# Patient Record
Sex: Female | Born: 1950 | Race: White | Hispanic: No | Marital: Married | Smoking: Never smoker
Health system: Southern US, Community
[De-identification: ages and names within clinical notes are randomized; demographics above are authoritative.]

## PROBLEM LIST (undated history)

## (undated) DIAGNOSIS — G8929 Other chronic pain: Secondary | ICD-10-CM

## (undated) DIAGNOSIS — J45909 Unspecified asthma, uncomplicated: Secondary | ICD-10-CM

## (undated) DIAGNOSIS — N3281 Overactive bladder: Secondary | ICD-10-CM

## (undated) DIAGNOSIS — J302 Other seasonal allergic rhinitis: Secondary | ICD-10-CM

## (undated) DIAGNOSIS — G4700 Insomnia, unspecified: Secondary | ICD-10-CM

## (undated) DIAGNOSIS — E119 Type 2 diabetes mellitus without complications: Secondary | ICD-10-CM

## (undated) DIAGNOSIS — M549 Dorsalgia, unspecified: Secondary | ICD-10-CM

## (undated) DIAGNOSIS — I10 Essential (primary) hypertension: Secondary | ICD-10-CM

## (undated) DIAGNOSIS — E785 Hyperlipidemia, unspecified: Secondary | ICD-10-CM

## (undated) DIAGNOSIS — K219 Gastro-esophageal reflux disease without esophagitis: Secondary | ICD-10-CM

## (undated) DIAGNOSIS — G4730 Sleep apnea, unspecified: Secondary | ICD-10-CM

## (undated) DIAGNOSIS — E039 Hypothyroidism, unspecified: Secondary | ICD-10-CM

## (undated) HISTORY — PX: COLONOSCOPY: SHX174

## (undated) HISTORY — PX: DILATION AND CURETTAGE OF UTERUS: SHX78

## (undated) HISTORY — PX: CARPAL TUNNEL RELEASE: SHX101

## (undated) HISTORY — PX: SINOSCOPY: SHX187

## (undated) HISTORY — PX: TUBAL LIGATION: SHX77

## (undated) HISTORY — PX: APPENDECTOMY: SHX54

## (undated) HISTORY — PX: TONSILLECTOMY: SUR1361

---

## 1998-05-27 ENCOUNTER — Other Ambulatory Visit: Admission: RE | Admit: 1998-05-27 | Discharge: 1998-05-27 | Payer: Self-pay | Admitting: Obstetrics and Gynecology

## 2001-07-06 HISTORY — PX: ANKLE ARTHROTOMY: SHX879

## 2002-02-25 ENCOUNTER — Encounter: Payer: Self-pay | Admitting: Orthopedic Surgery

## 2002-02-25 ENCOUNTER — Inpatient Hospital Stay (HOSPITAL_COMMUNITY): Admission: EM | Admit: 2002-02-25 | Discharge: 2002-02-27 | Payer: Self-pay | Admitting: Emergency Medicine

## 2002-02-25 ENCOUNTER — Encounter: Payer: Self-pay | Admitting: Emergency Medicine

## 2003-01-11 ENCOUNTER — Ambulatory Visit (HOSPITAL_BASED_OUTPATIENT_CLINIC_OR_DEPARTMENT_OTHER): Admission: RE | Admit: 2003-01-11 | Discharge: 2003-01-11 | Payer: Self-pay | Admitting: Orthopedic Surgery

## 2003-03-05 ENCOUNTER — Ambulatory Visit (HOSPITAL_COMMUNITY): Admission: RE | Admit: 2003-03-05 | Discharge: 2003-03-05 | Payer: Self-pay | Admitting: Gastroenterology

## 2003-10-31 ENCOUNTER — Other Ambulatory Visit: Admission: RE | Admit: 2003-10-31 | Discharge: 2003-10-31 | Payer: Self-pay | Admitting: Family Medicine

## 2004-04-16 ENCOUNTER — Encounter (INDEPENDENT_AMBULATORY_CARE_PROVIDER_SITE_OTHER): Payer: Self-pay | Admitting: *Deleted

## 2004-04-16 ENCOUNTER — Ambulatory Visit (HOSPITAL_COMMUNITY): Admission: RE | Admit: 2004-04-16 | Discharge: 2004-04-16 | Payer: Self-pay | Admitting: Obstetrics and Gynecology

## 2005-02-10 ENCOUNTER — Other Ambulatory Visit: Admission: RE | Admit: 2005-02-10 | Discharge: 2005-02-10 | Payer: Self-pay | Admitting: Family Medicine

## 2007-06-27 ENCOUNTER — Encounter: Payer: Self-pay | Admitting: Pulmonary Disease

## 2007-06-27 ENCOUNTER — Encounter: Admission: RE | Admit: 2007-06-27 | Discharge: 2007-06-27 | Payer: Self-pay | Admitting: Family Medicine

## 2007-11-18 ENCOUNTER — Ambulatory Visit: Payer: Self-pay | Admitting: Pulmonary Disease

## 2007-11-18 DIAGNOSIS — J309 Allergic rhinitis, unspecified: Secondary | ICD-10-CM | POA: Insufficient documentation

## 2007-11-18 DIAGNOSIS — I10 Essential (primary) hypertension: Secondary | ICD-10-CM | POA: Insufficient documentation

## 2007-11-18 DIAGNOSIS — R05 Cough: Secondary | ICD-10-CM | POA: Insufficient documentation

## 2007-11-18 DIAGNOSIS — R059 Cough, unspecified: Secondary | ICD-10-CM | POA: Insufficient documentation

## 2007-11-18 DIAGNOSIS — E785 Hyperlipidemia, unspecified: Secondary | ICD-10-CM | POA: Insufficient documentation

## 2007-12-06 ENCOUNTER — Telehealth: Payer: Self-pay | Admitting: Pulmonary Disease

## 2007-12-23 ENCOUNTER — Ambulatory Visit: Payer: Self-pay | Admitting: Pulmonary Disease

## 2008-07-01 ENCOUNTER — Encounter: Admission: RE | Admit: 2008-07-01 | Discharge: 2008-07-01 | Payer: Self-pay | Admitting: Family Medicine

## 2008-07-02 ENCOUNTER — Encounter: Admission: RE | Admit: 2008-07-02 | Discharge: 2008-07-04 | Payer: Self-pay | Admitting: Family Medicine

## 2008-07-06 HISTORY — PX: BACK SURGERY: SHX140

## 2008-07-09 ENCOUNTER — Encounter: Admission: RE | Admit: 2008-07-09 | Discharge: 2008-10-07 | Payer: Self-pay | Admitting: Family Medicine

## 2008-08-17 ENCOUNTER — Encounter: Admission: RE | Admit: 2008-08-17 | Discharge: 2008-08-17 | Payer: Self-pay | Admitting: Neurosurgery

## 2008-09-07 ENCOUNTER — Encounter: Admission: RE | Admit: 2008-09-07 | Discharge: 2008-09-07 | Payer: Self-pay | Admitting: Neurosurgery

## 2008-10-12 ENCOUNTER — Other Ambulatory Visit: Admission: RE | Admit: 2008-10-12 | Discharge: 2008-10-12 | Payer: Self-pay | Admitting: Obstetrics and Gynecology

## 2008-10-26 ENCOUNTER — Encounter: Admission: RE | Admit: 2008-10-26 | Discharge: 2008-10-26 | Payer: Self-pay | Admitting: Neurosurgery

## 2008-12-17 ENCOUNTER — Encounter: Payer: Self-pay | Admitting: Obstetrics and Gynecology

## 2008-12-17 ENCOUNTER — Ambulatory Visit (HOSPITAL_COMMUNITY): Admission: RE | Admit: 2008-12-17 | Discharge: 2008-12-17 | Payer: Self-pay | Admitting: Obstetrics and Gynecology

## 2009-03-01 ENCOUNTER — Ambulatory Visit (HOSPITAL_COMMUNITY): Admission: RE | Admit: 2009-03-01 | Discharge: 2009-03-02 | Payer: Self-pay | Admitting: Neurosurgery

## 2009-05-23 ENCOUNTER — Encounter: Admission: RE | Admit: 2009-05-23 | Discharge: 2009-05-23 | Payer: Self-pay | Admitting: Family Medicine

## 2010-07-27 ENCOUNTER — Encounter: Payer: Self-pay | Admitting: Neurosurgery

## 2010-10-11 LAB — PROTIME-INR: INR: 0.9 (ref 0.00–1.49)

## 2010-10-11 LAB — CBC
HCT: 42.7 % (ref 36.0–46.0)
MCHC: 34.2 g/dL (ref 30.0–36.0)
MCV: 88.7 fL (ref 78.0–100.0)
Platelets: 295 10*3/uL (ref 150–400)
RDW: 13 % (ref 11.5–15.5)

## 2010-10-11 LAB — URINALYSIS, ROUTINE W REFLEX MICROSCOPIC
Ketones, ur: NEGATIVE mg/dL
Nitrite: NEGATIVE
Specific Gravity, Urine: 1.01 (ref 1.005–1.030)
Urobilinogen, UA: 0.2 mg/dL (ref 0.0–1.0)
pH: 7 (ref 5.0–8.0)

## 2010-10-11 LAB — COMPREHENSIVE METABOLIC PANEL
Albumin: 4.1 g/dL (ref 3.5–5.2)
BUN: 8 mg/dL (ref 6–23)
Calcium: 10 mg/dL (ref 8.4–10.5)
Creatinine, Ser: 0.66 mg/dL (ref 0.4–1.2)
Total Protein: 7 g/dL (ref 6.0–8.3)

## 2010-10-11 LAB — DIFFERENTIAL
Lymphocytes Relative: 37 % (ref 12–46)
Monocytes Absolute: 0.4 10*3/uL (ref 0.1–1.0)
Monocytes Relative: 5 % (ref 3–12)
Neutro Abs: 4.3 10*3/uL (ref 1.7–7.7)

## 2010-10-11 LAB — APTT: aPTT: 25 seconds (ref 24–37)

## 2010-10-13 LAB — CBC
Hemoglobin: 14.1 g/dL (ref 12.0–15.0)
RBC: 4.55 MIL/uL (ref 3.87–5.11)
RDW: 14.6 % (ref 11.5–15.5)

## 2010-11-18 NOTE — H&P (Signed)
[name], [name]                ACCOUNT NO.:  [id number]   MEDICAL RECORD NO.:  [id number]          PATIENT TYPE:  OIB   LOCATION:  3002                         FACILITY:  [facility]   PHYSICIAN:  Payton Doughty, M.D.      DATE OF BIRTH:  01/16/1951   DATE OF ADMISSION:  03/01/2009  DATE OF DISCHARGE:                              HISTORY & PHYSICAL   ADMITTING DIAGNOSIS:  Foraminal disk on the left side at L5-S1.   BODY OF TEXT:  A very nice 60 year old right-handed white lady who has  had pain in her back down the left foot for about a year, it has been  getting worse.  MR showed a foraminal disk at 5-1.  She underwent an  epidural steroid injection, that did not help her very much.  She is  tired of fooling with it and wants her disk out.   Medical history is remarkable for hypertension and allergies.   She takes Norvasc, hydrochlorothiazide, Singulair, simvastatin, Detrol,  potassium, Lovaza, Ambien, Robaxin, Vicodin, etodolac, and a  multivitamin and has no allergies.   SURGICAL HISTORY:  1. Tonsils.  2. Appendectomy.  3. C-section.  4. Right carpal tunnel release.  5. ORIF of the right ankle.   FAMILY HISTORY:  Mom at 37 in good health.  Dad is deceased.  There is a  history of hypertension, stroke, diabetes, degenerative disk disease,  and heart disease in the family.   SOCIAL HISTORY:  She does not smoke, is a social drinker, and works as a  Engineer, site for [employer].   REVIEW OF SYSTEMS:  Remarkable for hypertension, nasal allergies,  hypercholesterolemia, leg pain, left ankle fracture, and leg weakness on  the left.   PHYSICAL EXAMINATION:  HEENT:  Within normal limits.  NECK:  She has reasonable range of motion in her neck.  CHEST:  Clear.  CARDIAC:  Regular rate and rhythm.  ABDOMEN:  Nontender with no hepatosplenomegaly.  EXTREMITIES:  Without clubbing or cyanosis.  Peripheral pulses are good.  GU:  Deferred.  NEUROLOGIC:  She is awake, alert and  oriented.  Cranial nerves are  intact.  Motor exam shows 5/5 strength throughout the upper and lower  extremities.  Sensory dysesthesia described in her right L5  distribution.  Reflexes are 2 at the knees, 2 at the left ankle, 1 at  the right.  Straight leg raise is negative.   MR shows disk at 3-4 centrally with some biforaminal narrowing and  foraminal disk at 5-1 with effacement of left L5 root.   CLINICAL IMPRESSION:  Left L5 radiculopathy related to foraminal disk at  5-1.   PLAN:  Foraminal diskectomy.  The risks and benefits have been discussed  with her, and she wished to proceed.           ______________________________  Payton Doughty, M.D.     MWR/MEDQ  D:  03/01/2009  T:  03/02/2009  Job:  [id number]

## 2010-11-18 NOTE — Op Note (Signed)
NAMEVICCI, REDER                ACCOUNT NO.:  [id number]   MEDICAL RECORD NO.:  [id number]          PATIENT TYPE:  OIB   LOCATION:  3002                         FACILITY:  [facility]   PHYSICIAN:  Payton Doughty, M.D.      DATE OF BIRTH:  08-Jan-1951   DATE OF PROCEDURE:  03/01/2009  DATE OF DISCHARGE:                               OPERATIVE REPORT   PREOPERATIVE DIAGNOSIS:  Foraminal disk on the left side at L5-S1.   POSTOPERATIVE DIAGNOSIS:  Foraminal disk on the left side at L5-S1.   OPERATIVE PROCEDURE:  Exploration of the lateral foramen at L5-S1 on the  left side.   DICTATING DOCTOR:  Payton Doughty, MD, Neurosurgery.   ANESTHESIA:  General endotracheal.   PREPARATION:  Prepped and draped with alcohol wipe.   COMPLICATIONS:  None.   NURSE ASSISTANCE:  Kathleene Hazel, RN   DOCTOR ASSISTANCE:  Hewitt Shorts, MD   BODY OF TEXT:  A 60 year old lady with left L5 radiculopathy related to  a foraminal disk at L5-S1, taken to the operative room, smoothly  anesthetized and intubated, placed prone on the operating table.  Following shave, prep, and drape in the usual sterile fashion, skin was  incised from the mid L4 to the top of S1.  The lamina of L5 was exposed  on the left side in the subperiosteal plane.  Intraoperative x-ray  confirmed correctness of level.  Having confirmed correctness of level,  the McCulloch retractor was placed and the superior portion of the L5-S1  facet joint and the lateral portion of the pars reticularis were drilled  out to the ligamentum flavum that was removed, uncovering the lateral  foramen.  The L5 root was identified with its dorsal root ganglion.  There was significant fibrous tissue associated with the joint over top  of the ganglion.  The ganglion was generously decompressed.  It could  not be mobilized superiorly and inferiorly to allow access to the disk  space.  It was therefore felt that a prudent to try and then force it,  so we were  content with the decompression.  Depo-Medrol soaked fat was  placed in the bony defect.  Successive layers of 0 Vicryl, 2-0 Vicryl,  and 3-0 nylon were used to close.  Betadine and Telfa dressing was  applied and made occlusive with OpSite and the patient returned to the  recovery room in good condition.           ______________________________  Payton Doughty, M.D.     MWR/MEDQ  D:  03/01/2009  T:  03/02/2009  Job:  [id number]

## 2010-11-18 NOTE — Op Note (Signed)
NAMEFAYETTE, [name]                ACCOUNT NO.:  [id number]   MEDICAL RECORD NO.:  [id number]          PATIENT TYPE:  AMB   LOCATION:  SDC                           [facility]AC[facility]   PHYSICIAN:  Cynthia P. Romine, M.D.DATE OF BIRTH:  [birth year]   DATE OF PROCEDURE:  12/17/2008  DATE OF DISCHARGE:  12/17/2008                               OPERATIVE REPORT   PREOPERATIVE DIAGNOSES:  Postmenopausal bleeding, uterine fibroids.   POSTOPERATIVE DIAGNOSIS:  Postmenopausal bleeding, uterine fibroids,  cath pending.   PROCEDURE:  Hysteroscopy with resection of fibroids and dilation and  curettage.   SURGEON:  Cynthia P. Romine, MD   ANESTHESIA:  General by LMA.   ESTIMATED BLOOD LOSS:  Minimal.   SORBITOL DEFICIT:  208 mL.   COMPLICATIONS:  None.   PROCEDURE:  The patient was taken to the operating room and after  induction of adequate general anesthesia was placed in dorsal lithotomy  position and prepped and draped in usual fashion.  The bladder was  drained with a red rubber catheter.  A speculum was inserted.  The  cervix was visualized and grasped with a single-tooth tenaculum both at  the anterior and posterior lips.  Paracervical block was instituted by  injecting 10 mL of 1% Xylocaine at each of 3 and 9 o'clock.  The os was  slightly stenotic and the smallest Pratt dilator was used and with  gentle pressure the external os did open.  The internal os did not seem  very stenotic.  The cervix was easily dilated to a #27 Shawnie Pons.  The 2.9-  mm diagnostic scope was introduced.  Sorbitol was used as a distention  medium.  The pressure on the pump was set at 80 mmHg.  Hysteroscopy  revealed there to be several submucous fibroids projecting into the  cavity.  No other lesions were noted.  The small scope was withdrawn.  The cervix was dilated to #31 Shawnie Pons.  The operative hysteroscope was  introduced and resection of the fibroid was carried out using a single  loop with multiple  passes.  The more pieces of the fibroid that were  removed, the more pressed into the endometrial cavity and they do  involve quite an extensive resection of fibroid.  When the deficit got  to near 300 mL, it was felt that the procedure should be terminated.  The endometrial cavity was reasonably clean.  The scope was withdrawn.  Gentle sharp curettage was done.  The specimen was sent to Pathology.  The instruments removed from the vagina and the procedure was  terminated.  The patient tolerated it well and went in satisfactory  condition to post anesthesia recovery.      Cynthia P. Romine, M.D.  Electronically Signed    CPR/MEDQ  D:  12/17/2008  T:  12/18/2008  Job:  [id number]

## 2010-11-21 NOTE — Op Note (Signed)
NAME:  [name], [name]                          ACCOUNT NO.:  [id number]   MEDICAL RECORD NO.:  [id number]                   PATIENT TYPE:  INP   LOCATION:  1824                                 FACILITY:  [facility]   PHYSICIAN:  Nadara Mustard, M.D.                DATE OF BIRTH:  1951-04-05   DATE OF PROCEDURE:  02/25/2002  DATE OF DISCHARGE:                                 OPERATIVE REPORT   PREOPERATIVE DIAGNOSIS:  Dislocated left trimalleolar level B left ankle  fracture.   POSTOPERATIVE DIAGNOSIS:  Dislocated left trimalleolar level B left ankle  fracture.   PROCEDURE:  Open reduction and internal fixation of the left ankle with a  locking Synthes one third tubular plate.   SURGEON:  Nadara Mustard, M.D.   ANESTHESIA:  General.   ESTIMATED BLOOD LOSS:  Minimal.   ANTIBIOTICS:  One gram of Kefzol.   TOURNIQUET TIME:  Esmarch to the ankle for approximately 31 minutes.   DRESSING:  Mild compressive dressing.   DISPOSITION:  To PACU in stable condition.   INDICATIONS FOR PROCEDURE:  The patient is a 60 year-old woman with an  isolated dislocated trimalleolar left ankle fracture who presents at this  time for surgical intervention. The risks and benefits were discussed. The  patient states that she understands and wishes to proceed at this time.   DESCRIPTION OF PROCEDURE:  The patient was brought to the OR room 15 and  underwent a general anesthetic.  After an adequate level of anesthesia was  obtained, the patient's left lower extremity was prepped using Duraprep and  draped into a sterile field with Ioban covering all exposed skin. An Esmarch  was wrapped around the proximal calf for tourniquet control. A lateral  incision was made and this was carried down to bone. The fracture edges were  freshened.  The wound was irrigated with normal saline. The fracture was  reduced and a lag screw was placed perpendicular to the fracture.  A one  third tubular locking plate was  then placed posterolateral as an anti-glide  plate with two locking screws distally and three locking screws proximally.  C-arm fluoroscopy verified reduction in the AP and lateral planes.  The  medial malleolar fracture was non-displaced and no hardware was placed.  The  posterior malleolar fragment was less than 25% and this reduced essentially  anatomically and there was no hardware placed in the posterior malleolar  fragment.  The wound was again irrigated.  The subcutaneous was closed using  2-0  Vicryl.  The skin was closed using approximated staples.  The wound was  covered with Adaptic orthopedic sponges and a compressive dressing was  applied. The patient was extubated and taken to the PACU in stable  condition.  Plans are for physical therapy and 23 hour observation.  Nadara Mustard, M.D.    MVD/MEDQ  D:  02/25/2002  T:  02/28/2002  Job:  [id number]

## 2010-11-21 NOTE — Op Note (Signed)
[name], [name]                ACCOUNT NO.:  [id number]   MEDICAL RECORD NO.:  [id number]          PATIENT TYPE:  AMB   LOCATION:  SDC                           [facility]   PHYSICIAN:  Cynthia P. Romine, M.D.DATE OF BIRTH:  1950-12-16   DATE OF PROCEDURE:  04/16/2004  DATE OF DISCHARGE:                                 OPERATIVE REPORT   PREOPERATIVE DIAGNOSES:  1.  Postmenopausal bleeding.  2.  Known multiple uterine fibroids.  3.  One large submucous myoma.   POSTOPERATIVE DIAGNOSES:  1.  Postmenopausal bleeding.  2.  Known multiple uterine fibroids.  3.  One large submucous myoma.  4.  Pathology pending.   PROCEDURES:  Hysteroscopy with resection of submucous myoma.   SURGEON:  Dr. Arline Asp Romine   ANESTHESIA:  General by LMA.   ESTIMATED BLOOD LOSS:  Minimal.   COMPLICATIONS:  None.   SORBITOL DEFICIT:  50 mL.   DESCRIPTION OF PROCEDURE:  The patient was taken to the operating room and  after the induction of adequate general anesthesia by LMA, was placed in the  dorsal lithotomy position and prepped and draped in the usual fashion.  A  posterior weighted and anterior Simms retractor were placed.  The cervix was  grasped on its anterior and posterior lips with a single-tooth tenaculum.  The cervix was slightly stenotic.  Lacrimal duct probes were used to locate  the endocervical canal, and then the cervix was dilated with Shawnie Pons dilators.  The uterus sounded to 10 cm.  The cervix was dilated to a #31 Pratt.  The  operative hysteroscope was introduced.  The endometrial cavity was examined.  There noted to be several polyps and a large posterior submucous myoma in  the lower uterine segment.  It has been previously seen on sonohysterogram.  A single loop was carried out with cautery to resect the myomas.  Photographic documentation was taken of the cavity before and after the  procedure.  Upon completion of the  resection, coagulation was achieved with the loop  at any bleeding site, and  the instrument was withdrawn from the uterus.  The instruments were removed  from the vagina.  The cervix was inspected and was hemostatic, and the  procedure was terminated.  The patient tolerated it well, went in  satisfactory condition to postanesthesia recovery.      CPR/MEDQ  D:  04/16/2004  T:  04/16/2004  Job:  [id number]

## 2010-11-21 NOTE — Op Note (Signed)
   NAME:  [name], [name]                          ACCOUNT NO.:  [id number]   MEDICAL RECORD NO.:  [id number]                   PATIENT TYPE:  AMB   LOCATION:  ENDO                                 FACILITY:  [facility]   PHYSICIAN:  James L. Malon Kindle., M.D.          DATE OF BIRTH:  Jul 09, 1950   DATE OF PROCEDURE:  03/05/2003  DATE OF DISCHARGE:                                 OPERATIVE REPORT   PROCEDURE PERFORMED:  Colonoscopy.   ENDOSCOPIST:  Llana Aliment. Edwards, M.D.   MEDICATIONS:  Fentanyl 50 mcg, Versed 5 mg IV.   INDICATIONS FOR PROCEDURE:  Rectal bleeding in a woman with a strong family  history of colon polyps.   DESCRIPTION OF PROCEDURE:  The procedure had been explained to the patient  and consent obtained.  With the patient in the left lateral decubitus  position, the Olympus scope was inserted and advanced under direct  visualization.  The prep was excellent and we were able to advance easily to  the cecum.  The ileocecal valve and appendiceal orifice were seen.  With the  patient in the left lateral decubitus position, the ascending colon,  transverse, descending and sigmoid colon were seen well upon removal.  No  significant diverticulosis, no polyps were seen.  Completely normal.  The  scope was withdrawn to the rectum and no polyps were seen.  In the retroflex  view, the patient was seen to have moderate internal hemorrhoids.  The  patient tolerated the procedure well, maintained on low-flow oxygen and  pulse oximeter.   ASSESSMENT:  1. Rectal bleeding, probably due to internal hemorrhoids.  2. Strong family history of colon polyps.   PLAN:  Will give a hemorrhoid instruction sheet, check yearly Hemoccults and  will recommend repeating the procedure in five years.                                               James L. Malon Kindle., M.D.    Waldron Session  D:  03/05/2003  T:  03/05/2003  Job:  [id number]   cc:   Duncan Dull, M.D.  [address]   [place]  [place] [address]  Fax: [phone]

## 2010-11-21 NOTE — Op Note (Signed)
   NAME:  [name], [name]                          ACCOUNT NO.:  [id number]   MEDICAL RECORD NO.:  [id number]                   PATIENT TYPE:  AMB   LOCATION:  DSC                                  FACILITY:  [facility]   PHYSICIAN:  Cindee Salt, M.D.                    DATE OF BIRTH:  10-26-1950   DATE OF PROCEDURE:  01/11/2003  DATE OF DISCHARGE:                                 OPERATIVE REPORT   PREOPERATIVE DIAGNOSIS:  Carpal tunnel syndrome, right hand.   POSTOPERATIVE DIAGNOSE:  Carpal tunnel syndrome, right hand.   OPERATION:  Decompression right median nerve.   SURGEON:  Nicki Reaper, M.D.   ASSISTANT:  Joaquin Courts, R.N.   ANESTHESIA:  Forearm-based IV regional.   HISTORY:  The patient is a 60 year old female with a history of carpal  tunnel syndrome. EMG nerve conduction is positive which has not responded to  conservative treatment.   DESCRIPTION OF PROCEDURE:  The patient was brought to the operating room  where a forearm-based IV regional anesthetic was carried out without  difficulty.  She was prepped using Betadine scrub and solution with the  right arm free.  A longitudinal incision was made in the palm, carried down  through subcutaneous tissue.  Bleeders were electrocauterized.  The palmar  fascia was split, the superficial palmar arch identified.  The flexor tendon  to the ring and little finger identified to the ulnar side of the median  nerve.  The carpal retinaculum was incised with sharp dissection.  A right-  angle and Sewall retractor were placed between skin and forearm fascia.  The  fascia was released for approximately 3 cm proximal to the wrist crease  under direct vision.  Canal was explored.  Tenosynovial tissue was  thickened.  No further lesions were identified.  The wound was irrigated.  Skin was closed with interrupted 5-0 nylon sutures.  A sterile compressive  dressing and splint was applied.  The patient tolerated the procedure well  and was taken  to the recovery room for observation in satisfactory  condition.  She is discharged home to return to the hand center in  [place] in one week on Vicodin and Keflex.                                                   Cindee Salt, M.D.    Angelique Blonder  D:  01/11/2003  T:  01/11/2003  Job:  [id number]

## 2010-11-21 NOTE — H&P (Signed)
   NAME:  [name], [name]                          ACCOUNT NO.:  [id number]   MEDICAL RECORD NO.:  [id number]                   PATIENT TYPE:  INP   LOCATION:  5024                                 FACILITY:  [facility]   PHYSICIAN:  Nadara Mustard, M.D.                DATE OF BIRTH:  04/16/1951   DATE OF ADMISSION:  02/25/2002  DATE OF DISCHARGE:  02/27/2002                                HISTORY & PHYSICAL   HISTORY OF PRESENT ILLNESS:  The patient is a 60 year old woman who was  descending stairs at church at a wedding, and had an inversion injury to her  left ankle.  The patient denies any other injuries.  Hence, she is brought  to the emergency room for evaluation.   PAST MEDICAL HISTORY:  1. Cesarean section x2.  2. Hypertension.   MEDICATIONS:  1. Prinivil 20 mg p.o. q.d.  2. Zoloft 50 mg p.o. q.d.   SOCIAL HISTORY:  Does not smoke.  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   REVIEW OF SYMPTOMS:  Negative.   PHYSICAL EXAMINATION:  VITAL SIGNS:  Temperature 99.3, blood pressure  140/76, pulse 86, respiratory rate 20.  GENERAL:  She is in no acute distress.  LUNGS:  Clear to auscultation.  CARDIOVASCULAR:  Regular rate and rhythm.  EXTREMITIES:  Examination of the left lower extremity:  She has a weak  diminished dorsalis pedis  pulse on the left, has a deformity to the ankle.  Does have good capillary refill.  Has diminished sensation.   LABORATORY DATA:  White blood cell count 10.6, hemoglobin 12.4.  Sodium and  electrolytes are essentially within normal limits.  Examination of the  radiographs shows a dislocated trimalleolar Weber-B left ankle fracture.   ASSESSMENT:  Dislocated trimalleolar left ankle fracture, Weber-B.   PLAN:  The patient will be scheduled for open reduction internal fixation.  The risks and benefits were discussed, including infection, neurovascular  injury, arthritis, persistent pain, need for removal of hardware.  The  patient states she understands and wishes to proceed at this time.                                                Nadara Mustard, M.D.    MVD/MEDQ  D:  02/25/2002  T:  02/28/2002  Job:  [id number]

## 2011-04-13 ENCOUNTER — Ambulatory Visit: Payer: 59 | Attending: Neurosurgery

## 2011-04-13 DIAGNOSIS — M25519 Pain in unspecified shoulder: Secondary | ICD-10-CM | POA: Insufficient documentation

## 2011-04-13 DIAGNOSIS — IMO0001 Reserved for inherently not codable concepts without codable children: Secondary | ICD-10-CM | POA: Insufficient documentation

## 2011-04-13 DIAGNOSIS — M542 Cervicalgia: Secondary | ICD-10-CM | POA: Insufficient documentation

## 2011-05-08 ENCOUNTER — Ambulatory Visit: Payer: 59 | Attending: Neurosurgery | Admitting: Physical Therapy

## 2011-05-08 DIAGNOSIS — IMO0001 Reserved for inherently not codable concepts without codable children: Secondary | ICD-10-CM | POA: Insufficient documentation

## 2011-05-08 DIAGNOSIS — M542 Cervicalgia: Secondary | ICD-10-CM | POA: Insufficient documentation

## 2011-05-08 DIAGNOSIS — M25519 Pain in unspecified shoulder: Secondary | ICD-10-CM | POA: Insufficient documentation

## 2011-05-11 ENCOUNTER — Ambulatory Visit: Payer: 59 | Admitting: Physical Therapy

## 2011-05-13 ENCOUNTER — Ambulatory Visit: Payer: 59 | Admitting: Physical Therapy

## 2011-05-18 ENCOUNTER — Ambulatory Visit: Payer: 59 | Admitting: Physical Therapy

## 2011-05-26 ENCOUNTER — Ambulatory Visit: Payer: 59 | Admitting: Physical Therapy

## 2011-06-12 ENCOUNTER — Ambulatory Visit: Payer: 59 | Attending: Neurosurgery | Admitting: Physical Therapy

## 2011-06-12 DIAGNOSIS — M25519 Pain in unspecified shoulder: Secondary | ICD-10-CM | POA: Insufficient documentation

## 2011-06-12 DIAGNOSIS — M542 Cervicalgia: Secondary | ICD-10-CM | POA: Insufficient documentation

## 2011-06-12 DIAGNOSIS — IMO0001 Reserved for inherently not codable concepts without codable children: Secondary | ICD-10-CM | POA: Insufficient documentation

## 2011-06-26 ENCOUNTER — Ambulatory Visit: Payer: 59

## 2012-07-25 ENCOUNTER — Other Ambulatory Visit: Payer: Self-pay | Admitting: Orthopedic Surgery

## 2012-07-25 DIAGNOSIS — M545 Low back pain, unspecified: Secondary | ICD-10-CM

## 2012-07-25 DIAGNOSIS — R531 Weakness: Secondary | ICD-10-CM

## 2012-07-30 ENCOUNTER — Ambulatory Visit
Admission: RE | Admit: 2012-07-30 | Discharge: 2012-07-30 | Disposition: A | Discharge status: Home / Self Care | Payer: 59 | Source: Ambulatory Visit | Attending: Orthopedic Surgery | Admitting: Orthopedic Surgery

## 2012-07-30 DIAGNOSIS — M545 Low back pain, unspecified: Secondary | ICD-10-CM

## 2012-07-30 DIAGNOSIS — R531 Weakness: Secondary | ICD-10-CM

## 2013-01-26 ENCOUNTER — Ambulatory Visit (HOSPITAL_BASED_OUTPATIENT_CLINIC_OR_DEPARTMENT_OTHER): Payer: 59

## 2013-03-01 ENCOUNTER — Ambulatory Visit: Payer: 59 | Attending: Family Medicine

## 2013-03-01 DIAGNOSIS — IMO0001 Reserved for inherently not codable concepts without codable children: Secondary | ICD-10-CM | POA: Insufficient documentation

## 2013-03-01 DIAGNOSIS — M2560 Stiffness of unspecified joint, not elsewhere classified: Secondary | ICD-10-CM | POA: Insufficient documentation

## 2013-03-01 DIAGNOSIS — M542 Cervicalgia: Secondary | ICD-10-CM | POA: Insufficient documentation

## 2013-03-01 DIAGNOSIS — R5381 Other malaise: Secondary | ICD-10-CM | POA: Insufficient documentation

## 2013-03-01 DIAGNOSIS — M25519 Pain in unspecified shoulder: Secondary | ICD-10-CM | POA: Insufficient documentation

## 2013-03-02 ENCOUNTER — Ambulatory Visit: Payer: 59 | Admitting: Physical Therapy

## 2013-03-03 ENCOUNTER — Ambulatory Visit: Payer: 59 | Admitting: Physical Therapy

## 2013-03-07 ENCOUNTER — Ambulatory Visit: Payer: 59 | Attending: Family Medicine | Admitting: Physical Therapy

## 2013-03-07 DIAGNOSIS — R5381 Other malaise: Secondary | ICD-10-CM | POA: Insufficient documentation

## 2013-03-07 DIAGNOSIS — IMO0001 Reserved for inherently not codable concepts without codable children: Secondary | ICD-10-CM | POA: Insufficient documentation

## 2013-03-07 DIAGNOSIS — M25519 Pain in unspecified shoulder: Secondary | ICD-10-CM | POA: Insufficient documentation

## 2013-03-07 DIAGNOSIS — M2560 Stiffness of unspecified joint, not elsewhere classified: Secondary | ICD-10-CM | POA: Insufficient documentation

## 2013-03-07 DIAGNOSIS — M542 Cervicalgia: Secondary | ICD-10-CM | POA: Insufficient documentation

## 2013-03-08 ENCOUNTER — Ambulatory Visit: Payer: 59

## 2013-03-09 ENCOUNTER — Ambulatory Visit: Payer: 59 | Admitting: Physical Therapy

## 2013-03-24 ENCOUNTER — Ambulatory Visit: Payer: 59 | Admitting: Physical Therapy

## 2013-03-27 ENCOUNTER — Ambulatory Visit: Payer: 59 | Admitting: Physical Therapy

## 2013-03-31 ENCOUNTER — Ambulatory Visit: Payer: 59 | Admitting: Physical Therapy

## 2013-04-07 ENCOUNTER — Ambulatory Visit: Payer: 59 | Attending: Family Medicine | Admitting: Physical Therapy

## 2013-04-07 DIAGNOSIS — M2560 Stiffness of unspecified joint, not elsewhere classified: Secondary | ICD-10-CM | POA: Insufficient documentation

## 2013-04-07 DIAGNOSIS — IMO0001 Reserved for inherently not codable concepts without codable children: Secondary | ICD-10-CM | POA: Insufficient documentation

## 2013-04-07 DIAGNOSIS — M25519 Pain in unspecified shoulder: Secondary | ICD-10-CM | POA: Insufficient documentation

## 2013-04-07 DIAGNOSIS — R5381 Other malaise: Secondary | ICD-10-CM | POA: Insufficient documentation

## 2013-04-07 DIAGNOSIS — M542 Cervicalgia: Secondary | ICD-10-CM | POA: Insufficient documentation

## 2013-04-10 ENCOUNTER — Ambulatory Visit: Payer: 59

## 2013-04-21 ENCOUNTER — Ambulatory Visit: Payer: 59 | Admitting: Physical Therapy

## 2013-04-28 ENCOUNTER — Other Ambulatory Visit (HOSPITAL_COMMUNITY)
Admission: RE | Admit: 2013-04-28 | Discharge: 2013-04-28 | Disposition: A | Discharge status: Home / Self Care | Payer: 59 | Source: Ambulatory Visit | Attending: Family Medicine | Admitting: Family Medicine

## 2013-04-28 ENCOUNTER — Other Ambulatory Visit: Payer: Self-pay | Admitting: Family Medicine

## 2013-04-28 DIAGNOSIS — Z124 Encounter for screening for malignant neoplasm of cervix: Secondary | ICD-10-CM | POA: Insufficient documentation

## 2013-05-05 ENCOUNTER — Ambulatory Visit (HOSPITAL_BASED_OUTPATIENT_CLINIC_OR_DEPARTMENT_OTHER): Payer: 59 | Attending: Otolaryngology

## 2013-05-05 VITALS — Ht 68.0 in | Wt 226.0 lb

## 2013-05-05 DIAGNOSIS — R5381 Other malaise: Secondary | ICD-10-CM | POA: Insufficient documentation

## 2013-05-05 DIAGNOSIS — R0609 Other forms of dyspnea: Secondary | ICD-10-CM | POA: Insufficient documentation

## 2013-05-05 DIAGNOSIS — R5383 Other fatigue: Secondary | ICD-10-CM | POA: Insufficient documentation

## 2013-05-05 DIAGNOSIS — R259 Unspecified abnormal involuntary movements: Secondary | ICD-10-CM | POA: Insufficient documentation

## 2013-05-05 DIAGNOSIS — G4733 Obstructive sleep apnea (adult) (pediatric): Secondary | ICD-10-CM

## 2013-05-05 DIAGNOSIS — R0989 Other specified symptoms and signs involving the circulatory and respiratory systems: Secondary | ICD-10-CM | POA: Insufficient documentation

## 2013-05-05 DIAGNOSIS — R6889 Other general symptoms and signs: Secondary | ICD-10-CM | POA: Insufficient documentation

## 2013-05-06 DIAGNOSIS — G4733 Obstructive sleep apnea (adult) (pediatric): Secondary | ICD-10-CM

## 2013-07-04 ENCOUNTER — Other Ambulatory Visit: Payer: Self-pay | Admitting: Orthopedic Surgery

## 2013-07-28 ENCOUNTER — Encounter (HOSPITAL_BASED_OUTPATIENT_CLINIC_OR_DEPARTMENT_OTHER)
Admission: RE | Admit: 2013-07-28 | Discharge: 2013-07-28 | Disposition: A | Discharge status: Home / Self Care | Payer: 59 | Source: Ambulatory Visit | Attending: Orthopedic Surgery | Admitting: Orthopedic Surgery

## 2013-07-28 ENCOUNTER — Other Ambulatory Visit: Payer: Self-pay

## 2013-07-28 ENCOUNTER — Encounter (HOSPITAL_BASED_OUTPATIENT_CLINIC_OR_DEPARTMENT_OTHER): Payer: Self-pay | Admitting: *Deleted

## 2013-07-28 DIAGNOSIS — Z01812 Encounter for preprocedural laboratory examination: Secondary | ICD-10-CM | POA: Insufficient documentation

## 2013-07-28 DIAGNOSIS — Z01818 Encounter for other preprocedural examination: Secondary | ICD-10-CM | POA: Insufficient documentation

## 2013-07-28 DIAGNOSIS — Z01810 Encounter for preprocedural cardiovascular examination: Secondary | ICD-10-CM | POA: Insufficient documentation

## 2013-07-28 LAB — BASIC METABOLIC PANEL
BUN: 15 mg/dL (ref 6–23)
CO2: 27 meq/L (ref 19–32)
Calcium: 9.5 mg/dL (ref 8.4–10.5)
Chloride: 103 mEq/L (ref 96–112)
Creatinine, Ser: 0.72 mg/dL (ref 0.50–1.10)
GFR calc Af Amer: >90 mL/min (ref >90)
GFR calc non Af Amer: >90 mL/min (ref >90)
Glucose, Bld: 94 mg/dL (ref 70–99)
POTASSIUM: 4.3 meq/L (ref 3.7–5.3)
Sodium: 142 mEq/L (ref 137–147)

## 2013-07-28 NOTE — Progress Notes (Signed)
To come in for ekg-bmet To bring cpap and use post op

## 2013-08-01 ENCOUNTER — Ambulatory Visit (HOSPITAL_BASED_OUTPATIENT_CLINIC_OR_DEPARTMENT_OTHER)
Admission: RE | Admit: 2013-08-01 | Discharge: 2013-08-01 | Disposition: A | Discharge status: Home / Self Care | Payer: 59 | Source: Ambulatory Visit | Attending: Orthopedic Surgery | Admitting: Orthopedic Surgery

## 2013-08-01 ENCOUNTER — Ambulatory Visit (HOSPITAL_BASED_OUTPATIENT_CLINIC_OR_DEPARTMENT_OTHER): Payer: 59 | Admitting: Certified Registered"

## 2013-08-01 ENCOUNTER — Encounter (HOSPITAL_BASED_OUTPATIENT_CLINIC_OR_DEPARTMENT_OTHER): Payer: Self-pay | Admitting: Orthopedic Surgery

## 2013-08-01 ENCOUNTER — Encounter (HOSPITAL_BASED_OUTPATIENT_CLINIC_OR_DEPARTMENT_OTHER)
Admission: RE | Disposition: A | Discharge status: Home / Self Care | Payer: Self-pay | Source: Ambulatory Visit | Attending: Orthopedic Surgery

## 2013-08-01 ENCOUNTER — Encounter (HOSPITAL_BASED_OUTPATIENT_CLINIC_OR_DEPARTMENT_OTHER): Payer: 59 | Admitting: Certified Registered"

## 2013-08-01 DIAGNOSIS — G5600 Carpal tunnel syndrome, unspecified upper limb: Secondary | ICD-10-CM | POA: Insufficient documentation

## 2013-08-01 DIAGNOSIS — G4730 Sleep apnea, unspecified: Secondary | ICD-10-CM | POA: Insufficient documentation

## 2013-08-01 DIAGNOSIS — E785 Hyperlipidemia, unspecified: Secondary | ICD-10-CM | POA: Insufficient documentation

## 2013-08-01 DIAGNOSIS — K219 Gastro-esophageal reflux disease without esophagitis: Secondary | ICD-10-CM | POA: Insufficient documentation

## 2013-08-01 DIAGNOSIS — I10 Essential (primary) hypertension: Secondary | ICD-10-CM | POA: Insufficient documentation

## 2013-08-01 DIAGNOSIS — E119 Type 2 diabetes mellitus without complications: Secondary | ICD-10-CM | POA: Insufficient documentation

## 2013-08-01 DIAGNOSIS — E039 Hypothyroidism, unspecified: Secondary | ICD-10-CM | POA: Insufficient documentation

## 2013-08-01 HISTORY — DX: Overactive bladder: N32.81

## 2013-08-01 HISTORY — DX: Insomnia, unspecified: G47.00

## 2013-08-01 HISTORY — DX: Dorsalgia, unspecified: M54.9

## 2013-08-01 HISTORY — DX: Type 2 diabetes mellitus without complications: E11.9

## 2013-08-01 HISTORY — DX: Other chronic pain: G89.29

## 2013-08-01 HISTORY — DX: Hyperlipidemia, unspecified: E78.5

## 2013-08-01 HISTORY — DX: Sleep apnea, unspecified: G47.30

## 2013-08-01 HISTORY — DX: Gastro-esophageal reflux disease without esophagitis: K21.9

## 2013-08-01 HISTORY — PX: CARPAL TUNNEL RELEASE: SHX101

## 2013-08-01 HISTORY — DX: Other seasonal allergic rhinitis: J30.2

## 2013-08-01 HISTORY — DX: Essential (primary) hypertension: I10

## 2013-08-01 LAB — POCT HEMOGLOBIN-HEMACUE: HEMOGLOBIN: 14.3 g/dL (ref 12.0–15.0)

## 2013-08-01 SURGERY — CARPAL TUNNEL RELEASE
Anesthesia: General | Site: Hand | Laterality: Left

## 2013-08-01 MED ORDER — CHLORHEXIDINE GLUCONATE 4 % EX LIQD: 60.0000 mL | Freq: Once | CUTANEOUS | Status: DC

## 2013-08-01 MED ORDER — MIDAZOLAM HCL 2 MG/2ML IJ SOLN: 1.0000 mg | INTRAMUSCULAR | Status: DC | PRN

## 2013-08-01 MED ORDER — OXYCODONE HCL 5 MG PO TABS
5.0000 mg | ORAL_TABLET | Freq: Once | ORAL | Status: AC | PRN
  Administered 2013-08-01: 5 mg via ORAL
  Filled 2013-08-01: qty 1

## 2013-08-01 MED ORDER — PROMETHAZINE HCL 25 MG/ML IJ SOLN: 6.2500 mg | INTRAMUSCULAR | Status: DC | PRN

## 2013-08-01 MED ORDER — CEFAZOLIN SODIUM-DEXTROSE 2-3 GM-% IV SOLR
2.0000 g | INTRAVENOUS | Status: AC
  Administered 2013-08-01: 2 g via INTRAVENOUS

## 2013-08-01 MED ORDER — DEXAMETHASONE SODIUM PHOSPHATE 10 MG/ML IJ SOLN
INTRAMUSCULAR | Status: DC | PRN
  Administered 2013-08-01: 4 mg via INTRAVENOUS

## 2013-08-01 MED ORDER — MIDAZOLAM HCL 2 MG/2ML IJ SOLN
INTRAMUSCULAR | Status: AC
  Filled 2013-08-01: qty 2

## 2013-08-01 MED ORDER — FENTANYL CITRATE 0.05 MG/ML IJ SOLN: 50.0000 ug | Freq: Once | INTRAMUSCULAR | Status: DC

## 2013-08-01 MED ORDER — LACTATED RINGERS IV SOLN
INTRAVENOUS | Status: DC
  Administered 2013-08-01: 08:00:00 via INTRAVENOUS

## 2013-08-01 MED ORDER — MIDAZOLAM HCL 5 MG/5ML IJ SOLN
INTRAMUSCULAR | Status: DC | PRN
  Administered 2013-08-01: 1 mg via INTRAVENOUS

## 2013-08-01 MED ORDER — HYDROCODONE-ACETAMINOPHEN 5-325 MG PO TABS: 1.0000 | ORAL_TABLET | Freq: Four times a day (QID) | ORAL | Status: DC | PRN

## 2013-08-01 MED ORDER — FENTANYL CITRATE 0.05 MG/ML IJ SOLN
25.0000 ug | INTRAMUSCULAR | Status: DC | PRN
  Administered 2013-08-01 (×3): 25 ug via INTRAVENOUS

## 2013-08-01 MED ORDER — FENTANYL CITRATE 0.05 MG/ML IJ SOLN
INTRAMUSCULAR | Status: AC
  Filled 2013-08-01: qty 4

## 2013-08-01 MED ORDER — FENTANYL CITRATE 0.05 MG/ML IJ SOLN
50.0000 ug | INTRAMUSCULAR | Status: DC | PRN
Start: 2013-08-01 — End: 2013-08-01

## 2013-08-01 MED ORDER — BUPIVACAINE HCL (PF) 0.25 % IJ SOLN
INTRAMUSCULAR | Status: DC | PRN
  Administered 2013-08-01: 6.5 mL

## 2013-08-01 MED ORDER — OXYCODONE HCL 5 MG/5ML PO SOLN: 5.0000 mg | Freq: Once | ORAL | Status: AC | PRN

## 2013-08-01 MED ORDER — FENTANYL CITRATE 0.05 MG/ML IJ SOLN
INTRAMUSCULAR | Status: AC
  Filled 2013-08-01: qty 2

## 2013-08-01 MED ORDER — 0.9 % SODIUM CHLORIDE (POUR BTL) OPTIME
TOPICAL | Status: DC | PRN
  Administered 2013-08-01: 200 mL

## 2013-08-01 MED ORDER — LIDOCAINE HCL (CARDIAC) 20 MG/ML IV SOLN
INTRAVENOUS | Status: DC | PRN
  Administered 2013-08-01: 60 mg via INTRAVENOUS

## 2013-08-01 MED ORDER — BUPIVACAINE HCL (PF) 0.25 % IJ SOLN
INTRAMUSCULAR | Status: AC
  Filled 2013-08-01: qty 30

## 2013-08-01 MED ORDER — ONDANSETRON HCL 4 MG/2ML IJ SOLN
INTRAMUSCULAR | Status: DC | PRN
  Administered 2013-08-01: 4 mg via INTRAVENOUS

## 2013-08-01 MED ORDER — MIDAZOLAM HCL 2 MG/2ML IJ SOLN
1.0000 mg | INTRAMUSCULAR | Status: DC | PRN
Start: 2013-08-01 — End: 2013-08-01

## 2013-08-01 MED ORDER — PROPOFOL 10 MG/ML IV BOLUS
INTRAVENOUS | Status: DC | PRN
  Administered 2013-08-01: 200 mg via INTRAVENOUS

## 2013-08-01 MED ORDER — PROPOFOL 10 MG/ML IV EMUL
INTRAVENOUS | Status: AC
  Filled 2013-08-01: qty 150

## 2013-08-01 MED ORDER — FENTANYL CITRATE 0.05 MG/ML IJ SOLN
INTRAMUSCULAR | Status: DC | PRN
  Administered 2013-08-01: 50 ug via INTRAVENOUS
  Administered 2013-08-01: 25 ug via INTRAVENOUS
  Administered 2013-08-01: 50 ug via INTRAVENOUS

## 2013-08-01 MED ORDER — CEFAZOLIN SODIUM-DEXTROSE 2-3 GM-% IV SOLR
INTRAVENOUS | Status: AC
  Filled 2013-08-01: qty 50

## 2013-08-01 SURGICAL SUPPLY — 41 items
BLADE SURG 15 STRL LF DISP TIS (BLADE) ×1 IMPLANT
BLADE SURG 15 STRL SS (BLADE) ×2
BNDG CMPR 9X4 STRL LF SNTH (GAUZE/BANDAGES/DRESSINGS) ×1
BNDG COHESIVE 3X5 TAN STRL LF (GAUZE/BANDAGES/DRESSINGS) ×2 IMPLANT
BNDG ESMARK 4X9 LF (GAUZE/BANDAGES/DRESSINGS) ×1 IMPLANT
BNDG GAUZE ELAST 4 BULKY (GAUZE/BANDAGES/DRESSINGS) ×2 IMPLANT
CHLORAPREP W/TINT 26ML (MISCELLANEOUS) ×2 IMPLANT
CORDS BIPOLAR (ELECTRODE) ×2 IMPLANT
COVER MAYO STAND STRL (DRAPES) ×2 IMPLANT
COVER TABLE BACK 60X90 (DRAPES) ×2 IMPLANT
CUFF TOURNIQUET SINGLE 18IN (TOURNIQUET CUFF) ×2 IMPLANT
DRAPE EXTREMITY T 121X128X90 (DRAPE) ×2 IMPLANT
DRAPE SURG 17X23 STRL (DRAPES) ×2 IMPLANT
DRSG KUZMA FLUFF (GAUZE/BANDAGES/DRESSINGS) IMPLANT
GAUZE XEROFORM 1X8 LF (GAUZE/BANDAGES/DRESSINGS) ×2 IMPLANT
GLOVE BIO SURGEON STRL SZ 6.5 (GLOVE) ×1 IMPLANT
GLOVE BIOGEL PI IND STRL 7.0 (GLOVE) IMPLANT
GLOVE BIOGEL PI IND STRL 8.5 (GLOVE) ×1 IMPLANT
GLOVE BIOGEL PI INDICATOR 7.0 (GLOVE) ×1
GLOVE BIOGEL PI INDICATOR 8.5 (GLOVE) ×1
GLOVE EXAM NITRILE LRG STRL (GLOVE) ×1 IMPLANT
GLOVE SURG ORTHO 8.0 STRL STRW (GLOVE) ×2 IMPLANT
GOWN STRL REUS W/ TWL LRG LVL3 (GOWN DISPOSABLE) ×1 IMPLANT
GOWN STRL REUS W/TWL LRG LVL3 (GOWN DISPOSABLE) ×2
GOWN STRL REUS W/TWL XL LVL3 (GOWN DISPOSABLE) ×2 IMPLANT
NEEDLE 27GAX1X1/2 (NEEDLE) ×1 IMPLANT
NS IRRIG 1000ML POUR BTL (IV SOLUTION) ×2 IMPLANT
PACK BASIN DAY SURGERY FS (CUSTOM PROCEDURE TRAY) ×2 IMPLANT
PAD ABD 8X10 STRL (GAUZE/BANDAGES/DRESSINGS) IMPLANT
PAD CAST 3X4 CTTN HI CHSV (CAST SUPPLIES) ×1 IMPLANT
PADDING CAST ABS 4INX4YD NS (CAST SUPPLIES) ×1
PADDING CAST ABS COTTON 4X4 ST (CAST SUPPLIES) ×1 IMPLANT
PADDING CAST COTTON 3X4 STRL (CAST SUPPLIES) ×2
SPONGE GAUZE 4X4 12PLY (GAUZE/BANDAGES/DRESSINGS) ×2 IMPLANT
STOCKINETTE 4X48 STRL (DRAPES) ×2 IMPLANT
SUT VICRYL 4-0 PS2 18IN ABS (SUTURE) IMPLANT
SUT VICRYL RAPIDE 4/0 PS 2 (SUTURE) ×2 IMPLANT
SYR BULB 3OZ (MISCELLANEOUS) ×2 IMPLANT
SYR CONTROL 10ML LL (SYRINGE) ×1 IMPLANT
TOWEL OR 17X24 6PK STRL BLUE (TOWEL DISPOSABLE) ×2 IMPLANT
UNDERPAD 30X30 INCONTINENT (UNDERPADS AND DIAPERS) ×2 IMPLANT

## 2013-08-01 NOTE — Discharge Instructions (Addendum)

## 2013-08-01 NOTE — Transfer of Care (Signed)
Immediate Anesthesia Transfer of Care Note  Patient: [name]  Procedure(s) Performed: Procedure(s): LEFT CARPAL TUNNEL RELEASE (Left)  Patient Location: PACU  Anesthesia Type:General  Level of Consciousness: awake, alert , oriented and patient cooperative  Airway & Oxygen Therapy: Patient Spontanous Breathing and Patient connected to face mask oxygen  Post-op Assessment: Report given to PACU RN and Post -op Vital signs reviewed and stable  Post vital signs: Reviewed and stable  Complications: No apparent anesthesia complications

## 2013-08-01 NOTE — Anesthesia Procedure Notes (Signed)
Procedure Name: LMA Insertion Date/Time: 08/01/2013 8:37 AM Performed by: Margalit Leece Pre-anesthesia Checklist: Patient identified, Emergency Drugs available, Suction available and Patient being monitored Patient Re-evaluated:Patient Re-evaluated prior to inductionOxygen Delivery Method: Circle System Utilized Preoxygenation: Pre-oxygenation with 100% oxygen Intubation Type: IV induction Ventilation: Mask ventilation without difficulty LMA: LMA inserted LMA Size: 4.0 Number of attempts: 1 Airway Equipment and Method: bite block Placement Confirmation: positive ETCO2 Tube secured with: Tape Dental Injury: Teeth and Oropharynx as per pre-operative assessment
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
## 2013-08-01 NOTE — Anesthesia Preprocedure Evaluation (Addendum)
Anesthesia Evaluation  Patient identified by MRN, date of birth, ID band Patient awake    Reviewed: Allergy & Precautions, H&P , NPO status , Patient's Chart, lab work & pertinent test results  Airway Mallampati: II TM Distance: >3 FB Neck ROM: Full    Dental   Pulmonary sleep apnea ,  breath sounds clear to auscultation        Cardiovascular hypertension, Pt. on medications Rhythm:Regular Rate:Normal     Neuro/Psych  Neuromuscular disease    GI/Hepatic GERD-  Medicated,  Endo/Other  diabetes, Type 2, Oral Hypoglycemic AgentsHyperthyroidism   Renal/GU      Musculoskeletal   Abdominal (+) + obese,   Peds  Hematology   Anesthesia Other Findings   Reproductive/Obstetrics                          Anesthesia Physical Anesthesia Plan  ASA: III  Anesthesia Plan: General   Post-op Pain Management:    Induction: Intravenous  Airway Management Planned: LMA  Additional Equipment:   Intra-op Plan:   Post-operative Plan: Extubation in OR  Informed Consent: I have reviewed the patients History and Physical, chart, labs and discussed the procedure including the risks, benefits and alternatives for the proposed anesthesia with the patient or authorized representative who has indicated his/her understanding and acceptance.     Plan Discussed with: CRNA and Surgeon  Anesthesia Plan Comments:         Anesthesia Quick Evaluation

## 2013-08-01 NOTE — Op Note (Signed)
Dictation Number [id number]

## 2013-08-01 NOTE — Anesthesia Postprocedure Evaluation (Signed)
  Anesthesia Post-op Note  Patient: [name]  Procedure(s) Performed: Procedure(s): LEFT CARPAL TUNNEL RELEASE (Left)  Patient Location: PACU  Anesthesia Type:General  Level of Consciousness: awake  Airway and Oxygen Therapy: Patient Spontanous Breathing  Post-op Pain: mild  Post-op Assessment: Post-op Vital signs reviewed, Patient's Cardiovascular Status Stable, Respiratory Function Stable, Patent Airway, No signs of Nausea or vomiting and Pain level controlled  Post-op Vital Signs: Reviewed and stable  Complications: No apparent anesthesia complications

## 2013-08-01 NOTE — Brief Op Note (Signed)
08/01/2013  9:10 AM  PATIENT:  [name]  63 y.o. female  PRE-OPERATIVE DIAGNOSIS:  LEFT CARPAL TUNNEL SYNDROME  POST-OPERATIVE DIAGNOSIS:  LEFT CARPAL TUNNEL SYNDROME  PROCEDURE:  Procedure(s): LEFT CARPAL TUNNEL RELEASE (Left)  SURGEON:  Surgeon(s) and Role:    * Wynonia Sours, MD - Primary  PHYSICIAN ASSISTANT:   ASSISTANTS: none   ANESTHESIA:   local and general  EBL:  Total I/O In: 700 [I.V.:700] Out: -   BLOOD ADMINISTERED:none  DRAINS: none   LOCAL MEDICATIONS USED:  BUPIVICAINE   SPECIMEN:  No Specimen  DISPOSITION OF SPECIMEN:  N/A  COUNTS:  YES  TOURNIQUET:   Total Tourniquet Time Documented: Forearm (Left) - 12 minutes Total: Forearm (Left) - 12 minutes   DICTATION: .Other Dictation: Dictation Number [id number]  PLAN OF CARE: Discharge to home after PACU  PATIENT DISPOSITION:  PACU - hemodynamically stable.

## 2013-08-02 NOTE — Op Note (Signed)
[name], [name]                ACCOUNT NO.:  [id number]  MEDICAL RECORD NO.:  [id number]  LOCATION:                               FACILITY:  [facility]  PHYSICIAN:  Daryll Brod, M.D.       DATE OF BIRTH:  07-05-1951  DATE OF PROCEDURE:  08/01/2013 DATE OF DISCHARGE:  08/01/2013                              OPERATIVE REPORT   PREOPERATIVE DIAGNOSIS:  Carpal tunnel syndrome, left hand.  POSTOPERATIVE DIAGNOSIS:  Carpal tunnel syndrome, left hand.  OPERATION:  Decompression of left median nerve.  SURGEON:  Daryll Brod, M.D.  ANESTHESIA:  General with local infiltration.  ANESTHESIOLOGIST:  Leda Quail, M.D.  HISTORY:  The patient is a 63 year old female with history of numbness and tingling, left hand.  This has not responded to conservative treatment.  Nerve conductions are positive for carpal tunnel syndrome, left side with compression of the median nerve.  Pre, peri, and postoperative course have been discussed along with risks and complications.  She is aware that there is no guarantee with the surgery; possibility of infection; recurrence of injury to arteries, nerves, tendons; incomplete relief of symptoms; dystrophy.  In the preoperative area, the patient is seen, the extremity marked by both the patient and surgeon, and antibiotic given.  DESCRIPTION OF PROCEDURE:  The patient was brought to the operating room where a general anesthetic was carried out without difficulty under the direction of Dr. Chriss Driver.  She was prepped using ChloraPrep, supine position, left arm free.  A 3-minute dry time was allowed.  Time-out taken, confirming the patient and procedure.  The limb was exsanguinated with an Esmarch bandage.  Tourniquet placed on the upper arm was inflated to 250 mmHg.  A longitudinal incision was made in the left palm, carried down through the subcutaneous tissue.  Bleeders were electrocauterized with bipolar.  The palmar fascia was split.  The superficial palmar arch  identified.  The flexor tendon to the ring and little finger were identified.  Retractor was placed protecting the median nerve, retracting this radially and the incision was then made on the ulnar aspect of the flexor retinaculum.  A right angle and Sewall retractor were placed between the skin and forearm fascia.  The fascia was released for approximately 1.5 cm proximal to the wrist crease under direct vision.  Canal was explored.  Air compression to the nerve was apparent.  The motor branch entered into muscle distally.  The wound was irrigated.  The skin was then closed with interrupted 4-0 Vicryl Rapide sutures.  A local infiltration with 0.25% Marcaine without epinephrine, between 6 and 7 mL was used.  A sterile compressive dressing with the fingers free was applied. On deflation of the tourniquet, all fingers were immediately pinked. She was taken to the recovery room for observation in satisfactory condition.  She will be discharged to home to return in 1 week on Norco.          ______________________________ Daryll Brod, M.D.     GK/MEDQ  D:  08/01/2013  T:  08/02/2013  Job:  [id number]

## 2013-08-03 ENCOUNTER — Encounter (HOSPITAL_BASED_OUTPATIENT_CLINIC_OR_DEPARTMENT_OTHER): Payer: Self-pay | Admitting: Orthopedic Surgery

## 2013-12-13 ENCOUNTER — Other Ambulatory Visit: Payer: Self-pay | Admitting: Neurosurgery

## 2013-12-13 DIAGNOSIS — M47812 Spondylosis without myelopathy or radiculopathy, cervical region: Secondary | ICD-10-CM

## 2013-12-26 ENCOUNTER — Ambulatory Visit
Admission: RE | Admit: 2013-12-26 | Discharge: 2013-12-26 | Disposition: A | Discharge status: Home / Self Care | Payer: 59 | Source: Ambulatory Visit | Attending: Neurosurgery | Admitting: Neurosurgery

## 2013-12-26 DIAGNOSIS — M47812 Spondylosis without myelopathy or radiculopathy, cervical region: Secondary | ICD-10-CM

## 2014-03-27 ENCOUNTER — Other Ambulatory Visit: Payer: Self-pay | Admitting: Gastroenterology

## 2014-06-08 ENCOUNTER — Ambulatory Visit: Payer: 59 | Attending: Family Medicine

## 2014-06-08 DIAGNOSIS — M5020 Other cervical disc displacement, unspecified cervical region: Secondary | ICD-10-CM | POA: Diagnosis present

## 2014-06-08 NOTE — Patient Instructions (Signed)
[name] was instructed in not doing strenuous activity for the next 1-2 days to minimize after testing soreness.

## 2014-06-08 NOTE — Therapy (Signed)
Outpatient [facility] [address] [place], [place], [address] Phone: [phone]   Fax:  [phone]  Physical Therapy Evaluation  Patient Details  Name: [name] MRN: [id number] Date of Birth: 31-Mar-1951  Encounter Date: 06/08/2014      PT End of Session - 06/08/14 1159    Visit Number 1   Number of Visits 1   PT Start Time 0750   PT Stop Time 1100   PT Time Calculation (min) 190 min   Activity Tolerance Patient tolerated treatment well   Behavior During Therapy [facility] for tasks assessed/performed      Past Medical History  Diagnosis Date  . Hypertension   . GERD (gastroesophageal reflux disease)   . Diabetes mellitus without complication   . Hyperlipemia   . Chronic back pain   . Hyperthyroidism   . Seasonal allergies   . OAB (overactive bladder)   . Insomnia   . Sleep apnea     uses a cpap    Past Surgical History  Procedure Laterality Date  . Cesarean section  83,86  . Tubal ligation    . Tonsillectomy    . Appendectomy    . Carpal tunnel release      rt  . Back surgery  2010    lumb disk  . Ankle arthrotomy  2003    left-fx  . Colonoscopy    . Dilation and curettage of uterus      x2  . Carpal tunnel release Left 08/01/2013    Procedure: LEFT CARPAL TUNNEL RELEASE;  Surgeon: Wynonia Sours, MD;  Location: [facility];  Service: Orthopedics;  Laterality: Left;    There were no vitals taken for this visit.  Visit Diagnosis:  Herniated cervical disc - Plan: PT plan of care cert/re-cert      Subjective Assessment - 06/08/14 1157    Symptoms See FCE report   Currently in Pain? Yes   Pain Score 6    Pain Location Neck   Pain Orientation Right   Pain Descriptors / Indicators Radiating;Aching   Pain Type Chronic pain   Pain Onset More than a month ago   Pain Frequency Constant   Multiple Pain Sites No      The FCE was completed with Ms  Dragoo rated at light level of work.   See report faxed to  your office for specifics.              Plan - 06/08/14 1159    Clinical Impression Statement Completed test without significant increase pain   PT Next Visit Plan Discharge to MD   Recommended Other Services None   Consulted and Agree with Plan of Care Patient                              Problem List Patient Active Problem List   Diagnosis Date Noted  . HYPERLIPIDEMIA 11/18/2007  . HYPERTENSION 11/18/2007  . ALLERGIC RHINITIS 11/18/2007  . COUGH 11/18/2007    Darrel Hoover PT 06/08/2014, 12:05 PM

## 2015-05-03 ENCOUNTER — Other Ambulatory Visit: Payer: Self-pay | Admitting: Otolaryngology

## 2015-06-14 ENCOUNTER — Encounter: Payer: Self-pay | Admitting: Allergy and Immunology

## 2015-06-14 ENCOUNTER — Ambulatory Visit (INDEPENDENT_AMBULATORY_CARE_PROVIDER_SITE_OTHER): Payer: 59 | Admitting: Allergy and Immunology

## 2015-06-14 ENCOUNTER — Encounter (INDEPENDENT_AMBULATORY_CARE_PROVIDER_SITE_OTHER): Payer: Self-pay

## 2015-06-14 ENCOUNTER — Encounter (HOSPITAL_COMMUNITY)
Admission: RE | Admit: 2015-06-14 | Discharge: 2015-06-14 | Disposition: A | Discharge status: Home / Self Care | Payer: 59 | Source: Ambulatory Visit | Attending: Otolaryngology | Admitting: Otolaryngology

## 2015-06-14 ENCOUNTER — Encounter (HOSPITAL_COMMUNITY): Payer: Self-pay

## 2015-06-14 VITALS — BP 130/76 | HR 76 | Resp 16

## 2015-06-14 DIAGNOSIS — G4733 Obstructive sleep apnea (adult) (pediatric): Secondary | ICD-10-CM | POA: Diagnosis not present

## 2015-06-14 DIAGNOSIS — E785 Hyperlipidemia, unspecified: Secondary | ICD-10-CM | POA: Insufficient documentation

## 2015-06-14 DIAGNOSIS — E119 Type 2 diabetes mellitus without complications: Secondary | ICD-10-CM | POA: Diagnosis not present

## 2015-06-14 DIAGNOSIS — J342 Deviated nasal septum: Secondary | ICD-10-CM | POA: Diagnosis not present

## 2015-06-14 DIAGNOSIS — Z01812 Encounter for preprocedural laboratory examination: Secondary | ICD-10-CM | POA: Insufficient documentation

## 2015-06-14 DIAGNOSIS — Z79899 Other long term (current) drug therapy: Secondary | ICD-10-CM | POA: Diagnosis not present

## 2015-06-14 DIAGNOSIS — R059 Cough, unspecified: Secondary | ICD-10-CM

## 2015-06-14 DIAGNOSIS — I10 Essential (primary) hypertension: Secondary | ICD-10-CM | POA: Insufficient documentation

## 2015-06-14 DIAGNOSIS — J310 Chronic rhinitis: Secondary | ICD-10-CM

## 2015-06-14 DIAGNOSIS — E039 Hypothyroidism, unspecified: Secondary | ICD-10-CM | POA: Diagnosis not present

## 2015-06-14 DIAGNOSIS — J45909 Unspecified asthma, uncomplicated: Secondary | ICD-10-CM | POA: Insufficient documentation

## 2015-06-14 DIAGNOSIS — Z01818 Encounter for other preprocedural examination: Secondary | ICD-10-CM | POA: Diagnosis present

## 2015-06-14 DIAGNOSIS — R05 Cough: Secondary | ICD-10-CM

## 2015-06-14 DIAGNOSIS — K219 Gastro-esophageal reflux disease without esophagitis: Secondary | ICD-10-CM | POA: Diagnosis not present

## 2015-06-14 HISTORY — DX: Unspecified asthma, uncomplicated: J45.909

## 2015-06-14 HISTORY — DX: Hypothyroidism, unspecified: E03.9

## 2015-06-14 LAB — BASIC METABOLIC PANEL
Anion gap: 8 (ref 5–15)
BUN: 12 mg/dL (ref 6–20)
CHLORIDE: 106 mmol/L (ref 101–111)
CO2: 27 mmol/L (ref 22–32)
CREATININE: 0.73 mg/dL (ref 0.44–1.00)
Calcium: 9.6 mg/dL (ref 8.9–10.3)
Glucose, Bld: 146 mg/dL — ABNORMAL HIGH (ref 65–99)
POTASSIUM: 3.2 mmol/L — AB (ref 3.5–5.1)
SODIUM: 141 mmol/L (ref 135–145)

## 2015-06-14 LAB — CBC
HCT: 42.4 % (ref 36.0–46.0)
Hemoglobin: 13.7 g/dL (ref 12.0–15.0)
MCH: 28.2 pg (ref 26.0–34.0)
MCHC: 32.3 g/dL (ref 30.0–36.0)
MCV: 87.2 fL (ref 78.0–100.0)
PLATELETS: 238 10*3/uL (ref 150–400)
RBC: 4.86 MIL/uL (ref 3.87–5.11)
RDW: 13.7 % (ref 11.5–15.5)
WBC: 7.5 10*3/uL (ref 4.0–10.5)

## 2015-06-14 LAB — GLUCOSE, CAPILLARY: Glucose-Capillary: 120 mg/dL — ABNORMAL HIGH (ref 65–99)

## 2015-06-14 MED ORDER — BECLOMETHASONE DIPROPIONATE 40 MCG/ACT IN AERS: 2.0000 | INHALATION_SPRAY | Freq: Two times a day (BID) | RESPIRATORY_TRACT | Status: DC

## 2015-06-14 MED ORDER — AZELASTINE HCL 0.1 % NA SOLN: 2.0000 | Freq: Every evening | NASAL | Status: DC

## 2015-06-14 NOTE — Patient Instructions (Signed)
  Continue current medication regime.  Review with Dr. Wilburn Cornelia any changes to meds prior to operation.  Saline nasal wash as needed.   Follow-up in 6 months or sooner if needed.

## 2015-06-14 NOTE — Progress Notes (Signed)
FOLLOW UP NOTE  RE: [name] MRN: [id number] DOB: Jul 26, 1950 [facility] [address] [place] [address] Date of Office Visit: 06/14/2015  Subjective:  [name] is a 64 y.o. female who presents today for Sinus Problem  Assessment:   1. Cough   2. Mixed rhinitis    Plan:   Meds ordered this encounter  Medications  . beclomethasone (QVAR) 40 MCG/ACT inhaler    Sig: Inhale 2 puffs into the lungs 2 (two) times daily.    Dispense:  3 Inhaler    Refill:  1  . azelastine (ASTELIN) 0.1 % nasal spray    Sig: Place 2 sprays into both nostrils every evening. Use in each nostril as directed    Dispense:  90 mL    Refill:  1   Patient Instructions   1.  Continue current medication regime. 2.  Review with Dr. Wilburn Cornelia any changes to meds prior to operation. 3.  Saline nasal wash as needed. 4.  Follow-up in 6 months or sooner if needed.   HPI: [name] returns to the office in follow-up of mixed rhinitis and cough with her last visit in June.  She describes medication regime is beneficial in particular with the significant environmental changes that occur for her with recurring travel for work.  If she misses the meds he may note mild congestion, drainage, but is feeling very good overall.  Denies cough, wheeze, difficulty breathing, shortness of breath, acute care or emergency department visits or prednisone or antbiotics.  She is able to exercise without difficulty and feels sleep is  Good with CPAP use without recent albuterol use.  She did not feel Dymista was superior to Astelin.  She reports upcoming septoplasty with Dr. Wilburn Cornelia next week.  No other new concerns.  Current Medications: 1.  Astelin 2 sprays once daily. 2.  QVAR 2 puffs twice daily. 3.  Singulair 10 mg once daily. 4.  Continues Norvasc, Neurontin, Microzide, Cozaar, Zocor, Ambien, Synthroid Mobic, multivitamin and Prilosec daily.  Drug Allergies: No Known  Allergies  Objective:   Filed Vitals:   06/14/15 1114  BP: 130/76  Pulse: 76  Resp: 16   Physical Exam  Constitutional: She is well-developed, well-nourished, and in no distress.  HENT:  Head: Atraumatic.  Right Ear: Tympanic membrane and ear canal normal.  Left Ear: Tympanic membrane and ear canal normal.  Nose: Mucosal edema present. No rhinorrhea. No epistaxis.  Mouth/Throat: Oropharynx is clear and moist and mucous membranes are normal. No oropharyngeal exudate, posterior oropharyngeal edema or posterior oropharyngeal erythema.  Neck: Neck supple.  Cardiovascular: Normal rate, S1 normal and S2 normal.   No murmur heard. Pulmonary/Chest: Effort normal. She has no wheezes. She has no rhonchi. She has no rales.  Lymphadenopathy:    She has no cervical adenopathy.    Diagnostics: Spirometry:  FVC  2.81--80%, FEV1 2.52--98%.    Eiley Mcginnity M. Ishmael Holter, MD  cc: Marjorie Smolder, MD

## 2015-06-14 NOTE — Pre-Procedure Instructions (Addendum)
[name]  06/14/2015     No Pharmacies Listed   Your procedure is scheduled on 06/21/15.  Report to [facility] cone short stay admitting at 530 A.M.  Call this number if you have problems the morning of surgery:  [phone]   Remember:  Do not eat food or drink liquids after midnight.  Take these medicines the morning of surgery with A SIP OF WATER amlodipine, inhaler if needed,   pain med if needed, levothyroxine, zegerid   STOP all herbel meds, nsaids (aleve,naproxen,advil,ibuprofen) 5 days prior to surgery starting. 06/16/15 including vitamins, aspirin, mobic   Do not wear jewelry, make-up or nail polish.  Do not wear lotions, powders, or perfumes.  You may wear deodorant.  Do not shave 48 hours prior to surgery.  Men may shave face and neck.  Do not bring valuables to the hospital.  [facility] is not responsible for any belongings or valuables.  Contacts, dentures or bridgework may not be worn into surgery.  Leave your suitcase in the car.  After surgery it may be brought to your room.  For patients admitted to the hospital, discharge time will be determined by your treatment team.  Patients discharged the day of surgery will not be allowed to drive home.   Name and phone number of your driver:    Special instructions:   Special Instructions: [facility] - Preparing for Surgery  Before surgery, you can play an important role.  Because skin is not sterile, your skin needs to be as free of germs as possible.  You can reduce the number of germs on you skin by washing with CHG (chlorahexidine gluconate) soap before surgery.  CHG is an antiseptic cleaner which kills germs and bonds with the skin to continue killing germs even after washing.  Please DO NOT use if you have an allergy to CHG or antibacterial soaps.  If your skin becomes reddened/irritated stop using the CHG and inform your nurse when you arrive at Short Stay.  Do not shave (including legs and underarms) for at  least 48 hours prior to the first CHG shower.  You may shave your face.  Please follow these instructions carefully:   1.  Shower with CHG Soap the night before surgery and the morning of Surgery.  2.  If you choose to wash your hair, wash your hair first as usual with your normal shampoo.  3.  After you shampoo, rinse your hair and body thoroughly to remove the Shampoo.  4.  Use CHG as you would any other liquid soap.  You can apply chg directly  to the skin and wash gently with scrungie or a clean washcloth.  5.  Apply the CHG Soap to your body ONLY FROM THE NECK DOWN.  Do not use on open wounds or open sores.  Avoid contact with your eyes ears, mouth and genitals (private parts).  Wash genitals (private parts)       with your normal soap.  6.  Wash thoroughly, paying special attention to the area where your surgery will be performed.  7.  Thoroughly rinse your body with warm water from the neck down.  8.  DO NOT shower/wash with your normal soap after using and rinsing off the CHG Soap.  9.  Pat yourself dry with a clean towel.            10.  Wear clean pajamas.  11.  Place clean sheets on your bed the night of your first shower and do not sleep with pets.  Day of Surgery  Do not apply any lotions/deodorants the morning of surgery.  Please wear clean clothes to the hospital/surgery center.  Please read over the following fact sheets that you were given. Pain Booklet, Coughing and Deep Breathing and Surgical Site Infection Prevention

## 2015-06-15 LAB — HEMOGLOBIN A1C
HEMOGLOBIN A1C: 6.7 % — AB (ref 4.8–5.6)
MEAN PLASMA GLUCOSE: 146 mg/dL

## 2015-06-17 NOTE — Progress Notes (Addendum)
Anesthesia Chart Review:  Pt is 64 year old female scheduled for nasal septoplasty with B turbinate reduction on 06/21/2015 with Dr. Jerrell Belfast.   PCP is Dr. Darcus Austin.   PMH includes:  HTN, hyperlipidemia, DM, asthma, OSA, hypothyroidism, GERD. Never smoker. BMI 36. S/p L carpal tunnel release 08/01/13.   Medications include: amlodipine, qvar, hctz, levothyroxine, losartan, prilosec, simvastatin  Preoperative labs reviewed.  Glucose 146, hgbA1c 6.7.   EKG 06/14/15: NSR.   Pt saw Dr. Candee Furbish (with [facility] at the time) in 2013 for abnormal EKG. By notes, nuclear stress test performed 09/09/11 that showed no ischemia, low risk stress test.   If no changes, I anticipate pt can proceed with surgery as scheduled.   Willeen Cass, FNP-BC [facility]/Anesthesiology Phone: [phone] 06/20/2015 1:09 PM

## 2015-06-21 ENCOUNTER — Ambulatory Visit (HOSPITAL_COMMUNITY): Payer: 59 | Admitting: Emergency Medicine

## 2015-06-21 ENCOUNTER — Ambulatory Visit (HOSPITAL_COMMUNITY): Payer: 59 | Admitting: Anesthesiology

## 2015-06-21 ENCOUNTER — Ambulatory Visit (HOSPITAL_COMMUNITY)
Admission: RE | Admit: 2015-06-21 | Discharge: 2015-06-21 | Disposition: A | Discharge status: Home / Self Care | Payer: 59 | Source: Ambulatory Visit | Attending: Otolaryngology | Admitting: Otolaryngology

## 2015-06-21 ENCOUNTER — Encounter (HOSPITAL_COMMUNITY)
Admission: RE | Disposition: A | Discharge status: Home / Self Care | Payer: Self-pay | Source: Ambulatory Visit | Attending: Otolaryngology

## 2015-06-21 ENCOUNTER — Ambulatory Visit: Payer: Self-pay | Admitting: Allergy and Immunology

## 2015-06-21 ENCOUNTER — Encounter (HOSPITAL_COMMUNITY): Payer: Self-pay | Admitting: Certified Registered Nurse Anesthetist

## 2015-06-21 DIAGNOSIS — G4733 Obstructive sleep apnea (adult) (pediatric): Secondary | ICD-10-CM | POA: Diagnosis not present

## 2015-06-21 DIAGNOSIS — J342 Deviated nasal septum: Secondary | ICD-10-CM

## 2015-06-21 DIAGNOSIS — K219 Gastro-esophageal reflux disease without esophagitis: Secondary | ICD-10-CM | POA: Diagnosis not present

## 2015-06-21 DIAGNOSIS — E119 Type 2 diabetes mellitus without complications: Secondary | ICD-10-CM | POA: Insufficient documentation

## 2015-06-21 DIAGNOSIS — I10 Essential (primary) hypertension: Secondary | ICD-10-CM | POA: Diagnosis not present

## 2015-06-21 DIAGNOSIS — G4730 Sleep apnea, unspecified: Secondary | ICD-10-CM | POA: Diagnosis not present

## 2015-06-21 DIAGNOSIS — N3281 Overactive bladder: Secondary | ICD-10-CM | POA: Diagnosis not present

## 2015-06-21 DIAGNOSIS — J45909 Unspecified asthma, uncomplicated: Secondary | ICD-10-CM | POA: Insufficient documentation

## 2015-06-21 DIAGNOSIS — Z6836 Body mass index (BMI) 36.0-36.9, adult: Secondary | ICD-10-CM | POA: Insufficient documentation

## 2015-06-21 DIAGNOSIS — E039 Hypothyroidism, unspecified: Secondary | ICD-10-CM | POA: Diagnosis not present

## 2015-06-21 DIAGNOSIS — G4700 Insomnia, unspecified: Secondary | ICD-10-CM | POA: Diagnosis not present

## 2015-06-21 DIAGNOSIS — J343 Hypertrophy of nasal turbinates: Secondary | ICD-10-CM | POA: Diagnosis not present

## 2015-06-21 DIAGNOSIS — E785 Hyperlipidemia, unspecified: Secondary | ICD-10-CM | POA: Diagnosis not present

## 2015-06-21 DIAGNOSIS — M549 Dorsalgia, unspecified: Secondary | ICD-10-CM | POA: Diagnosis not present

## 2015-06-21 DIAGNOSIS — J3489 Other specified disorders of nose and nasal sinuses: Secondary | ICD-10-CM | POA: Diagnosis not present

## 2015-06-21 DIAGNOSIS — G8929 Other chronic pain: Secondary | ICD-10-CM | POA: Insufficient documentation

## 2015-06-21 HISTORY — PX: NASAL SEPTOPLASTY W/ TURBINOPLASTY: SHX2070

## 2015-06-21 LAB — GLUCOSE, CAPILLARY
Glucose-Capillary: 123 mg/dL — ABNORMAL HIGH (ref 65–99)
Glucose-Capillary: 128 mg/dL — ABNORMAL HIGH (ref 65–99)

## 2015-06-21 SURGERY — SEPTOPLASTY, NOSE, WITH NASAL TURBINATE REDUCTION
Anesthesia: General | Site: Nose | Laterality: Bilateral

## 2015-06-21 MED ORDER — PHENYLEPHRINE 40 MCG/ML (10ML) SYRINGE FOR IV PUSH (FOR BLOOD PRESSURE SUPPORT)
PREFILLED_SYRINGE | INTRAVENOUS | Status: AC
  Filled 2015-06-21: qty 10

## 2015-06-21 MED ORDER — EPHEDRINE SULFATE 50 MG/ML IJ SOLN
INTRAMUSCULAR | Status: AC
  Filled 2015-06-21: qty 1

## 2015-06-21 MED ORDER — DEXAMETHASONE SODIUM PHOSPHATE 10 MG/ML IJ SOLN
10.0000 mg | Freq: Once | INTRAMUSCULAR | Status: AC
  Administered 2015-06-21: 10 mg via INTRAVENOUS
  Filled 2015-06-21: qty 1

## 2015-06-21 MED ORDER — LACTATED RINGERS IV SOLN
INTRAVENOUS | Status: DC | PRN
  Administered 2015-06-21: 07:00:00 via INTRAVENOUS

## 2015-06-21 MED ORDER — LIDOCAINE HCL (CARDIAC) 20 MG/ML IV SOLN
INTRAVENOUS | Status: DC | PRN
  Administered 2015-06-21: 60 mg via INTRAVENOUS

## 2015-06-21 MED ORDER — FENTANYL CITRATE (PF) 100 MCG/2ML IJ SOLN
INTRAMUSCULAR | Status: DC | PRN
  Administered 2015-06-21: 100 ug via INTRAVENOUS

## 2015-06-21 MED ORDER — SUCCINYLCHOLINE CHLORIDE 20 MG/ML IJ SOLN
INTRAMUSCULAR | Status: AC
  Filled 2015-06-21: qty 1

## 2015-06-21 MED ORDER — MUPIROCIN CALCIUM 2 % EX CREA
TOPICAL_CREAM | CUTANEOUS | Status: DC | PRN
  Administered 2015-06-21: 1 via TOPICAL

## 2015-06-21 MED ORDER — HYDROCODONE-ACETAMINOPHEN 5-325 MG PO TABS: 1.0000 | ORAL_TABLET | Freq: Four times a day (QID) | ORAL | Status: DC | PRN

## 2015-06-21 MED ORDER — SUGAMMADEX SODIUM 200 MG/2ML IV SOLN
INTRAVENOUS | Status: AC
  Filled 2015-06-21: qty 2

## 2015-06-21 MED ORDER — SUGAMMADEX SODIUM 200 MG/2ML IV SOLN
INTRAVENOUS | Status: DC | PRN
  Administered 2015-06-21: 200 mg via INTRAVENOUS

## 2015-06-21 MED ORDER — MIDAZOLAM HCL 5 MG/5ML IJ SOLN
INTRAMUSCULAR | Status: DC | PRN
  Administered 2015-06-21: 2 mg via INTRAVENOUS

## 2015-06-21 MED ORDER — HYDROMORPHONE HCL 1 MG/ML IJ SOLN: 0.2500 mg | INTRAMUSCULAR | Status: DC | PRN

## 2015-06-21 MED ORDER — ROCURONIUM BROMIDE 100 MG/10ML IV SOLN
INTRAVENOUS | Status: DC | PRN
  Administered 2015-06-21: 50 mg via INTRAVENOUS

## 2015-06-21 MED ORDER — LIDOCAINE HCL (CARDIAC) 20 MG/ML IV SOLN
INTRAVENOUS | Status: AC
Start: 2015-06-21 — End: 2015-06-21
  Filled 2015-06-21: qty 5

## 2015-06-21 MED ORDER — ONDANSETRON HCL 4 MG/2ML IJ SOLN
INTRAMUSCULAR | Status: DC | PRN
  Administered 2015-06-21: 4 mg via INTRAVENOUS

## 2015-06-21 MED ORDER — PHENYLEPHRINE HCL 10 MG/ML IJ SOLN
INTRAMUSCULAR | Status: DC | PRN
  Administered 2015-06-21: 40 ug via INTRAVENOUS

## 2015-06-21 MED ORDER — CEFAZOLIN SODIUM-DEXTROSE 2-3 GM-% IV SOLR
2.0000 g | INTRAVENOUS | Status: AC
  Administered 2015-06-21: 2 g via INTRAVENOUS
  Filled 2015-06-21: qty 50

## 2015-06-21 MED ORDER — 0.9 % SODIUM CHLORIDE (POUR BTL) OPTIME
TOPICAL | Status: DC | PRN
  Administered 2015-06-21: 1000 mL

## 2015-06-21 MED ORDER — ROCURONIUM BROMIDE 50 MG/5ML IV SOLN
INTRAVENOUS | Status: AC
  Filled 2015-06-21: qty 1

## 2015-06-21 MED ORDER — STERILE WATER FOR INJECTION IJ SOLN
INTRAMUSCULAR | Status: AC
  Filled 2015-06-21: qty 10

## 2015-06-21 MED ORDER — PROMETHAZINE HCL 25 MG/ML IJ SOLN: 6.2500 mg | INTRAMUSCULAR | Status: DC | PRN

## 2015-06-21 MED ORDER — MUPIROCIN CALCIUM 2 % EX CREA
TOPICAL_CREAM | CUTANEOUS | Status: AC
  Filled 2015-06-21: qty 15

## 2015-06-21 MED ORDER — ONDANSETRON HCL 4 MG/2ML IJ SOLN
INTRAMUSCULAR | Status: AC
  Filled 2015-06-21: qty 2

## 2015-06-21 MED ORDER — LIDOCAINE-EPINEPHRINE 1 %-1:100000 IJ SOLN
INTRAMUSCULAR | Status: DC | PRN
  Administered 2015-06-21: 6 mL

## 2015-06-21 MED ORDER — PROPOFOL 10 MG/ML IV BOLUS
INTRAVENOUS | Status: AC
  Filled 2015-06-21: qty 20

## 2015-06-21 MED ORDER — OXYMETAZOLINE HCL 0.05 % NA SOLN
NASAL | Status: DC | PRN
  Administered 2015-06-21: 1 via TOPICAL

## 2015-06-21 MED ORDER — LIDOCAINE-EPINEPHRINE 1 %-1:100000 IJ SOLN
INTRAMUSCULAR | Status: AC
  Filled 2015-06-21: qty 1

## 2015-06-21 MED ORDER — MIDAZOLAM HCL 2 MG/2ML IJ SOLN
INTRAMUSCULAR | Status: AC
  Filled 2015-06-21: qty 2

## 2015-06-21 MED ORDER — AMOXICILLIN-POT CLAVULANATE 500-125 MG PO TABS: 1.0000 | ORAL_TABLET | Freq: Two times a day (BID) | ORAL | Status: DC

## 2015-06-21 MED ORDER — OXYMETAZOLINE HCL 0.05 % NA SOLN
NASAL | Status: AC
  Filled 2015-06-21: qty 15

## 2015-06-21 MED ORDER — FENTANYL CITRATE (PF) 250 MCG/5ML IJ SOLN
INTRAMUSCULAR | Status: AC
  Filled 2015-06-21: qty 5

## 2015-06-21 MED ORDER — PROPOFOL 10 MG/ML IV BOLUS
INTRAVENOUS | Status: DC | PRN
  Administered 2015-06-21: 150 mg via INTRAVENOUS
  Administered 2015-06-21: 50 mg via INTRAVENOUS

## 2015-06-21 SURGICAL SUPPLY — 26 items
BLADE SURG 15 STRL LF DISP TIS (BLADE) ×1 IMPLANT
BLADE SURG 15 STRL SS (BLADE) ×2
CANISTER SUCTION 2500CC (MISCELLANEOUS) ×2 IMPLANT
COAGULATOR SUCT 8FR VV (MISCELLANEOUS) ×2 IMPLANT
DRAPE PROXIMA HALF (DRAPES) IMPLANT
ELECT REM PT RETURN 9FT ADLT (ELECTROSURGICAL) ×2
ELECTRODE REM PT RTRN 9FT ADLT (ELECTROSURGICAL) IMPLANT
GAUZE SPONGE 2X2 8PLY STRL LF (GAUZE/BANDAGES/DRESSINGS) ×1 IMPLANT
GLOVE BIOGEL M 7.0 STRL (GLOVE) ×4 IMPLANT
GOWN STRL REUS W/ TWL LRG LVL3 (GOWN DISPOSABLE) ×2 IMPLANT
GOWN STRL REUS W/TWL LRG LVL3 (GOWN DISPOSABLE) ×4
KIT BASIN OR (CUSTOM PROCEDURE TRAY) ×2 IMPLANT
KIT ROOM TURNOVER OR (KITS) ×2 IMPLANT
NDL HYPO 25GX1X1/2 BEV (NEEDLE) ×1 IMPLANT
NEEDLE HYPO 25GX1X1/2 BEV (NEEDLE) ×2 IMPLANT
NS IRRIG 1000ML POUR BTL (IV SOLUTION) ×2 IMPLANT
PAD ARMBOARD 7.5X6 YLW CONV (MISCELLANEOUS) ×2 IMPLANT
SPLINT NASAL DOYLE BI-VL (GAUZE/BANDAGES/DRESSINGS) ×2 IMPLANT
SPONGE GAUZE 2X2 STER 10/PKG (GAUZE/BANDAGES/DRESSINGS)
SPONGE NEURO XRAY DETECT 1X3 (DISPOSABLE) ×2 IMPLANT
SUT ETHILON 3 0 PS 1 (SUTURE) ×2 IMPLANT
SUT PLAIN 4 0 ~~LOC~~ 1 (SUTURE) ×2 IMPLANT
TOWEL OR 17X24 6PK STRL BLUE (TOWEL DISPOSABLE) ×3 IMPLANT
TRAY ENT MC OR (CUSTOM PROCEDURE TRAY) ×2 IMPLANT
TUBE SALEM SUMP 16 FR W/ARV (TUBING) ×2 IMPLANT
TUBING EXTENTION W/L.L. (IV SETS) ×2 IMPLANT

## 2015-06-21 NOTE — Op Note (Signed)
[name], [name]                ACCOUNT NO.:  [id number]  MEDICAL RECORD NO.:  [id number]  LOCATION:  [facility]                         FACILITY:  [facility]  PHYSICIAN:  Early Chars. Wilburn Cornelia, M.D.DATE OF BIRTH:  1950/09/02  DATE OF PROCEDURE:  06/21/2015 DATE OF DISCHARGE:                              OPERATIVE REPORT   LOCATION:  [facility].  PREOPERATIVE DIAGNOSES: 1. Nasal septal deviation with airway obstruction. 2. Bilateral inferior turbinate hypertrophy. 3. History of obstructive sleep apnea.  POSTOPERATIVE DIAGNOSES: 1. Nasal septal deviation with airway obstruction. 2. Bilateral inferior turbinate hypertrophy. 3. History of obstructive sleep apnea.  PROCEDURE: 1. Nasal septoplasty. 2. Bilateral inferior turbinate reduction.  ANESTHESIA:  General endotracheal.  COMPLICATIONS:  None.  ESTIMATED BLOOD LOSS:  Loss less than 50 mL.  DISPOSITION:  The patient is transferred from the operating room to the recovery room in stable condition.  BRIEF HISTORY:  The patient is a 64 year old white female, referred to our office for evaluation of progressive symptoms of nasal airway obstruction.  Examination revealed a severely deviated nasal septum and bilateral inferior turbinate hypertrophy.  The patient has a history of chronic allergy and sinusitis and obstructive sleep apnea.  She wears nightly CPAP with increasing difficulty secondary to nasal airway obstruction.  Given her history and findings, I recommended the above surgical procedures.  The risks and benefits were discussed in detail. The patient understood and agreed with our plan for surgery, which is scheduled on elective basis at [place].  DESCRIPTION OF PROCEDURE:  The patient was brought to the operating room, placed in supine position on the operating table.  General endotracheal anesthesia was established without difficulty.  The patient was positioned and prepped and draped.   A surgical time-out was then performed with the correct identification of the patient and the surgical procedure.  Her nose was then injected with a total of 6 mL of 1% lidocaine 1:100,000 solution of epinephrine injected in submucosal fashion along the nasal septum and inferior turbinates bilaterally. Bilateral Afrin-soaked cotton pledgets then placed and left in position for approximately 10 minutes to allow for vasoconstriction and hemostasis.  The patient was prepped, draped, and prepared for surgery.  With the patient position, I prepared a left anterior hemitransfixion incision, which created a mucoperichondrial flap, was elevated from anterior to posterior on the left hand side.  The anterior cartilaginous septum crossed the midline and mucoperichondrial flap was elevated on the right.  The patient had severely deviated nasal bone and cartilage with septal spurring obstructing the left nasal passageway.  The septal bone and cartilage was mobilized and brought to good midline position. Anterior mid septal cartilage was removed.  This was later morselized and returned to the mucoperichondrial pocket.  Dissection was carried from anterior to posterior removing the deviated bone and cartilage through cutting forceps.  With the septum brought to good midline position, the septal cartilage was returned to the mucoperichondrial pocket and the flaps were reapproximated with a 4-0 gut suture on a Keith needle in a horizontal mattressing fashion.  The anterior hemitransfixion incision was closed with the same stitch.  At the conclusion of the surgical  procedure, bilateral Doyle nasal septal splints were placed after the application of Bactroban ointment and sutured in position with a 3-0 Ethilon suture.  Inferior turbinate reduction was then performed.  Cautery set at 12 watts.  Two submucosal passes were made in each inferior turbinate. When the turbinates had been adequately cauterized,  anterior incisions were created, overlying soft tissue was elevated, small amount of turbinate bone was resected.  The turbinates were then outfractured creating more patent nasal cavity.  The patient's nasal cavity was irrigated and suctioned.  An orogastric tube was passed from stomach.  Contents were aspirated.  The patient was awakened from anesthetic, extubated, and transferred from the operating room to the recovery room in stable condition.  There were no complications.  The estimated blood loss was less than 50 mL.          ______________________________ Early Chars. Wilburn Cornelia, M.D.     DLS/MEDQ  D:  [id number]  T:  06/21/2015  Job:  [id number]

## 2015-06-21 NOTE — Brief Op Note (Signed)
06/21/2015  8:52 AM  PATIENT:  [name]  64 y.o. female  PRE-OPERATIVE DIAGNOSIS:  DEVIATED NASAL SEPTUM OBSTRUCTIVE SLEEP APNEA   POST-OPERATIVE DIAGNOSIS:  DEVIATED NASAL SEPTUM OBSTRUCTIVE SLEEP APNEA   PROCEDURE:  Procedure(s): NASAL SEPTOPLASTY WITH BILATERAL TURBINATE REDUCTION (Bilateral)  SURGEON:  Surgeon(s) and Role:    * Jerrell Belfast, MD - Primary  PHYSICIAN ASSISTANT:   ASSISTANTS: none   ANESTHESIA:   general  EBL:  Total I/O In: 700 [I.V.:700] Out: 50 [Blood:50]  BLOOD ADMINISTERED:none  DRAINS: none   LOCAL MEDICATIONS USED:  LIDOCAINE  and Amount: 6 ml  SPECIMEN:  No Specimen  DISPOSITION OF SPECIMEN:  N/A  COUNTS:  YES  TOURNIQUET:  * No tourniquets in log *  DICTATION: .Other Dictation: Dictation Number [id number]  PLAN OF CARE: Discharge to home after PACU  PATIENT DISPOSITION:  PACU - hemodynamically stable.   Delay start of Pharmacological VTE agent (>24hrs) due to surgical blood loss or risk of bleeding: not applicable

## 2015-06-21 NOTE — Anesthesia Preprocedure Evaluation (Addendum)
Anesthesia Evaluation  Patient identified by MRN, date of birth, ID band Patient awake    Reviewed: Allergy & Precautions, NPO status , Patient's Chart, lab work & pertinent test results  Airway Mallampati: II  TM Distance: <3 FB Neck ROM: Full    Dental no notable dental hx. (+) Teeth Intact, Dental Advisory Given   Pulmonary neg pulmonary ROS, asthma , sleep apnea and Continuous Positive Airway Pressure Ventilation ,    Pulmonary exam normal breath sounds clear to auscultation       Cardiovascular hypertension, Pt. on medications Normal cardiovascular exam Rhythm:Regular Rate:Normal     Neuro/Psych negative neurological ROS  negative psych ROS   GI/Hepatic negative GI ROS, Neg liver ROS, GERD  Medicated,  Endo/Other  diabetes, Type 2Hypothyroidism Morbid obesityobesity  Renal/GU negative Renal ROS  negative genitourinary   Musculoskeletal negative musculoskeletal ROS (+)   Abdominal   Peds negative pediatric ROS (+)  Hematology negative hematology ROS (+)   Anesthesia Other Findings   Reproductive/Obstetrics negative OB ROS                            Anesthesia Physical Anesthesia Plan  ASA: III  Anesthesia Plan: General   Post-op Pain Management:    Induction: Intravenous  Airway Management Planned: Oral ETT  Additional Equipment:   Intra-op Plan:   Post-operative Plan: Extubation in OR  Informed Consent: I have reviewed the patients History and Physical, chart, labs and discussed the procedure including the risks, benefits and alternatives for the proposed anesthesia with the patient or authorized representative who has indicated his/her understanding and acceptance.   Dental advisory given  Plan Discussed with: CRNA and Surgeon  Anesthesia Plan Comments:         Anesthesia Quick Evaluation

## 2015-06-21 NOTE — Anesthesia Postprocedure Evaluation (Signed)
Anesthesia Post Note  Patient: [name]  Procedure(s) Performed: Procedure(s) (LRB): NASAL SEPTOPLASTY WITH BILATERAL TURBINATE REDUCTION (Bilateral)  Patient location during evaluation: PACU Anesthesia Type: General Level of consciousness: awake and alert Pain management: pain level controlled Vital Signs Assessment: post-procedure vital signs reviewed and stable Respiratory status: spontaneous breathing and respiratory function stable Cardiovascular status: blood pressure returned to baseline and stable Postop Assessment: no signs of nausea or vomiting Anesthetic complications: no    Last Vitals:  Filed Vitals:   06/21/15 0855 06/21/15 0911  BP: 161/74 144/71  Pulse: 106 92  Temp: 36.4 C   Resp: 16 23    Last Pain: There were no vitals filed for this visit.               Naamah Boggess S

## 2015-06-21 NOTE — Transfer of Care (Signed)
Immediate Anesthesia Transfer of Care Note  Patient: [name]  Procedure(s) Performed: Procedure(s): NASAL SEPTOPLASTY WITH BILATERAL TURBINATE REDUCTION (Bilateral)  Patient Location: PACU  Anesthesia Type:General  Level of Consciousness: awake, alert  and oriented  Airway & Oxygen Therapy: Patient Spontanous Breathing and Patient connected to face mask oxygen  Post-op Assessment: Report given to RN, Post -op Vital signs reviewed and stable and Patient moving all extremities X 4  Post vital signs: Reviewed and stable  Last Vitals:  Filed Vitals:   06/21/15 0551  BP: 179/94  Pulse: 87  Temp: 36.7 C  Resp: 18    Complications: No apparent anesthesia complications

## 2015-06-21 NOTE — H&P (Signed)
[name] is an 64 y.o. female.   Chief Complaint: Deviated Nasal Septum and airway obstruction HPI: Progressive Deviated Nasal Septum with OSA  Past Medical History  Diagnosis Date  . Hypertension   . GERD (gastroesophageal reflux disease)   . Hyperlipemia   . Chronic back pain   . Seasonal allergies   . OAB (overactive bladder)   . Insomnia   . Sleep apnea     uses a cpap  . Asthma   . Hypothyroidism   . Diabetes mellitus without complication ([place])     no med    Past Surgical History  Procedure Laterality Date  . Cesarean section  83,86  . Tubal ligation    . Tonsillectomy    . Appendectomy    . Carpal tunnel release      rt  . Back surgery  2010    lumb disk  . Ankle arthrotomy  2003    left-fx  . Colonoscopy    . Dilation and curettage of uterus      x2  . Carpal tunnel release Left 08/01/2013    Procedure: LEFT CARPAL TUNNEL RELEASE;  Surgeon: Wynonia Sours, MD;  Location: [facility];  Service: Orthopedics;  Laterality: Left;    History reviewed. No pertinent family history. Social History:  reports that she has never smoked. She does not have any smokeless tobacco history on file. She reports that she drinks alcohol. She reports that she does not use illicit drugs.  Allergies: No Known Allergies  Medications Prior to Admission  Medication Sig Dispense Refill  . 5-Hydroxytryptophan (5-HTP MAXIMUM STRENGTH) 200 MG CAPS Take 200 mg by mouth daily.    Marland Kitchen amLODipine (NORVASC) 5 MG tablet Take 5 mg by mouth daily.    Marland Kitchen azelastine (ASTELIN) 0.1 % nasal spray Place 2 sprays into both nostrils every evening. Use in each nostril as directed 90 mL 1  . beclomethasone (QVAR) 40 MCG/ACT inhaler Inhale 2 puffs into the lungs 2 (two) times daily. 3 Inhaler 1  . fesoterodine (TOVIAZ) 8 MG TB24 tablet Take 8 mg by mouth at bedtime.     . gabapentin (NEURONTIN) 300 MG capsule Take 300 mg by mouth 3 (three) times daily.    Marland Kitchen GLUCOSAMINE-CHONDROITIN PO Take 1  tablet by mouth daily. Move Free Ultra Triple Action    . hydrochlorothiazide (MICROZIDE) 12.5 MG capsule Take 12.5 mg by mouth daily.    Marland Kitchen levothyroxine (SYNTHROID, LEVOTHROID) 88 MCG tablet Take 88 mcg by mouth daily before breakfast.    . losartan (COZAAR) 100 MG tablet Take 100 mg by mouth daily.    . montelukast (SINGULAIR) 10 MG tablet Take 10 mg by mouth at bedtime.    . Multiple Vitamin (MULTIVITAMIN WITH MINERALS) TABS tablet Take 1 tablet by mouth at bedtime.    Marland Kitchen omeprazole (PRILOSEC OTC) 20 MG tablet Take 20 mg by mouth daily before supper.    Marland Kitchen OVER THE COUNTER MEDICATION Place 1 drop into both eyes 2 (two) times daily. OTC lubricating eye drop    . simvastatin (ZOCOR) 20 MG tablet Take 20 mg by mouth at bedtime.     Marland Kitchen zolpidem (AMBIEN) 10 MG tablet Take 5 mg by mouth at bedtime as needed for sleep.     Marland Kitchen HYDROcodone-acetaminophen (NORCO) 5-325 MG per tablet Take 1 tablet by mouth every 6 (six) hours as needed for moderate pain. (Patient not taking: Reported on 06/14/2015) 30 tablet 0  . meloxicam (MOBIC) 7.5  MG tablet Take 7.5 mg by mouth See admin instructions. Take 1 tablet (7.5 mg) by mouth every morning, may take an additional tablet at bedtime as needed for pain    . Omega-3 Fatty Acids (FISH OIL) 1200 MG CAPS Take 1,200 mg by mouth daily.      No results found for this or any previous visit (from the past 48 hour(s)). No results found.  Review of Systems  Constitutional: Negative.   HENT: Positive for congestion.   Respiratory: Negative.   Cardiovascular: Negative.   Gastrointestinal: Negative.     Blood pressure 179/94, pulse 87, temperature 98.1 F (36.7 C), temperature source Oral, resp. rate 18, height 5\' 8"  (1.727 m), weight 107.502 kg (237 lb), SpO2 94 %. Physical Exam  Constitutional: She is oriented to person, place, and time. She appears well-developed and well-nourished.  HENT:  Deviated Nasal Septum  Neck: Normal range of motion. Neck supple.   Cardiovascular: Normal rate.   Respiratory: Effort normal.  GI: Soft.  Neurological: She is alert and oriented to person, place, and time.     Assessment/Plan Adm for OP septoplasty and IT reduction.  [place], Yaffa Seckman 06/21/2015, 7:52 AM

## 2015-06-21 NOTE — Anesthesia Procedure Notes (Signed)
Procedure Name: Intubation Date/Time: 06/21/2015 8:06 AM Performed by: Garrison Columbus T Pre-anesthesia Checklist: Patient identified, Emergency Drugs available, Suction available and Patient being monitored Patient Re-evaluated:Patient Re-evaluated prior to inductionOxygen Delivery Method: Circle system utilized Preoxygenation: Pre-oxygenation with 100% oxygen Intubation Type: IV induction Ventilation: Mask ventilation without difficulty and Oral airway inserted - appropriate to patient size Laryngoscope Size: Sabra Heck and 2 Grade View: Grade III Tube type: Oral Tube size: 7.5 mm Number of attempts: 1 Airway Equipment and Method: Stylet,  Oral airway and Bougie stylet Placement Confirmation: ETT inserted through vocal cords under direct vision,  positive ETCO2 and breath sounds checked- equal and bilateral Secured at: 22 cm Tube secured with: Tape Dental Injury: Teeth and Oropharynx as per pre-operative assessment  Difficulty Due To: Difficulty was anticipated, Difficult Airway- due to anterior larynx and Difficult Airway- due to limited oral opening

## 2015-06-24 ENCOUNTER — Encounter (HOSPITAL_COMMUNITY): Payer: Self-pay | Admitting: Otolaryngology

## 2015-09-13 DIAGNOSIS — J343 Hypertrophy of nasal turbinates: Secondary | ICD-10-CM | POA: Insufficient documentation

## 2015-09-13 DIAGNOSIS — J342 Deviated nasal septum: Secondary | ICD-10-CM | POA: Insufficient documentation

## 2015-12-13 ENCOUNTER — Encounter: Payer: Self-pay | Admitting: Allergy and Immunology

## 2015-12-13 ENCOUNTER — Ambulatory Visit (INDEPENDENT_AMBULATORY_CARE_PROVIDER_SITE_OTHER): Payer: 59 | Admitting: Allergy and Immunology

## 2015-12-13 VITALS — BP 128/76 | HR 70 | Temp 97.8°F | Resp 16

## 2015-12-13 DIAGNOSIS — R05 Cough: Secondary | ICD-10-CM | POA: Diagnosis not present

## 2015-12-13 DIAGNOSIS — R059 Cough, unspecified: Secondary | ICD-10-CM

## 2015-12-13 DIAGNOSIS — J310 Chronic rhinitis: Secondary | ICD-10-CM

## 2015-12-13 MED ORDER — ALBUTEROL SULFATE 108 (90 BASE) MCG/ACT IN AEPB: 2.0000 | INHALATION_SPRAY | RESPIRATORY_TRACT | Status: DC | PRN

## 2015-12-13 MED ORDER — BECLOMETHASONE DIPROPIONATE 80 MCG/ACT IN AERS: 2.0000 | INHALATION_SPRAY | Freq: Two times a day (BID) | RESPIRATORY_TRACT | Status: DC

## 2015-12-13 MED ORDER — AZELASTINE HCL 0.1 % NA SOLN: 1.0000 | Freq: Two times a day (BID) | NASAL | Status: DC

## 2015-12-13 NOTE — Patient Instructions (Addendum)
   Continue current medication regime--Singulair, Flonase, Astelin and QVAR.  Refills sent today.  Follow-up in 6-9 months or sooner if needed.  May decrease Astelin to 1 spray daily symptom free.

## 2015-12-13 NOTE — Progress Notes (Signed)
FOLLOW UP NOTE  RE: ERABELLA SELDON MRN: [id number] DOB: 15-Jan-1951 [facility]  [address] [place] [id number] Date of Office Visit: 12/13/2015  Subjective:  [name] is a 65 y.o. female who presents today for Cough and Rhinitis  Assessment:   1. Cough--multifactorial, likely component of bronchospasm improved on low dose inhaled corticosteroid.   2. Mixed rhinitis, well controlled.   3.      History of septoplasty and turbinate reduction. Plan:   Meds ordered this encounter  Medications  . beclomethasone (QVAR) 80 MCG/ACT inhaler    Sig: Inhale 2 puffs into the lungs 2 (two) times daily.    Dispense:  3 Inhaler    Refill:  1  . azelastine (ASTELIN) 0.1 % nasal spray    Sig: Place 1 spray into both nostrils 2 (two) times daily. Use in each nostril as directed    Dispense:  90 mL    Refill:  1  . Albuterol Sulfate (PROAIR RESPICLICK) 123XX123 (90 Base) MCG/ACT AEPB    Sig: Inhale 2 puffs into the lungs every 4 (four) hours as needed.    Dispense:  1 each    Refill:  1   Patient Instructions  1.  Continue current medication regime--Singulair, Flonase, Astelin and QVAR. 2.  Refills sent today, as needed ProAir. 3.  May decrease Astelin to 1 spray daily when symptom free. 4.  Follow-up in 6-9 months or sooner if needed.  HPI: [name] returns to the office in follow-up of cough and mixed rhinitis.  Since her last visit here she had septoplasty and turbinate reduction with Dr. Wilburn Cornelia in December 2016.  She feels she is breathing through her nose better and feels her medication regime is working well for her.  She has added Flonase on occasion for a few months per Dr. Victorio Palm recommendation, which was beneficial.  She notes with decreased post nasal drip her cough and throat clearing decrease, but if she misses her QVAR, she also notes cough.  There has been no shortness of breath, chest congestion, difficulty in breathing or wheeze.   She has not needed her albuterol and when able to fit exercise into her schedule has no chest symptoms.  Denies ED or urgent care visits, prednisone or recurring antibiotic courses. Reports sleep and activity are normal.  She is requesting continuing on her current regime.  She continues to use her CPAP without issue.  Sade has a current medication list which includes the following prescription(s): 5-hydroxytryptophan, amlodipine, fesoterodine, hydrochlorothiazide, levothyroxine, losartan, melatonin, montelukast, omeprazole, simvastatin, zolpidem, azelastine, beclomethasone and fluticasone.  Drug Allergies: No Known Allergies  Objective:   Filed Vitals:   12/13/15 1138  BP: 128/76  Pulse: 70  Temp: 97.8 F (36.6 C)  Resp: 16   SpO2 Readings from Last 1 Encounters:  12/13/15 98%   Physical Exam  Constitutional: She is well-developed, well-nourished, and in no distress.  HENT:  Head: Atraumatic.  Right Ear: Tympanic membrane and ear canal normal.  Left Ear: Tympanic membrane and ear canal normal.  Nose: Mucosal edema present. No rhinorrhea. No epistaxis.  Mouth/Throat: Oropharynx is clear and moist and mucous membranes are normal. No oropharyngeal exudate, posterior oropharyngeal edema or posterior oropharyngeal erythema.  Neck: Neck supple.  Cardiovascular: Normal rate, S1 normal and S2 normal.   No murmur heard. Pulmonary/Chest: Effort normal. She has no wheezes. She has no rhonchi. She has no rales.  Lymphadenopathy:    She has no  cervical adenopathy.   Diagnostics: Spirometry:  FVC 2.84--84%, FEV1 2.48--93%.    Roselyn M. Ishmael Holter, MD  cc: Marjorie Smolder, MD

## 2016-01-30 ENCOUNTER — Emergency Department (HOSPITAL_BASED_OUTPATIENT_CLINIC_OR_DEPARTMENT_OTHER)
Admission: EM | Admit: 2016-01-30 | Discharge: 2016-01-30 | Disposition: A | Discharge status: Home / Self Care | Payer: 59 | Attending: Emergency Medicine | Admitting: Emergency Medicine

## 2016-01-30 ENCOUNTER — Emergency Department (HOSPITAL_BASED_OUTPATIENT_CLINIC_OR_DEPARTMENT_OTHER): Payer: 59

## 2016-01-30 ENCOUNTER — Encounter (HOSPITAL_BASED_OUTPATIENT_CLINIC_OR_DEPARTMENT_OTHER): Payer: Self-pay | Admitting: *Deleted

## 2016-01-30 DIAGNOSIS — Z79899 Other long term (current) drug therapy: Secondary | ICD-10-CM | POA: Diagnosis not present

## 2016-01-30 DIAGNOSIS — E119 Type 2 diabetes mellitus without complications: Secondary | ICD-10-CM | POA: Diagnosis not present

## 2016-01-30 DIAGNOSIS — Z7984 Long term (current) use of oral hypoglycemic drugs: Secondary | ICD-10-CM | POA: Diagnosis not present

## 2016-01-30 DIAGNOSIS — J45909 Unspecified asthma, uncomplicated: Secondary | ICD-10-CM | POA: Diagnosis not present

## 2016-01-30 DIAGNOSIS — R002 Palpitations: Secondary | ICD-10-CM | POA: Diagnosis not present

## 2016-01-30 DIAGNOSIS — E039 Hypothyroidism, unspecified: Secondary | ICD-10-CM | POA: Insufficient documentation

## 2016-01-30 DIAGNOSIS — I499 Cardiac arrhythmia, unspecified: Secondary | ICD-10-CM | POA: Diagnosis present

## 2016-01-30 DIAGNOSIS — E785 Hyperlipidemia, unspecified: Secondary | ICD-10-CM | POA: Insufficient documentation

## 2016-01-30 DIAGNOSIS — I10 Essential (primary) hypertension: Secondary | ICD-10-CM | POA: Diagnosis not present

## 2016-01-30 LAB — CBC WITH DIFFERENTIAL/PLATELET
BASOS PCT: 0 %
Basophils Absolute: 0 10*3/uL (ref 0.0–0.1)
EOS PCT: 2 %
Eosinophils Absolute: 0.1 10*3/uL (ref 0.0–0.7)
HCT: 40.6 % (ref 36.0–46.0)
HEMOGLOBIN: 13.8 g/dL (ref 12.0–15.0)
LYMPHS ABS: 2.3 10*3/uL (ref 0.7–4.0)
Lymphocytes Relative: 29 %
MCH: 28.8 pg (ref 26.0–34.0)
MCHC: 34 g/dL (ref 30.0–36.0)
MCV: 84.6 fL (ref 78.0–100.0)
MONOS PCT: 5 %
Monocytes Absolute: 0.4 10*3/uL (ref 0.1–1.0)
NEUTROS ABS: 4.9 10*3/uL (ref 1.7–7.7)
Neutrophils Relative %: 64 %
PLATELETS: 272 10*3/uL (ref 150–400)
RBC: 4.8 MIL/uL (ref 3.87–5.11)
RDW: 13.8 % (ref 11.5–15.5)
WBC: 7.7 10*3/uL (ref 4.0–10.5)

## 2016-01-30 LAB — COMPREHENSIVE METABOLIC PANEL
ALBUMIN: 4.2 g/dL (ref 3.5–5.0)
ALK PHOS: 77 U/L (ref 38–126)
ALT: 54 U/L (ref 14–54)
ANION GAP: 9 (ref 5–15)
AST: 39 U/L (ref 15–41)
BUN: 21 mg/dL — ABNORMAL HIGH (ref 6–20)
CALCIUM: 9.5 mg/dL (ref 8.9–10.3)
CHLORIDE: 107 mmol/L (ref 101–111)
CO2: 24 mmol/L (ref 22–32)
Creatinine, Ser: 0.67 mg/dL (ref 0.44–1.00)
GFR calc non Af Amer: >60 mL/min (ref >60)
GLUCOSE: 111 mg/dL — AB (ref 65–99)
POTASSIUM: 3.4 mmol/L — AB (ref 3.5–5.1)
SODIUM: 140 mmol/L (ref 135–145)
Total Bilirubin: 0.6 mg/dL (ref 0.3–1.2)
Total Protein: 7.3 g/dL (ref 6.5–8.1)

## 2016-01-30 LAB — MAGNESIUM: MAGNESIUM: 2.2 mg/dL (ref 1.7–2.4)

## 2016-01-30 LAB — TROPONIN I: Troponin I: <0.03 ng/mL (ref <0.03)

## 2016-01-30 MED ORDER — ASPIRIN 81 MG PO CHEW
324.0000 mg | CHEWABLE_TABLET | Freq: Once | ORAL | Status: AC
  Administered 2016-01-30: 324 mg via ORAL
  Filled 2016-01-30: qty 4

## 2016-01-30 MED ORDER — POTASSIUM CHLORIDE CRYS ER 20 MEQ PO TBCR
40.0000 meq | EXTENDED_RELEASE_TABLET | Freq: Once | ORAL | Status: AC
  Administered 2016-01-30: 40 meq via ORAL
  Filled 2016-01-30: qty 2

## 2016-01-30 NOTE — ED Triage Notes (Signed)
Fluttering in her left chest on and off x 3 weeks. Last night it was more constant. Today it started an hour ago and she has a full feeling in her chest.

## 2016-01-30 NOTE — ED Notes (Signed)
Patient given discharge instructions and verbalized understanding. Patient ambulatory to discharge. NAD noted.

## 2016-01-30 NOTE — ED Provider Notes (Signed)
[facility] DEPT [facility] Provider Note   CSN: [id number] Arrival date & time: 01/30/16  1329  First Provider Contact:  First MD Initiated Contact with Patient 01/30/16 1435        History   Chief Complaint Chief Complaint  Patient presents with  . Irregular Heart Beat    HPI CALLYN KRICK is a 65 y.o. female.  Patient is a 65 year old female with a history of asthma, diet-controlled diabetes, hypertension, hyperlipidemia and significant family history of both parents with MIs presenting today with palpitations and chest pain started at 7 PM last night. Patient states around 7 PM last night at rest she started to develop palpitations where it felt like she had extra beats and her heart was skipping beats. This lasted from 7 PM to 11 PM. She started to have some dull chest discomfort in the left side of her chest with some pressure prior to going to bed without radiation. She denies SOB, n/v, diaphoresis or cough.  She was able to sleep all night and when she woke up in the morning she is alert.  She denies any OTC stimulants or change/discontinuation in meds   The history is provided by the patient.    Past Medical History:  Diagnosis Date  . Asthma   . Chronic back pain   . Diabetes mellitus without complication ([place])    no med  . GERD (gastroesophageal reflux disease)   . Hyperlipemia   . Hypertension   . Hypothyroidism   . Insomnia   . OAB (overactive bladder)   . Seasonal allergies   . Sleep apnea    uses a cpap    Patient Active Problem List   Diagnosis Date Noted  . Deflected nasal septum 09/13/2015  . Hypertrophy of nasal turbinates 09/13/2015  . Deviated nasal septum 06/21/2015    Class: Chronic  . HYPERLIPIDEMIA 11/18/2007  . HYPERTENSION 11/18/2007  . ALLERGIC RHINITIS 11/18/2007  . COUGH 11/18/2007    Past Surgical History:  Procedure Laterality Date  . ANKLE ARTHROTOMY  2003   left-fx  . APPENDECTOMY    . BACK SURGERY  2010   lumb disk  .  CARPAL TUNNEL RELEASE     rt  . CARPAL TUNNEL RELEASE Left 08/01/2013   Procedure: LEFT CARPAL TUNNEL RELEASE;  Surgeon: Wynonia Sours, MD;  Location: [facility];  Service: Orthopedics;  Laterality: Left;  . CESAREAN SECTION  83,86  . COLONOSCOPY    . DILATION AND CURETTAGE OF UTERUS     x2  . NASAL SEPTOPLASTY W/ TURBINOPLASTY Bilateral 06/21/2015   Procedure: NASAL SEPTOPLASTY WITH BILATERAL TURBINATE REDUCTION;  Surgeon: Jerrell Belfast, MD;  Location: [facility];  Service: ENT;  Laterality: Bilateral;  . SINOSCOPY    . TONSILLECTOMY    . TUBAL LIGATION      OB History    No data available       Home Medications    Prior to Admission medications   Medication Sig Start Date End Date Taking? Authorizing Provider  5-Hydroxytryptophan (5-HTP MAXIMUM STRENGTH) 200 MG CAPS Take 200 mg by mouth daily.    Historical Provider, MD  Albuterol Sulfate (PROAIR RESPICLICK) [id number] (90 Base) MCG/ACT AEPB Inhale 2 puffs into the lungs every 4 (four) hours as needed. 12/13/15   Roselyn Malachy Moan, MD  amLODipine (NORVASC) 5 MG tablet Take 5 mg by mouth daily.    Historical Provider, MD  amoxicillin-clavulanate (AUGMENTIN) 500-125 MG tablet Take 1 tablet (500 mg total) by  mouth 2 (two) times daily. Patient not taking: Reported on 12/13/2015 06/21/15   Jerrell Belfast, MD  azelastine (ASTELIN) 0.1 % nasal spray Place 1 spray into both nostrils 2 (two) times daily. Use in each nostril as directed 12/13/15   Gean Quint, MD  beclomethasone (QVAR) 80 MCG/ACT inhaler Inhale 2 puffs into the lungs 2 (two) times daily. 12/13/15   Roselyn Malachy Moan, MD  fesoterodine (TOVIAZ) 8 MG TB24 tablet Take 8 mg by mouth at bedtime.     Historical Provider, MD  gabapentin (NEURONTIN) 300 MG capsule Take 300 mg by mouth 2 (two) times daily.     Historical Provider, MD  GLUCOSAMINE-CHONDROITIN PO Take 1 tablet by mouth daily. Move Free Ultra Triple Action    Historical Provider, MD  hydrochlorothiazide (MICROZIDE) 12.5 MG  capsule Take 12.5 mg by mouth daily.    Historical Provider, MD  HYDROcodone-acetaminophen (NORCO) 5-325 MG tablet Take 1-2 tablets by mouth every 6 (six) hours as needed. 06/21/15   Jerrell Belfast, MD  levothyroxine (SYNTHROID, LEVOTHROID) 88 MCG tablet Take 88 mcg by mouth daily before breakfast.    Historical Provider, MD  losartan (COZAAR) 100 MG tablet Take 100 mg by mouth daily.    Historical Provider, MD  Melatonin 3 MG TABS Take 3 mg by mouth as needed.    Historical Provider, MD  meloxicam (MOBIC) 7.5 MG tablet Take 7.5 mg by mouth See admin instructions. Take 1 tablet (7.5 mg) by mouth every morning, may take an additional tablet at bedtime as needed for pain    Historical Provider, MD  montelukast (SINGULAIR) 10 MG tablet Take 10 mg by mouth at bedtime.    Historical Provider, MD  Multiple Vitamin (MULTIVITAMIN WITH MINERALS) TABS tablet Take 1 tablet by mouth at bedtime.    Historical Provider, MD  Omega-3 Fatty Acids (FISH OIL) 1200 MG CAPS Take 1,200 mg by mouth daily.    Historical Provider, MD  omeprazole (PRILOSEC OTC) 20 MG tablet Take 20 mg by mouth daily before supper.    Historical Provider, MD  OVER THE COUNTER MEDICATION Place 1 drop into both eyes 2 (two) times daily. Reported on 12/13/2015    Historical Provider, MD  QVAR 40 MCG/ACT inhaler  10/21/15   Historical Provider, MD  simvastatin (ZOCOR) 20 MG tablet Take 20 mg by mouth at bedtime.     Historical Provider, MD  zolpidem (AMBIEN) 10 MG tablet Take 5 mg by mouth at bedtime as needed for sleep.     Historical Provider, MD    Family History Family History  Problem Relation Age of Onset  . Allergic rhinitis Father   . Allergic rhinitis Sister   . Asthma Sister   . Asthma Paternal Aunt   . Angioedema Neg Hx   . Eczema Neg Hx   . Immunodeficiency Neg Hx   . Urticaria Neg Hx     Social History Social History  Substance Use Topics  . Smoking status: Never Smoker  . Smokeless tobacco: Never Used  . Alcohol use  Yes     Comment: occ     Allergies   Review of patient's allergies indicates no known allergies.   Review of Systems Review of Systems  All other systems reviewed and are negative.    Physical Exam Updated Vital Signs BP 120/65   Pulse 72   Temp 97.7 F (36.5 C) (Oral)   Resp 23   Ht 5\' 8"  (1.727 m)   Wt 235 lb (106.6 kg)  SpO2 98%   BMI 35.73 kg/m   Physical Exam  Constitutional: She is oriented to person, place, and time. She appears well-developed and well-nourished. No distress.  HENT:  Head: Normocephalic and atraumatic.  Mouth/Throat: Oropharynx is clear and moist.  Eyes: Conjunctivae and EOM are normal. Pupils are equal, round, and reactive to light.  Neck: Normal range of motion. Neck supple.  Cardiovascular: Normal rate, regular rhythm and intact distal pulses.   No murmur heard. Pulmonary/Chest: Effort normal and breath sounds normal. No respiratory distress. She has no wheezes. She has no rales.  Abdominal: Soft. She exhibits no distension. There is no tenderness. There is no rebound and no guarding.  Musculoskeletal: Normal range of motion. She exhibits no edema or tenderness.  Neurological: She is alert and oriented to person, place, and time.  Skin: Skin is warm and dry. No rash noted. No erythema.  Psychiatric: She has a normal mood and affect. Her behavior is normal.  Nursing note and vitals reviewed.    ED Treatments / Results  Labs (all labs ordered are listed, but only abnormal results are displayed) Labs Reviewed  CBC WITH DIFFERENTIAL/PLATELET  TROPONIN I  COMPREHENSIVE METABOLIC PANEL  MAGNESIUM    EKG  EKG Interpretation  Date/Time:  Thursday January 30 2016 13:49:35 EDT Ventricular Rate:  67 PR Interval:    QRS Duration: 112 QT Interval:  427 QTC Calculation: 451 R Axis:   9 Text Interpretation:  Sinus rhythm Borderline intraventricular conduction delay Borderline T abnormalities, anterior leads No significant change since last  tracing Confirmed by Maryan Rued  MD, Loree Fee ([id number]) on 01/30/2016 1:56:23 PM       Radiology Dg Chest 2 View  Result Date: 01/30/2016 CLINICAL DATA:  Chest pain. EXAM: CHEST  2 VIEW COMPARISON:  02/28/2009 FINDINGS: Artifact overlies chest. Heart size is normal. Mediastinal shadows are normal. The lungs are clear. No effusions. No bony abnormalities. IMPRESSION: No active cardiopulmonary disease. Electronically Signed   By: Nelson Chimes M.D.   On: 01/30/2016 14:48   Procedures Procedures (including critical care time)  Medications Ordered in ED Medications  aspirin chewable tablet 324 mg (324 mg Oral Given 01/30/16 1444)     Initial Impression / Assessment and Plan / ED Course  I have reviewed the triage vital signs and the nursing notes.  Pertinent labs & imaging results that were available during my care of the patient were reviewed by me and considered in my medical decision making (see chart for details).  Clinical Course    Patient is a 65 year old female with hypertension, diabetes and asthma presenting with palpitations and chest discomfort that started last night. Patient's EKG here is within normal limits however in the room when she stated she felt the palpitations appear to be PACs. She has no prior cardiac history and had a negative stress test a proximally 5 years ago after having some chest pain which was normal. She has a history of 10 years ago wearing a Holter monitor which came back normal. Currently she is having very rare flutter and currently denies chest pain.  Heart score 3.  PERC neg. Low suspicion for PE, dissection or GI pathology.  patient is on multiple blood pressure medications and it is possible that she has hypokalemia. Lower suspicion for ACS base and story and EKG. Patient was given a 324 mg aspirin.  CBC, CMP, magnesium, troponin, chest x-ray pending. Patient checked out to Dr. Tyrone Nine at [phone]  Final Clinical Impressions(s) / ED Diagnoses  Final  diagnoses:  None    New Prescriptions New Prescriptions   No medications on file     Blanchie Dessert, MD 01/30/16 [id number]

## 2016-01-30 NOTE — ED Notes (Signed)
Pt on heart monitor 

## 2016-03-02 ENCOUNTER — Other Ambulatory Visit: Payer: Self-pay | Admitting: *Deleted

## 2016-03-04 ENCOUNTER — Other Ambulatory Visit: Payer: Self-pay

## 2016-03-04 MED ORDER — BECLOMETHASONE DIPROPIONATE 80 MCG/ACT IN AERS: 2.0000 | INHALATION_SPRAY | Freq: Two times a day (BID) | RESPIRATORY_TRACT | 3 refills | Status: DC

## 2016-06-01 ENCOUNTER — Other Ambulatory Visit (HOSPITAL_COMMUNITY)
Admission: RE | Admit: 2016-06-01 | Discharge: 2016-06-01 | Disposition: A | Discharge status: Home / Self Care | Payer: 59 | Source: Ambulatory Visit | Attending: Family Medicine | Admitting: Family Medicine

## 2016-06-01 ENCOUNTER — Other Ambulatory Visit: Payer: Self-pay | Admitting: Family Medicine

## 2016-06-01 DIAGNOSIS — Z01419 Encounter for gynecological examination (general) (routine) without abnormal findings: Secondary | ICD-10-CM | POA: Insufficient documentation

## 2016-06-01 DIAGNOSIS — Z1151 Encounter for screening for human papillomavirus (HPV): Secondary | ICD-10-CM | POA: Diagnosis not present

## 2016-06-03 LAB — CYTOLOGY - PAP
Diagnosis: NEGATIVE
HPV (WINDOPATH): NOT DETECTED

## 2016-06-12 ENCOUNTER — Encounter: Payer: Self-pay | Admitting: Family Medicine

## 2016-06-17 DIAGNOSIS — R002 Palpitations: Secondary | ICD-10-CM | POA: Insufficient documentation

## 2016-06-19 ENCOUNTER — Ambulatory Visit (INDEPENDENT_AMBULATORY_CARE_PROVIDER_SITE_OTHER): Payer: 59

## 2016-06-19 DIAGNOSIS — R002 Palpitations: Secondary | ICD-10-CM | POA: Diagnosis not present

## 2016-06-25 ENCOUNTER — Encounter (INDEPENDENT_AMBULATORY_CARE_PROVIDER_SITE_OTHER): Payer: Self-pay

## 2016-06-25 ENCOUNTER — Ambulatory Visit (INDEPENDENT_AMBULATORY_CARE_PROVIDER_SITE_OTHER): Payer: 59 | Admitting: Allergy

## 2016-06-25 ENCOUNTER — Encounter: Payer: Self-pay | Admitting: Allergy

## 2016-06-25 VITALS — BP 130/80 | HR 70 | Resp 18

## 2016-06-25 DIAGNOSIS — J4530 Mild persistent asthma, uncomplicated: Secondary | ICD-10-CM | POA: Diagnosis not present

## 2016-06-25 DIAGNOSIS — J3089 Other allergic rhinitis: Secondary | ICD-10-CM | POA: Diagnosis not present

## 2016-06-25 MED ORDER — AZELASTINE HCL 0.1 % NA SOLN: 2.0000 | Freq: Two times a day (BID) | NASAL | 1 refills | Status: DC

## 2016-06-25 MED ORDER — BECLOMETHASONE DIPROPIONATE 80 MCG/ACT IN AERS: 2.0000 | INHALATION_SPRAY | Freq: Two times a day (BID) | RESPIRATORY_TRACT | 3 refills | Status: DC

## 2016-06-25 NOTE — Patient Instructions (Signed)
   Continue current medication regime--Singulair, Flonase, Astelin and QVAR 72mcg 2 puffs daily.  Refills sent today.  Follow-up in 6-9 months or sooner if needed.  May decrease Astelin to 1 spray daily symptom free.

## 2016-06-25 NOTE — Progress Notes (Signed)
Follow-up Note  RE: [name] MRN: [id number] DOB: 01-06-1951 Date of Office Visit: 06/25/2016   History of present illness: [name] is a 65 y.o. female presenting today for follow-up of cough and mixed rhinitis.  She was last seen in the office on 12/13/2015 by Dr. Ishmael Holter.   She reports she has done well since her last visit.  She has been using Qvar 18mcg 2 puffs daily.  She has not needed to use albuterol in the past year.  She takes singulair daily.  Also using Astelin 2 sprays twice a day.  She has flonase that she uses as needed for nasal congestion but has not needed to use this.  She reports most of her symptoms which were cough calms when she is talking a lot which she has to do for her job. She has not required any ED or urgent care visits or any oral steroid needs or hospitalizations. She denies any nighttime awakenings and she continues use her CPAP.    Review of systems: Review of Systems  Constitutional: Negative for chills, fever and malaise/fatigue.  HENT: Negative for congestion, ear pain, nosebleeds, sinus pain and sore throat.   Eyes: Negative for discharge and redness.  Respiratory: Positive for cough. Negative for shortness of breath and wheezing.   Cardiovascular: Negative for chest pain.  Gastrointestinal: Negative for heartburn, nausea and vomiting.  Skin: Negative for itching and rash.    All other systems negative unless noted above in HPI  Past medical/social/surgical/family history have been reviewed and are unchanged unless specifically indicated below.  No changes  Medication List: Allergies as of 06/25/2016   No Known Allergies     Medication List       Accurate as of 06/25/16  1:14 PM. Always use your most recent med list.          5-HTP MAXIMUM STRENGTH 200 MG Caps Generic drug:  5-Hydroxytryptophan Take 200 mg by mouth daily.   Albuterol Sulfate 108 (90 Base) MCG/ACT Aepb Commonly known as:  PROAIR RESPICLICK Inhale 2  puffs into the lungs every 4 (four) hours as needed.   amLODipine 5 MG tablet Commonly known as:  NORVASC Take 5 mg by mouth daily.   azelastine 0.1 % nasal spray Commonly known as:  ASTELIN Place 1 spray into both nostrils 2 (two) times daily. Use in each nostril as directed   beclomethasone 80 MCG/ACT inhaler Commonly known as:  QVAR Inhale 2 puffs into the lungs 2 (two) times daily.   Fish Oil 1200 MG Caps Take 1,200 mg by mouth daily.   FLUARIX QUADRIVALENT 0.5 ML injection Generic drug:  Influenza vac split quadrivalent PF Inject 0.5 mLs into the skin as directed.   gabapentin 300 MG capsule Commonly known as:  NEURONTIN Take 600 mg by mouth 2 (two) times daily.   GLUCOSAMINE-CHONDROITIN PO Take 1 tablet by mouth daily. Move Free Ultra Triple Action   hydrochlorothiazide 12.5 MG capsule Commonly known as:  MICROZIDE Take 12.5 mg by mouth daily.   levothyroxine 88 MCG tablet Commonly known as:  SYNTHROID, LEVOTHROID Take 88 mcg by mouth daily before breakfast.   losartan 100 MG tablet Commonly known as:  COZAAR Take 100 mg by mouth daily.   Melatonin 3 MG Tabs Take 3 mg by mouth as needed.   meloxicam 7.5 MG tablet Commonly known as:  MOBIC Take 7.5 mg by mouth See admin instructions. Take 1 tablet (7.5 mg) by mouth every morning, may take  an additional tablet at bedtime as needed for pain   montelukast 10 MG tablet Commonly known as:  SINGULAIR Take 10 mg by mouth at bedtime.   multivitamin with minerals Tabs tablet Take 1 tablet by mouth at bedtime.   omeprazole 20 MG tablet Commonly known as:  PRILOSEC OTC Take 20 mg by mouth every other day.   simvastatin 20 MG tablet Commonly known as:  ZOCOR Take 20 mg by mouth at bedtime.   TOVIAZ 8 MG Tb24 tablet Generic drug:  fesoterodine Take 8 mg by mouth at bedtime.   zolpidem 10 MG tablet Commonly known as:  AMBIEN Take 5 mg by mouth at bedtime as needed for sleep.       Known medication  allergies: No Known Allergies   Physical examination: Blood pressure 130/80, pulse 70, resp. rate 18, SpO2 97 %.  General: Alert, interactive, in no acute distress. HEENT: TMs pearly gray, turbinates mildly edematous without discharge, post-pharynx non erythematous. Neck: Supple without lymphadenopathy. Lungs: Clear to auscultation without wheezing, rhonchi or rales. {no increased work of breathing. CV: Normal S1, S2 without murmurs. Abdomen: Nondistended, nontender. Skin: Warm and dry, without lesions or rashes. Extremities:  No clubbing, cyanosis or edema. Neuro:   Grossly intact.  Diagnositics/Labs: Spirometry: FEV1: 2.32L  87%, FVC: 2.67L  79%, ratio consistent with Nonobstructive pattern. Her FVC is only slightly reduced.  Assessment and plan:    Mild persistent asthma  - Her cough symptoms are improved on low-dose inhaled steroid  - Continue Qvar 80 g 2 puffs daily.  She will increase to twice a day dosing during asthma flares  - Continue Singulair 10 mg daily Asthma control goals:   Full participation in all desired activities (may need albuterol before activity)  Albuterol use two time or less a week on average (not counting use with activity)  Cough interfering with sleep two time or less a month  Oral steroids no more than once a year  No hospitalizations   Mixed rhinitis  - Well-controlled  - Continue Singulair as above  - Continue Flonase 1-2 sprays each nostril daily as needed for congestion   - Continue Astelin 1-2 sprays twice a day  Follow-up in 6-9 months or sooner if needed.  I appreciate the opportunity to take part in Lavena's care. Please do not hesitate to contact me with questions.  Sincerely,   Prudy Feeler, MD Allergy/Immunology [facility]

## 2016-12-01 ENCOUNTER — Other Ambulatory Visit: Payer: Self-pay

## 2016-12-01 MED ORDER — FLUTICASONE PROPIONATE HFA 110 MCG/ACT IN AERO: 2.0000 | INHALATION_SPRAY | Freq: Two times a day (BID) | RESPIRATORY_TRACT | 5 refills | Status: DC

## 2016-12-08 ENCOUNTER — Telehealth: Payer: Self-pay

## 2016-12-08 NOTE — Telephone Encounter (Signed)
SENT NOTES TO SCHEDULING 

## 2016-12-10 ENCOUNTER — Telehealth: Payer: Self-pay | Admitting: Cardiovascular Disease

## 2016-12-10 NOTE — Telephone Encounter (Signed)
Received records from [facility] for appointment on 12/11/16 with Dr Gwenlyn Found.  Records put with Dr Kennon Holter schedule for 12/11/16. lp

## 2016-12-11 ENCOUNTER — Encounter (INDEPENDENT_AMBULATORY_CARE_PROVIDER_SITE_OTHER): Payer: Self-pay

## 2016-12-11 ENCOUNTER — Ambulatory Visit (INDEPENDENT_AMBULATORY_CARE_PROVIDER_SITE_OTHER): Payer: 59 | Admitting: Cardiovascular Disease

## 2016-12-11 ENCOUNTER — Encounter: Payer: Self-pay | Admitting: Cardiovascular Disease

## 2016-12-11 VITALS — BP 144/78 | HR 57 | Ht 67.0 in | Wt 239.0 lb

## 2016-12-11 DIAGNOSIS — I10 Essential (primary) hypertension: Secondary | ICD-10-CM

## 2016-12-11 DIAGNOSIS — G4733 Obstructive sleep apnea (adult) (pediatric): Secondary | ICD-10-CM | POA: Diagnosis not present

## 2016-12-11 DIAGNOSIS — R002 Palpitations: Secondary | ICD-10-CM | POA: Diagnosis not present

## 2016-12-11 NOTE — Assessment & Plan Note (Signed)
History of obstructive sleep apnea on CPAP which benefits are

## 2016-12-11 NOTE — Progress Notes (Signed)
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 Her cardiac risk factors are notable for treated hypertension, diabetes and hyperlipidemia. She has never had a heart attack or stroke. She denies chest pain or shortness of breath. She has had a normal GXT in the past. She has obstructive sleep apnea on CPAP. She had palpitations more noticeable over the last year increasing in frequency and severity. She does live a fairly stressful lifestyle and travels frequently. She also drinks 3 cups of coffee a day.   Current Outpatient Prescriptions  Medication Sig Dispense Refill  . 5-Hydroxytryptophan (5-HTP MAXIMUM STRENGTH) 200 MG CAPS Take 200 mg by mouth daily.    . Albuterol Sulfate (PROAIR RESPICLICK) 761 (90 Base) MCG/ACT AEPB Inhale 2 puffs into the lungs every 4 (four) hours as needed. 1 each 1  . amLODipine (NORVASC) 5 MG tablet Take 5 mg by mouth daily.    Marland Kitchen azelastine (ASTELIN) 0.1 % nasal spray Place 2 sprays into both nostrils 2 (two) times daily. Use in each nostril as directed 90 mL 1  . beclomethasone (QVAR) 80 MCG/ACT inhaler Inhale 2 puffs into the lungs 2 (two) times daily. 1 Inhaler 3  . fesoterodine (TOVIAZ) 8 MG TB24 tablet Take 8 mg by mouth at bedtime.     Marland Kitchen FLUARIX QUADRIVALENT 0.5 ML injection Inject 0.5 mLs into the skin as directed.  0  . fluticasone (FLOVENT HFA) 110 MCG/ACT inhaler Inhale 2 puffs into the lungs 2 (two) times daily. 1 Inhaler 5  . gabapentin (NEURONTIN) 300 MG capsule Take 600 mg by mouth 3 (three) times daily.     Marland Kitchen GLUCOSAMINE-CHONDROITIN PO Take 1 tablet by mouth daily. Move Free Ultra Triple  Action    . hydrochlorothiazide (MICROZIDE) 12.5 MG capsule Take 12.5 mg by mouth daily.    Marland Kitchen levothyroxine (SYNTHROID, LEVOTHROID) 100 MCG tablet     . levothyroxine (SYNTHROID, LEVOTHROID) 88 MCG tablet Take 88 mcg by mouth daily before breakfast.    . losartan (COZAAR) 100 MG tablet Take 100 mg by mouth daily.    . Melatonin 3 MG TABS Take 3 mg by mouth as needed.    . meloxicam (MOBIC) 7.5 MG tablet Take 7.5 mg by mouth See admin instructions. Take 1 tablet (7.5 mg) by mouth every morning, may take an additional tablet at bedtime as needed for pain    . montelukast (SINGULAIR) 10 MG tablet Take 10 mg by mouth at bedtime.    . Multiple Vitamin (MULTIVITAMIN WITH MINERALS) TABS tablet Take 1 tablet by mouth at bedtime.    . Omega-3 Fatty Acids (FISH OIL) 1200 MG CAPS Take 1,200 mg by mouth daily.    . simvastatin (ZOCOR) 20 MG tablet Take 20 mg by mouth at bedtime.     Marland Kitchen zolpidem (AMBIEN) 10 MG tablet Take 5 mg by mouth at bedtime as needed for sleep.      No current facility-administered medications for this visit.     No Known Allergies  Social History   Social History  . Marital status: Married    Spouse name: N/A  . Number of children: N/A  .  Years of education: N/A   Occupational History  . Not on file.   Social History Main Topics  . Smoking status: Never Smoker  . Smokeless tobacco: Never Used  . Alcohol use Yes     Comment: occ  . Drug use: No  . Sexual activity: Not on file   Other Topics Concern  . Not on file   Social History Narrative  . No narrative on file     Review of Systems: General: negative for chills, fever, night sweats or weight changes.  Cardiovascular: negative for chest pain, dyspnea on exertion, edema, orthopnea, palpitations, paroxysmal nocturnal dyspnea or shortness of breath Dermatological: negative for rash Respiratory: negative for cough or wheezing Urologic: negative for hematuria Abdominal: negative for nausea, vomiting, diarrhea,  bright red blood per rectum, melena, or hematemesis Neurologic: negative for visual changes, syncope, or dizziness All other systems reviewed and are otherwise negative except as noted above.    Blood pressure (!) 144/78, pulse (!) 57, height 5\' 7"  (1.702 m), weight 239 lb (108.4 kg).  General appearance: alert and no distress Neck: no adenopathy, no carotid bruit, no JVD, supple, symmetrical, trachea midline and thyroid not enlarged, symmetric, no tenderness/mass/nodules Lungs: clear to auscultation bilaterally Heart: regular rate and rhythm, S1, S2 normal, no murmur, click, rub or gallop Extremities: extremities normal, atraumatic, no cyanosis or edema  EKG sinus bradycardia 57 without ST or T-wave changes. I personally reviewed this EKG  ASSESSMENT AND PLAN:   HYPERLIPIDEMIA History of hyperlipidemia on statin therapy followed by her PCP  Essential hypertension History of essential hypertension blood pressure measured 144/78. She is on amlodipine, hydrochlorothiazide and losartan. Continue current meds at current dosing  Obstructive sleep apnea History of obstructive sleep apnea on CPAP which benefits are  Palpitations [name] is referred back today for dilation of frequent symptomatic palpitations. She has been evaluated in the past by Dr. Marlou Porch. She had a Holter monitor performed 06/19/16 that showed sinus rhythm with occasional PVCs and PACs. She does admit to a stressful lifestyle traveling quite a bit. She also drinks 2 cups of coffee a day. Her resting heart rate is in the high 50s to low 60s, I do not think she is a candidate for beta blockade. Talked about stress reduction and decrease in caffeine intake. At this time, I'm going to allow her to perform lifestyle/dietary modifications and we'll see her back in 6 months for follow-up.      Lorretta Harp MD FACP,FACC,FAHA, [facility] 12/11/2016 10:48 AM

## 2016-12-11 NOTE — Assessment & Plan Note (Signed)
History of essential hypertension blood pressure measured 144/78. She is on amlodipine, hydrochlorothiazide and losartan. Continue current meds at current dosing

## 2016-12-11 NOTE — Assessment & Plan Note (Signed)
[name] is referred back today for dilation of frequent symptomatic palpitations. She has been evaluated in the past by Dr. Marlou Porch. She had a Holter monitor performed 06/19/16 that showed sinus rhythm with occasional PVCs and PACs. She does admit to a stressful lifestyle traveling quite a bit. She also drinks 2 cups of coffee a day. Her resting heart rate is in the high 50s to low 60s, I do not think she is a candidate for beta blockade. Talked about stress reduction and decrease in caffeine intake. At this time, I'm going to allow her to perform lifestyle/dietary modifications and we'll see her back in 6 months for follow-up.

## 2016-12-11 NOTE — Patient Instructions (Signed)

## 2016-12-11 NOTE — Assessment & Plan Note (Signed)
History of hyperlipidemia on statin therapy followed by her PCP. 

## 2017-06-10 ENCOUNTER — Encounter: Payer: Self-pay | Admitting: Allergy

## 2017-06-10 ENCOUNTER — Ambulatory Visit: Payer: 59 | Admitting: Allergy

## 2017-06-10 VITALS — BP 112/72 | HR 82 | Resp 18 | Ht 65.5 in | Wt 240.0 lb

## 2017-06-10 DIAGNOSIS — J310 Chronic rhinitis: Secondary | ICD-10-CM | POA: Diagnosis not present

## 2017-06-10 DIAGNOSIS — J4530 Mild persistent asthma, uncomplicated: Secondary | ICD-10-CM

## 2017-06-10 NOTE — Progress Notes (Signed)
Follow-up Note  RE: [name] MRN: [id number] DOB: 01/10/1951 Date of Office Visit: 06/10/2017   History of present illness: [name] is a 66 y.o. female presenting today for follow-up of asthma and mixed rhinitis.  She was last seen in the office on 06/25/16 by myself.  She has been doing well over the last year without any major health changes, surgeries or hospitalizations.  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  Today she states she woke up sneezing but otherwise has been doing well since last visit a year ago with her rhinitis symptoms.  She does use Astelin daily.  She used Flonase when her nasal symptoms are worse and does use it for several weeks a time before discontinuing.  She occasionally will use nasal saline rinse.  She reports her breathing has been doing well.  She denies any coughing, wheezing, SOB and no nighttime awakenings. She reports she has not used her albuterol at all this year.  No ED/UC visits or oral prednisone since last visit.  She is almost done with her Qvar HFA that she takes 2 puffs daily and will change over to the Flovent.  She continues on singulair daily.    She has had her flu vaccine this season.     Review of systems: Review of Systems  Constitutional: Negative for chills, fever and malaise/fatigue.  HENT: Positive for congestion. Negative for ear discharge, ear pain, nosebleeds, sinus pain and sore throat.   Eyes: Negative for pain, discharge and redness.  Respiratory: Negative for cough, shortness of breath and wheezing.   Cardiovascular: Negative for chest pain.  Gastrointestinal: Negative for abdominal pain, constipation, diarrhea, heartburn, nausea and vomiting.  Musculoskeletal: Negative for joint pain.  Skin: Negative for itching and rash.  Neurological: Negative for headaches.    All other systems negative unless noted above in HPI  Past  medical/social/surgical/family history have been reviewed and are unchanged unless specifically indicated below.  No changes  Medication List: Allergies as of 06/10/2017   No Known Allergies     Medication List        Accurate as of 06/10/17  1:24 PM. Always use your most recent med list.          5-HTP MAXIMUM STRENGTH 200 MG Caps Generic drug:  5-Hydroxytryptophan Take 200 mg by mouth daily.   Albuterol Sulfate 108 (90 Base) MCG/ACT Aepb Commonly known as:  PROAIR RESPICLICK Inhale 2 puffs into the lungs every 4 (four) hours as needed.   amLODipine 5 MG tablet Commonly known as:  NORVASC Take 5 mg by mouth daily.   azelastine 0.1 % nasal spray Commonly known as:  ASTELIN Place 2 sprays into both nostrils 2 (two) times daily. Use in each nostril as directed   Fish Oil 1200 MG Caps Take 1,200 mg by mouth daily.   fluticasone 110 MCG/ACT inhaler Commonly known as:  FLOVENT HFA Inhale 2 puffs into the lungs 2 (two) times daily.   gabapentin 300 MG capsule Commonly known as:  NEURONTIN Take 600 mg by mouth 3 (three) times daily.   GLUCOSAMINE-CHONDROITIN PO Take 1 tablet by mouth daily. Move Free Ultra Triple Action   hydrochlorothiazide 12.5 MG capsule Commonly known as:  MICROZIDE Take 12.5 mg by mouth daily.   levothyroxine 100 MCG tablet Commonly known as:  SYNTHROID, LEVOTHROID   losartan 100 MG tablet Commonly known as:  COZAAR Take 100 mg by  mouth daily.   Melatonin 3 MG Tabs Take 3 mg by mouth as needed.   meloxicam 7.5 MG tablet Commonly known as:  MOBIC Take 7.5 mg by mouth See admin instructions. Take 1 tablet (7.5 mg) by mouth every morning, may take an additional tablet at bedtime as needed for pain   montelukast 10 MG tablet Commonly known as:  SINGULAIR Take 10 mg by mouth at bedtime.   multivitamin with minerals Tabs tablet Take 1 tablet by mouth at bedtime.   simvastatin 20 MG tablet Commonly known as:  ZOCOR Take 20 mg by mouth at  bedtime.   TOVIAZ 8 MG Tb24 tablet Generic drug:  fesoterodine Take 8 mg by mouth at bedtime.   zolpidem 10 MG tablet Commonly known as:  AMBIEN Take 5 mg by mouth at bedtime as needed for sleep.       Known medication allergies: No Known Allergies   Physical examination: Blood pressure 112/72, pulse 82, resp. rate 18, height 5' 5.5" (1.664 m), weight 240 lb (108.9 kg), SpO2 98 %.  General: Alert, interactive, in no acute distress. HEENT: TMs pearly gray, turbinates mildly edematous without discharge, post-pharynx moderately erythematous. Neck: Supple without lymphadenopathy. Lungs: Clear to auscultation without wheezing, rhonchi or rales. {no increased work of breathing. CV: Normal S1, S2 without murmurs. Abdomen: Nondistended, nontender. Skin: Warm and dry, without lesions or rashes. Extremities:  No clubbing, cyanosis or edema. Neuro:   Grossly intact.  Diagnositics/Labs:  Spirometry: FEV1: 2.37L  89%, FVC: 2.77L 80%, ratio consistent with normal function  Assessment and plan:   Asthma, mild persistent  -  cough/respiratory symptoms improved on low-dose inhaled steroid  - Continue Qvar 80 g or Flovent 110 g  2 puffs daily.   Increase to 2 puffs twice a day dosing during asthma flares/respiratory illnesses  - Continue Singulair 10 mg daily Asthma control goals:   Full participation in all desired activities (may need albuterol before activity)  Albuterol use two time or less a week on average (not counting use with activity)  Cough interfering with sleep two time or less a month  Oral steroids no more than once a year  No hospitalizations   Mixed rhinitis  - Continue Singulair as above  - Continue Flonase 1-2 sprays each nostril daily as needed for congestion.  Use for 1-2 weeks at a time before stopping once symptoms improved  - Continue Astelin 1-2 sprays twice a day for nasal drainage/post-nasal drip  Follow-up in 6-9 months or sooner if needed.  I  appreciate the opportunity to take part in [name]'s care. Please do not hesitate to contact me with questions.  Sincerely,   Prudy Feeler, MD Allergy/Immunology [facility]

## 2017-06-10 NOTE — Patient Instructions (Addendum)
Asthma  -  cough/respiratory symptoms improved on low-dose inhaled steroid  - Continue Qvar 80 g or Flovent 110 g  2 puffs daily.   Increase to 2 puffs twice a day dosing during asthma flares/respiratory illnesses  - Continue Singulair 10 mg daily Asthma control goals:   Full participation in all desired activities (may need albuterol before activity)  Albuterol use two time or less a week on average (not counting use with activity)  Cough interfering with sleep two time or less a month  Oral steroids no more than once a year  No hospitalizations   Mixed rhinitis  - Continue Singulair as above  - Continue Flonase 1-2 sprays each nostril daily as needed for congestion.  Use for 1-2 weeks at a time before stopping once symptoms improved  - Continue Astelin 1-2 sprays twice a day for nasal drainage/post-nasal drip  Follow-up in 6-9 months or sooner if needed.

## 2017-06-27 ENCOUNTER — Other Ambulatory Visit: Payer: Self-pay | Admitting: Allergy

## 2017-07-16 ENCOUNTER — Ambulatory Visit: Payer: Self-pay | Admitting: Surgery

## 2017-07-16 NOTE — H&P (View-Only) (Signed)
Justin Mend Documented: 07/16/2017 9:26 AM Location: [facility] Patient #: [id number] DOB: [birth year] Married / Language: [name] / Race: White Female  History of Present Illness [name] A. Keidan Aumiller MD; 07/16/2017 10:02 AM) Patient words: lipoma back     Patient sent at the request of Dr. Micheal Likens for multiple lipoma. She is to lipoma on her upper back at the base of her neck. She has a 1 in her upper neck at the base of her scalp. These are growing and causing pain. The patient denies any redness, drainage or numbness of the upper extremities or pain in the upper extremity. They hurt more when she sits back and drives or lays on them. She thinks in the last few months the one on the right upper back is larger.  The patient is a 67 year old female.   Past Surgical History [name], CMA; 07/16/2017 9:26 AM) Appendectomy Cesarean Section - Multiple Colon Polyp Removal - Colonoscopy Foot Surgery Left. Spinal Surgery - Lower Back Tonsillectomy  Diagnostic Studies History [name], CMA; 07/16/2017 9:26 AM) Colonoscopy 1-5 years ago Mammogram within last year Pap Smear 1-5 years ago  Allergies [name], CMA; 07/16/2017 9:26 AM) No Known Drug Allergies [07/16/2017]:  Medication History [name], CMA; 07/16/2017 9:30 AM) [name] (8MG  Tablet ER 24HR, Oral) Active. Simvastatin (20MG  Tablet, Oral) Active. Montelukast Sodium (10MG  Tablet, Oral) Active. Meloxicam (7.5MG  Tablet, Oral) Active. Losartan Potassium (100MG  Tablet, Oral) Active. Levothyroxine Sodium (100MCG Tablet, Oral) Active. HydroCHLOROthiazide (12.5MG  Capsule, Oral) Active. Gabapentin (300MG  Capsule, Oral) Active. Azelastine HCl (0.1% Solution, Nasal) Active. AmLODIPine Besylate (5MG  Tablet, Oral) Active. Glucosamine (Oral) Specific strength unknown - Active. Fish Oil (Oral) Specific strength unknown - Active. Melatonin (Oral) Specific strength  unknown - Active. Qvar (Inhalation) Specific strength unknown - Active. Medications Reconciled  Social History [name], CMA; 07/16/2017 9:26 AM) Alcohol use Occasional alcohol use. Caffeine use Coffee. No drug use Tobacco use Never smoker.  Family History Sharyn Lull R. Rolena Infante, CMA; 07/16/2017 9:26 AM) Cerebrovascular Accident Father, Mother. Depression Father. Diabetes Mellitus Father, Mother. Heart Disease Father, Mother. Hypertension Brother, Father, Mother, Sister. Ovarian Cancer Sister. Thyroid problems Sister.  Pregnancy / Birth History Sharyn Lull R. Rolena Infante, CMA; 07/16/2017 9:26 AM) Age at menarche 85 years. Age of menopause <47 Contraceptive History Oral contraceptives. Gravida 3 Irregular periods Length (months) of breastfeeding 3-6 Maternal age 22-30 Para 2  Other Problems [name], CMA; 07/16/2017 9:26 AM) Asthma Diabetes Mellitus Gastroesophageal Reflux Disease Hemorrhoids High blood pressure Hypercholesterolemia Sleep Apnea Thyroid Disease     Review of Systems [facility] R. Brooks CMA; 07/16/2017 9:26 AM) General Not Present- Appetite Loss, Chills, Fatigue, Fever, Night Sweats, Weight Gain and Weight Loss. Skin Present- Dryness. Not Present- Change in Wart/Mole, Hives, Jaundice, New Lesions, Non-Healing Wounds, Rash and Ulcer. HEENT Present- Seasonal Allergies. Not Present- Earache, Hearing Loss, Hoarseness, Nose Bleed, Oral Ulcers, Ringing in the Ears, Sinus Pain, Sore Throat, Visual Disturbances, Wears glasses/contact lenses and Yellow Eyes. Respiratory Present- Snoring. Not Present- Bloody sputum, Chronic Cough, Difficulty Breathing and Wheezing. Cardiovascular Not Present- Chest Pain, Difficulty Breathing Lying Down, Leg Cramps, Palpitations, Rapid Heart Rate, Shortness of Breath and Swelling of Extremities. Gastrointestinal Not Present- Abdominal Pain, Bloating, Bloody Stool, Change in Bowel Habits, Chronic  diarrhea, Constipation, Difficulty Swallowing, Excessive gas, Gets full quickly at meals, Hemorrhoids, Indigestion, Nausea, Rectal Pain and Vomiting. Female Genitourinary Not Present- Frequency, Nocturia, Painful Urination, Pelvic Pain and Urgency. Musculoskeletal Present- Back Pain. Not Present- Joint Pain,  Joint Stiffness, Muscle Pain, Muscle Weakness and Swelling of Extremities. Neurological Not Present- Decreased Memory, Fainting, Headaches, Numbness, Seizures, Tingling, Tremor, Trouble walking and Weakness. Psychiatric Not Present- Anxiety, Bipolar, Change in Sleep Pattern, Depression, Fearful and Frequent crying. Endocrine Not Present- Cold Intolerance, Excessive Hunger, Hair Changes, Heat Intolerance, Hot flashes and New Diabetes. Hematology Not Present- Blood Thinners, Easy Bruising, Excessive bleeding, Gland problems, HIV and Persistent Infections.  Vitals [name]; 07/16/2017 9:26 AM) 07/16/2017 9:26 AM Weight: 239.5 lb Height: 66.5in Body Surface Area: 2.17 m Body Mass Index: 38.08 kg/m  Pulse: 74 (Regular)  BP: 136/78 (Sitting, Left Arm, Standard)      Physical Exam (Jalei Shibley A. Seger Jani MD; 07/16/2017 10:03 AM)  General Mental Status-Alert. General Appearance-Consistent with stated age. Hydration-Well hydrated. Voice-Normal.  Integumentary Note: Patient's upper back on the right is an 8 cm bites and her lipoma which is subcutaneous and mobile. On the left it is 8 cm x 7 cm immobile. In her upper neck at the base of her skull to 4 cm lipoma mobile just inside the hairline.  Eye Eyeball - Bilateral-Extraocular movements intact. Sclera/Conjunctiva - Bilateral-No scleral icterus.  Chest and Lung Exam Chest and lung exam reveals -quiet, even and easy respiratory effort with no use of accessory muscles and on auscultation, normal breath sounds, no adventitious sounds and normal vocal resonance. Inspection Chest Wall - Normal. Back -  normal.  Neurologic Neurologic evaluation reveals -alert and oriented x 3 with no impairment of recent or remote memory. Mental Status-Normal.  Musculoskeletal Normal Exam - Left-Upper Extremity Strength Normal and Lower Extremity Strength Normal. Normal Exam - Right-Upper Extremity Strength Normal and Lower Extremity Strength Normal.    Assessment & Plan (Orian Figueira A. Anastaisa Wooding MD; 07/16/2017 9:58 AM)  LIPOMA OF BACK (D17.1) Impression: Pt desires excision of painful lipoma upper back and scalp  Risk of bleeding infection nerve injury blood vessel injury worsening of medical problems blood clots cardiovascular events and need for wound care and expectations discussed.  Current Plans The pathophysiology of skin & subcutaneous masses was discussed. Natural history risks without surgery were discussed. I recommended surgery to remove the mass. I explained the technique of removal with use of local anesthesia & possible need for more aggressive sedation/anesthesia for patient comfort.  Risks such as bleeding, infection, wound breakdown, heart attack, death, and other risks were discussed. I noted a good likelihood this will help address the problem. Possibility that this will not correct all symptoms was explained. Possibility of regrowth/recurrence of the mass was discussed. We will work to minimize complications. Questions were answered. The patient expresses understanding & wishes to proceed with surgery.  You are being scheduled for surgery- Our schedulers will call you.  You should hear from our office's scheduling department within 5 working days about the location, date, and time of surgery. We try to make accommodations for patient's preferences in scheduling surgery, but sometimes the OR schedule or the surgeon's schedule prevents [place] from making those accommodations.  If you have not heard from our office [phone]) in 5 working days, call the office and ask for  your surgeon's nurse.  If you have other questions about your diagnosis, plan, or surgery, call the office and ask for your surgeon's nurse.  Pt Education - [facility] General Post-op HCI

## 2017-07-16 NOTE — H&P (Signed)
[name] Documented: 07/16/2017 9:26 AM Location: [facility] Patient #: [id number] DOB: 01-11-1951 Married / Language: [name] / Race: White Female  History of Present Illness Theresa Moores A. Theresa Macken MD; 07/16/2017 10:02 AM) Patient words: lipoma back     Patient sent at the request of Dr. Micheal Guzman for multiple lipoma. She is to lipoma on her upper back at the base of her neck. She has a 1 in her upper neck at the base of her scalp. These are growing and causing pain. The patient denies any redness, drainage or numbness of the upper extremities or pain in the upper extremity. They hurt more when she sits back and drives or lays on them. She thinks in the last few months the one on the right upper back is larger.  The patient is a 67 year old female.   Past Surgical History [name], Guzman; 07/16/2017 9:26 AM) Appendectomy Cesarean Section - Multiple Colon Polyp Removal - Colonoscopy Foot Surgery Left. Spinal Surgery - Lower Back Tonsillectomy  Diagnostic Studies History [name], Guzman; 07/16/2017 9:26 AM) Colonoscopy 1-5 years ago Mammogram within last year Pap Smear 1-5 years ago  Allergies [name], Guzman; 07/16/2017 9:26 AM) No Known Drug Allergies [07/16/2017]:  Medication History [name], [facility]; 07/16/2017 9:30 AM) [name] (8MG  Tablet ER 24HR, Oral) Active. Simvastatin (20MG  Tablet, Oral) Active. Montelukast Sodium (10MG  Tablet, Oral) Active. Meloxicam (7.5MG  Tablet, Oral) Active. Losartan Potassium (100MG  Tablet, Oral) Active. Levothyroxine Sodium (100MCG Tablet, Oral) Active. HydroCHLOROthiazide (12.5MG  Capsule, Oral) Active. Gabapentin (300MG  Capsule, Oral) Active. Azelastine HCl (0.1% Solution, Nasal) Active. AmLODIPine Besylate (5MG  Tablet, Oral) Active. Glucosamine (Oral) Specific strength unknown - Active. Fish Oil (Oral) Specific strength unknown - Active. Melatonin (Oral) Specific strength  unknown - Active. Qvar (Inhalation) Specific strength unknown - Active. Medications Reconciled  Social History [name], Guzman; 07/16/2017 9:26 AM) Alcohol use Occasional alcohol use. Caffeine use Coffee. No drug use Tobacco use Never smoker.  Family History [name], Guzman; 07/16/2017 9:26 AM) Cerebrovascular Accident Father, Mother. Depression Father. Diabetes Mellitus Father, Mother. Heart Disease Father, Mother. Hypertension Brother, Father, Mother, Sister. Ovarian Cancer Sister. Thyroid problems Sister.  Pregnancy / Birth History [name], Guzman; 07/16/2017 9:26 AM) Age at menarche 89 years. Age of menopause <47 Contraceptive History Oral contraceptives. Gravida 3 Irregular periods Length (months) of breastfeeding 3-6 Maternal age 40-30 Para 2  Other Problems [name], Guzman; 07/16/2017 9:26 AM) Asthma Diabetes Mellitus Gastroesophageal Reflux Disease Hemorrhoids High blood pressure Hypercholesterolemia Sleep Apnea Thyroid Disease     Review of Systems [name]; 07/16/2017 9:26 AM) General Not Present- Appetite Loss, Chills, Fatigue, Fever, Night Sweats, Weight Gain and Weight Loss. Skin Present- Dryness. Not Present- Change in Wart/Mole, Hives, Jaundice, New Lesions, Non-Healing Wounds, Rash and Ulcer. HEENT Present- Seasonal Allergies. Not Present- Earache, Hearing Loss, Hoarseness, Nose Bleed, Oral Ulcers, Ringing in the Ears, Sinus Pain, Sore Throat, Visual Disturbances, Wears glasses/contact lenses and Yellow Eyes. Respiratory Present- Snoring. Not Present- Bloody sputum, Chronic Cough, Difficulty Breathing and Wheezing. Cardiovascular Not Present- Chest Pain, Difficulty Breathing Lying Down, Leg Cramps, Palpitations, Rapid Heart Rate, Shortness of Breath and Swelling of Extremities. Gastrointestinal Not Present- Abdominal Pain, Bloating, Bloody Stool, Change in Bowel Habits, Chronic  diarrhea, Constipation, Difficulty Swallowing, Excessive gas, Gets full quickly at meals, Hemorrhoids, Indigestion, Nausea, Rectal Pain and Vomiting. Female Genitourinary Not Present- Frequency, Nocturia, Painful Urination, Pelvic Pain and Urgency. Musculoskeletal Present- Back Pain. Not Present- Joint Pain,  Joint Stiffness, Muscle Pain, Muscle Weakness and Swelling of Extremities. Neurological Not Present- Decreased Memory, Fainting, Headaches, Numbness, Seizures, Tingling, Tremor, Trouble walking and Weakness. Psychiatric Not Present- Anxiety, Bipolar, Change in Sleep Pattern, Depression, Fearful and Frequent crying. Endocrine Not Present- Cold Intolerance, Excessive Hunger, Hair Changes, Heat Intolerance, Hot flashes and New Diabetes. Hematology Not Present- Blood Thinners, Easy Bruising, Excessive bleeding, Gland problems, HIV and Persistent Infections.  Vitals [employer] R. Guzman Guzman; 07/16/2017 9:26 AM) 07/16/2017 9:26 AM Weight: 239.5 lb Height: 66.5in Body Surface Area: 2.17 m Body Mass Index: 38.08 kg/m  Pulse: 74 (Regular)  BP: 136/78 (Sitting, Left Arm, Standard)      Physical Exam (Theresa Snowdon A. [name] MD; 07/16/2017 10:03 AM)  General Mental Status-Alert. General Appearance-Consistent with stated age. Hydration-Well hydrated. Voice-Normal.  Integumentary Note: Patient's upper back on the right is an 8 cm bites and her lipoma which is subcutaneous and mobile. On the left it is 8 cm x 7 cm immobile. In her upper neck at the base of her skull to 4 cm lipoma mobile just inside the hairline.  Eye Eyeball - Bilateral-Extraocular movements intact. Sclera/Conjunctiva - Bilateral-No scleral icterus.  Chest and Lung Exam Chest and lung exam reveals -quiet, even and easy respiratory effort with no use of accessory muscles and on auscultation, normal breath sounds, no adventitious sounds and normal vocal resonance. Inspection Chest Wall - Normal. Back -  normal.  Neurologic Neurologic evaluation reveals -alert and oriented x 3 with no impairment of recent or remote memory. Mental Status-Normal.  Musculoskeletal Normal Exam - Left-Upper Extremity Strength Normal and Lower Extremity Strength Normal. Normal Exam - Right-Upper Extremity Strength Normal and Lower Extremity Strength Normal.    Assessment & Plan (Alen Matheson A. Debra Colon MD; 07/16/2017 9:58 AM)  LIPOMA OF BACK (D17.1) Impression: Pt desires excision of painful lipoma upper back and scalp  Risk of bleeding infection nerve injury blood vessel injury worsening of medical problems blood clots cardiovascular events and need for wound care and expectations discussed.  Current Plans The pathophysiology of skin & subcutaneous masses was discussed. Natural history risks without surgery were discussed. I recommended surgery to remove the mass. I explained the technique of removal with use of local anesthesia & possible need for more aggressive sedation/anesthesia for patient comfort.  Risks such as bleeding, infection, wound breakdown, heart attack, death, and other risks were discussed. I noted a good likelihood this will help address the problem. Possibility that this will not correct all symptoms was explained. Possibility of regrowth/recurrence of the mass was discussed. We will work to minimize complications. Questions were answered. The patient expresses understanding & wishes to proceed with surgery.  You are being scheduled for surgery- Our schedulers will call you.  You should hear from our office's scheduling department within 5 working days about the location, date, and time of surgery. We try to make accommodations for patient's preferences in scheduling surgery, but sometimes the OR schedule or the surgeon's schedule prevents [place] from making those accommodations.  If you have not heard from our office [phone]) in 5 working days, call the office and ask for  your surgeon's nurse.  If you have other questions about your diagnosis, plan, or surgery, call the office and ask for your surgeon's nurse.  Pt Education - [facility] General Post-op HCI

## 2017-07-29 ENCOUNTER — Encounter (HOSPITAL_BASED_OUTPATIENT_CLINIC_OR_DEPARTMENT_OTHER): Payer: Self-pay | Admitting: *Deleted

## 2017-08-02 ENCOUNTER — Encounter (HOSPITAL_BASED_OUTPATIENT_CLINIC_OR_DEPARTMENT_OTHER)
Admission: RE | Admit: 2017-08-02 | Discharge: 2017-08-02 | Disposition: A | Discharge status: Home / Self Care | Payer: 59 | Source: Ambulatory Visit | Attending: Surgery | Admitting: Surgery

## 2017-08-02 DIAGNOSIS — I10 Essential (primary) hypertension: Secondary | ICD-10-CM | POA: Diagnosis not present

## 2017-08-02 DIAGNOSIS — Z6836 Body mass index (BMI) 36.0-36.9, adult: Secondary | ICD-10-CM | POA: Diagnosis not present

## 2017-08-02 DIAGNOSIS — J45909 Unspecified asthma, uncomplicated: Secondary | ICD-10-CM | POA: Diagnosis not present

## 2017-08-02 DIAGNOSIS — E119 Type 2 diabetes mellitus without complications: Secondary | ICD-10-CM | POA: Diagnosis not present

## 2017-08-02 DIAGNOSIS — Z791 Long term (current) use of non-steroidal anti-inflammatories (NSAID): Secondary | ICD-10-CM | POA: Diagnosis not present

## 2017-08-02 DIAGNOSIS — D171 Benign lipomatous neoplasm of skin and subcutaneous tissue of trunk: Secondary | ICD-10-CM | POA: Diagnosis not present

## 2017-08-02 DIAGNOSIS — Z7951 Long term (current) use of inhaled steroids: Secondary | ICD-10-CM | POA: Diagnosis not present

## 2017-08-02 DIAGNOSIS — Z79899 Other long term (current) drug therapy: Secondary | ICD-10-CM | POA: Diagnosis not present

## 2017-08-02 DIAGNOSIS — G4730 Sleep apnea, unspecified: Secondary | ICD-10-CM | POA: Diagnosis not present

## 2017-08-02 DIAGNOSIS — D170 Benign lipomatous neoplasm of skin and subcutaneous tissue of head, face and neck: Secondary | ICD-10-CM | POA: Diagnosis not present

## 2017-08-02 DIAGNOSIS — K219 Gastro-esophageal reflux disease without esophagitis: Secondary | ICD-10-CM | POA: Diagnosis not present

## 2017-08-02 DIAGNOSIS — E039 Hypothyroidism, unspecified: Secondary | ICD-10-CM | POA: Diagnosis not present

## 2017-08-02 DIAGNOSIS — E7800 Pure hypercholesterolemia, unspecified: Secondary | ICD-10-CM | POA: Diagnosis not present

## 2017-08-02 DIAGNOSIS — Z9989 Dependence on other enabling machines and devices: Secondary | ICD-10-CM | POA: Diagnosis not present

## 2017-08-02 LAB — COMPREHENSIVE METABOLIC PANEL
ALBUMIN: 3.6 g/dL (ref 3.5–5.0)
ALT: 29 U/L (ref 14–54)
ANION GAP: 10 (ref 5–15)
AST: 24 U/L (ref 15–41)
Alkaline Phosphatase: 71 U/L (ref 38–126)
BUN: 14 mg/dL (ref 6–20)
CO2: 25 mmol/L (ref 22–32)
Calcium: 9.2 mg/dL (ref 8.9–10.3)
Chloride: 105 mmol/L (ref 101–111)
Creatinine, Ser: 0.73 mg/dL (ref 0.44–1.00)
GFR calc Af Amer: >60 mL/min (ref >60)
GFR calc non Af Amer: >60 mL/min (ref >60)
GLUCOSE: 181 mg/dL — AB (ref 65–99)
POTASSIUM: 3.5 mmol/L (ref 3.5–5.1)
SODIUM: 140 mmol/L (ref 135–145)
Total Bilirubin: 0.7 mg/dL (ref 0.3–1.2)
Total Protein: 6.4 g/dL — ABNORMAL LOW (ref 6.5–8.1)

## 2017-08-02 LAB — CBC WITH DIFFERENTIAL/PLATELET
BASOS ABS: 0 10*3/uL (ref 0.0–0.1)
BASOS PCT: 0 %
EOS ABS: 0.3 10*3/uL (ref 0.0–0.7)
Eosinophils Relative: 4 %
HCT: 40.8 % (ref 36.0–46.0)
Hemoglobin: 13.5 g/dL (ref 12.0–15.0)
Lymphocytes Relative: 40 %
Lymphs Abs: 3 10*3/uL (ref 0.7–4.0)
MCH: 28.9 pg (ref 26.0–34.0)
MCHC: 33.1 g/dL (ref 30.0–36.0)
MCV: 87.4 fL (ref 78.0–100.0)
MONO ABS: 0.4 10*3/uL (ref 0.1–1.0)
MONOS PCT: 6 %
Neutro Abs: 3.8 10*3/uL (ref 1.7–7.7)
Neutrophils Relative %: 50 %
Platelets: 235 10*3/uL (ref 150–400)
RBC: 4.67 MIL/uL (ref 3.87–5.11)
RDW: 14.1 % (ref 11.5–15.5)
WBC: 7.5 10*3/uL (ref 4.0–10.5)

## 2017-08-02 NOTE — Progress Notes (Signed)
Ensure pre surgery drink given with instructions to complete by 0845 dos, pt verbalized understanding. 

## 2017-08-04 ENCOUNTER — Other Ambulatory Visit: Payer: Self-pay

## 2017-08-04 ENCOUNTER — Encounter (HOSPITAL_BASED_OUTPATIENT_CLINIC_OR_DEPARTMENT_OTHER): Payer: Self-pay | Admitting: *Deleted

## 2017-08-04 ENCOUNTER — Ambulatory Visit (HOSPITAL_BASED_OUTPATIENT_CLINIC_OR_DEPARTMENT_OTHER)
Admission: RE | Admit: 2017-08-04 | Discharge: 2017-08-04 | Disposition: A | Discharge status: Home / Self Care | Payer: 59 | Source: Ambulatory Visit | Attending: Surgery | Admitting: Surgery

## 2017-08-04 ENCOUNTER — Encounter (HOSPITAL_BASED_OUTPATIENT_CLINIC_OR_DEPARTMENT_OTHER)
Admission: RE | Disposition: A | Discharge status: Home / Self Care | Payer: Self-pay | Source: Ambulatory Visit | Attending: Surgery

## 2017-08-04 ENCOUNTER — Ambulatory Visit (HOSPITAL_BASED_OUTPATIENT_CLINIC_OR_DEPARTMENT_OTHER): Payer: 59 | Admitting: Anesthesiology

## 2017-08-04 DIAGNOSIS — G4730 Sleep apnea, unspecified: Secondary | ICD-10-CM | POA: Insufficient documentation

## 2017-08-04 DIAGNOSIS — Z9989 Dependence on other enabling machines and devices: Secondary | ICD-10-CM | POA: Insufficient documentation

## 2017-08-04 DIAGNOSIS — D170 Benign lipomatous neoplasm of skin and subcutaneous tissue of head, face and neck: Secondary | ICD-10-CM | POA: Diagnosis not present

## 2017-08-04 DIAGNOSIS — Z7951 Long term (current) use of inhaled steroids: Secondary | ICD-10-CM | POA: Insufficient documentation

## 2017-08-04 DIAGNOSIS — E119 Type 2 diabetes mellitus without complications: Secondary | ICD-10-CM | POA: Insufficient documentation

## 2017-08-04 DIAGNOSIS — Z791 Long term (current) use of non-steroidal anti-inflammatories (NSAID): Secondary | ICD-10-CM | POA: Insufficient documentation

## 2017-08-04 DIAGNOSIS — I10 Essential (primary) hypertension: Secondary | ICD-10-CM | POA: Insufficient documentation

## 2017-08-04 DIAGNOSIS — E039 Hypothyroidism, unspecified: Secondary | ICD-10-CM | POA: Insufficient documentation

## 2017-08-04 DIAGNOSIS — Z79899 Other long term (current) drug therapy: Secondary | ICD-10-CM | POA: Insufficient documentation

## 2017-08-04 DIAGNOSIS — K219 Gastro-esophageal reflux disease without esophagitis: Secondary | ICD-10-CM | POA: Insufficient documentation

## 2017-08-04 DIAGNOSIS — J45909 Unspecified asthma, uncomplicated: Secondary | ICD-10-CM | POA: Insufficient documentation

## 2017-08-04 DIAGNOSIS — E7800 Pure hypercholesterolemia, unspecified: Secondary | ICD-10-CM | POA: Insufficient documentation

## 2017-08-04 DIAGNOSIS — Z6836 Body mass index (BMI) 36.0-36.9, adult: Secondary | ICD-10-CM | POA: Insufficient documentation

## 2017-08-04 DIAGNOSIS — D171 Benign lipomatous neoplasm of skin and subcutaneous tissue of trunk: Secondary | ICD-10-CM | POA: Insufficient documentation

## 2017-08-04 HISTORY — PX: MASS EXCISION: SHX2000

## 2017-08-04 LAB — GLUCOSE, CAPILLARY: GLUCOSE-CAPILLARY: 101 mg/dL — AB (ref 65–99)

## 2017-08-04 SURGERY — EXCISION MASS
Anesthesia: General | Site: Back

## 2017-08-04 MED ORDER — FENTANYL CITRATE (PF) 100 MCG/2ML IJ SOLN: 50.0000 ug | INTRAMUSCULAR | Status: DC | PRN

## 2017-08-04 MED ORDER — SUGAMMADEX SODIUM 500 MG/5ML IV SOLN
INTRAVENOUS | Status: DC | PRN
  Administered 2017-08-04: 300 mg via INTRAVENOUS

## 2017-08-04 MED ORDER — ROCURONIUM BROMIDE 10 MG/ML (PF) SYRINGE
PREFILLED_SYRINGE | INTRAVENOUS | Status: AC
  Filled 2017-08-04: qty 5

## 2017-08-04 MED ORDER — LIDOCAINE HCL (CARDIAC) 20 MG/ML IV SOLN
INTRAVENOUS | Status: DC | PRN
  Administered 2017-08-04: 60 mg via INTRAVENOUS

## 2017-08-04 MED ORDER — MIDAZOLAM HCL 2 MG/2ML IJ SOLN
INTRAMUSCULAR | Status: AC
  Filled 2017-08-04: qty 2

## 2017-08-04 MED ORDER — DEXTROSE 5 % IV SOLN: 3.0000 g | INTRAVENOUS | Status: DC

## 2017-08-04 MED ORDER — SUCCINYLCHOLINE CHLORIDE 200 MG/10ML IV SOSY
PREFILLED_SYRINGE | INTRAVENOUS | Status: DC | PRN
  Administered 2017-08-04: 80 mg via INTRAVENOUS

## 2017-08-04 MED ORDER — PHENYLEPHRINE HCL 10 MG/ML IJ SOLN
INTRAMUSCULAR | Status: DC | PRN
Start: 2017-08-04 — End: 2017-08-04
  Administered 2017-08-04 (×4): 40 ug via INTRAVENOUS

## 2017-08-04 MED ORDER — GLYCOPYRROLATE 0.2 MG/ML IJ SOLN
INTRAMUSCULAR | Status: DC | PRN
  Administered 2017-08-04: 0.2 mg via INTRAVENOUS

## 2017-08-04 MED ORDER — OXYCODONE HCL 5 MG PO TABS: 5.0000 mg | ORAL_TABLET | Freq: Once | ORAL | Status: DC | PRN

## 2017-08-04 MED ORDER — CEFAZOLIN SODIUM-DEXTROSE 2-4 GM/100ML-% IV SOLN
INTRAVENOUS | Status: AC
  Filled 2017-08-04: qty 100

## 2017-08-04 MED ORDER — FENTANYL CITRATE (PF) 100 MCG/2ML IJ SOLN
INTRAMUSCULAR | Status: AC
  Filled 2017-08-04: qty 4

## 2017-08-04 MED ORDER — HYDROCODONE-ACETAMINOPHEN 5-325 MG PO TABS: 1.0000 | ORAL_TABLET | Freq: Four times a day (QID) | ORAL | 0 refills | Status: DC | PRN

## 2017-08-04 MED ORDER — SUGAMMADEX SODIUM 500 MG/5ML IV SOLN
INTRAVENOUS | Status: AC
  Filled 2017-08-04: qty 5

## 2017-08-04 MED ORDER — FENTANYL CITRATE (PF) 100 MCG/2ML IJ SOLN
INTRAMUSCULAR | Status: DC | PRN
  Administered 2017-08-04: 100 ug via INTRAVENOUS
  Administered 2017-08-04: 50 ug via INTRAVENOUS

## 2017-08-04 MED ORDER — PROPOFOL 10 MG/ML IV BOLUS
INTRAVENOUS | Status: DC | PRN
  Administered 2017-08-04: 100 mg via INTRAVENOUS
  Administered 2017-08-04: 70 mg via INTRAVENOUS
  Administered 2017-08-04: 30 mg via INTRAVENOUS

## 2017-08-04 MED ORDER — PROPOFOL 10 MG/ML IV BOLUS
INTRAVENOUS | Status: AC
  Filled 2017-08-04: qty 20

## 2017-08-04 MED ORDER — MIDAZOLAM HCL 2 MG/2ML IJ SOLN: 1.0000 mg | INTRAMUSCULAR | Status: DC | PRN

## 2017-08-04 MED ORDER — EPHEDRINE SULFATE 50 MG/ML IJ SOLN
INTRAMUSCULAR | Status: DC | PRN
  Administered 2017-08-04 (×3): 10 mg via INTRAVENOUS

## 2017-08-04 MED ORDER — FENTANYL CITRATE (PF) 100 MCG/2ML IJ SOLN: 25.0000 ug | INTRAMUSCULAR | Status: DC | PRN

## 2017-08-04 MED ORDER — ACETAMINOPHEN 325 MG PO TABS: 325.0000 mg | ORAL_TABLET | ORAL | Status: DC | PRN

## 2017-08-04 MED ORDER — GABAPENTIN 300 MG PO CAPS
ORAL_CAPSULE | ORAL | Status: AC
  Filled 2017-08-04: qty 1

## 2017-08-04 MED ORDER — LIDOCAINE 2% (20 MG/ML) 5 ML SYRINGE
INTRAMUSCULAR | Status: AC
  Filled 2017-08-04: qty 5

## 2017-08-04 MED ORDER — ACETAMINOPHEN 500 MG PO TABS
1000.0000 mg | ORAL_TABLET | ORAL | Status: AC
  Administered 2017-08-04: 1000 mg via ORAL

## 2017-08-04 MED ORDER — ONDANSETRON HCL 4 MG/2ML IJ SOLN
INTRAMUSCULAR | Status: AC
  Filled 2017-08-04: qty 2

## 2017-08-04 MED ORDER — ACETAMINOPHEN 500 MG PO TABS
ORAL_TABLET | ORAL | Status: AC
  Filled 2017-08-04: qty 2

## 2017-08-04 MED ORDER — PHENYLEPHRINE 40 MCG/ML (10ML) SYRINGE FOR IV PUSH (FOR BLOOD PRESSURE SUPPORT)
PREFILLED_SYRINGE | INTRAVENOUS | Status: AC
  Filled 2017-08-04: qty 10

## 2017-08-04 MED ORDER — BUPIVACAINE-EPINEPHRINE 0.25% -1:200000 IJ SOLN
INTRAMUSCULAR | Status: DC | PRN
  Administered 2017-08-04: 8 mL
  Administered 2017-08-04: 10 mL

## 2017-08-04 MED ORDER — LACTATED RINGERS IV SOLN
INTRAVENOUS | Status: DC
  Administered 2017-08-04: 11:00:00 via INTRAVENOUS

## 2017-08-04 MED ORDER — OXYCODONE HCL 5 MG/5ML PO SOLN: 5.0000 mg | Freq: Once | ORAL | Status: DC | PRN

## 2017-08-04 MED ORDER — LACTATED RINGERS IV SOLN
INTRAVENOUS | Status: DC | PRN
  Administered 2017-08-04: 11:00:00 via INTRAVENOUS

## 2017-08-04 MED ORDER — CHLORHEXIDINE GLUCONATE CLOTH 2 % EX PADS: 6.0000 | MEDICATED_PAD | Freq: Once | CUTANEOUS | Status: DC

## 2017-08-04 MED ORDER — SCOPOLAMINE 1 MG/3DAYS TD PT72: 1.0000 | MEDICATED_PATCH | Freq: Once | TRANSDERMAL | Status: DC | PRN

## 2017-08-04 MED ORDER — CEFAZOLIN SODIUM-DEXTROSE 2-3 GM-%(50ML) IV SOLR
INTRAVENOUS | Status: DC | PRN
  Administered 2017-08-04: 2 g via INTRAVENOUS

## 2017-08-04 MED ORDER — ROCURONIUM BROMIDE 100 MG/10ML IV SOLN
INTRAVENOUS | Status: DC | PRN
  Administered 2017-08-04: 30 mg via INTRAVENOUS

## 2017-08-04 MED ORDER — IBUPROFEN 800 MG PO TABS: 800.0000 mg | ORAL_TABLET | Freq: Three times a day (TID) | ORAL | 0 refills | Status: DC | PRN

## 2017-08-04 MED ORDER — GABAPENTIN 300 MG PO CAPS
300.0000 mg | ORAL_CAPSULE | ORAL | Status: AC
  Administered 2017-08-04: 300 mg via ORAL

## 2017-08-04 MED ORDER — ACETAMINOPHEN 160 MG/5ML PO SOLN: 325.0000 mg | ORAL | Status: DC | PRN

## 2017-08-04 MED ORDER — MIDAZOLAM HCL 2 MG/2ML IJ SOLN
INTRAMUSCULAR | Status: DC | PRN
  Administered 2017-08-04: 2 mg via INTRAVENOUS

## 2017-08-04 MED ORDER — EPHEDRINE 5 MG/ML INJ
INTRAVENOUS | Status: AC
  Filled 2017-08-04: qty 10

## 2017-08-04 MED ORDER — ONDANSETRON HCL 4 MG/2ML IJ SOLN
INTRAMUSCULAR | Status: DC | PRN
  Administered 2017-08-04: 4 mg via INTRAVENOUS

## 2017-08-04 SURGICAL SUPPLY — 45 items
ADH SKN CLS APL DERMABOND .7 (GAUZE/BANDAGES/DRESSINGS) ×1
APL SKNCLS STERI-STRIP NONHPOA (GAUZE/BANDAGES/DRESSINGS)
BENZOIN TINCTURE PRP APPL 2/3 (GAUZE/BANDAGES/DRESSINGS) IMPLANT
BLADE SURG 10 STRL SS (BLADE) IMPLANT
BLADE SURG 15 STRL LF DISP TIS (BLADE) ×1 IMPLANT
BLADE SURG 15 STRL SS (BLADE) ×2
CANISTER SUCT 1200ML W/VALVE (MISCELLANEOUS) IMPLANT
CHLORAPREP W/TINT 26ML (MISCELLANEOUS) ×2 IMPLANT
COVER BACK TABLE 60X90IN (DRAPES) ×2 IMPLANT
COVER MAYO STAND STRL (DRAPES) ×2 IMPLANT
DECANTER SPIKE VIAL GLASS SM (MISCELLANEOUS) IMPLANT
DERMABOND ADVANCED (GAUZE/BANDAGES/DRESSINGS) ×1
DERMABOND ADVANCED .7 DNX12 (GAUZE/BANDAGES/DRESSINGS) IMPLANT
DRAPE LAPAROTOMY 100X72 PEDS (DRAPES) ×2 IMPLANT
DRAPE UTILITY XL STRL (DRAPES) ×2 IMPLANT
ELECT COATED BLADE 2.86 ST (ELECTRODE) ×2 IMPLANT
ELECT REM PT RETURN 9FT ADLT (ELECTROSURGICAL) ×2
ELECTRODE REM PT RTRN 9FT ADLT (ELECTROSURGICAL) ×1 IMPLANT
GLOVE BIOGEL PI IND STRL 7.0 (GLOVE) IMPLANT
GLOVE BIOGEL PI IND STRL 8 (GLOVE) ×1 IMPLANT
GLOVE BIOGEL PI INDICATOR 7.0 (GLOVE) ×2
GLOVE BIOGEL PI INDICATOR 8 (GLOVE) ×1
GLOVE ECLIPSE 8.0 STRL XLNG CF (GLOVE) ×2 IMPLANT
GLOVE SURG SS PI 6.5 STRL IVOR (GLOVE) ×1 IMPLANT
GLOVE SURG SS PI 7.0 STRL IVOR (GLOVE) ×1 IMPLANT
GOWN STRL REUS W/ TWL LRG LVL3 (GOWN DISPOSABLE) ×2 IMPLANT
GOWN STRL REUS W/TWL LRG LVL3 (GOWN DISPOSABLE) ×6
NDL HYPO 25X1 1.5 SAFETY (NEEDLE) ×1 IMPLANT
NEEDLE HYPO 25X1 1.5 SAFETY (NEEDLE) ×2 IMPLANT
NS IRRIG 1000ML POUR BTL (IV SOLUTION) ×1 IMPLANT
PACK BASIN DAY SURGERY FS (CUSTOM PROCEDURE TRAY) ×2 IMPLANT
PENCIL BUTTON HOLSTER BLD 10FT (ELECTRODE) ×2 IMPLANT
SLEEVE SCD COMPRESS KNEE MED (MISCELLANEOUS) ×2 IMPLANT
SPONGE LAP 4X18 X RAY DECT (DISPOSABLE) ×1 IMPLANT
STAPLER VISISTAT 35W (STAPLE) IMPLANT
STRIP CLOSURE SKIN 1/2X4 (GAUZE/BANDAGES/DRESSINGS) IMPLANT
SUT MNCRL AB 4-0 PS2 18 (SUTURE) ×1 IMPLANT
SUT MON AB 4-0 PC3 18 (SUTURE) ×2 IMPLANT
SUT VICRYL 3-0 CR8 SH (SUTURE) ×2 IMPLANT
SUT VICRYL AB 3 0 TIES (SUTURE) IMPLANT
SYR CONTROL 10ML LL (SYRINGE) ×2 IMPLANT
TOWEL OR 17X24 6PK STRL BLUE (TOWEL DISPOSABLE) ×4 IMPLANT
TOWEL OR NON WOVEN STRL DISP B (DISPOSABLE) ×2 IMPLANT
TUBE CONNECTING 20X1/4 (TUBING) IMPLANT
YANKAUER SUCT BULB TIP NO VENT (SUCTIONS) IMPLANT

## 2017-08-04 NOTE — Anesthesia Preprocedure Evaluation (Addendum)
Anesthesia Evaluation  Patient identified by MRN, date of birth, ID band Patient awake    Reviewed: Allergy & Precautions, H&P , NPO status , Patient's Chart, lab work & pertinent test results, reviewed documented beta blocker date and time   History of Anesthesia Complications Negative for: history of anesthetic complications  Airway Mallampati: III  TM Distance: <3 FB Neck ROM: Full    Dental no notable dental hx. (+) Teeth Intact, Dental Advisory Given   Pulmonary neg shortness of breath, asthma , sleep apnea and Continuous Positive Airway Pressure Ventilation ,    breath sounds clear to auscultation       Cardiovascular hypertension, Pt. on medications (-) angina(-) Past MI and (-) CHF  Rhythm:Regular Rate:Normal     Neuro/Psych  Neuromuscular disease negative psych ROS   GI/Hepatic Neg liver ROS, GERD  Medicated,  Endo/Other  diabetes, Type 2Hypothyroidism Morbid obesityobesity  Renal/GU negative Renal ROS     Musculoskeletal negative musculoskeletal ROS (+)   Abdominal   Peds  Hematology negative hematology ROS (+)   Anesthesia Other Findings   Reproductive/Obstetrics                            Anesthesia Physical  Anesthesia Plan  ASA: II  Anesthesia Plan: General   Post-op Pain Management:    Induction: Intravenous  PONV Risk Score and Plan: 3 and Ondansetron and Dexamethasone  Airway Management Planned: Oral ETT and LMA  Additional Equipment: None  Intra-op Plan:   Post-operative Plan: Extubation in OR  Informed Consent: I have reviewed the patients History and Physical, chart, labs and discussed the procedure including the risks, benefits and alternatives for the proposed anesthesia with the patient or authorized representative who has indicated his/her understanding and acceptance.   Dental advisory given  Plan Discussed with: CRNA and Surgeon  Anesthesia Plan  Comments:        Anesthesia Quick Evaluation

## 2017-08-04 NOTE — Anesthesia Procedure Notes (Signed)
Procedure Name: Intubation Date/Time: 08/04/2017 11:19 AM Performed by: Rayvon Char, CRNA Pre-anesthesia Checklist: Patient identified, Emergency Drugs available, Suction available and Timeout performed Patient Re-evaluated:Patient Re-evaluated prior to induction Oxygen Delivery Method: Circle system utilized Preoxygenation: Pre-oxygenation with 100% oxygen Induction Type: IV induction Laryngoscope Size: Glidescope Number of attempts: 1 Airway Equipment and Method: Video-laryngoscopy Placement Confirmation: ETT inserted through vocal cords under direct vision,  positive ETCO2 and breath sounds checked- equal and bilateral Secured at: 22 cm Tube secured with: Tape Dental Injury: Teeth and Oropharynx as per pre-operative assessment  Difficulty Due To: Difficulty was anticipated

## 2017-08-04 NOTE — Transfer of Care (Signed)
Immediate Anesthesia Transfer of Care Note  Patient: [name]  Procedure(s) Performed: EXCISION LIPOMA UPPER BACK x2 AND NECK (N/A Back)  Patient Location: PACU  Anesthesia Type:General  Level of Consciousness: awake, alert  and oriented  Airway & Oxygen Therapy: Patient Spontanous Breathing and Patient connected to face mask oxygen  Post-op Assessment: Report given to RN and Post -op Vital signs reviewed and stable  Post vital signs: Reviewed and stable  Last Vitals:  Vitals:   08/04/17 1036  BP: (!) 144/73  Pulse: 68  Resp: 20  Temp: 36.7 C  SpO2: 98%    Last Pain:  Vitals:   08/04/17 1036  TempSrc: Oral         Complications: No apparent anesthesia complications

## 2017-08-04 NOTE — Op Note (Signed)
Preoperative diagnosis: Lipoma posterior scalp 3 cm, lipoma right shoulder 4 cm, lipoma left shoulder 4 cm, all subcutaneous  Postoperative diagnosis: Same  Procedure: Excision of scalp, right shoulder and left shoulder lipoma  Surgeon: Erroll Luna MD  Anesthesia: General with local  EBL: 20 cc  : Specimens as above  Drains: None  IV fluids: Per anesthesia record  Indications for procedure: The patient presents with multiple painful lipomas.  She desired excision.  Risk, benefits and other options of treatment and observation were discussed.  Risk of surgery and the need for reoperation were discussed with the patient.The procedure has been discussed with the patient.  Alternative therapies have been discussed with the patient.  Operative risks include bleeding,  Infection,  Organ injury,  Nerve injury,  Blood vessel injury,  DVT,  Pulmonary embolism,  Death,  And possible reoperation.  Medical management risks include worsening of present situation.  The success of the procedure is 50 -90 % at treating patients symptoms.  The patient understands and agrees to proceed.  Description of procedure: The patient was met in the holding area.  All 3 lipomas were marked with the assistance of the patient with permanent ink.  Questions were answered.  Risk, benefits and other options were discussed again with the patient.  She wished to proceed with surgical excision of these masses.  She was then taken back to the operating room.  She was placed supine on the stretcher.  After induction of general anesthesia she was placed prone and padded appropriately.  The area of the posterior scalp was shaved.  The areas were prepped and draped in sterile fashion.  She received preoperative antibiotics.  Timeout was done to verify proper patient and procedure.  The scalp lipoma was done first.  Transverse incision was made over the lipoma.  This was a multiloculated lipoma and all components were removed.   Hemostasis achieved.  This was closed with 3-0 Vicryl and 4-0 Monocryl.  The right shoulder lipoma was done next.  A transverse incision was made over that.  This was multiloculated as well.  All components of the lipoma removed.  This was closed with 3-0 Vicryl and 4-0 Monocryl.  The third lipoma left shoulder was removed in a similar.  It too was multiloculated.  These were all subcutaneous.  Local anesthetic was infiltrated around all 3 wounds.  Dermabond applied.  All final counts were found to be correct.  She was then placed supine extubated taken recovery in satisfactory condition.

## 2017-08-04 NOTE — Interval H&P Note (Signed)
History and Physical Interval Note:  08/04/2017 10:43 AM  [name]  has presented today for surgery, with the diagnosis of [place]  The various methods of treatment have been discussed with the patient and family. After consideration of risks, benefits and other options for treatment, the patient has consented to  Procedure(s): EXCISION LIPOMA UPPER BACK x2 AND NECK (N/A) as a surgical intervention .  The patient's history has been reviewed, patient examined, no change in status, stable for surgery.  I have reviewed the patient's chart and labs.  Questions were answered to the patient's satisfaction.     [place]

## 2017-08-04 NOTE — Anesthesia Postprocedure Evaluation (Signed)
Anesthesia Post Note  Patient: [name]  Procedure(s) Performed: EXCISION LIPOMA UPPER BACK x2 AND NECK (N/A Back)     Patient location during evaluation: PACU Anesthesia Type: General Level of consciousness: awake and alert Pain management: pain level controlled Vital Signs Assessment: post-procedure vital signs reviewed and stable Respiratory status: spontaneous breathing, nonlabored ventilation, respiratory function stable and patient connected to nasal cannula oxygen Cardiovascular status: blood pressure returned to baseline and stable Postop Assessment: no apparent nausea or vomiting Anesthetic complications: no    Last Vitals:  Vitals:   08/04/17 1315 08/04/17 1344  BP: 140/73 (!) 141/70  Pulse: 93 94  Resp: (!) 22 18  Temp:  36.5 C  SpO2: 96% 96%    Last Pain:  Vitals:   08/04/17 1344  TempSrc: Oral  PainSc: 0-No pain                 Taitum Menton

## 2017-08-04 NOTE — Discharge Instructions (Signed)
GENERAL SURGERY: POST OP INSTRUCTIONS ° °###################################################################### ° °EAT °Gradually transition to a high fiber diet with a fiber supplement over the next few weeks after discharge.  Start with a pureed / full liquid diet (see below) ° °WALK °Walk an hour a day.  Control your pain to do that.   ° °CONTROL PAIN °Control pain so that you can walk, sleep, tolerate sneezing/coughing, go up/down stairs. ° °HAVE A BOWEL MOVEMENT DAILY °Keep your bowels regular to avoid problems.  OK to try a laxative to override constipation.  OK to use an antidairrheal to slow down diarrhea.  Call if not better after 2 tries ° °CALL IF YOU HAVE PROBLEMS/CONCERNS °Call if you are still struggling despite following these instructions. °Call if you have concerns not answered by these instructions ° °###################################################################### ° ° ° °1. DIET: Follow a light bland diet the first 24 hours after arrival home, such as soup, liquids, crackers, etc.  Be sure to include lots of fluids daily.  Avoid fast food or heavy meals as your are more likely to get nauseated.   °2. Take your usually prescribed home medications unless otherwise directed. °3. PAIN CONTROL: °a. Pain is best controlled by a usual combination of three different methods TOGETHER: °i. Ice/Heat °ii. Over the counter pain medication °iii. Prescription pain medication °b. Most patients will experience some swelling and bruising around the incisions.  Ice packs or heating pads (30-60 minutes up to 6 times a day) will help. Use ice for the first few days to help decrease swelling and bruising, then switch to heat to help relax tight/sore spots and speed recovery.  Some people prefer to use ice alone, heat alone, alternating between ice & heat.  Experiment to what works for you.  Swelling and bruising can take several weeks to resolve.   °c. It is helpful to take an over-the-counter pain medication  regularly for the first few weeks.  Choose one of the following that works best for you: °i. Naproxen (Aleve, etc)  Two 220mg tabs twice a day °ii. Ibuprofen (Advil, etc) Three 200mg tabs four times a day (every meal & bedtime) °iii. Acetaminophen (Tylenol, etc) 500-650mg four times a day (every meal & bedtime) °d. A  prescription for pain medication (such as oxycodone, hydrocodone, etc) should be given to you upon discharge.  Take your pain medication as prescribed.  °i. If you are having problems/concerns with the prescription medicine (does not control pain, nausea, vomiting, rash, itching, etc), please call us [phone] to see if we need to switch you to a different pain medicine that will work better for you and/or control your side effect better. °ii. If you need a refill on your pain medication, please contact your pharmacy.  They will contact our office to request authorization. Prescriptions will not be filled after 5 pm or on week-ends. °4. Avoid getting constipated.  Between the surgery and the pain medications, it is common to experience some constipation.  Increasing fluid intake and taking a fiber supplement (such as Metamucil, Citrucel, FiberCon, MiraLax, etc) 1-2 times a day regularly will usually help prevent this problem from occurring.  A mild laxative (prune juice, Milk of Magnesia, MiraLax, etc) should be taken according to package directions if there are no bowel movements after 48 hours.   °5. Wash / shower every day.  You may shower over the dressings as they are waterproof.  Continue to shower over incision(s) after the dressing is off. °6. Remove your waterproof bandages   5 days after surgery.  You may leave the incision open to air.  You may have skin tapes (Steri Strips) covering the incision(s).  Leave them on until one week, then remove.  You may replace a dressing/Band-Aid to cover the incision for comfort if you wish.  ° ° ° ° °7. ACTIVITIES as tolerated:   °a. You may resume  regular (light) daily activities beginning the next day--such as daily self-care, walking, climbing stairs--gradually increasing activities as tolerated.  If you can walk 30 minutes without difficulty, it is safe to try more intense activity such as jogging, treadmill, bicycling, low-impact aerobics, swimming, etc. °b. Save the most intensive and strenuous activity for last such as sit-ups, heavy lifting, contact sports, etc  Refrain from any heavy lifting or straining until you are off narcotics for pain control.   °c. DO NOT PUSH THROUGH PAIN.  Let pain be your guide: If it hurts to do something, don't do it.  Pain is your body warning you to avoid that activity for another week until the pain goes down. °d. You may drive when you are no longer taking prescription pain medication, you can comfortably wear a seatbelt, and you can safely maneuver your car and apply brakes. °e. You may have sexual intercourse when it is comfortable.  °8. FOLLOW UP in our office °a. Please call [facility] at [phone] to set up an appointment to see your surgeon in the office for a follow-up appointment approximately 2-3 weeks after your surgery. °b. Make sure that you call for this appointment the day you arrive home to insure a convenient appointment time. °9. IF YOU HAVE DISABILITY OR FAMILY LEAVE FORMS, BRING THEM TO THE OFFICE FOR PROCESSING.  DO NOT GIVE THEM TO YOUR DOCTOR. ° ° °WHEN TO CALL US [phone]: °1. Poor pain control °2. Reactions / problems with new medications (rash/itching, nausea, etc)  °3. Fever over 101.5 F (38.5 C) °4. Worsening swelling or bruising °5. Continued bleeding from incision. °6. Increased pain, redness, or drainage from the incision °7. Difficulty breathing / swallowing ° ° The clinic staff is available to answer your questions during regular business hours (8:30am-5pm).  Please don’t hesitate to call and ask to speak to one of our nurses for clinical concerns.  ° If you have a medical emergency,  go to the nearest emergency room or call 911. ° A surgeon from [facility] is always on call at the hospitals ° ° °[facility] °[address], [address], [place], [place]  [address] ? °MAIN: [phone] ? TOLL FREE: [phone] ?  °FAX [phone] °[email or web address] ° ° °Post Anesthesia Home Care Instructions ° °Activity: °Get plenty of rest for the remainder of the day. A responsible individual must stay with you for 24 hours following the procedure.  °For the next 24 hours, DO NOT: °-Drive a car °-Operate machinery °-Drink alcoholic beverages °-Take any medication unless instructed by your physician °-Make any legal decisions or sign important papers. ° °Meals: °Start with liquid foods such as gelatin or soup. Progress to regular foods as tolerated. Avoid greasy, spicy, heavy foods. If nausea and/or vomiting occur, drink only clear liquids until the nausea and/or vomiting subsides. Call your physician if vomiting continues. ° °Special Instructions/Symptoms: °Your throat may feel dry or sore from the anesthesia or the breathing tube placed in your throat during surgery. If this causes discomfort, gargle with warm salt water. The discomfort should disappear within 24 hours. ° °  If you had a scopolamine patch placed behind your ear for the management of post- operative nausea and/or vomiting: ° °1. The medication in the patch is effective for 72 hours, after which it should be removed.  Wrap patch in a tissue and discard in the trash. Wash hands thoroughly with soap and water. °2. You may remove the patch earlier than 72 hours if you experience unpleasant side effects which may include dry mouth, dizziness or visual disturbances. °3. Avoid touching the patch. Wash your hands with soap and water after contact with the patch. °  ° °

## 2017-08-05 ENCOUNTER — Encounter (HOSPITAL_BASED_OUTPATIENT_CLINIC_OR_DEPARTMENT_OTHER): Payer: Self-pay | Admitting: Surgery

## 2018-02-24 ENCOUNTER — Other Ambulatory Visit: Payer: Self-pay | Admitting: Neurosurgery

## 2018-02-24 DIAGNOSIS — M48061 Spinal stenosis, lumbar region without neurogenic claudication: Secondary | ICD-10-CM

## 2018-02-27 ENCOUNTER — Ambulatory Visit
Admission: RE | Admit: 2018-02-27 | Discharge: 2018-02-27 | Disposition: A | Discharge status: Home / Self Care | Payer: 59 | Source: Ambulatory Visit | Attending: Neurosurgery | Admitting: Neurosurgery

## 2018-02-27 DIAGNOSIS — M48061 Spinal stenosis, lumbar region without neurogenic claudication: Secondary | ICD-10-CM

## 2018-03-14 ENCOUNTER — Other Ambulatory Visit: Payer: 59

## 2018-06-10 ENCOUNTER — Ambulatory Visit: Payer: 59 | Admitting: Allergy

## 2018-06-10 ENCOUNTER — Encounter: Payer: Self-pay | Admitting: Allergy

## 2018-06-10 VITALS — BP 120/82 | HR 90 | Resp 16 | Ht 65.5 in | Wt 234.8 lb

## 2018-06-10 DIAGNOSIS — J310 Chronic rhinitis: Secondary | ICD-10-CM | POA: Diagnosis not present

## 2018-06-10 DIAGNOSIS — J4530 Mild persistent asthma, uncomplicated: Secondary | ICD-10-CM

## 2018-06-10 MED ORDER — ALBUTEROL SULFATE HFA 108 (90 BASE) MCG/ACT IN AERS: 2.0000 | INHALATION_SPRAY | RESPIRATORY_TRACT | 3 refills | Status: DC | PRN

## 2018-06-10 NOTE — Progress Notes (Signed)
[address] [place] [address] Dept: [phone]  FOLLOW UP NOTE  Patient ID: [name], female    DOB: 07/09/1950  Age: 67 y.o. MRN: [id number] Date of Office Visit: 06/10/2018  Assessment  Chief Complaint: Asthma  HPI [name] is a 67 year old female who presents to the clinic for a follow up visit. She reports her asthma as well controlled with no shortness of breath, cough or wheeze with activity or rest. She is currently using Flovent 110-2 puffs once a day with out a spacer and taking montelukast 10 mg once a day. She has not used her albuterol inhaler since her last visit to this office. Allergic rhinitis is reported as well controlled with azelastine twice a day and Flonase as needed. She is occasionally using a nasal saline rinse with relief of nasal symptoms. Her current medications are listed in the chart.   Drug Allergies:  No Known Allergies  Physical Exam: BP 120/82 (BP Location: Left Arm, Patient Position: Sitting, Cuff Size: Normal)   Pulse 90   Resp 16   Ht 5' 5.5" (1.664 m)   Wt 234 lb 12.8 oz (106.5 kg)   SpO2 95%   BMI 38.48 kg/m    Physical Exam  Constitutional: She is oriented to person, place, and time. She appears well-developed and well-nourished.  HENT:  Head: Normocephalic.  Right Ear: External ear normal.  Left Ear: External ear normal.  Mouth/Throat: Oropharynx is clear and moist.  Bilateral nares slightly erythematous with clear nasal drainage. Pharynx normal. Ears normal. Eyes normal.   Eyes: Conjunctivae are normal.  Neck: Normal range of motion. Neck supple.  Cardiovascular: Normal rate, regular rhythm and normal heart sounds.  No murmur noted  Pulmonary/Chest: Effort normal and breath sounds normal.  Lungs clear to auscultation  Musculoskeletal: Normal range of motion.  Neurological: She is alert and oriented to person, place, and time.  Skin: Skin is warm and dry.  Psychiatric: She has a normal mood and affect.  Her behavior is normal. Judgment and thought content normal.  Vitals reviewed.   Diagnostics: FVC 2.79, FEV1 2.41. Predicted FVC 3.28, predicted FEV1 2.51. Spirometry is within the normal range.  Assessment and Plan: 1. Mild persistent asthma without complication   2. Mixed rhinitis     Meds ordered this encounter  Medications  . albuterol (PROAIR HFA) 108 (90 Base) MCG/ACT inhaler    Sig: Inhale 2 puffs into the lungs every 4 (four) hours as needed for wheezing or shortness of breath.    Dispense:  1 Inhaler    Refill:  3    Hold. Patient will call when needed    Patient Instructions  Asthma  - Flovent 110 g  2 puffs daily with a spacer to prevent cough or wheeze.   Increase to 2 puffs twice a day dosing during asthma flares/respiratory illnesses  - Continue Singulair 10 mg daily  - ProAir 2 puffs every 4 hours as needed for cough or wheeze Asthma control goals:   Full participation in all desired activities (may need albuterol before activity)  Albuterol use two time or less a week on average (not counting use with activity)  Cough interfering with sleep two time or less a month  Oral steroids no more than once a year  No hospitalizations   Mixed rhinitis  - Continue Singulair as above  - Continue Flonase 1-2 sprays each nostril daily as needed for congestion.  Use for 1-2 weeks at  a time before stopping once symptoms improved  - Continue Astelin 1-2 sprays twice a day for nasal drainage/post-nasal drip  Follow-up in 6-9 months or sooner if needed.   Return in about 6 months (around 12/10/2018), or if symptoms worsen or fail to improve.    Thank you for the opportunity to care for this patient.  Please do not hesitate to contact me with questions.  Gareth Morgan, FNP [facility]

## 2018-06-10 NOTE — Patient Instructions (Addendum)
Asthma  - Flovent 110 g  2 puffs daily with a spacer to prevent cough or wheeze.   Increase to 2 puffs twice a day dosing during asthma flares/respiratory illnesses  - Continue Singulair 10 mg daily  - ProAir 2 puffs every 4 hours as needed for cough or wheeze Asthma control goals:   Full participation in all desired activities (may need albuterol before activity)  Albuterol use two time or less a week on average (not counting use with activity)  Cough interfering with sleep two time or less a month  Oral steroids no more than once a year  No hospitalizations   Mixed rhinitis  - Continue Singulair as above  - Continue Flonase 1-2 sprays each nostril daily as needed for congestion.  Use for 1-2 weeks at a time before stopping once symptoms improved  - Continue Astelin 1-2 sprays twice a day for nasal drainage/post-nasal drip  Follow-up in 6-9 months or sooner if needed.

## 2018-07-04 ENCOUNTER — Other Ambulatory Visit: Payer: Self-pay | Admitting: Family Medicine

## 2018-07-04 ENCOUNTER — Other Ambulatory Visit (HOSPITAL_COMMUNITY)
Admission: RE | Admit: 2018-07-04 | Discharge: 2018-07-04 | Disposition: A | Discharge status: Home / Self Care | Payer: 59 | Source: Ambulatory Visit | Attending: Family Medicine | Admitting: Family Medicine

## 2018-07-04 DIAGNOSIS — Z01411 Encounter for gynecological examination (general) (routine) with abnormal findings: Secondary | ICD-10-CM | POA: Diagnosis not present

## 2018-07-05 ENCOUNTER — Ambulatory Visit: Payer: 59 | Admitting: Obstetrics and Gynecology

## 2018-07-05 ENCOUNTER — Other Ambulatory Visit: Payer: Self-pay

## 2018-07-05 ENCOUNTER — Encounter: Payer: Self-pay | Admitting: Obstetrics and Gynecology

## 2018-07-05 VITALS — BP 150/74 | HR 92 | Resp 14 | Ht 66.0 in | Wt 234.5 lb

## 2018-07-05 DIAGNOSIS — N952 Postmenopausal atrophic vaginitis: Secondary | ICD-10-CM

## 2018-07-05 DIAGNOSIS — N950 Postmenopausal bleeding: Secondary | ICD-10-CM | POA: Diagnosis not present

## 2018-07-05 NOTE — Progress Notes (Signed)
GYNECOLOGY  VISIT   HPI: 67 y.o.   Married  Caucasian  female   [phone] with No LMP recorded. Patient is postmenopausal.   here for   Postmenopausal bleeding that occurred in October 2019 following intercourse.  Had pink discharge in October that lasted one day.  Has occasional left hip cramping.  (Has left hip cramping following a fall and back issues with DJD.)  Thinks she has a history of fibroids. Had bleeding 10 years ago and saw Dr. Marvia Pickles.  Had an office biopsy which was very painful.  Had a dilation and curettage 12/17/08.  Saw her PCP yesterday and had a pap smear.  A1C was 6.5   Reporting urinary incontinence for 6 - 7 years.  On Toviaz, which helps.  No real leakage with cough, laugh, sneeze, run, jump.  Does wear a pad all the time.   Sister with uterine cancer. States she had a hysterectomy and chemotherapy in [place].   Retiring today.  She is an Therapist, sports.  GYNECOLOGIC HISTORY: No LMP recorded. Patient is postmenopausal. Contraception:  Post menopausal  Menopausal hormone therapy:  None  Last mammogram:  03/2018 at [facility], normal per patient Last pap smear:   07-04-18 at Dr. Inda Merlin office.  No abnormal paps in the past.   Colonoscopy:  Due in Sept. 2020.          OB History    Gravida  3   Para      Term      Preterm      AB  1   Living  2     SAB  1   TAB      Ectopic      Multiple      Live Births                 Patient Active Problem List   Diagnosis Date Noted  . Mild persistent asthma without complication 69/48/5462  . Mixed rhinitis 06/10/2018  . Obstructive sleep apnea 12/11/2016  . Palpitations 06/17/2016  . Deflected nasal septum 09/13/2015  . Hypertrophy of nasal turbinates 09/13/2015  . Deviated nasal septum 06/21/2015    Class: Chronic  . HYPERLIPIDEMIA 11/18/2007  . Essential hypertension 11/18/2007  . Allergic rhinitis 11/18/2007  . COUGH 11/18/2007    Past Medical History:  Diagnosis Date  . Asthma   . Chronic  back pain   . Diabetes mellitus without complication ([place])    no med  . GERD (gastroesophageal reflux disease)   . Hyperlipemia   . Hypertension   . Hypothyroidism   . Insomnia   . OAB (overactive bladder)   . Seasonal allergies   . Sleep apnea    uses a cpap    Past Surgical History:  Procedure Laterality Date  . ANKLE ARTHROTOMY  2003   left-fx  . APPENDECTOMY    . BACK SURGERY  2010   lumb disk  . CARPAL TUNNEL RELEASE     rt  . CARPAL TUNNEL RELEASE Left 08/01/2013   Procedure: LEFT CARPAL TUNNEL RELEASE;  Surgeon: Wynonia Sours, MD;  Location: [facility];  Service: Orthopedics;  Laterality: Left;  . CESAREAN SECTION  83,86  . COLONOSCOPY    . DILATION AND CURETTAGE OF UTERUS     x2  . MASS EXCISION N/A 08/04/2017   Procedure: EXCISION LIPOMA UPPER BACK x2 AND NECK;  Surgeon: Erroll Luna, MD;  Location: [place];  Service: General;  Laterality: N/A;  .  NASAL SEPTOPLASTY W/ TURBINOPLASTY Bilateral 06/21/2015   Procedure: NASAL SEPTOPLASTY WITH BILATERAL TURBINATE REDUCTION;  Surgeon: Jerrell Belfast, MD;  Location: [place];  Service: ENT;  Laterality: Bilateral;  . SINOSCOPY    . TONSILLECTOMY    . TUBAL LIGATION      Current Outpatient Medications  Medication Sig Dispense Refill  . 5-Hydroxytryptophan (5-HTP MAXIMUM STRENGTH) 200 MG CAPS Take 200 mg by mouth daily.    Marland Kitchen albuterol (PROAIR HFA) 108 (90 Base) MCG/ACT inhaler Inhale 2 puffs into the lungs every 4 (four) hours as needed for wheezing or shortness of breath. 1 Inhaler 3  . amLODipine (NORVASC) 5 MG tablet Take 5 mg by mouth daily.    Marland Kitchen azelastine (ASTELIN) 0.1 % nasal spray USE 2 SPRAYS IN EACH  NOSTRIL TWO TIMES DAILY 60 mL 5  . CINNAMON PO 2 capsules Orally once a day    . cyclobenzaprine (FLEXERIL) 10 MG tablet 1 tablet Orally twice a day as needed for pain    . econazole nitrate 1 % cream 1 application to affected area Externally Once a day for 4 weeks    .  famotidine-calcium carbonate-magnesium hydroxide (PEPCID COMPLETE) 10-800-165 MG chewable tablet 1 tablet Orally Twice a day    . fesoterodine (TOVIAZ) 8 MG TB24 tablet Take 8 mg by mouth at bedtime.     . fluticasone (FLOVENT HFA) 110 MCG/ACT inhaler Inhale 2 puffs into the lungs 2 (two) times daily. 1 Inhaler 5  . gabapentin (NEURONTIN) 300 MG capsule Take 600 mg by mouth 3 (three) times daily.     Marland Kitchen GLUCOSAMINE-CHONDROITIN PO Take 1 tablet by mouth daily. Move Free Ultra Triple Action    . glucose blood test strip as directed finger stick once a day for 90 days    . hydrochlorothiazide (MICROZIDE) 12.5 MG capsule Take 12.5 mg by mouth daily.    . hydrocortisone-pramoxine (PROCTOFOAM HC) rectal foam 1 application as needed Rectal Four times a day if needed for hemorrhoid for 7 days    . levothyroxine (SYNTHROID, LEVOTHROID) 100 MCG tablet     . losartan (COZAAR) 100 MG tablet Take 100 mg by mouth daily.    . Melatonin 3 MG TABS Take 3 mg by mouth as needed.    . meloxicam (MOBIC) 7.5 MG tablet Take 7.5 mg by mouth See admin instructions. Take 1 tablet (7.5 mg) by mouth every morning, may take an additional tablet at bedtime as needed for pain    . metFORMIN (GLUCOPHAGE-XR) 500 MG 24 hr tablet 1 tablet with evening meal Orally Once a day for 90 days    . montelukast (SINGULAIR) 10 MG tablet Take 10 mg by mouth at bedtime.    . Multiple Vitamin (MULTIVITAMIN WITH MINERALS) TABS tablet Take 1 tablet by mouth at bedtime.    . NON FORMULARY Hemp Extract Cream Externally three times a day as needed for back and hip pain    . Omega-3 Fatty Acids (FISH OIL) 1200 MG CAPS Take 1,200 mg by mouth daily.    . simvastatin (ZOCOR) 20 MG tablet Take 20 mg by mouth at bedtime.     . Turmeric 500 MG CAPS 2 capsules Orally once a day    . zolpidem (AMBIEN) 10 MG tablet Take 5 mg by mouth at bedtime as needed for sleep.      No current facility-administered medications for this visit.      ALLERGIES: Patient  has no known allergies.  Family History  Problem Relation Age  of Onset  . Allergic rhinitis Father   . Allergic rhinitis Sister   . Asthma Sister   . Uterine cancer Sister 22  . Asthma Paternal Aunt   . Multiple myeloma Sister   . Angioedema Neg Hx   . Eczema Neg Hx   . Immunodeficiency Neg Hx   . Urticaria Neg Hx     Social History   Socioeconomic History  . Marital status: Married    Spouse name: Not on file  . Number of children: Not on file  . Years of education: Not on file  . Highest education level: Not on file  Occupational History  . Not on file  Social Needs  . Financial resource strain: Not on file  . Food insecurity:    Worry: Not on file    Inability: Not on file  . Transportation needs:    Medical: Not on file    Non-medical: Not on file  Tobacco Use  . Smoking status: Never Smoker  . Smokeless tobacco: Never Used  Substance and Sexual Activity  . Alcohol use: Yes    Comment: occ  . Drug use: No  . Sexual activity: Yes    Birth control/protection: Post-menopausal  Lifestyle  . Physical activity:    Days per week: Not on file    Minutes per session: Not on file  . Stress: Not on file  Relationships  . Social connections:    Talks on phone: Not on file    Gets together: Not on file    Attends religious service: Not on file    Active member of club or organization: Not on file    Attends meetings of clubs or organizations: Not on file    Relationship status: Not on file  . Intimate partner violence:    Fear of current or ex partner: Not on file    Emotionally abused: Not on file    Physically abused: Not on file    Forced sexual activity: Not on file  Other Topics Concern  . Not on file  Social History Narrative  . Not on file    Review of Systems  Constitutional: Negative.   HENT: Negative.   Eyes: Negative.   Respiratory: Negative.   Cardiovascular: Negative.   Gastrointestinal: Negative.   Endocrine: Negative.   Genitourinary:  Positive for vaginal bleeding.       Vaginal bleeding- last occurred in 04/2018.  Musculoskeletal: Negative.   Skin: Negative.   Allergic/Immunologic: Negative.   Neurological: Negative.   Hematological: Negative.   Psychiatric/Behavioral: Negative.     PHYSICAL EXAMINATION:    BP (!) 150/74 (BP Location: Left Arm, Patient Position: Sitting, Cuff Size: Large)   Pulse 92   Resp 14   Ht _0  (1.676 m)   Wt 234 lb 8 oz (106.4 kg)   BMI 37.85 kg/m     General appearance: alert, cooperative and appears stated age Head: Normocephalic, without obvious abnormality, atraumatic Neck: no adenopathy, supple, symmetrical, trachea midline and thyroid normal to inspection and palpation Lungs: clear to auscultation bilaterally Heart: regular rate and rhythm Abdomen: soft, non-tender, no masses,  no organomegaly Extremities: extremities normal, atraumatic, no cyanosis or edema No abnormal inguinal nodes palpated Neurologic: Grossly normal  Pelvic: External genitalia:  no lesions              Urethra:  normal appearing urethra with no masses, tenderness or lesions              Bartholins  and Skenes: normal                 Vagina:  Significant atrophy of the vagina and cervix.  Bled with placement of speculum today.               Cervix: no lesions.  Stenotic.                 Bimanual Exam:  Uterus:  normal size, contour, position, consistency, mobility, non-tender              Adnexa: no mass, fullness, tenderness              BM exam limited by body habitus.              Rectal exam: Yes.  .  Confirms.              Anus:  normal sphincter tone, no lesions  Chaperone was present for exam.  ASSESSMENT  Postmenopausal bleeding.  Atrophy.  Cervical stenosis on visual exam. Hx C/S x 2.  Obesity.  FH uterine cancer in sister.  Urinary incontinence.  Overactive bladder.  On Toviaz.  DM. Controlled on Metformin.   PLAN  Discussed postmenopausal bleeding and atrophy.  Will return for  pelvic ultrasound and possible EMB.  If need endometrial pathology assessment, will likely need hysteroscopy with dilation and curettage.  Questions invited and answered.  An After Visit Summary was printed and given to the patient.  __30____ minutes face to face time of which over 50% was spent in counseling.

## 2018-07-05 NOTE — Progress Notes (Signed)
Patient scheduled for PUS on 07-07-18 at 1100 with 1130 consult with Dr. Quincy Simmonds. Patient in office when scheduled and agreeable to date and time of appointment.

## 2018-07-05 NOTE — Patient Instructions (Signed)
Endometrial Biopsy  Endometrial biopsy is a procedure in which a tissue sample is taken from inside the uterus. The sample is taken from the endometrium, which is the lining of the uterus. The tissue sample is then checked under a microscope to see if the tissue is normal or abnormal. This procedure helps to determine where you are in your menstrual cycle and how hormone levels are affecting the lining of the uterus. This procedure may also be used to evaluate uterine bleeding or to diagnose endometrial cancer, endometrial tuberculosis, polyps, or other inflammatory conditions. Tell a health care provider about: Any allergies you have. All medicines you are taking, including vitamins, herbs, eye drops, creams, and over-the-counter medicines. Any problems you or family members have had with anesthetic medicines. Any blood disorders you have. Any surgeries you have had. Any medical conditions you have. Whether you are pregnant or may be pregnant. What are the risks? Generally, this is a safe procedure. However, problems may occur, including: Bleeding. Pelvic infection. Puncture of the wall of the uterus with the biopsy device (rare). What happens before the procedure? Keep a record of your menstrual cycles as told by your health care provider. You may need to schedule your procedure for a specific time in your cycle. You may want to bring a sanitary pad to wear after the procedure. Ask your health care provider about: Changing or stopping your regular medicines. This is especially important if you are taking diabetes medicines or blood thinners. Taking medicines such as aspirin and ibuprofen. These medicines can thin your blood. Do not take these medicines before your procedure if your health care provider instructs you not to. Plan to have someone take you home from the hospital or clinic. What happens during the procedure? To lower your risk of infection: Your health care team will wash or  sanitize their hands. You will lie on an exam table with your feet and legs supported as in a pelvic exam. Your health care provider will insert an instrument (speculum) into your vagina to see your cervix. Your cervix will be cleansed with an antiseptic solution. A medicine (local anesthetic) will be used to numb the cervix. A forceps instrument (tenaculum) will be used to hold your cervix steady for the biopsy. A thin, rod-like instrument (uterine sound) will be inserted through your cervix to determine the length of your uterus and the location where the biopsy sample will be removed. A thin, flexible tube (catheter) will be inserted through your cervix and into the uterus. The catheter will be used to collect the biopsy sample from your endometrial tissue. The catheter and speculum will then be removed, and the tissue sample will be sent to a lab for examination. What happens after the procedure? You will rest in a recovery area until you are ready to go home. You may have mild cramping and a small amount of vaginal bleeding. This is normal. It is up to you to get the results of your procedure. Ask your health care provider, or the department that is doing the procedure, when your results will be ready. Summary Endometrial biopsy is a procedure in which a tissue sample is taken from the endometrium, which is the lining of the uterus. This procedure may help to diagnose menstrual cycle problems, abnormal bleeding, or other conditions affecting the endometrium. Before the procedure, keep a record of your menstrual cycles as told by your health care provider. The tissue sample that is removed will be checked under a microscope  to see if it is normal or abnormal. This information is not intended to replace advice given to you by your health care provider. Make sure you discuss any questions you have with your health care provider. Document Released: 10/23/2004 Document Revised: 07/08/2016 Document  Reviewed: 07/08/2016 Elsevier Interactive Patient Education  2019 [employer] Postmenopausal Bleeding  Postmenopausal bleeding is any bleeding that a woman has after she has entered into menopause. Menopause is the end of a woman's fertile years. After menopause, a woman no longer ovulates and does not have menstrual periods. Postmenopausal bleeding may have various causes, including:  Menopausal hormone therapy (MHT).  Endometrial atrophy. After menopause, low estrogen hormone levels cause the membrane that lines the uterus (endometrium) to become thinner. You may have bleeding as the endometrium thins.  Endometrial hyperplasia. This condition is caused by excess estrogen hormones and low levels of progesterone hormones. The excess estrogen causes the endometrium to thicken, which can lead to bleeding. In some cases, this can lead to cancer of the uterus.  Endometrial cancer.  Non-cancerous growths (polyps) on the endometrium, the lining of the uterus, or the cervix.  Uterine fibroids. These are non-cancerous growths in or around the uterus muscle tissue that can cause heavy bleeding. Any type of postmenopausal bleeding, even if it appears to be a typical menstrual period, should be evaluated by your health care provider. Treatment will depend on the cause of the bleeding. Follow these instructions at home:  Pay attention to any changes in your symptoms.  Avoid using tampons and douches as told by your health care provider.  Change your pads regularly.  Get regular pelvic exams and Pap tests.  Take iron supplements as told by your health care provider.  Take over-the-counter and prescription medicines only as told by your health care provider.  Keep all follow-up visits as told by your health care provider. This is important. Contact a health care provider if:  Your bleeding lasts more than 1 week.  You have abdominal pain.  You have bleeding with or after sexual  intercourse.  You have bleeding that happens more often than every 3 weeks. Get help right away if:  You have a fever, chills, headache, dizziness, muscle aches, and bleeding.  You have severe pain with bleeding.  You are passing blood clots.  You have heavy bleeding, need more than 1 pad an hour, and have never experienced this before.  You feel faint. Summary  Postmenopausal bleeding is any bleeding that a woman has after she has entered into menopause.  Postmenopausal bleeding may have various causes. Treatment will depend on the cause of the bleeding.  Any type of postmenopausal bleeding, even if it appears to be a typical menstrual period, should be evaluated by your health care provider.  Be sure to pay attention to any changes in your symptoms and keep all follow-up visits as told by your health care provider. This information is not intended to replace advice given to you by your health care provider. Make sure you discuss any questions you have with your health care provider. Document Released: 09/30/2005 Document Revised: 09/15/2016 Document Reviewed: 09/15/2016 Elsevier Interactive Patient Education  2019 [employer].

## 2018-07-07 ENCOUNTER — Ambulatory Visit (INDEPENDENT_AMBULATORY_CARE_PROVIDER_SITE_OTHER): Payer: Medicare HMO

## 2018-07-07 ENCOUNTER — Telehealth: Payer: Self-pay | Admitting: Obstetrics and Gynecology

## 2018-07-07 ENCOUNTER — Other Ambulatory Visit: Payer: Self-pay

## 2018-07-07 ENCOUNTER — Ambulatory Visit: Payer: Medicare HMO | Admitting: Obstetrics and Gynecology

## 2018-07-07 VITALS — BP 122/70 | HR 88 | Ht 66.0 in | Wt 233.0 lb

## 2018-07-07 DIAGNOSIS — N950 Postmenopausal bleeding: Secondary | ICD-10-CM

## 2018-07-07 DIAGNOSIS — D219 Benign neoplasm of connective and other soft tissue, unspecified: Secondary | ICD-10-CM

## 2018-07-07 DIAGNOSIS — N952 Postmenopausal atrophic vaginitis: Secondary | ICD-10-CM

## 2018-07-07 LAB — CYTOLOGY - PAP: Diagnosis: NEGATIVE

## 2018-07-07 MED ORDER — MISOPROSTOL 200 MCG PO TABS
ORAL_TABLET | ORAL | 0 refills | Status: DC
Start: 2018-07-07 — End: 2018-08-23

## 2018-07-07 NOTE — Telephone Encounter (Signed)
Call placed to convey benefits for ultrasound and possible EMB

## 2018-07-07 NOTE — Progress Notes (Signed)
GYNECOLOGY  VISIT   HPI: 68 y.o.   Married  Caucasian  female   [phone] with No LMP recorded. Patient is postmenopausal.   here for pelvic ultrasound and possible EMB.    Present this week reporting episode of postmenopausal bleeding following intercourse in October 2019.   No further bleeding.   Prior resection of endometrial mass and fibroid noted with Dr. Joan Flores several years ago.  Patient's sister had uterine cancer.  She had hysterectomy and chemotherapy.   GYNECOLOGIC HISTORY: No LMP recorded. Patient is postmenopausal. Contraception:  Postmenopausal Menopausal hormone therapy:  none Last mammogram: 03/2018 at [facility], normal per patient  Last pap smear: 07-04-18 Neg with Dr.D.Gates, 06-01-16 Neg:Neg HR HPV        OB History    Gravida  3   Para      Term      Preterm      AB  1   Living  2     SAB  1   TAB      Ectopic      Multiple      Live Births                 Patient Active Problem List   Diagnosis Date Noted  . Mild persistent asthma without complication 67/67/2094  . Mixed rhinitis 06/10/2018  . Obstructive sleep apnea 12/11/2016  . Palpitations 06/17/2016  . Deflected nasal septum 09/13/2015  . Hypertrophy of nasal turbinates 09/13/2015  . Deviated nasal septum 06/21/2015    Class: Chronic  . HYPERLIPIDEMIA 11/18/2007  . Essential hypertension 11/18/2007  . Allergic rhinitis 11/18/2007  . COUGH 11/18/2007    Past Medical History:  Diagnosis Date  . Asthma   . Chronic back pain   . Diabetes mellitus without complication ([place])    no med  . GERD (gastroesophageal reflux disease)   . Hyperlipemia   . Hypertension   . Hypothyroidism   . Insomnia   . OAB (overactive bladder)   . Seasonal allergies   . Sleep apnea    uses a cpap    Past Surgical History:  Procedure Laterality Date  . ANKLE ARTHROTOMY  2003   left-fx  . APPENDECTOMY    . BACK SURGERY  2010   lumb disk  . CARPAL TUNNEL RELEASE     rt  . CARPAL TUNNEL  RELEASE Left 08/01/2013   Procedure: LEFT CARPAL TUNNEL RELEASE;  Surgeon: Wynonia Sours, MD;  Location: [place];  Service: Orthopedics;  Laterality: Left;  . CESAREAN SECTION  83,86  . COLONOSCOPY    . DILATION AND CURETTAGE OF UTERUS     x2  . MASS EXCISION N/A 08/04/2017   Procedure: EXCISION LIPOMA UPPER BACK x2 AND NECK;  Surgeon: Erroll Luna, MD;  Location: [place];  Service: General;  Laterality: N/A;  . NASAL SEPTOPLASTY W/ TURBINOPLASTY Bilateral 06/21/2015   Procedure: NASAL SEPTOPLASTY WITH BILATERAL TURBINATE REDUCTION;  Surgeon: Jerrell Belfast, MD;  Location: [place];  Service: ENT;  Laterality: Bilateral;  . SINOSCOPY    . TONSILLECTOMY    . TUBAL LIGATION      Current Outpatient Medications  Medication Sig Dispense Refill  . 5-Hydroxytryptophan (5-HTP MAXIMUM STRENGTH) 200 MG CAPS Take 200 mg by mouth daily.    Marland Kitchen albuterol (PROAIR HFA) 108 (90 Base) MCG/ACT inhaler Inhale 2 puffs into the lungs every 4 (four) hours as needed for wheezing or shortness of breath. 1 Inhaler 3  .  amLODipine (NORVASC) 5 MG tablet Take 5 mg by mouth daily.    Marland Kitchen azelastine (ASTELIN) 0.1 % nasal spray USE 2 SPRAYS IN EACH  NOSTRIL TWO TIMES DAILY 60 mL 5  . CINNAMON PO 2 capsules Orally once a day    . cyclobenzaprine (FLEXERIL) 10 MG tablet 1 tablet Orally twice a day as needed for pain    . econazole nitrate 1 % cream 1 application to affected area Externally Once a day for 4 weeks    . famotidine-calcium carbonate-magnesium hydroxide (PEPCID COMPLETE) 10-800-165 MG chewable tablet 1 tablet Orally Twice a day    . fesoterodine (TOVIAZ) 8 MG TB24 tablet Take 8 mg by mouth at bedtime.     . fluticasone (FLOVENT HFA) 110 MCG/ACT inhaler Inhale 2 puffs into the lungs 2 (two) times daily. 1 Inhaler 5  . gabapentin (NEURONTIN) 300 MG capsule Take 600 mg by mouth 3 (three) times daily.     Marland Kitchen GLUCOSAMINE-CHONDROITIN PO Take 1 tablet by mouth daily. Move Free Ultra  Triple Action    . glucose blood test strip as directed finger stick once a day for 90 days    . hydrochlorothiazide (MICROZIDE) 12.5 MG capsule Take 12.5 mg by mouth daily.    . hydrocortisone-pramoxine (PROCTOFOAM HC) rectal foam 1 application as needed Rectal Four times a day if needed for hemorrhoid for 7 days    . levothyroxine (SYNTHROID, LEVOTHROID) 100 MCG tablet     . losartan (COZAAR) 100 MG tablet Take 100 mg by mouth daily.    . Melatonin 3 MG TABS Take 3 mg by mouth as needed.    . meloxicam (MOBIC) 7.5 MG tablet Take 7.5 mg by mouth See admin instructions. Take 1 tablet (7.5 mg) by mouth every morning, may take an additional tablet at bedtime as needed for pain    . metFORMIN (GLUCOPHAGE-XR) 500 MG 24 hr tablet 1 tablet with evening meal Orally Once a day for 90 days    . montelukast (SINGULAIR) 10 MG tablet Take 10 mg by mouth at bedtime.    . Multiple Vitamin (MULTIVITAMIN WITH MINERALS) TABS tablet Take 1 tablet by mouth at bedtime.    . NON FORMULARY Hemp Extract Cream Externally three times a day as needed for back and hip pain    . Omega-3 Fatty Acids (FISH OIL) 1200 MG CAPS Take 1,200 mg by mouth daily.    . simvastatin (ZOCOR) 20 MG tablet Take 20 mg by mouth at bedtime.     . Turmeric 500 MG CAPS 2 capsules Orally once a day    . zolpidem (AMBIEN) 10 MG tablet Take 5 mg by mouth at bedtime as needed for sleep.      No current facility-administered medications for this visit.      ALLERGIES: Patient has no known allergies.  Family History  Problem Relation Age of Onset  . Allergic rhinitis Father   . Allergic rhinitis Sister   . Asthma Sister   . Uterine cancer Sister 41  . Asthma Paternal Aunt   . Multiple myeloma Sister   . Angioedema Neg Hx   . Eczema Neg Hx   . Immunodeficiency Neg Hx   . Urticaria Neg Hx     Social History   Socioeconomic History  . Marital status: Married    Spouse name: Not on file  . Number of children: Not on file  . Years of  education: Not on file  . Highest education level: Not on file  Occupational History  . Not on file  Social Needs  . Financial resource strain: Not on file  . Food insecurity:    Worry: Not on file    Inability: Not on file  . Transportation needs:    Medical: Not on file    Non-medical: Not on file  Tobacco Use  . Smoking status: Never Smoker  . Smokeless tobacco: Never Used  Substance and Sexual Activity  . Alcohol use: Yes    Comment: occ  . Drug use: No  . Sexual activity: Yes    Birth control/protection: Post-menopausal  Lifestyle  . Physical activity:    Days per week: Not on file    Minutes per session: Not on file  . Stress: Not on file  Relationships  . Social connections:    Talks on phone: Not on file    Gets together: Not on file    Attends religious service: Not on file    Active member of club or organization: Not on file    Attends meetings of clubs or organizations: Not on file    Relationship status: Not on file  . Intimate partner violence:    Fear of current or ex partner: Not on file    Emotionally abused: Not on file    Physically abused: Not on file    Forced sexual activity: Not on file  Other Topics Concern  . Not on file  Social History Narrative  . Not on file    Review of Systems  All other systems reviewed and are negative.   PHYSICAL EXAMINATION:    BP 122/70 (BP Location: Right Arm, Patient Position: Sitting, Cuff Size: Large)   Pulse 88   Ht _0  (1.676 m)   Wt 233 lb (105.7 kg)   BMI 37.61 kg/m     General appearance: alert, cooperative and appears stated age  Pelvic: External genitalia:  no lesions              Urethra:  normal appearing urethra with no masses, tenderness or lesions              Bartholins and Skenes: normal                 Vagina: normal appearing vagina with normal color and discharge, no lesions              Cervix: no lesions.  Os noted.  Petechiae of cervix and vagina with speculum exam.                  Bimanual Exam:  Uterus:  normal size, contour, position, consistency, mobility, non-tender              Adnexa: no mass, fullness, tenderness             Pelvic US Multiple fibroids of the uterus.  Largest 29 mm.  EMS difficult to see due to fibroids.  Ovaries normal. No free fluid.  Chaperone was present for exam.  ASSESSMENT  Postmenopausal bleeding.  Atrophy.  Fibroids.  FH uterine cancer.   PLAN  We discussed her fibroids and atrophy.  Proceed with sonohysterogram and consider possible EMB using small pipelle. Cytotec 200 mcg pv at hs the night prior to the procedure and then the am of the procedure.  Instructed in use. We discussed hysteroscopy with dilation and curettage as an alternative.  No treatment of atrophy until evaluation is complete.   An After Visit Summary was printed and  given to the patient.  __25____ minutes face to face time of which over 50% was spent in counseling.

## 2018-07-08 ENCOUNTER — Encounter: Payer: Self-pay | Admitting: Obstetrics and Gynecology

## 2018-07-12 DIAGNOSIS — J019 Acute sinusitis, unspecified: Secondary | ICD-10-CM | POA: Diagnosis not present

## 2018-07-12 DIAGNOSIS — M48061 Spinal stenosis, lumbar region without neurogenic claudication: Secondary | ICD-10-CM | POA: Diagnosis not present

## 2018-07-12 DIAGNOSIS — J4530 Mild persistent asthma, uncomplicated: Secondary | ICD-10-CM | POA: Diagnosis not present

## 2018-07-12 DIAGNOSIS — M47812 Spondylosis without myelopathy or radiculopathy, cervical region: Secondary | ICD-10-CM | POA: Diagnosis not present

## 2018-07-12 DIAGNOSIS — J209 Acute bronchitis, unspecified: Secondary | ICD-10-CM | POA: Diagnosis not present

## 2018-07-21 ENCOUNTER — Ambulatory Visit (INDEPENDENT_AMBULATORY_CARE_PROVIDER_SITE_OTHER): Payer: Medicare HMO

## 2018-07-21 ENCOUNTER — Ambulatory Visit (INDEPENDENT_AMBULATORY_CARE_PROVIDER_SITE_OTHER): Payer: Medicare HMO | Admitting: Obstetrics and Gynecology

## 2018-07-21 ENCOUNTER — Encounter: Payer: Self-pay | Admitting: Obstetrics and Gynecology

## 2018-07-21 VITALS — BP 138/80 | HR 84 | Resp 16 | Ht 66.0 in | Wt 233.0 lb

## 2018-07-21 DIAGNOSIS — N950 Postmenopausal bleeding: Secondary | ICD-10-CM

## 2018-07-21 DIAGNOSIS — D250 Submucous leiomyoma of uterus: Secondary | ICD-10-CM

## 2018-07-21 DIAGNOSIS — N858 Other specified noninflammatory disorders of uterus: Secondary | ICD-10-CM | POA: Diagnosis not present

## 2018-07-21 NOTE — Patient Instructions (Signed)

## 2018-07-21 NOTE — Progress Notes (Addendum)
GYNECOLOGY  VISIT   HPI: 68 y.o.   Married  Caucasian  female   [phone] with No LMP recorded. Patient is postmenopausal.   here for a sonohysterogram and EMB for post menopausal bleeding.   She may be having another back surgery with Dr. Larose Hires.  She had DJD of the lower spine.   States she had a hysteroscopic myomectomy with Dr. Marvia Pickles in 2010.  GYNECOLOGIC HISTORY: No LMP recorded. Patient is postmenopausal. Contraception:  Postmenopausal Menopausal hormone therapy:  none Last mammogram:  03/2018 at [facility], normal per patient  Last pap smear:   07-04-18 Neg with Dr.D.Gates, 06-01-16 Neg:Neg HR HPV        OB History    Gravida  3   Para      Term      Preterm      AB  1   Living  2     SAB  1   TAB      Ectopic      Multiple      Live Births                 Patient Active Problem List   Diagnosis Date Noted  . Mild persistent asthma without complication 37/90/2409  . Mixed rhinitis 06/10/2018  . Obstructive sleep apnea 12/11/2016  . Palpitations 06/17/2016  . Deflected nasal septum 09/13/2015  . Hypertrophy of nasal turbinates 09/13/2015  . Deviated nasal septum 06/21/2015    Class: Chronic  . HYPERLIPIDEMIA 11/18/2007  . Essential hypertension 11/18/2007  . Allergic rhinitis 11/18/2007  . COUGH 11/18/2007    Past Medical History:  Diagnosis Date  . Asthma   . Chronic back pain   . Diabetes mellitus without complication ([place])    no med  . GERD (gastroesophageal reflux disease)   . Hyperlipemia   . Hypertension   . Hypothyroidism   . Insomnia   . OAB (overactive bladder)   . Seasonal allergies   . Sleep apnea    uses a cpap    Past Surgical History:  Procedure Laterality Date  . ANKLE ARTHROTOMY  2003   left-fx  . APPENDECTOMY    . BACK SURGERY  2010   lumb disk  . CARPAL TUNNEL RELEASE     rt  . CARPAL TUNNEL RELEASE Left 08/01/2013   Procedure: LEFT CARPAL TUNNEL RELEASE;  Surgeon: Wynonia Sours, MD;  Location: [place];  Service: Orthopedics;  Laterality: Left;  . CESAREAN SECTION  83,86  . COLONOSCOPY    . DILATION AND CURETTAGE OF UTERUS     x2  . MASS EXCISION N/A 08/04/2017   Procedure: EXCISION LIPOMA UPPER BACK x2 AND NECK;  Surgeon: Erroll Luna, MD;  Location: [place];  Service: General;  Laterality: N/A;  . NASAL SEPTOPLASTY W/ TURBINOPLASTY Bilateral 06/21/2015   Procedure: NASAL SEPTOPLASTY WITH BILATERAL TURBINATE REDUCTION;  Surgeon: Jerrell Belfast, MD;  Location: [place];  Service: ENT;  Laterality: Bilateral;  . SINOSCOPY    . TONSILLECTOMY    . TUBAL LIGATION      Current Outpatient Medications  Medication Sig Dispense Refill  . 5-Hydroxytryptophan (5-HTP MAXIMUM STRENGTH) 200 MG CAPS Take 200 mg by mouth daily.    Marland Kitchen albuterol (PROAIR HFA) 108 (90 Base) MCG/ACT inhaler Inhale 2 puffs into the lungs every 4 (four) hours as needed for wheezing or shortness of breath. 1 Inhaler 3  . amLODipine (NORVASC) 5 MG tablet Take 5 mg by mouth daily.    Marland Kitchen  amoxicillin (AMOXIL) 875 MG tablet Take 1 tablet by mouth 2 (two) times daily.    Marland Kitchen azelastine (ASTELIN) 0.1 % nasal spray USE 2 SPRAYS IN EACH  NOSTRIL TWO TIMES DAILY 60 mL 5  . CINNAMON PO 2 capsules Orally once a day    . cyclobenzaprine (FLEXERIL) 10 MG tablet 1 tablet Orally twice a day as needed for pain    . econazole nitrate 1 % cream 1 application to affected area Externally Once a day for 4 weeks    . famotidine-calcium carbonate-magnesium hydroxide (PEPCID COMPLETE) 10-800-165 MG chewable tablet 1 tablet Orally Twice a day    . fesoterodine (TOVIAZ) 8 MG TB24 tablet Take 8 mg by mouth at bedtime.     . fluticasone (FLOVENT HFA) 110 MCG/ACT inhaler Inhale 2 puffs into the lungs 2 (two) times daily. 1 Inhaler 5  . gabapentin (NEURONTIN) 300 MG capsule Take 600 mg by mouth 3 (three) times daily.     Marland Kitchen GLUCOSAMINE-CHONDROITIN PO Take 1 tablet by mouth daily. Move Free Ultra Triple Action    . glucose blood  test strip as directed finger stick once a day for 90 days    . hydrochlorothiazide (MICROZIDE) 12.5 MG capsule Take 12.5 mg by mouth daily.    . hydrocortisone-pramoxine (PROCTOFOAM HC) rectal foam 1 application as needed Rectal Four times a day if needed for hemorrhoid for 7 days    . levothyroxine (SYNTHROID, LEVOTHROID) 100 MCG tablet     . losartan (COZAAR) 100 MG tablet Take 100 mg by mouth daily.    . Melatonin 3 MG TABS Take 3 mg by mouth as needed.    . meloxicam (MOBIC) 7.5 MG tablet Take 7.5 mg by mouth See admin instructions. Take 1 tablet (7.5 mg) by mouth every morning, may take an additional tablet at bedtime as needed for pain    . metFORMIN (GLUCOPHAGE-XR) 500 MG 24 hr tablet 1 tablet with evening meal Orally Once a day for 90 days    . misoprostol (CYTOTEC) 200 MCG tablet Place 1 tablet (200 mcg) in the vagina the night prior to the procedure and then the morning of the procedure. 2 tablet 0  . montelukast (SINGULAIR) 10 MG tablet Take 10 mg by mouth at bedtime.    . Multiple Vitamin (MULTIVITAMIN WITH MINERALS) TABS tablet Take 1 tablet by mouth at bedtime.    . NON FORMULARY Hemp Extract Cream Externally three times a day as needed for back and hip pain    . Omega-3 Fatty Acids (FISH OIL) 1200 MG CAPS Take 1,200 mg by mouth daily.    . simvastatin (ZOCOR) 20 MG tablet Take 20 mg by mouth at bedtime.     . Turmeric 500 MG CAPS 2 capsules Orally once a day    . zolpidem (AMBIEN) 10 MG tablet Take 5 mg by mouth at bedtime as needed for sleep.      No current facility-administered medications for this visit.      ALLERGIES: Patient has no known allergies.  Family History  Problem Relation Age of Onset  . Allergic rhinitis Father   . Allergic rhinitis Sister   . Asthma Sister   . Uterine cancer Sister 70  . Asthma Paternal Aunt   . Multiple myeloma Sister   . Angioedema Neg Hx   . Eczema Neg Hx   . Immunodeficiency Neg Hx   . Urticaria Neg Hx     Social History    Socioeconomic History  . Marital  status: Married    Spouse name: Not on file  . Number of children: Not on file  . Years of education: Not on file  . Highest education level: Not on file  Occupational History  . Not on file  Social Needs  . Financial resource strain: Not on file  . Food insecurity:    Worry: Not on file    Inability: Not on file  . Transportation needs:    Medical: Not on file    Non-medical: Not on file  Tobacco Use  . Smoking status: Never Smoker  . Smokeless tobacco: Never Used  Substance and Sexual Activity  . Alcohol use: Yes    Comment: occ  . Drug use: No  . Sexual activity: Yes    Birth control/protection: Post-menopausal  Lifestyle  . Physical activity:    Days per week: Not on file    Minutes per session: Not on file  . Stress: Not on file  Relationships  . Social connections:    Talks on phone: Not on file    Gets together: Not on file    Attends religious service: Not on file    Active member of club or organization: Not on file    Attends meetings of clubs or organizations: Not on file    Relationship status: Not on file  . Intimate partner violence:    Fear of current or ex partner: Not on file    Emotionally abused: Not on file    Physically abused: Not on file    Forced sexual activity: Not on file  Other Topics Concern  . Not on file  Social History Narrative  . Not on file    Review of Systems  Constitutional: Negative.   HENT: Negative.   Eyes: Negative.   Respiratory: Negative.   Cardiovascular: Negative.   Gastrointestinal: Negative.   Endocrine: Negative.   Genitourinary: Negative.   Musculoskeletal: Negative.   Skin: Negative.   Allergic/Immunologic: Negative.   Neurological: Negative.   Hematological: Negative.   Psychiatric/Behavioral: Negative.     PHYSICAL EXAMINATION:    BP 138/80 (BP Location: Right Arm, Patient Position: Sitting, Cuff Size: Large)   Pulse 84   Resp 16   Ht '5\' 6"'  (1.676 m)   Wt  233 lb (105.7 kg)   BMI 37.61 kg/m     General appearance: alert, cooperative and appears stated age   Sonohysterogram and EMB.  Consent for procedures. Pelvic US showing difficulty to see endometrial stripe due to fibroids.   Speculum placed and atrophy noted with contact with speculum. Sterile prep with Hibiclens.  Paracervical block with 10 cc local 1% lidocaine - lot [id number], exp 4/23. Tenaculum to anterior cervical lip. Scalpel used to open external os. Cannula placed and sterile saline injected.  Calcified suspected submucous fibroid noted. Difficult to get good distention of the endometrial cavity due to the suspected fibroid.  Pipelle passed x 2 to 7 cm.  Small amount of endometrial tissue obtained.  Tissue to pathology. No complications.  Minimal EBL.  Chaperone was present for exam.  ASSESSMENT  Postmenopausal bleeding.  Uterine fibroids including submucous fibroid.  FH uterine cancer.  Atrophy.  Cervical stenosis.  Back DJD.  Facing potential surgery.   PLAN  FU EMB.  Post EMB instructions and precautions given. I discussed with the patient the potential to proceed with a hysteroscopy with dilation and curettage and removal of submucous fibroid/mass. Referral to GYN ONC if malignancy noted.     An  After Visit Summary was printed and given to the patient.  ___15___ minutes face to face time of which over 50% was spent in counseling.

## 2018-07-21 NOTE — Progress Notes (Signed)
Encounter reviewed by Dr. Yolinda Duerr Amundson C. Silva.  

## 2018-07-29 NOTE — Progress Notes (Signed)
GYNECOLOGY  VISIT   HPI: 68 y.o.   Married  Caucasian  female   [phone] with Patient's last menstrual period was 07/06/1998 (approximate).   here for surgical consult.     Presented with postmenopausal bleeding onset after intercourse.  She does have signs of atrophy.   EMB showed atrophic endometrium.  Pelvic US showed fibroids and difficulty assessing the endometrium. Ovaries were normal.  Calcified submucous area consistent with possible fibroid noted at time of sonohysterogram.  Did not have good distention of the endometrium.   She did have cervical stenosis and I was able to get inside the endocervical canal with opening of the cervix with a scalpel at the external os.  Personal hx of hysteroscopy myomectomy in 2010.  Sister dx with endometrial cancer.   GYNECOLOGIC HISTORY: Patient's last menstrual period was 07/06/1998 (approximate). Contraception:  Postmenopausal Menopausal hormone therapy:  none Last mammogram:  03/2018 at [facility], normal per patient Last pap smear:   07-04-18 Neg with Dr.D.Gates, 06-01-16 Neg:Neg HR HPV         OB History    Gravida  3   Para      Term      Preterm      AB  1   Living  2     SAB  1   TAB      Ectopic      Multiple      Live Births                 Patient Active Problem List   Diagnosis Date Noted  . Mild persistent asthma without complication 44/09/4740  . Mixed rhinitis 06/10/2018  . Obstructive sleep apnea 12/11/2016  . Palpitations 06/17/2016  . Deflected nasal septum 09/13/2015  . Hypertrophy of nasal turbinates 09/13/2015  . Deviated nasal septum 06/21/2015    Class: Chronic  . HYPERLIPIDEMIA 11/18/2007  . Essential hypertension 11/18/2007  . Allergic rhinitis 11/18/2007  . COUGH 11/18/2007    Past Medical History:  Diagnosis Date  . Asthma   . Chronic back pain   . Diabetes mellitus without complication ([place])    no med  . GERD (gastroesophageal reflux disease)   . Hyperlipemia   .  Hypertension   . Hypothyroidism   . Insomnia   . OAB (overactive bladder)   . Seasonal allergies   . Sleep apnea    uses a cpap    Past Surgical History:  Procedure Laterality Date  . ANKLE ARTHROTOMY  2003   left-fx  . APPENDECTOMY    . BACK SURGERY  2010   lumb disk  . CARPAL TUNNEL RELEASE     rt  . CARPAL TUNNEL RELEASE Left 08/01/2013   Procedure: LEFT CARPAL TUNNEL RELEASE;  Surgeon: Wynonia Sours, MD;  Location: [place];  Service: Orthopedics;  Laterality: Left;  . CESAREAN SECTION  83,86  . COLONOSCOPY    . DILATION AND CURETTAGE OF UTERUS     x2  . MASS EXCISION N/A 08/04/2017   Procedure: EXCISION LIPOMA UPPER BACK x2 AND NECK;  Surgeon: Erroll Luna, MD;  Location: [place];  Service: General;  Laterality: N/A;  . NASAL SEPTOPLASTY W/ TURBINOPLASTY Bilateral 06/21/2015   Procedure: NASAL SEPTOPLASTY WITH BILATERAL TURBINATE REDUCTION;  Surgeon: Jerrell Belfast, MD;  Location: [place];  Service: ENT;  Laterality: Bilateral;  . SINOSCOPY    . TONSILLECTOMY    . TUBAL LIGATION      Current Outpatient Medications  Medication Sig Dispense Refill  . 5-Hydroxytryptophan (5-HTP MAXIMUM STRENGTH) 200 MG CAPS Take 200 mg by mouth daily.    Marland Kitchen albuterol (PROAIR HFA) 108 (90 Base) MCG/ACT inhaler Inhale 2 puffs into the lungs every 4 (four) hours as needed for wheezing or shortness of breath. 1 Inhaler 3  . amLODipine (NORVASC) 5 MG tablet Take 5 mg by mouth daily.    Marland Kitchen azelastine (ASTELIN) 0.1 % nasal spray USE 2 SPRAYS IN EACH  NOSTRIL TWO TIMES DAILY 60 mL 5  . CINNAMON PO 2 capsules Orally once a day    . cyclobenzaprine (FLEXERIL) 10 MG tablet 1 tablet Orally twice a day as needed for pain    . econazole nitrate 1 % cream 1 application to affected area Externally Once a day for 4 weeks    . famotidine-calcium carbonate-magnesium hydroxide (PEPCID COMPLETE) 10-800-165 MG chewable tablet 1 tablet Orally Twice a day    . fesoterodine  (TOVIAZ) 8 MG TB24 tablet Take 8 mg by mouth at bedtime.     . fluticasone (FLOVENT HFA) 110 MCG/ACT inhaler Inhale 2 puffs into the lungs 2 (two) times daily. 1 Inhaler 5  . gabapentin (NEURONTIN) 300 MG capsule Take 600 mg by mouth 3 (three) times daily.     Marland Kitchen GLUCOSAMINE-CHONDROITIN PO Take 1 tablet by mouth daily. Move Free Ultra Triple Action    . glucose blood test strip as directed finger stick once a day for 90 days    . hydrochlorothiazide (MICROZIDE) 12.5 MG capsule Take 12.5 mg by mouth daily.    . hydrocortisone-pramoxine (PROCTOFOAM HC) rectal foam 1 application as needed Rectal Four times a day if needed for hemorrhoid for 7 days    . levothyroxine (SYNTHROID, LEVOTHROID) 100 MCG tablet     . losartan (COZAAR) 100 MG tablet Take 100 mg by mouth daily.    . Melatonin 3 MG TABS Take 3 mg by mouth as needed.    . meloxicam (MOBIC) 7.5 MG tablet Take 7.5 mg by mouth See admin instructions. Take 1 tablet (7.5 mg) by mouth every morning, may take an additional tablet at bedtime as needed for pain    . metFORMIN (GLUCOPHAGE-XR) 500 MG 24 hr tablet 1 tablet with evening meal Orally Once a day for 90 days    . misoprostol (CYTOTEC) 200 MCG tablet Place 1 tablet (200 mcg) in the vagina the night prior to the procedure and then the morning of the procedure. 2 tablet 0  . montelukast (SINGULAIR) 10 MG tablet Take 10 mg by mouth at bedtime.    . Multiple Vitamin (MULTIVITAMIN WITH MINERALS) TABS tablet Take 1 tablet by mouth at bedtime.    . NON FORMULARY Hemp Extract Cream Externally three times a day as needed for back and hip pain    . Omega-3 Fatty Acids (FISH OIL) 1200 MG CAPS Take 1,200 mg by mouth daily.    . simvastatin (ZOCOR) 20 MG tablet Take 20 mg by mouth at bedtime.     . Turmeric 500 MG CAPS 2 capsules Orally once a day    . zolpidem (AMBIEN) 10 MG tablet Take 5 mg by mouth at bedtime as needed for sleep.      No current facility-administered medications for this visit.       ALLERGIES: Patient has no known allergies.  Family History  Problem Relation Age of Onset  . Allergic rhinitis Father   . Allergic rhinitis Sister   . Asthma Sister   . Uterine  cancer Sister 40  . Asthma Paternal Aunt   . Multiple myeloma Sister   . Angioedema Neg Hx   . Eczema Neg Hx   . Immunodeficiency Neg Hx   . Urticaria Neg Hx     Social History   Socioeconomic History  . Marital status: Married    Spouse name: Not on file  . Number of children: Not on file  . Years of education: Not on file  . Highest education level: Not on file  Occupational History  . Not on file  Social Needs  . Financial resource strain: Not on file  . Food insecurity:    Worry: Not on file    Inability: Not on file  . Transportation needs:    Medical: Not on file    Non-medical: Not on file  Tobacco Use  . Smoking status: Never Smoker  . Smokeless tobacco: Never Used  Substance and Sexual Activity  . Alcohol use: Yes    Comment: occ  . Drug use: No  . Sexual activity: Yes    Birth control/protection: Post-menopausal  Lifestyle  . Physical activity:    Days per week: Not on file    Minutes per session: Not on file  . Stress: Not on file  Relationships  . Social connections:    Talks on phone: Not on file    Gets together: Not on file    Attends religious service: Not on file    Active member of club or organization: Not on file    Attends meetings of clubs or organizations: Not on file    Relationship status: Not on file  . Intimate partner violence:    Fear of current or ex partner: Not on file    Emotionally abused: Not on file    Physically abused: Not on file    Forced sexual activity: Not on file  Other Topics Concern  . Not on file  Social History Narrative  . Not on file    Review of Systems  All other systems reviewed and are negative.   PHYSICAL EXAMINATION:    BP 130/68 (BP Location: Right Arm, Patient Position: Sitting, Cuff Size: Large)   Pulse 88    Ht '5\' 6"'  (1.676 m)   Wt 231 lb 12.8 oz (105.1 kg)   LMP 07/06/1998 (Approximate)   BMI 37.41 kg/m     General appearance: alert, cooperative and appears stated age Head: Normocephalic, without obvious abnormality, atraumatic Neck: no adenopathy, supple, symmetrical, trachea midline and thyroid normal to inspection and palpation Lungs: clear to auscultation bilaterally Heart: regular rate and rhythm Abdomen: soft, non-tender, no masses,  no organomegaly Extremities: extremities normal, atraumatic, no cyanosis or edema Skin: Skin color, texture, turgor normal. No rashes or lesions Lymph nodes: Cervical, supraclavicular, and axillary nodes normal. No abnormal inguinal nodes palpated Neurologic: Grossly normal  Pelvic: External genitalia:  no lesions              Urethra:  normal appearing urethra with no masses, tenderness or lesions              Bartholins and Skenes: normal                 Vagina: normal appearing vagina with normal color and discharge, no lesions.. Atrophy noted on exam.               Cervix: no lesions                Bimanual  Exam:  Uterus:  normal size, contour, position, consistency, mobility, non-tender              Adnexa: no mass, fullness, tenderness          Chaperone was present for exam.  ASSESSMENT  Postmenopausal bleeding.  Fibroids including probable calcified submucous fibroid.  Cervical stenosis.  Able to get inside endometrial canal with in office procedure. Atrophy. FH endometrial cancer. DM. On Metformin.   PLAN  Discussion of postmenopausal bleeding and calcified submucous fibroid. Discussion of hysteroscopy with Myosure removal of endometrial mass, dilation and curettage.  Risks, benefits, and alternatives reviewed. Risks include but are not limited to bleeding, infection, damage to surrounding organs including uterine perforation requiring hospitalization and laparoscopy, pulmonary edema, reaction to anesthesia, DVT, PE, death, need  for further treatment. Surgical expectations and recovery discussed.  ACOG HO on hysteroscopy and dilation and curettage. Patient wishes to proceed.  An After Visit Summary was printed and given to the patient.  _25_____ minutes face to face time of which over 50% was spent in counseling.

## 2018-08-01 ENCOUNTER — Ambulatory Visit (INDEPENDENT_AMBULATORY_CARE_PROVIDER_SITE_OTHER): Payer: Medicare HMO | Admitting: Obstetrics and Gynecology

## 2018-08-01 ENCOUNTER — Other Ambulatory Visit: Payer: Self-pay

## 2018-08-01 ENCOUNTER — Encounter: Payer: Self-pay | Admitting: Obstetrics and Gynecology

## 2018-08-01 VITALS — BP 130/68 | HR 88 | Ht 66.0 in | Wt 231.8 lb

## 2018-08-01 DIAGNOSIS — N9489 Other specified conditions associated with female genital organs and menstrual cycle: Secondary | ICD-10-CM | POA: Diagnosis not present

## 2018-08-01 DIAGNOSIS — N950 Postmenopausal bleeding: Secondary | ICD-10-CM | POA: Diagnosis not present

## 2018-08-08 DIAGNOSIS — G4733 Obstructive sleep apnea (adult) (pediatric): Secondary | ICD-10-CM | POA: Diagnosis not present

## 2018-08-14 NOTE — H&P (Signed)
Office Visit   08/01/2018 [name], [name], MD  Obstetrics and Gynecology   Postmenopausal bleeding +1 more  Dx   Surgery Consult    Reason for Visit   Additional Documentation   Vitals:   BP 130/68 (BP Location: Right Arm, Patient Position: Sitting, Cuff Size: Large)   Pulse 88   Ht '5\' 6"'  (1.676 m)   Wt 105.1 kg   LMP 07/06/1998 (Approximate)   BMI 37.41 kg/m   BSA 2.21 m     More Vitals   Flowsheets:   MEWS Score,   Anthropometrics     Encounter Info:   Billing Info,   History,   Allergies,   Detailed Report     All Notes   Progress Notes by Nunzio Cobbs, MD at 08/01/2018 11:00 AM  Author: Nunzio Cobbs, MD Author Type: Physician Filed: 08/01/2018 12:10 PM  Note Status: Signed Cosign: Cosign Not Required Encounter Date: 08/01/2018  Editor: Nunzio Cobbs, MD (Physician)  Prior Versions: 1. Lowella Fairy, CMA (Certified Psychologist, sport and exercise) at 08/01/2018 11:27 AM - Sign when Signing Visit    GYNECOLOGY  VISIT   HPI: 68 y.o.   Married  Caucasian  female   [phone] with Patient's last menstrual period was 07/06/1998 (approximate).   here for surgical consult.     Presented with postmenopausal bleeding onset after intercourse.  She does have signs of atrophy.   EMB showed atrophic endometrium.  Pelvic US showed fibroids and difficulty assessing the endometrium. Ovaries were normal.  Calcified submucous area consistent with possible fibroid noted at time of sonohysterogram.  Did not have good distention of the endometrium.   She did have cervical stenosis and I was able to get inside the endocervical canal with opening of the cervix with a scalpel at the external os.  Personal hx of hysteroscopy myomectomy in 2010.  Sister dx with endometrial cancer.   GYNECOLOGIC HISTORY: Patient's last menstrual period was 07/06/1998 (approximate). Contraception:  Postmenopausal Menopausal  hormone therapy:  none Last mammogram: 03/2018 at [facility], normal per patient Last pap smear: 07-04-18 Neg with Dr.D.Gates, 06-01-16 Neg:Neg HR HPV                 OB History    Gravida  3   Para      Term      Preterm      AB  1   Living  2     SAB  1   TAB      Ectopic      Multiple      Live Births                      Patient Active Problem List   Diagnosis Date Noted  . Mild persistent asthma without complication 19/41/7408  . Mixed rhinitis 06/10/2018  . Obstructive sleep apnea 12/11/2016  . Palpitations 06/17/2016  . Deflected nasal septum 09/13/2015  . Hypertrophy of nasal turbinates 09/13/2015  . Deviated nasal septum 06/21/2015    Class: Chronic  . HYPERLIPIDEMIA 11/18/2007  . Essential hypertension 11/18/2007  . Allergic rhinitis 11/18/2007  . COUGH 11/18/2007        Past Medical History:  Diagnosis Date  . Asthma   . Chronic back pain   . Diabetes mellitus without complication ([place])    no med  . GERD (gastroesophageal reflux disease)   .  Hyperlipemia   . Hypertension   . Hypothyroidism   . Insomnia   . OAB (overactive bladder)   . Seasonal allergies   . Sleep apnea    uses a cpap         Past Surgical History:  Procedure Laterality Date  . ANKLE ARTHROTOMY  2003   left-fx  . APPENDECTOMY    . BACK SURGERY  2010   lumb disk  . CARPAL TUNNEL RELEASE     rt  . CARPAL TUNNEL RELEASE Left 08/01/2013   Procedure: LEFT CARPAL TUNNEL RELEASE;  Surgeon: Wynonia Sours, MD;  Location: [place];  Service: Orthopedics;  Laterality: Left;  . CESAREAN SECTION  83,86  . COLONOSCOPY    . DILATION AND CURETTAGE OF UTERUS     x2  . MASS EXCISION N/A 08/04/2017   Procedure: EXCISION LIPOMA UPPER BACK x2 AND NECK;  Surgeon: Erroll Luna, MD;  Location: [place];  Service: General;  Laterality: N/A;  . NASAL SEPTOPLASTY W/ TURBINOPLASTY Bilateral 06/21/2015    Procedure: NASAL SEPTOPLASTY WITH BILATERAL TURBINATE REDUCTION;  Surgeon: Jerrell Belfast, MD;  Location: [place];  Service: ENT;  Laterality: Bilateral;  . SINOSCOPY    . TONSILLECTOMY    . TUBAL LIGATION            Current Outpatient Medications  Medication Sig Dispense Refill  . 5-Hydroxytryptophan (5-HTP MAXIMUM STRENGTH) 200 MG CAPS Take 200 mg by mouth daily.    Marland Kitchen albuterol (PROAIR HFA) 108 (90 Base) MCG/ACT inhaler Inhale 2 puffs into the lungs every 4 (four) hours as needed for wheezing or shortness of breath. 1 Inhaler 3  . amLODipine (NORVASC) 5 MG tablet Take 5 mg by mouth daily.    Marland Kitchen azelastine (ASTELIN) 0.1 % nasal spray USE 2 SPRAYS IN EACH  NOSTRIL TWO TIMES DAILY 60 mL 5  . CINNAMON PO 2 capsules Orally once a day    . cyclobenzaprine (FLEXERIL) 10 MG tablet 1 tablet Orally twice a day as needed for pain    . econazole nitrate 1 % cream 1 application to affected area Externally Once a day for 4 weeks    . famotidine-calcium carbonate-magnesium hydroxide (PEPCID COMPLETE) 10-800-165 MG chewable tablet 1 tablet Orally Twice a day    . fesoterodine (TOVIAZ) 8 MG TB24 tablet Take 8 mg by mouth at bedtime.     . fluticasone (FLOVENT HFA) 110 MCG/ACT inhaler Inhale 2 puffs into the lungs 2 (two) times daily. 1 Inhaler 5  . gabapentin (NEURONTIN) 300 MG capsule Take 600 mg by mouth 3 (three) times daily.     Marland Kitchen GLUCOSAMINE-CHONDROITIN PO Take 1 tablet by mouth daily. Move Free Ultra Triple Action    . glucose blood test strip as directed finger stick once a day for 90 days    . hydrochlorothiazide (MICROZIDE) 12.5 MG capsule Take 12.5 mg by mouth daily.    . hydrocortisone-pramoxine (PROCTOFOAM HC) rectal foam 1 application as needed Rectal Four times a day if needed for hemorrhoid for 7 days    . levothyroxine (SYNTHROID, LEVOTHROID) 100 MCG tablet     . losartan (COZAAR) 100 MG tablet Take 100 mg by mouth daily.    . Melatonin 3 MG TABS Take 3  mg by mouth as needed.    . meloxicam (MOBIC) 7.5 MG tablet Take 7.5 mg by mouth See admin instructions. Take 1 tablet (7.5 mg) by mouth every morning, may take an additional tablet at bedtime as  needed for pain    . metFORMIN (GLUCOPHAGE-XR) 500 MG 24 hr tablet 1 tablet with evening meal Orally Once a day for 90 days    . misoprostol (CYTOTEC) 200 MCG tablet Place 1 tablet (200 mcg) in the vagina the night prior to the procedure and then the morning of the procedure. 2 tablet 0  . montelukast (SINGULAIR) 10 MG tablet Take 10 mg by mouth at bedtime.    . Multiple Vitamin (MULTIVITAMIN WITH MINERALS) TABS tablet Take 1 tablet by mouth at bedtime.    . NON FORMULARY Hemp Extract Cream Externally three times a day as needed for back and hip pain    . Omega-3 Fatty Acids (FISH OIL) 1200 MG CAPS Take 1,200 mg by mouth daily.    . simvastatin (ZOCOR) 20 MG tablet Take 20 mg by mouth at bedtime.     . Turmeric 500 MG CAPS 2 capsules Orally once a day    . zolpidem (AMBIEN) 10 MG tablet Take 5 mg by mouth at bedtime as needed for sleep.      No current facility-administered medications for this visit.      ALLERGIES: Patient has no known allergies.       Family History  Problem Relation Age of Onset  . Allergic rhinitis Father   . Allergic rhinitis Sister   . Asthma Sister   . Uterine cancer Sister 72  . Asthma Paternal Aunt   . Multiple myeloma Sister   . Angioedema Neg Hx   . Eczema Neg Hx   . Immunodeficiency Neg Hx   . Urticaria Neg Hx     Social History        Socioeconomic History  . Marital status: Married    Spouse name: Not on file  . Number of children: Not on file  . Years of education: Not on file  . Highest education level: Not on file  Occupational History  . Not on file  Social Needs  . Financial resource strain: Not on file  . Food insecurity:    Worry: Not on file    Inability: Not on file  . Transportation needs:     Medical: Not on file    Non-medical: Not on file  Tobacco Use  . Smoking status: Never Smoker  . Smokeless tobacco: Never Used  Substance and Sexual Activity  . Alcohol use: Yes    Comment: occ  . Drug use: No  . Sexual activity: Yes    Birth control/protection: Post-menopausal  Lifestyle  . Physical activity:    Days per week: Not on file    Minutes per session: Not on file  . Stress: Not on file  Relationships  . Social connections:    Talks on phone: Not on file    Gets together: Not on file    Attends religious service: Not on file    Active member of club or organization: Not on file    Attends meetings of clubs or organizations: Not on file    Relationship status: Not on file  . Intimate partner violence:    Fear of current or ex partner: Not on file    Emotionally abused: Not on file    Physically abused: Not on file    Forced sexual activity: Not on file  Other Topics Concern  . Not on file  Social History Narrative  . Not on file    Review of Systems  All other systems reviewed and are negative.  PHYSICAL EXAMINATION:    BP 130/68 (BP Location: Right Arm, Patient Position: Sitting, Cuff Size: Large)   Pulse 88   Ht '5\' 6"'  (1.676 m)   Wt 231 lb 12.8 oz (105.1 kg)   LMP 07/06/1998 (Approximate)   BMI 37.41 kg/m     General appearance: alert, cooperative and appears stated age Head: Normocephalic, without obvious abnormality, atraumatic Neck: no adenopathy, supple, symmetrical, trachea midline and thyroid normal to inspection and palpation Lungs: clear to auscultation bilaterally Heart: regular rate and rhythm Abdomen: soft, non-tender, no masses,  no organomegaly Extremities: extremities normal, atraumatic, no cyanosis or edema Skin: Skin color, texture, turgor normal. No rashes or lesions Lymph nodes: Cervical, supraclavicular, and axillary nodes normal. No abnormal inguinal nodes palpated Neurologic: Grossly  normal  Pelvic: External genitalia:  no lesions              Urethra:  normal appearing urethra with no masses, tenderness or lesions              Bartholins and Skenes: normal                 Vagina: normal appearing vagina with normal color and discharge, no lesions.. Atrophy noted on exam.               Cervix: no lesions                Bimanual Exam:  Uterus:  normal size, contour, position, consistency, mobility, non-tender              Adnexa: no mass, fullness, tenderness          Chaperone was present for exam.  ASSESSMENT  Postmenopausal bleeding.  Fibroids including probable calcified submucous fibroid.  Cervical stenosis.  Able to get inside endometrial canal with in office procedure. Atrophy. FH endometrial cancer. DM. On Metformin.   PLAN  Discussion of postmenopausal bleeding and calcified submucous fibroid. Discussion of hysteroscopy with Myosure removal of endometrial mass, dilation and curettage.  Risks, benefits, and alternatives reviewed. Risks include but are not limited to bleeding, infection, damage to surrounding organs including uterine perforation requiring hospitalization and laparoscopy, pulmonary edema, reaction to anesthesia, DVT, PE, death, need for further treatment. Surgical expectations and recovery discussed.  ACOG HO on hysteroscopy and dilation and curettage. Patient wishes to proceed.  An After Visit Summary was printed and given to the patient.  _25_____ minutes face to face time of which over 50% was spent in counseling.

## 2018-08-17 DIAGNOSIS — G4733 Obstructive sleep apnea (adult) (pediatric): Secondary | ICD-10-CM | POA: Diagnosis not present

## 2018-08-18 ENCOUNTER — Encounter (HOSPITAL_BASED_OUTPATIENT_CLINIC_OR_DEPARTMENT_OTHER): Payer: Self-pay | Admitting: *Deleted

## 2018-08-18 ENCOUNTER — Other Ambulatory Visit: Payer: Self-pay

## 2018-08-18 NOTE — Progress Notes (Signed)
Spoke with patient via telephone for pre op interview. Pre op anesthesia consult scheduled per MD request for 08/19/2018 At 1000. CBC, BMET, and EKG will be preformed at that time. Patient to take Norvasc, synthroid, Pepcid, Cozaar AM of surgery with a sip of water. Patient to bring Albuterol inhaler with her AM of surgery. Arrival time 49.

## 2018-08-19 ENCOUNTER — Encounter (HOSPITAL_COMMUNITY)
Admission: RE | Admit: 2018-08-19 | Discharge: 2018-08-19 | Disposition: A | Discharge status: Home / Self Care | Payer: Medicare HMO | Source: Ambulatory Visit | Attending: Obstetrics and Gynecology | Admitting: Obstetrics and Gynecology

## 2018-08-19 DIAGNOSIS — Z01818 Encounter for other preprocedural examination: Secondary | ICD-10-CM | POA: Insufficient documentation

## 2018-08-19 LAB — BASIC METABOLIC PANEL
Anion gap: 8 (ref 5–15)
BUN: 20 mg/dL (ref 8–23)
CO2: 27 mmol/L (ref 22–32)
Calcium: 9.3 mg/dL (ref 8.9–10.3)
Chloride: 105 mmol/L (ref 98–111)
Creatinine, Ser: 0.72 mg/dL (ref 0.44–1.00)
GFR calc Af Amer: >60 mL/min (ref >60)
Glucose, Bld: 109 mg/dL — ABNORMAL HIGH (ref 70–99)
POTASSIUM: 4 mmol/L (ref 3.5–5.1)
Sodium: 140 mmol/L (ref 135–145)

## 2018-08-19 LAB — CBC
HCT: 43.9 % (ref 36.0–46.0)
HEMOGLOBIN: 13.8 g/dL (ref 12.0–15.0)
MCH: 29.1 pg (ref 26.0–34.0)
MCHC: 31.4 g/dL (ref 30.0–36.0)
MCV: 92.4 fL (ref 80.0–100.0)
Platelets: 259 10*3/uL (ref 150–400)
RBC: 4.75 MIL/uL (ref 3.87–5.11)
RDW: 13.2 % (ref 11.5–15.5)
WBC: 7 10*3/uL (ref 4.0–10.5)
nRBC: 0 % (ref 0.0–0.2)

## 2018-08-19 NOTE — Progress Notes (Signed)
Anesthesia Consult   Case:  [id number] Date/Time:  08/23/18 1015   Procedure:  DILATATION & CURETTAGE/HYSTEROSCOPY WITH MYOSURE (N/A ) - fibroid resection   Anesthesia type:  Choice   Pre-op diagnosis:  PMB, submocosal fibroid, cervical stenosis, family history uterine cancer   Location:  [place] OR ROOM 1 / [facility]   Surgeon:  Nunzio Cobbs, MD      DISCUSSION: 68 yo never smoker with h/o HTN, HLD, DM II, asthma, GERD, OSA w/CPAP, hypothyroidism, PMB, submucosal fibroid, cervical stenosis scheduled for above procedure 08/23/18 with Dr. Josefa Half.   Anesthesia Consult requested.  Pt with normal exam, heart RRR, lungs clear to auscultation, no LE edama, normal cervical ROM.  She reports h/o palpitations in the past, was seen by Dr. Quay Burow at the time, LOV 12/11/16.  She reports complete resolution of palpitations with lifestyle modifications, specifically reducing caffeine intake.   Pt can proceed with planned procedure barring acute status change.  VS: LMP 07/06/1998 (Approximate)   PROVIDERS: Darcus Austin, MD (Inactive) is PCP   Quay Burow, MD is Cardiologist  LABS: Labs reviewed: Acceptable for surgery. (all labs ordered are listed, but only abnormal results are displayed)  Labs Reviewed  BASIC METABOLIC PANEL - Abnormal; Notable for the following components:      Result Value   Glucose, Bld 109 (*)    All other components within normal limits  CBC     IMAGES:   EKG: 08/19/2018 Rate 80 bpm Normal sinus rhythm Normal ECG  CV:  Past Medical History:  Diagnosis Date  . Asthma   . Chronic back pain   . Diabetes mellitus without complication ([place])    no med  . GERD (gastroesophageal reflux disease)   . Hyperlipemia   . Hypertension   . Hypothyroidism   . Insomnia   . OAB (overactive bladder)   . Seasonal allergies   . Sleep apnea    uses a cpap    Past Surgical History:  Procedure Laterality Date  . ANKLE ARTHROTOMY   2003   left-fx  . APPENDECTOMY    . BACK SURGERY  2010   lumb disk  . CARPAL TUNNEL RELEASE     rt  . CARPAL TUNNEL RELEASE Left 08/01/2013   Procedure: LEFT CARPAL TUNNEL RELEASE;  Surgeon: Wynonia Sours, MD;  Location: [place];  Service: Orthopedics;  Laterality: Left;  . CESAREAN SECTION  83,86  . COLONOSCOPY    . DILATION AND CURETTAGE OF UTERUS     x2  . MASS EXCISION N/A 08/04/2017   Procedure: EXCISION LIPOMA UPPER BACK x2 AND NECK;  Surgeon: Erroll Luna, MD;  Location: [place];  Service: General;  Laterality: N/A;  . NASAL SEPTOPLASTY W/ TURBINOPLASTY Bilateral 06/21/2015   Procedure: NASAL SEPTOPLASTY WITH BILATERAL TURBINATE REDUCTION;  Surgeon: Jerrell Belfast, MD;  Location: [place];  Service: ENT;  Laterality: Bilateral;  . SINOSCOPY    . TONSILLECTOMY    . TUBAL LIGATION      MEDICATIONS: . 5-Hydroxytryptophan (5-HTP MAXIMUM STRENGTH) 200 MG CAPS  . albuterol (PROAIR HFA) 108 (90 Base) MCG/ACT inhaler  . amLODipine (NORVASC) 5 MG tablet  . azelastine (ASTELIN) 0.1 % nasal spray  . CINNAMON PO  . cyclobenzaprine (FLEXERIL) 10 MG tablet  . econazole nitrate 1 % cream  . famotidine-calcium carbonate-magnesium hydroxide (PEPCID COMPLETE) 10-800-165 MG chewable tablet  . fesoterodine (TOVIAZ) 8 MG TB24 tablet  . fluticasone (FLOVENT HFA) 110  MCG/ACT inhaler  . gabapentin (NEURONTIN) 300 MG capsule  . GLUCOSAMINE-CHONDROITIN PO  . glucose blood test strip  . hydrochlorothiazide (MICROZIDE) 12.5 MG capsule  . hydrocortisone-pramoxine (PROCTOFOAM HC) rectal foam  . levothyroxine (SYNTHROID, LEVOTHROID) 100 MCG tablet  . losartan (COZAAR) 100 MG tablet  . Melatonin 3 MG TABS  . meloxicam (MOBIC) 7.5 MG tablet  . metFORMIN (GLUCOPHAGE-XR) 500 MG 24 hr tablet  . misoprostol (CYTOTEC) 200 MCG tablet  . montelukast (SINGULAIR) 10 MG tablet  . Multiple Vitamin (MULTIVITAMIN WITH MINERALS) TABS tablet  . NON FORMULARY  . Omega-3 Fatty  Acids (FISH OIL) 1200 MG CAPS  . simvastatin (ZOCOR) 20 MG tablet  . Turmeric 500 MG CAPS  . zolpidem (AMBIEN) 10 MG tablet   No current facility-administered medications for this encounter.      Maia Plan [facility] Pre-Surgical Testing [phone] 08/19/18 10:27 AM

## 2018-08-19 NOTE — Progress Notes (Signed)
Spoke with patient face to face eras protocol , no solid food after midnight, clear liquids from midnight until 715 am, then npo

## 2018-08-19 NOTE — Anesthesia Preprocedure Evaluation (Addendum)
Anesthesia Evaluation  Patient identified by MRN, date of birth, ID band Patient awake    Reviewed: Allergy & Precautions, H&P , NPO status , Patient's Chart, lab work & pertinent test results  Airway Mallampati: II  TM Distance: <3 FB Neck ROM: full    Dental  (+) Teeth Intact, Dental Advisory Given   Pulmonary asthma , sleep apnea ,    breath sounds clear to auscultation       Cardiovascular hypertension,  Rhythm:regular Rate:Normal     Neuro/Psych    GI/Hepatic GERD  ,  Endo/Other  diabetes, Type 2Hypothyroidism obese  Renal/GU      Musculoskeletal   Abdominal   Peds  Hematology   Anesthesia Other Findings   Reproductive/Obstetrics                           Anesthesia Physical Anesthesia Plan  ASA: II  Anesthesia Plan: General   Post-op Pain Management:    Induction: Intravenous  PONV Risk Score and Plan: 3 and Ondansetron, Dexamethasone, Midazolam and Treatment may vary due to age or medical condition  Airway Management Planned: LMA  Additional Equipment:   Intra-op Plan:   Post-operative Plan: Extubation in OR  Informed Consent: I have reviewed the patients History and Physical, chart, labs and discussed the procedure including the risks, benefits and alternatives for the proposed anesthesia with the patient or authorized representative who has indicated his/her understanding and acceptance.       Plan Discussed with: CRNA, Anesthesiologist and Surgeon  Anesthesia Plan Comments: (See PST note 08/19/2018, Konrad Felix, PA-C)       Anesthesia Quick Evaluation

## 2018-08-23 ENCOUNTER — Encounter (HOSPITAL_BASED_OUTPATIENT_CLINIC_OR_DEPARTMENT_OTHER): Payer: Self-pay | Admitting: Anesthesiology

## 2018-08-23 ENCOUNTER — Ambulatory Visit (HOSPITAL_BASED_OUTPATIENT_CLINIC_OR_DEPARTMENT_OTHER)
Admission: RE | Admit: 2018-08-23 | Discharge: 2018-08-23 | Disposition: A | Discharge status: Home / Self Care | Payer: Medicare HMO | Source: Other Acute Inpatient Hospital | Attending: Obstetrics and Gynecology | Admitting: Obstetrics and Gynecology

## 2018-08-23 ENCOUNTER — Ambulatory Visit (HOSPITAL_BASED_OUTPATIENT_CLINIC_OR_DEPARTMENT_OTHER): Payer: Medicare HMO | Admitting: Physician Assistant

## 2018-08-23 ENCOUNTER — Other Ambulatory Visit: Payer: Self-pay

## 2018-08-23 ENCOUNTER — Encounter (HOSPITAL_BASED_OUTPATIENT_CLINIC_OR_DEPARTMENT_OTHER)
Admission: RE | Disposition: A | Discharge status: Home / Self Care | Payer: Self-pay | Source: Other Acute Inpatient Hospital | Attending: Obstetrics and Gynecology

## 2018-08-23 ENCOUNTER — Ambulatory Visit (HOSPITAL_BASED_OUTPATIENT_CLINIC_OR_DEPARTMENT_OTHER): Payer: Medicare HMO | Admitting: Anesthesiology

## 2018-08-23 DIAGNOSIS — N950 Postmenopausal bleeding: Secondary | ICD-10-CM | POA: Diagnosis not present

## 2018-08-23 DIAGNOSIS — Z7984 Long term (current) use of oral hypoglycemic drugs: Secondary | ICD-10-CM | POA: Insufficient documentation

## 2018-08-23 DIAGNOSIS — N882 Stricture and stenosis of cervix uteri: Secondary | ICD-10-CM | POA: Diagnosis not present

## 2018-08-23 DIAGNOSIS — Z79890 Hormone replacement therapy (postmenopausal): Secondary | ICD-10-CM | POA: Insufficient documentation

## 2018-08-23 DIAGNOSIS — D259 Leiomyoma of uterus, unspecified: Secondary | ICD-10-CM | POA: Insufficient documentation

## 2018-08-23 DIAGNOSIS — I10 Essential (primary) hypertension: Secondary | ICD-10-CM | POA: Insufficient documentation

## 2018-08-23 DIAGNOSIS — G4733 Obstructive sleep apnea (adult) (pediatric): Secondary | ICD-10-CM | POA: Insufficient documentation

## 2018-08-23 DIAGNOSIS — Z791 Long term (current) use of non-steroidal anti-inflammatories (NSAID): Secondary | ICD-10-CM | POA: Diagnosis not present

## 2018-08-23 DIAGNOSIS — E785 Hyperlipidemia, unspecified: Secondary | ICD-10-CM | POA: Insufficient documentation

## 2018-08-23 DIAGNOSIS — E039 Hypothyroidism, unspecified: Secondary | ICD-10-CM | POA: Insufficient documentation

## 2018-08-23 DIAGNOSIS — J4530 Mild persistent asthma, uncomplicated: Secondary | ICD-10-CM | POA: Insufficient documentation

## 2018-08-23 DIAGNOSIS — E119 Type 2 diabetes mellitus without complications: Secondary | ICD-10-CM | POA: Insufficient documentation

## 2018-08-23 DIAGNOSIS — Z79899 Other long term (current) drug therapy: Secondary | ICD-10-CM | POA: Diagnosis not present

## 2018-08-23 DIAGNOSIS — K219 Gastro-esophageal reflux disease without esophagitis: Secondary | ICD-10-CM | POA: Insufficient documentation

## 2018-08-23 DIAGNOSIS — D250 Submucous leiomyoma of uterus: Secondary | ICD-10-CM | POA: Diagnosis not present

## 2018-08-23 DIAGNOSIS — Z8049 Family history of malignant neoplasm of other genital organs: Secondary | ICD-10-CM | POA: Diagnosis not present

## 2018-08-23 DIAGNOSIS — N840 Polyp of corpus uteri: Secondary | ICD-10-CM | POA: Insufficient documentation

## 2018-08-23 HISTORY — PX: DILATATION & CURETTAGE/HYSTEROSCOPY WITH MYOSURE: SHX6511

## 2018-08-23 LAB — GLUCOSE, CAPILLARY
Glucose-Capillary: 180 mg/dL — ABNORMAL HIGH (ref 70–99)
Glucose-Capillary: 85 mg/dL (ref 70–99)
Glucose-Capillary: 107 mg/dL — ABNORMAL HIGH (ref 70–99)

## 2018-08-23 SURGERY — DILATATION & CURETTAGE/HYSTEROSCOPY WITH MYOSURE
Anesthesia: General | Site: Vagina

## 2018-08-23 MED ORDER — ONDANSETRON HCL 4 MG/2ML IJ SOLN
INTRAMUSCULAR | Status: DC | PRN
  Administered 2018-08-23: 4 mg via INTRAVENOUS

## 2018-08-23 MED ORDER — MIDAZOLAM HCL 2 MG/2ML IJ SOLN
INTRAMUSCULAR | Status: DC | PRN
  Administered 2018-08-23: 2 mg via INTRAVENOUS

## 2018-08-23 MED ORDER — MIDAZOLAM HCL 2 MG/2ML IJ SOLN
INTRAMUSCULAR | Status: AC
  Filled 2018-08-23: qty 2

## 2018-08-23 MED ORDER — OXYCODONE HCL 5 MG/5ML PO SOLN
5.0000 mg | Freq: Once | ORAL | Status: DC | PRN
  Filled 2018-08-23: qty 5

## 2018-08-23 MED ORDER — PROPOFOL 10 MG/ML IV BOLUS
INTRAVENOUS | Status: AC
  Filled 2018-08-23: qty 20

## 2018-08-23 MED ORDER — ONDANSETRON HCL 4 MG/2ML IJ SOLN
4.0000 mg | Freq: Four times a day (QID) | INTRAMUSCULAR | Status: DC | PRN
  Filled 2018-08-23: qty 2

## 2018-08-23 MED ORDER — OXYCODONE HCL 5 MG PO TABS
5.0000 mg | ORAL_TABLET | Freq: Once | ORAL | Status: DC | PRN
  Filled 2018-08-23: qty 1

## 2018-08-23 MED ORDER — DEXAMETHASONE SODIUM PHOSPHATE 10 MG/ML IJ SOLN
INTRAMUSCULAR | Status: AC
  Filled 2018-08-23: qty 1

## 2018-08-23 MED ORDER — LACTATED RINGERS IV SOLN
INTRAVENOUS | Status: DC
  Filled 2018-08-23: qty 1000

## 2018-08-23 MED ORDER — KETOROLAC TROMETHAMINE 30 MG/ML IJ SOLN
INTRAMUSCULAR | Status: AC
  Filled 2018-08-23: qty 1

## 2018-08-23 MED ORDER — LIDOCAINE 2% (20 MG/ML) 5 ML SYRINGE
INTRAMUSCULAR | Status: AC
  Filled 2018-08-23: qty 5

## 2018-08-23 MED ORDER — LIDOCAINE HCL 1 % IJ SOLN
INTRAMUSCULAR | Status: AC
  Filled 2018-08-23: qty 20

## 2018-08-23 MED ORDER — FENTANYL CITRATE (PF) 100 MCG/2ML IJ SOLN
INTRAMUSCULAR | Status: AC
  Filled 2018-08-23: qty 2

## 2018-08-23 MED ORDER — PROPOFOL 10 MG/ML IV BOLUS
INTRAVENOUS | Status: DC | PRN
  Administered 2018-08-23: 200 mg via INTRAVENOUS

## 2018-08-23 MED ORDER — LACTATED RINGERS IV SOLN
INTRAVENOUS | Status: DC
  Administered 2018-08-23: 125 mL/h via INTRAVENOUS
  Filled 2018-08-23: qty 1000

## 2018-08-23 MED ORDER — ONDANSETRON HCL 4 MG/2ML IJ SOLN
INTRAMUSCULAR | Status: AC
  Filled 2018-08-23: qty 2

## 2018-08-23 MED ORDER — FENTANYL CITRATE (PF) 100 MCG/2ML IJ SOLN
25.0000 ug | INTRAMUSCULAR | Status: DC | PRN
  Administered 2018-08-23: 25 ug via INTRAVENOUS
  Filled 2018-08-23: qty 1

## 2018-08-23 MED ORDER — DEXAMETHASONE SODIUM PHOSPHATE 10 MG/ML IJ SOLN
INTRAMUSCULAR | Status: DC | PRN
  Administered 2018-08-23: 10 mg via INTRAVENOUS

## 2018-08-23 MED ORDER — FENTANYL CITRATE (PF) 100 MCG/2ML IJ SOLN
INTRAMUSCULAR | Status: DC | PRN
  Administered 2018-08-23: 25 ug via INTRAVENOUS
  Administered 2018-08-23: 50 ug via INTRAVENOUS

## 2018-08-23 MED ORDER — LIDOCAINE HCL 1 % IJ SOLN
INTRAMUSCULAR | Status: DC | PRN
  Administered 2018-08-23: 10 mL

## 2018-08-23 MED ORDER — SODIUM CHLORIDE 0.9 % IR SOLN
Status: DC | PRN
  Administered 2018-08-23: 3000 mL

## 2018-08-23 MED ORDER — LIDOCAINE 2% (20 MG/ML) 5 ML SYRINGE
INTRAMUSCULAR | Status: DC | PRN
  Administered 2018-08-23: 60 mg via INTRAVENOUS

## 2018-08-23 SURGICAL SUPPLY — 17 items
CANISTER SUCT 3000ML PPV (MISCELLANEOUS) ×2 IMPLANT
CATH ROBINSON RED A/P 16FR (CATHETERS) ×2 IMPLANT
COVER WAND RF STERILE (DRAPES) ×2 IMPLANT
DEVICE MYOSURE LITE (MISCELLANEOUS) ×1 IMPLANT
DEVICE MYOSURE REACH (MISCELLANEOUS) IMPLANT
DILATOR CANAL MILEX (MISCELLANEOUS) IMPLANT
GAUZE 4X4 16PLY RFD (DISPOSABLE) ×2 IMPLANT
GLOVE BIO SURGEON STRL SZ 6.5 (GLOVE) ×2 IMPLANT
GOWN STRL REUS W/TWL LRG LVL3 (GOWN DISPOSABLE) ×2 IMPLANT
IV NS IRRIG 3000ML ARTHROMATIC (IV SOLUTION) ×2 IMPLANT
KIT PROCEDURE FLUENT (KITS) ×2 IMPLANT
MYOSURE XL FIBROID (MISCELLANEOUS)
PACK VAGINAL MINOR WOMEN LF (CUSTOM PROCEDURE TRAY) ×2 IMPLANT
PAD OB MATERNITY 4.3X12.25 (PERSONAL CARE ITEMS) ×2 IMPLANT
SEAL ROD LENS SCOPE MYOSURE (ABLATOR) ×2 IMPLANT
SYSTEM TISS REMOVAL MYOSURE XL (MISCELLANEOUS) IMPLANT
TOWEL OR 17X26 10 PK STRL BLUE (TOWEL DISPOSABLE) ×4 IMPLANT

## 2018-08-23 NOTE — Op Note (Signed)
OPERATIVE REPORT   PREOPERATIVE DIAGNOSES:  Postmenopausal bleeding, fibroids, possible endometrial mass.   POSTOPERATIVE DIAGNOSES:   Postmenopausal bleeding, fibroids, endometrial polyps.   PROCEDURE:  Hysteroscopy with dilation and curettage and Myosure resection of endometrial polyps.  SURGEON:  Lenard Galloway, MD  ANESTHESIA:  LMA, paracervical block with 10 mL of 1% lidocaine.  IV FLUIDS:   700 cc LR  EBL:  5 cc.   URINE OUTPUT:  10 cc.   NORMAL SALINE DEFICIT:   235 cc.  COMPLICATIONS:  None.  INDICATIONS FOR THE PROCEDURE:     The patient is a 68 year old para 2 Caucasian female who presents with post menopausal bleeding.  The patient had atrophy on examination.  Office ultrasound showed fibroids and difficulty assessing the endometrial both with pelvic ultrasound and with sonohysterogram.  There was a calcified area though to be consistent with a possible submucous fibroid.  An endometrial biopsy showed atrophic endometrium.  The patient has a history of a hysteroscopic resection of a submucous fibroid.  She has a family history of endometrial cancer.  A plan is now made to proceed with a hysteroscopy with dilation and curettage and Myosure resection of endometrial mass; after risks, benefits and alternatives were reviewed.  FINDINGS:  Exam under anesthesia revealed a small uterus and no adnexal masses.  Obvious atrophy was noted in the vagina. The uterus was sounded to almost 7 cm.  Hysteroscopy showed 2 polyps, one on the posterior lower uterine segment and one in the posterior fundus.  The patient also had a right fundal fibroid which appeared to encroach on the endometrium.  The left cervical wall demonstrated a 5 mm projection which appeared to be a possible fibroid.  This was biopsied with the Myosure and sent with the endometrial polyp specimen.  Endometrial currettings were scanty.  SPECIMENS:  The endometrial polyps and endometrial curettings were sent to Pathology  separately.   PROCEDURE IN DETAIL:  The patient was reidentified in the preoperative hold area.   She received TED hose and PAS stockings for DVT prophylaxis.  In the operating room, the patient was placed in the dorsal lithotomy position and then an LMA anesthetic was introduced.  The patient's lower abdomen, vagina and perineum were sterilely prepped, and the  patient's bladder was catheterized of urine.  She was sterilly draped.  An exam under anesthesia was performed.  A speculum was placed inside the vagina and a single-tooth tenaculum was placed on the anterior cervical lip.  A paracervical block was performed with a total of 10 mL of 1% lidocaine plain.  The uterus was sounded. The cervix was dilated to a #23 Pratt dilator.  The MyoSure hysteroscope was then inserted inside the uterine cavity under the continuous infusion of normal saline solution.  Findings are as noted above.  The MyoSure hysteroscope was removed after the polyps were resected.  The specimen was sent to pathology.  The serrated curette was introduced into the uterine cavity  and the endometrium was curetted in all 4 quadrants.   A minimal amount of  endometrial curettings was obtained.  This specimen was sent to Pathology.  The single-tooth tenaculum which had been placed on the anterior cervical lip was removed.  Hemostasis was good, and all of the vaginal instruments  were removed.  The patient was awakened and escorted to the recovery room in stable condition after she was cleansed of surgical soap.  There were no complications to the procedure.  All needle, instrument  and sponge counts were correct.  Lenard Galloway, MD

## 2018-08-23 NOTE — Progress Notes (Signed)
Update to History and Physical  No marked change in status since office pre-op visit.   Patient examined.   OK to proceed with surgery. 

## 2018-08-23 NOTE — Discharge Instructions (Signed)
Dilation and Curettage or Vacuum Curettage, Care After These instructions give you information about caring for yourself after your procedure. Your doctor may also give you more specific instructions. Call your doctor if you have any problems or questions after your procedure. Follow these instructions at home: Activity  Do not drive or use heavy machinery while taking prescription pain medicine.  For 24 hours after your procedure, avoid driving.  Take short walks often, followed by rest periods. Ask your doctor what activities are safe for you. After one or two days, you may be able to return to your normal activities.  Do not lift anything that is heavier than 10 lb (4.5 kg) until your doctor approves.  For at least 2 weeks, or as long as told by your doctor: ? Do not douche. ? Do not use tampons. ? Do not have sex. General instructions   Take over-the-counter and prescription medicines only as told by your doctor. This is very important if you take blood thinning medicine.  Do not take baths, swim, or use a hot tub until your doctor approves. Take showers instead of baths.  Wear compression stockings as told by your doctor.  It is up to you to get the results of your procedure. Ask your doctor when your results will be ready.  Keep all follow-up visits as told by your doctor. This is important. Contact a doctor if:  You have very bad cramps that get worse or do not get better with medicine.  You have very bad pain in your belly (abdomen).  You cannot drink fluids without throwing up (vomiting).  You get pain in a different part of the area between your belly and thighs (pelvis).  You have bad-smelling discharge from your vagina.  You have a rash. Get help right away if:  You are bleeding a lot from your vagina. A lot of bleeding means soaking more than one sanitary pad in an hour, for 2 hours in a row.  You have clumps of blood (blood clots) coming from your  vagina.  You have a fever or chills.  Your belly feels very tender or hard.  You have chest pain.  You have trouble breathing.  You cough up blood.  You feel dizzy.  You feel light-headed.  You pass out (faint).  You have pain in your neck or shoulder area. Summary  Take short walks often, followed by rest periods. Ask your doctor what activities are safe for you. After one or two days, you may be able to return to your normal activities.  Do not lift anything that is heavier than 10 lb (4.5 kg) until your doctor approves.  Do not take baths, swim, or use a hot tub until your doctor approves. Take showers instead of baths.  Contact your doctor if you have any symptoms of infection, like bad-smelling discharge from your vagina. This information is not intended to replace advice given to you by your health care provider. Make sure you discuss any questions you have with your health care provider. Document Released: 03/31/2008 Document Revised: 03/09/2016 Document Reviewed: 03/09/2016 Elsevier Interactive Patient Education  2019 [place]. Hysteroscopy, Care After This sheet gives you information about how to care for yourself after your procedure. Your health care provider may also give you more specific instructions. If you have problems or questions, contact your health care provider. What can I expect after the procedure? After the procedure, it is common to have:  Cramping.  Bleeding. This can  vary from light spotting to menstrual-like bleeding. Follow these instructions at home: Activity  Rest for 1-2 days after the procedure.  Do not douche, use tampons, or have sex for 2 weeks after the procedure, or until your health care provider approves.  Do not drive for 24 hours after the procedure, or for as long as told by your health care provider.  Do not drive, use heavy machinery, or drink alcohol while taking prescription pain medicines. Medicines   Take  over-the-counter and prescription medicines only as told by your health care provider.  Do not take aspirin during recovery. It can increase the risk of bleeding. General instructions  Do not take baths, swim, or use a hot tub until your health care provider approves. Take showers instead of baths for 2 weeks, or for as long as told by your health care provider.  To prevent or treat constipation while you are taking prescription pain medicine, your health care provider may recommend that you: ? Drink enough fluid to keep your urine clear or pale yellow. ? Take over-the-counter or prescription medicines. ? Eat foods that are high in fiber, such as fresh fruits and vegetables, whole grains, and beans. ? Limit foods that are high in fat and processed sugars, such as fried and sweet foods.  Keep all follow-up visits as told by your health care provider. This is important. Contact a health care provider if:  You feel dizzy or lightheaded.  You feel nauseous.  You have abnormal vaginal discharge.  You have a rash.  You have pain that does not get better with medicine.  You have chills. Get help right away if:  You have bleeding that is heavier than a normal menstrual period.  You have a fever.  You have pain or cramps that get worse.  You develop new abdominal pain.  You faint.  You have pain in your shoulders.  You have shortness of breath. Summary  After the procedure, you may have cramping and some vaginal bleeding.  Do not douche, use tampons, or have sex for 2 weeks after the procedure, or until your health care provider approves.  Do not take baths, swim, or use a hot tub until your health care provider approves. Take showers instead of baths for 2 weeks, or for as long as told by your health care provider.  Report any unusual symptoms to your health care provider.  Keep all follow-up visits as told by your health care provider. This is important. This information  is not intended to replace advice given to you by your health care provider. Make sure you discuss any questions you have with your health care provider. Document Released: 04/12/2013 Document Revised: 07/21/2016 Document Reviewed: 07/21/2016 Elsevier Interactive Patient Education  2019 [place] Anesthesia Home Care Instructions  Activity: Get plenty of rest for the remainder of the day. A responsible individual must stay with you for 24 hours following the procedure.  For the next 24 hours, DO NOT: -Drive a car -Paediatric nurse -Drink alcoholic beverages -Take any medication unless instructed by your physician -Make any legal decisions or sign important papers.  Meals: Start with liquid foods such as gelatin or soup. Progress to regular foods as tolerated. Avoid greasy, spicy, heavy foods. If nausea and/or vomiting occur, drink only clear liquids until the nausea and/or vomiting subsides. Call your physician if vomiting continues.  Special Instructions/Symptoms: Your throat may feel dry or sore from the anesthesia or the breathing tube  placed in your throat during surgery. If this causes discomfort, gargle with warm salt water. The discomfort should disappear within 24 hours.  If you had a scopolamine patch placed behind your ear for the management of post- operative nausea and/or vomiting:  1. The medication in the patch is effective for 72 hours, after which it should be removed.  Wrap patch in a tissue and discard in the trash. Wash hands thoroughly with soap and water. 2. You may remove the patch earlier than 72 hours if you experience unpleasant side effects which may include dry mouth, dizziness or visual disturbances. 3. Avoid touching the patch. Wash your hands with soap and water after contact with the patch.

## 2018-08-23 NOTE — Anesthesia Procedure Notes (Signed)
Procedure Name: LMA Insertion Date/Time: 08/23/2018 10:15 AM Performed by: Suan Halter, CRNA Pre-anesthesia Checklist: Patient identified, Emergency Drugs available, Suction available and Patient being monitored Patient Re-evaluated:Patient Re-evaluated prior to induction Oxygen Delivery Method: Circle system utilized Preoxygenation: Pre-oxygenation with 100% oxygen Induction Type: IV induction Ventilation: Mask ventilation without difficulty LMA: LMA inserted LMA Size: 4.0 Number of attempts: 1 Airway Equipment and Method: Bite block Placement Confirmation: positive ETCO2 Tube secured with: Tape Dental Injury: Teeth and Oropharynx as per pre-operative assessment

## 2018-08-23 NOTE — Transfer of Care (Signed)
Immediate Anesthesia Transfer of Care Note  Patient: [name]  Procedure(s) Performed: Procedure(s) (LRB): DILATATION & CURETTAGE/HYSTEROSCOPY WITH MYOSURE POLYPECTOMY (N/A)  Patient Location: PACU  Anesthesia Type: General  Level of Consciousness: awake, oriented, sedated and patient cooperative  Airway & Oxygen Therapy: Patient Spontanous Breathing and Patient connected to face mask oxygen  Post-op Assessment: Report given to PACU RN and Post -op Vital signs reviewed and stable  Post vital signs: Reviewed and stable  Complications: No apparent anesthesia complications  Last Vitals:  Vitals Value Taken Time  BP 130/67 08/23/2018 11:00 AM  Temp 36.6 C 08/23/2018 10:59 AM  Pulse 95 08/23/2018 11:03 AM  Resp 11 08/23/2018 11:03 AM  SpO2 92 % 08/23/2018 11:03 AM  Vitals shown include unvalidated device data.  Last Pain:  Vitals:   08/23/18 0842  TempSrc:   PainSc: 5       Patients Stated Pain Goal: 5 (08/23/18 0842)

## 2018-08-24 ENCOUNTER — Encounter (HOSPITAL_BASED_OUTPATIENT_CLINIC_OR_DEPARTMENT_OTHER): Payer: Self-pay | Admitting: Obstetrics and Gynecology

## 2018-08-25 NOTE — Anesthesia Postprocedure Evaluation (Signed)
Anesthesia Post Note  Patient: [name]  Procedure(s) Performed: DILATATION & CURETTAGE/HYSTEROSCOPY WITH MYOSURE POLYPECTOMY (N/A Vagina )     Patient location during evaluation: PACU Anesthesia Type: General Level of consciousness: awake and alert Pain management: pain level controlled Vital Signs Assessment: post-procedure vital signs reviewed and stable Respiratory status: spontaneous breathing, nonlabored ventilation, respiratory function stable and patient connected to nasal cannula oxygen Cardiovascular status: blood pressure returned to baseline and stable Postop Assessment: no apparent nausea or vomiting Anesthetic complications: no    Last Vitals:  Vitals:   08/23/18 1200 08/23/18 1245  BP:  (!) 143/80  Pulse: 86 87  Resp: 18 16  Temp:  36.5 C  SpO2: (!) 89% 99%    Last Pain:  Vitals:   08/23/18 1245  TempSrc:   PainSc: [place]

## 2018-09-05 NOTE — Progress Notes (Signed)
GYNECOLOGY  VISIT   HPI: 68 y.o.   Married  Caucasian  female   [phone] with Patient's last menstrual period was 07/06/1998 (approximate).   here for 2 week follow up [place] (N/A Vagina ).  Pathology - benign endometrial polyp.  Vaginal bleeding stopped last week.  Not too much pain.   Salley Scarlet for urinary frequency.  If she skips her dosage, she has increased frequency and leakage.  Reduced her caffeine use.   She has back pain and has difficulty exercising.  States she knows she needs to loose weight.   Sister has uterine cancer and did surgery and chemotherapy. Other sister has multiple myeloma.   GYNECOLOGIC HISTORY: Patient's last menstrual period was 07/06/1998 (approximate). Contraception: Postmenopausal Menopausal hormone therapy: none Last mammogram: 03/2018 at [facility], normal per patient Last pap smear:07-04-18 Neg with Dr.D.Gates, 06-01-16 Neg:Neg HR HPV          OB History    Gravida  3   Para      Term      Preterm      AB  1   Living  2     SAB  1   TAB      Ectopic      Multiple      Live Births                 Patient Active Problem List   Diagnosis Date Noted  . Mild persistent asthma without complication 83/41/9622  . Mixed rhinitis 06/10/2018  . Obstructive sleep apnea 12/11/2016  . Palpitations 06/17/2016  . Deflected nasal septum 09/13/2015  . Hypertrophy of nasal turbinates 09/13/2015  . Deviated nasal septum 06/21/2015    Class: Chronic  . HYPERLIPIDEMIA 11/18/2007  . Essential hypertension 11/18/2007  . Allergic rhinitis 11/18/2007  . COUGH 11/18/2007    Past Medical History:  Diagnosis Date  . Asthma   . Chronic back pain   . Diabetes mellitus without complication ([place])    no med  . GERD (gastroesophageal reflux disease)   . Hyperlipemia   . Hypertension   . Hypothyroidism   . Insomnia   . OAB (overactive bladder)   . Seasonal allergies   . Sleep  apnea    uses a cpap    Past Surgical History:  Procedure Laterality Date  . ANKLE ARTHROTOMY  2003   left-fx  . APPENDECTOMY    . BACK SURGERY  2010   lumb disk  . CARPAL TUNNEL RELEASE     rt  . CARPAL TUNNEL RELEASE Left 08/01/2013   Procedure: LEFT CARPAL TUNNEL RELEASE;  Surgeon: Wynonia Sours, MD;  Location: [place];  Service: Orthopedics;  Laterality: Left;  . CESAREAN SECTION  83,86  . COLONOSCOPY    . DILATATION & CURETTAGE/HYSTEROSCOPY WITH MYOSURE N/A 08/23/2018   Procedure: DILATATION & CURETTAGE/HYSTEROSCOPY WITH MYOSURE POLYPECTOMY;  Surgeon: Nunzio Cobbs, MD;  Location: [facility];  Service: Gynecology;  Laterality: N/A;  fibroid resection  . DILATION AND CURETTAGE OF UTERUS     x2  . MASS EXCISION N/A 08/04/2017   Procedure: EXCISION LIPOMA UPPER BACK x2 AND NECK;  Surgeon: Erroll Luna, MD;  Location: [place];  Service: General;  Laterality: N/A;  . NASAL SEPTOPLASTY W/ TURBINOPLASTY Bilateral 06/21/2015   Procedure: NASAL SEPTOPLASTY WITH BILATERAL TURBINATE REDUCTION;  Surgeon: Jerrell Belfast, MD;  Location: [place];  Service: ENT;  Laterality: Bilateral;  .  SINOSCOPY    . TONSILLECTOMY    . TUBAL LIGATION      Current Outpatient Medications  Medication Sig Dispense Refill  . 5-Hydroxytryptophan (5-HTP MAXIMUM STRENGTH) 200 MG CAPS Take 200 mg by mouth daily.    Marland Kitchen albuterol (PROAIR HFA) 108 (90 Base) MCG/ACT inhaler Inhale 2 puffs into the lungs every 4 (four) hours as needed for wheezing or shortness of breath. 1 Inhaler 3  . amLODipine (NORVASC) 5 MG tablet Take 5 mg by mouth daily.    Marland Kitchen azelastine (ASTELIN) 0.1 % nasal spray USE 2 SPRAYS IN EACH  NOSTRIL TWO TIMES DAILY 60 mL 5  . CINNAMON PO 2 capsules Orally once a day    . cyclobenzaprine (FLEXERIL) 10 MG tablet 1 tablet Orally twice a day as needed for pain    . econazole nitrate 1 % cream 1 application to affected area Externally Once a day  for 4 weeks    . famotidine-calcium carbonate-magnesium hydroxide (PEPCID COMPLETE) 10-800-165 MG chewable tablet 1 tablet Orally Twice a day    . fesoterodine (TOVIAZ) 8 MG TB24 tablet Take 8 mg by mouth at bedtime.     . fluticasone (FLOVENT HFA) 110 MCG/ACT inhaler Inhale 2 puffs into the lungs 2 (two) times daily. 1 Inhaler 5  . gabapentin (NEURONTIN) 300 MG capsule Take 600 mg by mouth 3 (three) times daily.     Marland Kitchen GLUCOSAMINE-CHONDROITIN PO Take 1 tablet by mouth daily. Move Free Ultra Triple Action    . glucose blood test strip as directed finger stick once a day for 90 days    . hydrochlorothiazide (MICROZIDE) 12.5 MG capsule Take 12.5 mg by mouth daily.    . hydrocortisone-pramoxine (PROCTOFOAM HC) rectal foam 1 application as needed Rectal Four times a day if needed for hemorrhoid for 7 days    . levothyroxine (SYNTHROID, LEVOTHROID) 100 MCG tablet     . losartan (COZAAR) 100 MG tablet Take 100 mg by mouth daily.    . Melatonin 3 MG TABS Take 3 mg by mouth as needed.    . meloxicam (MOBIC) 7.5 MG tablet Take 7.5 mg by mouth See admin instructions. Take 1 tablet (7.5 mg) by mouth every morning, may take an additional tablet at bedtime as needed for pain    . metFORMIN (GLUCOPHAGE-XR) 500 MG 24 hr tablet 1 tablet with evening meal Orally Once a day for 90 days    . montelukast (SINGULAIR) 10 MG tablet Take 10 mg by mouth at bedtime.    . Multiple Vitamin (MULTIVITAMIN WITH MINERALS) TABS tablet Take 1 tablet by mouth at bedtime.    . NON FORMULARY Hemp Extract Cream Externally three times a day as needed for back and hip pain    . Omega-3 Fatty Acids (FISH OIL) 1200 MG CAPS Take 1,200 mg by mouth daily.    . simvastatin (ZOCOR) 20 MG tablet Take 20 mg by mouth at bedtime.     . Turmeric 500 MG CAPS 2 capsules Orally once a day    . zolpidem (AMBIEN) 10 MG tablet Take 5 mg by mouth at bedtime as needed for sleep.      No current facility-administered medications for this visit.       ALLERGIES: Patient has no known allergies.  Family History  Problem Relation Age of Onset  . Allergic rhinitis Father   . Allergic rhinitis Sister   . Asthma Sister   . Uterine cancer Sister 3  . Asthma Paternal Aunt   .  Multiple myeloma Sister   . Angioedema Neg Hx   . Eczema Neg Hx   . Immunodeficiency Neg Hx   . Urticaria Neg Hx     Social History   Socioeconomic History  . Marital status: Married    Spouse name: Not on file  . Number of children: Not on file  . Years of education: Not on file  . Highest education level: Not on file  Occupational History  . Not on file  Social Needs  . Financial resource strain: Not on file  . Food insecurity:    Worry: Not on file    Inability: Not on file  . Transportation needs:    Medical: Not on file    Non-medical: Not on file  Tobacco Use  . Smoking status: Never Smoker  . Smokeless tobacco: Never Used  Substance and Sexual Activity  . Alcohol use: Yes    Comment: occ  . Drug use: No  . Sexual activity: Yes    Birth control/protection: Post-menopausal  Lifestyle  . Physical activity:    Days per week: Not on file    Minutes per session: Not on file  . Stress: Not on file  Relationships  . Social connections:    Talks on phone: Not on file    Gets together: Not on file    Attends religious service: Not on file    Active member of club or organization: Not on file    Attends meetings of clubs or organizations: Not on file    Relationship status: Not on file  . Intimate partner violence:    Fear of current or ex partner: Not on file    Emotionally abused: Not on file    Physically abused: Not on file    Forced sexual activity: Not on file  Other Topics Concern  . Not on file  Social History Narrative  . Not on file    Review of Systems  All other systems reviewed and are negative.   PHYSICAL EXAMINATION:    BP 134/76 (BP Location: Right Arm, Patient Position: Sitting, Cuff Size: Large)   Pulse 84    Ht _0  (1.676 m)   Wt 234 lb 9.6 oz (106.4 kg)   LMP 07/06/1998 (Approximate)   BMI 37.87 kg/m     General appearance: alert, cooperative and appears stated age    Pelvic: External genitalia:  Left labia minora with 8 mm scar and 1 mm excoriated area.               Urethra:  normal appearing urethra with no masses, tenderness or lesions              Bartholins and Skenes: normal                 Vagina: normal appearing vagina with normal color and discharge, no lesions              Cervix: no lesions                Bimanual Exam:  Uterus:  normal size, contour, position, consistency, mobility, non-tender              Adnexa: no mass, fullness, tenderness               Chaperone was present for exam.  ASSESSMENT  Postmenopausal bleeding.  Status post hysteroscopy with Myosure resection of endometrial polyp and tiny fibroid.  Left labial scar.  DM.  Obesity.  Back pain. Overactive bladder.  Urinary frequency.   PLAN  We reviewed her surgical care, findings, and pathology report.  We discussed weight loss as a way to reduce risk of endometrial cancer and improve urinary frequency.  Bladder irritants discussed.  She wishes to do her annual GYN exams here and will [facility] in Jan. 2021. Call for any future vaginal bleeding or irritation of the labia.   An After Visit Summary was printed and given to the patient.

## 2018-09-07 ENCOUNTER — Other Ambulatory Visit: Payer: Self-pay

## 2018-09-07 ENCOUNTER — Encounter: Payer: Self-pay | Admitting: Obstetrics and Gynecology

## 2018-09-07 ENCOUNTER — Ambulatory Visit (INDEPENDENT_AMBULATORY_CARE_PROVIDER_SITE_OTHER): Payer: Medicare HMO | Admitting: Obstetrics and Gynecology

## 2018-09-07 VITALS — BP 134/76 | HR 84 | Ht 66.0 in | Wt 234.6 lb

## 2018-09-07 DIAGNOSIS — Z9889 Other specified postprocedural states: Secondary | ICD-10-CM

## 2018-09-15 DIAGNOSIS — M419 Scoliosis, unspecified: Secondary | ICD-10-CM | POA: Diagnosis not present

## 2018-09-15 DIAGNOSIS — M549 Dorsalgia, unspecified: Secondary | ICD-10-CM | POA: Diagnosis not present

## 2018-09-15 DIAGNOSIS — M545 Low back pain: Secondary | ICD-10-CM | POA: Diagnosis not present

## 2018-09-26 DIAGNOSIS — M5416 Radiculopathy, lumbar region: Secondary | ICD-10-CM | POA: Diagnosis not present

## 2018-10-19 DIAGNOSIS — M5136 Other intervertebral disc degeneration, lumbar region: Secondary | ICD-10-CM | POA: Diagnosis not present

## 2018-10-19 DIAGNOSIS — M419 Scoliosis, unspecified: Secondary | ICD-10-CM | POA: Diagnosis not present

## 2018-10-19 DIAGNOSIS — M5416 Radiculopathy, lumbar region: Secondary | ICD-10-CM | POA: Diagnosis not present

## 2018-10-31 ENCOUNTER — Other Ambulatory Visit: Payer: Self-pay

## 2018-10-31 DIAGNOSIS — M5416 Radiculopathy, lumbar region: Secondary | ICD-10-CM | POA: Diagnosis not present

## 2018-10-31 MED ORDER — FLUTICASONE PROPIONATE HFA 110 MCG/ACT IN AERO: 2.0000 | INHALATION_SPRAY | Freq: Two times a day (BID) | RESPIRATORY_TRACT | 5 refills | Status: DC

## 2018-11-10 DIAGNOSIS — N390 Urinary tract infection, site not specified: Secondary | ICD-10-CM | POA: Diagnosis not present

## 2018-11-29 DIAGNOSIS — R399 Unspecified symptoms and signs involving the genitourinary system: Secondary | ICD-10-CM | POA: Diagnosis not present

## 2018-12-01 DIAGNOSIS — M419 Scoliosis, unspecified: Secondary | ICD-10-CM | POA: Diagnosis not present

## 2018-12-01 DIAGNOSIS — M5416 Radiculopathy, lumbar region: Secondary | ICD-10-CM | POA: Diagnosis not present

## 2018-12-01 DIAGNOSIS — M4716 Other spondylosis with myelopathy, lumbar region: Secondary | ICD-10-CM | POA: Diagnosis not present

## 2018-12-01 DIAGNOSIS — M48062 Spinal stenosis, lumbar region with neurogenic claudication: Secondary | ICD-10-CM | POA: Diagnosis not present

## 2018-12-05 DIAGNOSIS — E039 Hypothyroidism, unspecified: Secondary | ICD-10-CM | POA: Diagnosis not present

## 2018-12-05 DIAGNOSIS — M1990 Unspecified osteoarthritis, unspecified site: Secondary | ICD-10-CM | POA: Diagnosis not present

## 2018-12-05 DIAGNOSIS — E1142 Type 2 diabetes mellitus with diabetic polyneuropathy: Secondary | ICD-10-CM | POA: Diagnosis not present

## 2018-12-05 DIAGNOSIS — E785 Hyperlipidemia, unspecified: Secondary | ICD-10-CM | POA: Diagnosis not present

## 2018-12-05 DIAGNOSIS — I10 Essential (primary) hypertension: Secondary | ICD-10-CM | POA: Diagnosis not present

## 2018-12-05 DIAGNOSIS — G8929 Other chronic pain: Secondary | ICD-10-CM | POA: Diagnosis not present

## 2018-12-05 DIAGNOSIS — G4700 Insomnia, unspecified: Secondary | ICD-10-CM | POA: Diagnosis not present

## 2018-12-05 DIAGNOSIS — J45909 Unspecified asthma, uncomplicated: Secondary | ICD-10-CM | POA: Diagnosis not present

## 2018-12-05 DIAGNOSIS — K219 Gastro-esophageal reflux disease without esophagitis: Secondary | ICD-10-CM | POA: Diagnosis not present

## 2018-12-14 ENCOUNTER — Other Ambulatory Visit: Payer: Self-pay

## 2018-12-14 ENCOUNTER — Ambulatory Visit: Payer: Medicare HMO | Admitting: Allergy

## 2018-12-14 ENCOUNTER — Encounter: Payer: Self-pay | Admitting: Allergy

## 2018-12-14 VITALS — BP 120/58 | HR 92 | Temp 97.2°F | Resp 16 | Ht 65.35 in | Wt 240.2 lb

## 2018-12-14 DIAGNOSIS — J310 Chronic rhinitis: Secondary | ICD-10-CM | POA: Diagnosis not present

## 2018-12-14 DIAGNOSIS — J4530 Mild persistent asthma, uncomplicated: Secondary | ICD-10-CM | POA: Diagnosis not present

## 2018-12-14 MED ORDER — AZELASTINE HCL 0.1 % NA SOLN: 2.0000 | Freq: Two times a day (BID) | NASAL | 5 refills | Status: DC

## 2018-12-14 NOTE — Patient Instructions (Addendum)
Asthma  - Continue Flovent 110 g  2 puffs twice a day with a spacer to prevent cough or wheeze.   - Continue Singulair 10 mg daily  - ProAir 2 puffs every 4 hours as needed for cough or wheeze  Asthma control goals:   Full participation in all desired activities (may need albuterol before activity)  Albuterol use two time or less a week on average (not counting use with activity)  Cough interfering with sleep two time or less a month  Oral steroids no more than once a year  No hospitalizations   Mixed rhinitis  - Continue Singulair as above  - Continue Flonase 1-2 sprays each nostril daily as needed for congestion.  Use for 1-2 weeks at a time before stopping once symptoms improved  - Continue Astelin 1-2 sprays twice a day for nasal drainage/post-nasal drip  Follow-up in 6 months or sooner if needed.

## 2018-12-14 NOTE — Progress Notes (Signed)
Follow-up Note  RE: [name] MRN: [id number] DOB: Oct 14, 1950 Date of Office Visit: 12/14/2018   History of present illness: [name] is a 68 y.o. female presenting today for follow-up of asthma and mixed rhinitis.  She was last seen in the office on 06/10/2018 by our nurse practitioner Gareth Morgan.   She is planning to have back surgery in the future when she can get it scheduled within the next 2 months.   She states in January she had "some kind of virus" and she was treated with antibiotics by her PCP and symptoms resolved.    She is noticing that she is using her flovent twice a day more in the last 2 weeks.  She notices more cough and wheeze especially when she goes out to walk.  No nighttime awakenings.  She has not had any ED/UC visits or systemic steroid needs.    She has been using astelin spray which helps with nasal drainage control.  She also has needed to use flonase as well for congestion control.     Review of systems: Review of Systems  Constitutional: Negative for chills, fever and malaise/fatigue.  HENT: Positive for congestion. Negative for ear discharge, ear pain, nosebleeds, sinus pain and sore throat.   Eyes: Negative for pain, discharge and redness.  Respiratory: Positive for cough and wheezing. Negative for shortness of breath.   Cardiovascular: Negative for chest pain.  Gastrointestinal: Negative for abdominal pain, constipation, diarrhea, heartburn, nausea and vomiting.  Musculoskeletal: Negative for joint pain.  Skin: Negative for itching and rash.  Neurological: Negative for headaches.    All other systems negative unless noted above in HPI  Past medical/social/surgical/family history have been reviewed and are unchanged unless specifically indicated below.  No changes  Medication List: Allergies as of 12/14/2018   No Known Allergies     Medication List       Accurate as of December 14, 2018  3:50 PM. If you have any questions, ask your  nurse or doctor.        STOP taking these medications   cyclobenzaprine 10 MG tablet Commonly known as:  FLEXERIL Stopped by:  Carmyn Hamm Charmian Muff, MD   econazole nitrate 1 % cream Stopped by:  Tijuan Dantes Charmian Muff, MD     TAKE these medications   5-HTP Maximum Strength 200 MG Caps Generic drug:  5-Hydroxytryptophan Take 200 mg by mouth daily.   albuterol 108 (90 Base) MCG/ACT inhaler Commonly known as:  ProAir HFA Inhale 2 puffs into the lungs every 4 (four) hours as needed for wheezing or shortness of breath.   amLODipine 5 MG tablet Commonly known as:  NORVASC Take 5 mg by mouth daily.   azelastine 0.1 % nasal spray Commonly known as:  ASTELIN Place 2 sprays into both nostrils 2 (two) times daily. Use in each nostril as directed What changed:  See the new instructions. Changed by:  Laquincy Eastridge Charmian Muff, MD   baclofen 20 MG tablet Commonly known as:  LIORESAL Take 20 mg by mouth 3 (three) times daily as needed.   CINNAMON PO 2 capsules Orally once a day   Fish Oil 1200 MG Caps Take 1,200 mg by mouth daily.   fluticasone 110 MCG/ACT inhaler Commonly known as:  Flovent HFA Inhale 2 puffs into the lungs 2 (two) times daily.   gabapentin 300 MG capsule Commonly known as:  NEURONTIN Take 600 mg by mouth 3 (three) times daily.   GLUCOSAMINE-CHONDROITIN PO Take  1 tablet by mouth daily. Move Free Ultra Triple Action   glucose blood test strip as directed finger stick once a day for 90 days   hydrochlorothiazide 12.5 MG capsule Commonly known as:  MICROZIDE Take 12.5 mg by mouth daily.   levothyroxine 100 MCG tablet Commonly known as:  SYNTHROID   losartan 100 MG tablet Commonly known as:  COZAAR Take 100 mg by mouth daily.   Melatonin 3 MG Tabs Take 3 mg by mouth as needed.   meloxicam 7.5 MG tablet Commonly known as:  MOBIC Take 7.5 mg by mouth See admin instructions. Take 1 tablet (7.5 mg) by mouth every morning, may take an additional  tablet at bedtime as needed for pain   metFORMIN 500 MG 24 hr tablet Commonly known as:  GLUCOPHAGE-XR 1 tablet with evening meal Orally Once a day for 90 days   montelukast 10 MG tablet Commonly known as:  SINGULAIR Take 10 mg by mouth at bedtime.   multivitamin with minerals Tabs tablet Take 1 tablet by mouth at bedtime.   NON FORMULARY Hemp Extract Cream Externally three times a day as needed for back and hip pain   Pepcid Complete 10-800-165 MG chewable tablet Generic drug:  famotidine-calcium carbonate-magnesium hydroxide 1 tablet Orally Twice a day   Proctofoam HC rectal foam Generic drug:  hydrocortisone-pramoxine 1 application as needed Rectal Four times a day if needed for hemorrhoid for 7 days   simvastatin 20 MG tablet Commonly known as:  ZOCOR Take 20 mg by mouth at bedtime.   Toviaz 8 MG Tb24 tablet Generic drug:  fesoterodine Take 8 mg by mouth at bedtime.   Turmeric 500 MG Caps 2 capsules Orally once a day   zolpidem 10 MG tablet Commonly known as:  AMBIEN Take 5 mg by mouth at bedtime as needed for sleep.       Known medication allergies: No Known Allergies   Physical examination: Blood pressure (!) 120/58, pulse 92, temperature (!) 97.2 F (36.2 C), temperature source Temporal, resp. rate 16, height 5' 5.35" (1.66 m), weight 240 lb 3.2 oz (109 kg), last menstrual period 07/06/1998, SpO2 96 %.  General: Alert, interactive, in no acute distress. HEENT: PERRLA, TMs pearly gray, turbinates mildly edematous with clear discharge, post-pharynx non erythematous. Neck: Supple without lymphadenopathy. Lungs: Clear to auscultation without wheezing, rhonchi or rales. {no increased work of breathing. CV: Normal S1, S2 without murmurs. Abdomen: Nondistended, nontender. Skin: Warm and dry, without lesions or rashes. Extremities:  No clubbing, cyanosis or edema. Neuro:   Grossly intact.  Diagnositics/Labs: None today  Assessment and plan:   Asthma,  mild persistent  -  Continue Flovent 110 g  2 puffs twice a day with a spacer to prevent cough or wheeze.   - Continue Singulair 10 mg daily  - ProAir 2 puffs every 4 hours as needed for cough or wheeze  Asthma control goals:   Full participation in all desired activities (may need albuterol before activity)  Albuterol use two time or less a week on average (not counting use with activity)  Cough interfering with sleep two time or less a month  Oral steroids no more than once a year  No hospitalizations   Mixed rhinitis  - Continue Singulair as above  - Continue Flonase 1-2 sprays each nostril daily as needed for congestion.  Use for 1-2 weeks at a time before stopping once symptoms improved  - Continue Astelin 1-2 sprays twice a day for nasal drainage/post-nasal drip  Follow-up in 6 months or sooner if needed.  I appreciate the opportunity to take part in Lexianna's care. Please do not hesitate to contact me with questions.  Sincerely,   Prudy Feeler, MD Allergy/Immunology [facility]

## 2018-12-23 DIAGNOSIS — D2262 Melanocytic nevi of left upper limb, including shoulder: Secondary | ICD-10-CM | POA: Diagnosis not present

## 2018-12-23 DIAGNOSIS — D225 Melanocytic nevi of trunk: Secondary | ICD-10-CM | POA: Diagnosis not present

## 2018-12-23 DIAGNOSIS — L814 Other melanin hyperpigmentation: Secondary | ICD-10-CM | POA: Diagnosis not present

## 2018-12-23 DIAGNOSIS — L918 Other hypertrophic disorders of the skin: Secondary | ICD-10-CM | POA: Diagnosis not present

## 2018-12-23 DIAGNOSIS — L821 Other seborrheic keratosis: Secondary | ICD-10-CM | POA: Diagnosis not present

## 2018-12-23 DIAGNOSIS — D2271 Melanocytic nevi of right lower limb, including hip: Secondary | ICD-10-CM | POA: Diagnosis not present

## 2019-01-03 DIAGNOSIS — E039 Hypothyroidism, unspecified: Secondary | ICD-10-CM | POA: Diagnosis not present

## 2019-01-03 DIAGNOSIS — E782 Mixed hyperlipidemia: Secondary | ICD-10-CM | POA: Diagnosis not present

## 2019-01-03 DIAGNOSIS — L603 Nail dystrophy: Secondary | ICD-10-CM | POA: Diagnosis not present

## 2019-01-03 DIAGNOSIS — N3281 Overactive bladder: Secondary | ICD-10-CM | POA: Diagnosis not present

## 2019-01-03 DIAGNOSIS — E119 Type 2 diabetes mellitus without complications: Secondary | ICD-10-CM | POA: Diagnosis not present

## 2019-01-03 DIAGNOSIS — J4530 Mild persistent asthma, uncomplicated: Secondary | ICD-10-CM | POA: Diagnosis not present

## 2019-01-03 DIAGNOSIS — I10 Essential (primary) hypertension: Secondary | ICD-10-CM | POA: Diagnosis not present

## 2019-01-03 DIAGNOSIS — R69 Illness, unspecified: Secondary | ICD-10-CM | POA: Diagnosis not present

## 2019-01-11 ENCOUNTER — Encounter: Payer: Self-pay | Admitting: Podiatry

## 2019-01-11 ENCOUNTER — Ambulatory Visit: Payer: Medicare HMO | Admitting: Podiatry

## 2019-01-11 ENCOUNTER — Other Ambulatory Visit: Payer: Self-pay

## 2019-01-11 VITALS — BP 144/88 | HR 80 | Temp 98.1°F

## 2019-01-11 DIAGNOSIS — Z79899 Other long term (current) drug therapy: Secondary | ICD-10-CM | POA: Diagnosis not present

## 2019-01-11 DIAGNOSIS — B351 Tinea unguium: Secondary | ICD-10-CM | POA: Diagnosis not present

## 2019-01-11 MED ORDER — TERBINAFINE HCL 250 MG PO TABS: 250.0000 mg | ORAL_TABLET | Freq: Every day | ORAL | 0 refills | Status: DC

## 2019-01-12 LAB — HEPATIC FUNCTION PANEL
AG Ratio: 2 (calc) (ref 1.0–2.5)
ALT: 32 U/L — ABNORMAL HIGH (ref 6–29)
AST: 26 U/L (ref 10–35)
Albumin: 4.5 g/dL (ref 3.6–5.1)
Alkaline phosphatase (APISO): 85 U/L (ref 37–153)
Bilirubin, Direct: 0.1 mg/dL (ref 0.0–0.2)
Globulin: 2.3 g/dL (calc) (ref 1.9–3.7)
Indirect Bilirubin: 0.3 mg/dL (calc) (ref 0.2–1.2)
Total Bilirubin: 0.4 mg/dL (ref 0.2–1.2)
Total Protein: 6.8 g/dL (ref 6.1–8.1)

## 2019-01-16 NOTE — Progress Notes (Signed)
   Subjective: 68 y.o. female presenting today as a new patient with a chief complaint of discoloration and thickening of the toenails of bilateral feet that have been present for the past few years. She states she has had two different biopsies in the past four years and both came back negative for fungus. She has not had any recent treatment and denies modifying factors. Patient is here for further evaluation and treatment.   Past Medical History:  Diagnosis Date  . Asthma   . Chronic back pain   . Diabetes mellitus without complication ([place])    no med  . GERD (gastroesophageal reflux disease)   . Hyperlipemia   . Hypertension   . Hypothyroidism   . Insomnia   . OAB (overactive bladder)   . Seasonal allergies   . Sleep apnea    uses a cpap    Objective: Physical Exam General: The patient is alert and oriented x3 in no acute distress.  Dermatology: Hyperkeratotic, discolored, thickened, onychodystrophy of nails noted 1-5 bilaterally. Skin is warm, dry and supple bilateral lower extremities. Negative for open lesions or macerations.  Vascular: Palpable pedal pulses bilaterally. No edema or erythema noted. Capillary refill within normal limits.  Neurological: Epicritic and protective threshold grossly intact bilaterally.   Musculoskeletal Exam: Range of motion within normal limits to all pedal and ankle joints bilateral. Muscle strength 5/5 in all groups bilateral.   Assessment: #1 Onychomycosis noted to nails 1-5 bilaterally #2 Hyperkeratotic nails 1-5 bilaterally  Plan of Care:  #1 Patient was evaluated. #2 Order for liver function test placed.  #3 Prescription for Lamisil 250 mg #90 provided to patient.  #4 Return to clinic as needed.    Edrick Kins, DPM [facility]  Dr. Edrick Kins, [place]                                        [address], [place] [address]                Office [phone]  Fax [phone]

## 2019-01-18 DIAGNOSIS — M419 Scoliosis, unspecified: Secondary | ICD-10-CM | POA: Diagnosis not present

## 2019-01-18 DIAGNOSIS — M961 Postlaminectomy syndrome, not elsewhere classified: Secondary | ICD-10-CM | POA: Diagnosis not present

## 2019-01-18 DIAGNOSIS — M48062 Spinal stenosis, lumbar region with neurogenic claudication: Secondary | ICD-10-CM | POA: Diagnosis not present

## 2019-01-18 DIAGNOSIS — M5416 Radiculopathy, lumbar region: Secondary | ICD-10-CM | POA: Diagnosis not present

## 2019-01-19 DIAGNOSIS — M5432 Sciatica, left side: Secondary | ICD-10-CM | POA: Diagnosis not present

## 2019-01-19 DIAGNOSIS — M545 Low back pain: Secondary | ICD-10-CM | POA: Diagnosis not present

## 2019-01-19 DIAGNOSIS — M6281 Muscle weakness (generalized): Secondary | ICD-10-CM | POA: Diagnosis not present

## 2019-01-19 DIAGNOSIS — G8921 Chronic pain due to trauma: Secondary | ICD-10-CM | POA: Diagnosis not present

## 2019-01-23 DIAGNOSIS — G8921 Chronic pain due to trauma: Secondary | ICD-10-CM | POA: Diagnosis not present

## 2019-01-23 DIAGNOSIS — M5432 Sciatica, left side: Secondary | ICD-10-CM | POA: Diagnosis not present

## 2019-01-23 DIAGNOSIS — M545 Low back pain: Secondary | ICD-10-CM | POA: Diagnosis not present

## 2019-01-23 DIAGNOSIS — M6281 Muscle weakness (generalized): Secondary | ICD-10-CM | POA: Diagnosis not present

## 2019-01-25 DIAGNOSIS — M6281 Muscle weakness (generalized): Secondary | ICD-10-CM | POA: Diagnosis not present

## 2019-01-25 DIAGNOSIS — G8921 Chronic pain due to trauma: Secondary | ICD-10-CM | POA: Diagnosis not present

## 2019-01-25 DIAGNOSIS — M545 Low back pain: Secondary | ICD-10-CM | POA: Diagnosis not present

## 2019-01-25 DIAGNOSIS — M5432 Sciatica, left side: Secondary | ICD-10-CM | POA: Diagnosis not present

## 2019-01-30 DIAGNOSIS — M6281 Muscle weakness (generalized): Secondary | ICD-10-CM | POA: Diagnosis not present

## 2019-01-30 DIAGNOSIS — M545 Low back pain: Secondary | ICD-10-CM | POA: Diagnosis not present

## 2019-01-30 DIAGNOSIS — H2513 Age-related nuclear cataract, bilateral: Secondary | ICD-10-CM | POA: Diagnosis not present

## 2019-01-30 DIAGNOSIS — E119 Type 2 diabetes mellitus without complications: Secondary | ICD-10-CM | POA: Diagnosis not present

## 2019-01-30 DIAGNOSIS — H524 Presbyopia: Secondary | ICD-10-CM | POA: Diagnosis not present

## 2019-01-30 DIAGNOSIS — M5432 Sciatica, left side: Secondary | ICD-10-CM | POA: Diagnosis not present

## 2019-01-30 DIAGNOSIS — G8921 Chronic pain due to trauma: Secondary | ICD-10-CM | POA: Diagnosis not present

## 2019-02-01 DIAGNOSIS — M5432 Sciatica, left side: Secondary | ICD-10-CM | POA: Diagnosis not present

## 2019-02-01 DIAGNOSIS — M6281 Muscle weakness (generalized): Secondary | ICD-10-CM | POA: Diagnosis not present

## 2019-02-01 DIAGNOSIS — M545 Low back pain: Secondary | ICD-10-CM | POA: Diagnosis not present

## 2019-02-01 DIAGNOSIS — G8921 Chronic pain due to trauma: Secondary | ICD-10-CM | POA: Diagnosis not present

## 2019-02-03 DIAGNOSIS — M961 Postlaminectomy syndrome, not elsewhere classified: Secondary | ICD-10-CM | POA: Diagnosis not present

## 2019-02-03 DIAGNOSIS — M5136 Other intervertebral disc degeneration, lumbar region: Secondary | ICD-10-CM | POA: Diagnosis not present

## 2019-02-03 DIAGNOSIS — M545 Low back pain: Secondary | ICD-10-CM | POA: Diagnosis not present

## 2019-02-03 DIAGNOSIS — Z01818 Encounter for other preprocedural examination: Secondary | ICD-10-CM | POA: Diagnosis not present

## 2019-02-03 DIAGNOSIS — M419 Scoliosis, unspecified: Secondary | ICD-10-CM | POA: Diagnosis not present

## 2019-02-03 DIAGNOSIS — M4716 Other spondylosis with myelopathy, lumbar region: Secondary | ICD-10-CM | POA: Diagnosis not present

## 2019-02-03 DIAGNOSIS — M48062 Spinal stenosis, lumbar region with neurogenic claudication: Secondary | ICD-10-CM | POA: Diagnosis not present

## 2019-02-03 DIAGNOSIS — Z4689 Encounter for fitting and adjustment of other specified devices: Secondary | ICD-10-CM | POA: Diagnosis not present

## 2019-02-07 DIAGNOSIS — M4156 Other secondary scoliosis, lumbar region: Secondary | ICD-10-CM | POA: Diagnosis not present

## 2019-02-07 DIAGNOSIS — M4807 Spinal stenosis, lumbosacral region: Secondary | ICD-10-CM | POA: Diagnosis not present

## 2019-02-07 DIAGNOSIS — M4726 Other spondylosis with radiculopathy, lumbar region: Secondary | ICD-10-CM | POA: Diagnosis not present

## 2019-02-07 DIAGNOSIS — Z1159 Encounter for screening for other viral diseases: Secondary | ICD-10-CM | POA: Diagnosis not present

## 2019-02-07 DIAGNOSIS — Z01812 Encounter for preprocedural laboratory examination: Secondary | ICD-10-CM | POA: Diagnosis not present

## 2019-02-07 DIAGNOSIS — Z20828 Contact with and (suspected) exposure to other viral communicable diseases: Secondary | ICD-10-CM | POA: Diagnosis not present

## 2019-02-14 DIAGNOSIS — M4317 Spondylolisthesis, lumbosacral region: Secondary | ICD-10-CM | POA: Diagnosis not present

## 2019-02-14 DIAGNOSIS — Z981 Arthrodesis status: Secondary | ICD-10-CM | POA: Diagnosis not present

## 2019-02-14 DIAGNOSIS — M48062 Spinal stenosis, lumbar region with neurogenic claudication: Secondary | ICD-10-CM | POA: Diagnosis not present

## 2019-02-14 DIAGNOSIS — M961 Postlaminectomy syndrome, not elsewhere classified: Secondary | ICD-10-CM | POA: Diagnosis not present

## 2019-02-14 DIAGNOSIS — G4733 Obstructive sleep apnea (adult) (pediatric): Secondary | ICD-10-CM | POA: Diagnosis not present

## 2019-02-14 DIAGNOSIS — I10 Essential (primary) hypertension: Secondary | ICD-10-CM | POA: Diagnosis not present

## 2019-02-14 DIAGNOSIS — M4186 Other forms of scoliosis, lumbar region: Secondary | ICD-10-CM | POA: Diagnosis not present

## 2019-02-14 DIAGNOSIS — J45909 Unspecified asthma, uncomplicated: Secondary | ICD-10-CM | POA: Diagnosis not present

## 2019-02-14 DIAGNOSIS — M4327 Fusion of spine, lumbosacral region: Secondary | ICD-10-CM | POA: Diagnosis not present

## 2019-02-14 DIAGNOSIS — M4326 Fusion of spine, lumbar region: Secondary | ICD-10-CM | POA: Diagnosis not present

## 2019-02-14 DIAGNOSIS — M4726 Other spondylosis with radiculopathy, lumbar region: Secondary | ICD-10-CM | POA: Diagnosis not present

## 2019-02-14 DIAGNOSIS — M4716 Other spondylosis with myelopathy, lumbar region: Secondary | ICD-10-CM | POA: Diagnosis not present

## 2019-02-14 DIAGNOSIS — M545 Low back pain: Secondary | ICD-10-CM | POA: Diagnosis not present

## 2019-02-14 DIAGNOSIS — M5136 Other intervertebral disc degeneration, lumbar region: Secondary | ICD-10-CM | POA: Diagnosis not present

## 2019-02-14 DIAGNOSIS — Z79890 Hormone replacement therapy (postmenopausal): Secondary | ICD-10-CM | POA: Diagnosis not present

## 2019-02-14 DIAGNOSIS — Z79899 Other long term (current) drug therapy: Secondary | ICD-10-CM | POA: Diagnosis not present

## 2019-02-14 DIAGNOSIS — M419 Scoliosis, unspecified: Secondary | ICD-10-CM | POA: Diagnosis not present

## 2019-02-14 DIAGNOSIS — E119 Type 2 diabetes mellitus without complications: Secondary | ICD-10-CM | POA: Diagnosis not present

## 2019-02-14 DIAGNOSIS — M4807 Spinal stenosis, lumbosacral region: Secondary | ICD-10-CM | POA: Diagnosis not present

## 2019-02-14 DIAGNOSIS — M4156 Other secondary scoliosis, lumbar region: Secondary | ICD-10-CM | POA: Diagnosis not present

## 2019-02-14 HISTORY — PX: OTHER SURGICAL HISTORY: SHX169

## 2019-02-16 DIAGNOSIS — Z6837 Body mass index (BMI) 37.0-37.9, adult: Secondary | ICD-10-CM | POA: Diagnosis not present

## 2019-02-16 DIAGNOSIS — M419 Scoliosis, unspecified: Secondary | ICD-10-CM | POA: Diagnosis not present

## 2019-02-16 DIAGNOSIS — I10 Essential (primary) hypertension: Secondary | ICD-10-CM | POA: Diagnosis not present

## 2019-02-16 DIAGNOSIS — M4317 Spondylolisthesis, lumbosacral region: Secondary | ICD-10-CM | POA: Diagnosis not present

## 2019-02-16 DIAGNOSIS — Z79891 Long term (current) use of opiate analgesic: Secondary | ICD-10-CM | POA: Diagnosis not present

## 2019-02-16 DIAGNOSIS — M5136 Other intervertebral disc degeneration, lumbar region: Secondary | ICD-10-CM | POA: Diagnosis not present

## 2019-02-16 DIAGNOSIS — Z7951 Long term (current) use of inhaled steroids: Secondary | ICD-10-CM | POA: Diagnosis not present

## 2019-02-16 DIAGNOSIS — Z4789 Encounter for other orthopedic aftercare: Secondary | ICD-10-CM | POA: Diagnosis not present

## 2019-02-16 DIAGNOSIS — M4716 Other spondylosis with myelopathy, lumbar region: Secondary | ICD-10-CM | POA: Diagnosis not present

## 2019-02-18 DIAGNOSIS — Z6837 Body mass index (BMI) 37.0-37.9, adult: Secondary | ICD-10-CM | POA: Diagnosis not present

## 2019-02-18 DIAGNOSIS — Z4789 Encounter for other orthopedic aftercare: Secondary | ICD-10-CM | POA: Diagnosis not present

## 2019-02-18 DIAGNOSIS — I10 Essential (primary) hypertension: Secondary | ICD-10-CM | POA: Diagnosis not present

## 2019-02-18 DIAGNOSIS — M4317 Spondylolisthesis, lumbosacral region: Secondary | ICD-10-CM | POA: Diagnosis not present

## 2019-02-18 DIAGNOSIS — M4716 Other spondylosis with myelopathy, lumbar region: Secondary | ICD-10-CM | POA: Diagnosis not present

## 2019-02-18 DIAGNOSIS — Z79891 Long term (current) use of opiate analgesic: Secondary | ICD-10-CM | POA: Diagnosis not present

## 2019-02-18 DIAGNOSIS — M419 Scoliosis, unspecified: Secondary | ICD-10-CM | POA: Diagnosis not present

## 2019-02-18 DIAGNOSIS — Z7951 Long term (current) use of inhaled steroids: Secondary | ICD-10-CM | POA: Diagnosis not present

## 2019-02-18 DIAGNOSIS — M5136 Other intervertebral disc degeneration, lumbar region: Secondary | ICD-10-CM | POA: Diagnosis not present

## 2019-02-20 DIAGNOSIS — Z6837 Body mass index (BMI) 37.0-37.9, adult: Secondary | ICD-10-CM | POA: Diagnosis not present

## 2019-02-20 DIAGNOSIS — I10 Essential (primary) hypertension: Secondary | ICD-10-CM | POA: Diagnosis not present

## 2019-02-20 DIAGNOSIS — Z4789 Encounter for other orthopedic aftercare: Secondary | ICD-10-CM | POA: Diagnosis not present

## 2019-02-20 DIAGNOSIS — M4317 Spondylolisthesis, lumbosacral region: Secondary | ICD-10-CM | POA: Diagnosis not present

## 2019-02-20 DIAGNOSIS — M5136 Other intervertebral disc degeneration, lumbar region: Secondary | ICD-10-CM | POA: Diagnosis not present

## 2019-02-20 DIAGNOSIS — M4716 Other spondylosis with myelopathy, lumbar region: Secondary | ICD-10-CM | POA: Diagnosis not present

## 2019-02-20 DIAGNOSIS — Z79891 Long term (current) use of opiate analgesic: Secondary | ICD-10-CM | POA: Diagnosis not present

## 2019-02-20 DIAGNOSIS — Z7951 Long term (current) use of inhaled steroids: Secondary | ICD-10-CM | POA: Diagnosis not present

## 2019-02-20 DIAGNOSIS — M419 Scoliosis, unspecified: Secondary | ICD-10-CM | POA: Diagnosis not present

## 2019-02-22 DIAGNOSIS — M419 Scoliosis, unspecified: Secondary | ICD-10-CM | POA: Diagnosis not present

## 2019-02-22 DIAGNOSIS — Z6837 Body mass index (BMI) 37.0-37.9, adult: Secondary | ICD-10-CM | POA: Diagnosis not present

## 2019-02-22 DIAGNOSIS — Z79891 Long term (current) use of opiate analgesic: Secondary | ICD-10-CM | POA: Diagnosis not present

## 2019-02-22 DIAGNOSIS — I10 Essential (primary) hypertension: Secondary | ICD-10-CM | POA: Diagnosis not present

## 2019-02-22 DIAGNOSIS — M4716 Other spondylosis with myelopathy, lumbar region: Secondary | ICD-10-CM | POA: Diagnosis not present

## 2019-02-22 DIAGNOSIS — M5136 Other intervertebral disc degeneration, lumbar region: Secondary | ICD-10-CM | POA: Diagnosis not present

## 2019-02-22 DIAGNOSIS — Z7951 Long term (current) use of inhaled steroids: Secondary | ICD-10-CM | POA: Diagnosis not present

## 2019-02-22 DIAGNOSIS — M4317 Spondylolisthesis, lumbosacral region: Secondary | ICD-10-CM | POA: Diagnosis not present

## 2019-02-22 DIAGNOSIS — Z4789 Encounter for other orthopedic aftercare: Secondary | ICD-10-CM | POA: Diagnosis not present

## 2019-02-27 DIAGNOSIS — M5136 Other intervertebral disc degeneration, lumbar region: Secondary | ICD-10-CM | POA: Diagnosis not present

## 2019-02-27 DIAGNOSIS — Z7951 Long term (current) use of inhaled steroids: Secondary | ICD-10-CM | POA: Diagnosis not present

## 2019-02-27 DIAGNOSIS — M4317 Spondylolisthesis, lumbosacral region: Secondary | ICD-10-CM | POA: Diagnosis not present

## 2019-02-27 DIAGNOSIS — M4716 Other spondylosis with myelopathy, lumbar region: Secondary | ICD-10-CM | POA: Diagnosis not present

## 2019-02-27 DIAGNOSIS — Z4789 Encounter for other orthopedic aftercare: Secondary | ICD-10-CM | POA: Diagnosis not present

## 2019-02-27 DIAGNOSIS — M419 Scoliosis, unspecified: Secondary | ICD-10-CM | POA: Diagnosis not present

## 2019-02-27 DIAGNOSIS — Z79891 Long term (current) use of opiate analgesic: Secondary | ICD-10-CM | POA: Diagnosis not present

## 2019-02-27 DIAGNOSIS — I10 Essential (primary) hypertension: Secondary | ICD-10-CM | POA: Diagnosis not present

## 2019-02-27 DIAGNOSIS — Z6837 Body mass index (BMI) 37.0-37.9, adult: Secondary | ICD-10-CM | POA: Diagnosis not present

## 2019-03-01 DIAGNOSIS — M5136 Other intervertebral disc degeneration, lumbar region: Secondary | ICD-10-CM | POA: Diagnosis not present

## 2019-03-01 DIAGNOSIS — M419 Scoliosis, unspecified: Secondary | ICD-10-CM | POA: Diagnosis not present

## 2019-03-01 DIAGNOSIS — Z4789 Encounter for other orthopedic aftercare: Secondary | ICD-10-CM | POA: Diagnosis not present

## 2019-03-01 DIAGNOSIS — I10 Essential (primary) hypertension: Secondary | ICD-10-CM | POA: Diagnosis not present

## 2019-03-01 DIAGNOSIS — Z7951 Long term (current) use of inhaled steroids: Secondary | ICD-10-CM | POA: Diagnosis not present

## 2019-03-01 DIAGNOSIS — Z6837 Body mass index (BMI) 37.0-37.9, adult: Secondary | ICD-10-CM | POA: Diagnosis not present

## 2019-03-01 DIAGNOSIS — Z79891 Long term (current) use of opiate analgesic: Secondary | ICD-10-CM | POA: Diagnosis not present

## 2019-03-01 DIAGNOSIS — M4716 Other spondylosis with myelopathy, lumbar region: Secondary | ICD-10-CM | POA: Diagnosis not present

## 2019-03-01 DIAGNOSIS — M4317 Spondylolisthesis, lumbosacral region: Secondary | ICD-10-CM | POA: Diagnosis not present

## 2019-03-08 DIAGNOSIS — Z4789 Encounter for other orthopedic aftercare: Secondary | ICD-10-CM | POA: Diagnosis not present

## 2019-03-16 DIAGNOSIS — G4733 Obstructive sleep apnea (adult) (pediatric): Secondary | ICD-10-CM | POA: Diagnosis not present

## 2019-03-17 DIAGNOSIS — M545 Low back pain: Secondary | ICD-10-CM | POA: Diagnosis not present

## 2019-03-28 DIAGNOSIS — Z23 Encounter for immunization: Secondary | ICD-10-CM | POA: Diagnosis not present

## 2019-04-14 ENCOUNTER — Encounter: Payer: Self-pay | Admitting: Obstetrics and Gynecology

## 2019-04-14 DIAGNOSIS — Z1231 Encounter for screening mammogram for malignant neoplasm of breast: Secondary | ICD-10-CM | POA: Diagnosis not present

## 2019-04-15 DIAGNOSIS — G4733 Obstructive sleep apnea (adult) (pediatric): Secondary | ICD-10-CM | POA: Diagnosis not present

## 2019-04-20 DIAGNOSIS — N6313 Unspecified lump in the right breast, lower outer quadrant: Secondary | ICD-10-CM | POA: Diagnosis not present

## 2019-04-20 DIAGNOSIS — N6325 Unspecified lump in the left breast, overlapping quadrants: Secondary | ICD-10-CM | POA: Diagnosis not present

## 2019-04-20 DIAGNOSIS — N6001 Solitary cyst of right breast: Secondary | ICD-10-CM | POA: Diagnosis not present

## 2019-04-20 DIAGNOSIS — N6320 Unspecified lump in the left breast, unspecified quadrant: Secondary | ICD-10-CM | POA: Diagnosis not present

## 2019-04-20 DIAGNOSIS — N6324 Unspecified lump in the left breast, lower inner quadrant: Secondary | ICD-10-CM | POA: Diagnosis not present

## 2019-04-20 DIAGNOSIS — N6311 Unspecified lump in the right breast, upper outer quadrant: Secondary | ICD-10-CM | POA: Diagnosis not present

## 2019-04-27 ENCOUNTER — Other Ambulatory Visit: Payer: Self-pay | Admitting: Radiology

## 2019-04-27 DIAGNOSIS — N6314 Unspecified lump in the right breast, lower inner quadrant: Secondary | ICD-10-CM | POA: Diagnosis not present

## 2019-04-27 DIAGNOSIS — D242 Benign neoplasm of left breast: Secondary | ICD-10-CM | POA: Diagnosis not present

## 2019-04-27 DIAGNOSIS — N6313 Unspecified lump in the right breast, lower outer quadrant: Secondary | ICD-10-CM | POA: Diagnosis not present

## 2019-04-27 DIAGNOSIS — N6325 Unspecified lump in the left breast, overlapping quadrants: Secondary | ICD-10-CM | POA: Diagnosis not present

## 2019-04-27 DIAGNOSIS — N6011 Diffuse cystic mastopathy of right breast: Secondary | ICD-10-CM | POA: Diagnosis not present

## 2019-05-08 ENCOUNTER — Ambulatory Visit: Payer: Self-pay | Admitting: Surgery

## 2019-05-08 DIAGNOSIS — D249 Benign neoplasm of unspecified breast: Secondary | ICD-10-CM | POA: Diagnosis not present

## 2019-05-08 DIAGNOSIS — D242 Benign neoplasm of left breast: Secondary | ICD-10-CM | POA: Diagnosis not present

## 2019-05-08 DIAGNOSIS — D171 Benign lipomatous neoplasm of skin and subcutaneous tissue of trunk: Secondary | ICD-10-CM | POA: Diagnosis not present

## 2019-05-08 NOTE — H&P (Signed)
[name] Documented: 05/08/2019 11:26 AM Location: [facility] Patient #: [id number] DOB: [birth year] Married / Language: English / Race: White Female  History of Present Illness Theresa Moores A. Theresa Martinezgarcia MD; 05/08/2019 12:16 PM) Patient words: Diagnosis 1. Breast, left, needle core biopsy, 9 o'clock, 7cmfn - INTRADUCTAL PAPILLOMA. - FIBROCYSTIC CHANGE. - NO MALIGNANCY IDENTIFIED. 2. Breast, right, needle core biopsy, 8 o'clock, areolar margin - FIBROCYSTIC CHANGE. - NO MALIGNANCY IDENTIFIED.   Patient presents at request of Dr. Luan Guzman for abnormal mammogram. She underwent recent screening mammogram which showed abnormalities in both breasts. Right breast biopsy showed fibrocystic change. On the left side in the upper quadrant a papilloma was discovered. Patient denies any history of breast pain, nipple discharge or breast mass bilaterally. She's had previous lipoma removed from her left neck and upper back. She thinks THE ONE ON HER neck has recurred. It is not painful or causing any symptoms.  The patient is a 68 year old female.   Allergies [name], [name]; 05/08/2019 11:27 AM) No Known Drug Allergies [07/16/2017]: Allergies Reconciled  Medication History [name], CMA; 05/08/2019 11:28 AM) [name] (8MG  Tablet ER 24HR, Oral) Active. Simvastatin (20MG  Tablet, Oral) Active. Montelukast Sodium (10MG  Tablet, Oral) Active. Losartan Potassium (100MG  Tablet, Oral) Active. Levothyroxine Sodium (100MCG Tablet, Oral) Active. HydroCHLOROthiazide (12.5MG  Capsule, Oral) Active. Gabapentin (300MG  Capsule, Oral) Active. Azelastine HCl (0.1% Solution, Nasal) Active. AmLODIPine Besylate (5MG  Tablet, Oral) Active. Glucosamine (Oral) Specific strength unknown - Active. Fish Oil (Oral) Specific strength unknown - Active. Melatonin (Oral) Specific strength unknown - Active. Flovent (44MCG/ACT Aerosol, Inhalation) Active. Medications Reconciled     Review of  Systems ([name] A. Theresa Fomby MD; 05/08/2019 12:16 PM) All other systems negative  Vitals [name] CMA; 05/08/2019 11:29 AM) 05/08/2019 11:28 AM Weight: 237.4 lb Height: 66in Body Surface Area: 2.15 m Body Mass Index: 38.32 kg/m  Temp.: 59F  Pulse: 99 (Regular)  BP: 140/85 (Sitting, Left Arm, Standard)        Physical Exam ([name] A. Theresa Pantoja MD; 05/08/2019 12:14 PM)  General Mental Status-Alert. General Appearance-Consistent with stated age. Hydration-Well hydrated. Voice-Normal.  Integumentary Note: 6 cm mobile left neck mass underneath previous scar. Consistent with lipoma recurrent  Chest and Lung Exam Note: no SOB or wheezing  Breast Note: mild bruising bilateral breast no masses bilaterally or nipple discharge  Cardiovascular Note: NSR  Neurologic Neurologic evaluation reveals -alert and oriented x 3 with no impairment of recent or remote memory. Mental Status-Normal.  Musculoskeletal Normal Exam - Left-Upper Extremity Strength Normal and Lower Extremity Strength Normal. Normal Exam - Right-Upper Extremity Strength Normal and Lower Extremity Strength Normal.  Lymphatic Head & Neck  General Head & Neck Lymphatics: Bilateral - Description - Normal. Axillary  General Axillary Region: Bilateral - Description - Normal. Tenderness - Non Tender.    Assessment & Plan ([name] A. Theresa Sestak MD; 05/08/2019 12:13 PM)  PAPILLOMA OF BREAST (D24.9) Impression: Discussed left breast needle localized lumpectomy for small but potential risk of upgrade to malignancy. Nonoperative management discussed. She wishes to proceed with left breast seed lumpectomy. Risk of lumpectomy include bleeding, infection, seroma, more surgery, use of seed/wire, wound care, cosmetic deformity and the need for other treatments, death , blood clots, death. Pt agrees to proceed.   PAPILLOMA OF LEFT BREAST (D24.2)  Current Plans You are being scheduled for  surgery- Our schedulers will call you.  You should hear from our office's scheduling department within 5 working days about the location, date, and time of surgery. We  try to make accommodations for patient's preferences in scheduling surgery, but sometimes the OR schedule or the surgeon's schedule prevents [place] from making those accommodations.  If you have not heard from our office [phone]) in 5 working days, call the office and ask for your surgeon's nurse.  If you have other questions about your diagnosis, plan, or surgery, call the office and ask for your surgeon's nurse.  Pt Education - [facility] Breast Biopsy HCI: discussed with patient and provided information.  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 It measures 6 cm. The patient is asymptomatic and will observe

## 2019-05-16 DIAGNOSIS — G4733 Obstructive sleep apnea (adult) (pediatric): Secondary | ICD-10-CM | POA: Diagnosis not present

## 2019-05-17 DIAGNOSIS — M419 Scoliosis, unspecified: Secondary | ICD-10-CM | POA: Diagnosis not present

## 2019-05-17 DIAGNOSIS — M4327 Fusion of spine, lumbosacral region: Secondary | ICD-10-CM | POA: Diagnosis not present

## 2019-05-17 DIAGNOSIS — M4316 Spondylolisthesis, lumbar region: Secondary | ICD-10-CM | POA: Diagnosis not present

## 2019-05-29 ENCOUNTER — Other Ambulatory Visit: Payer: Self-pay | Admitting: Surgery

## 2019-05-29 DIAGNOSIS — D242 Benign neoplasm of left breast: Secondary | ICD-10-CM

## 2019-05-30 DIAGNOSIS — M4327 Fusion of spine, lumbosacral region: Secondary | ICD-10-CM | POA: Diagnosis not present

## 2019-05-31 ENCOUNTER — Other Ambulatory Visit: Payer: Self-pay

## 2019-05-31 ENCOUNTER — Encounter (HOSPITAL_BASED_OUTPATIENT_CLINIC_OR_DEPARTMENT_OTHER): Payer: Self-pay | Admitting: *Deleted

## 2019-06-06 DIAGNOSIS — M4327 Fusion of spine, lumbosacral region: Secondary | ICD-10-CM | POA: Diagnosis not present

## 2019-06-07 ENCOUNTER — Other Ambulatory Visit: Payer: Self-pay

## 2019-06-07 ENCOUNTER — Encounter (HOSPITAL_BASED_OUTPATIENT_CLINIC_OR_DEPARTMENT_OTHER)
Admission: RE | Admit: 2019-06-07 | Discharge: 2019-06-07 | Disposition: A | Discharge status: Home / Self Care | Payer: Medicare HMO | Source: Ambulatory Visit | Attending: Surgery | Admitting: Surgery

## 2019-06-07 DIAGNOSIS — Z01812 Encounter for preprocedural laboratory examination: Secondary | ICD-10-CM | POA: Diagnosis not present

## 2019-06-07 DIAGNOSIS — D242 Benign neoplasm of left breast: Secondary | ICD-10-CM | POA: Insufficient documentation

## 2019-06-07 LAB — CBC WITH DIFFERENTIAL/PLATELET
Abs Immature Granulocytes: 0.03 10*3/uL (ref 0.00–0.07)
Basophils Absolute: 0 10*3/uL (ref 0.0–0.1)
Basophils Relative: 1 %
Eosinophils Absolute: 0.2 10*3/uL (ref 0.0–0.5)
Eosinophils Relative: 3 %
HCT: 40 % (ref 36.0–46.0)
Hemoglobin: 13 g/dL (ref 12.0–15.0)
Immature Granulocytes: 1 %
Lymphocytes Relative: 37 %
Lymphs Abs: 2.2 10*3/uL (ref 0.7–4.0)
MCH: 27.8 pg (ref 26.0–34.0)
MCHC: 32.5 g/dL (ref 30.0–36.0)
MCV: 85.5 fL (ref 80.0–100.0)
Monocytes Absolute: 0.4 10*3/uL (ref 0.1–1.0)
Monocytes Relative: 7 %
Neutro Abs: 3.1 10*3/uL (ref 1.7–7.7)
Neutrophils Relative %: 51 %
Platelets: 249 10*3/uL (ref 150–400)
RBC: 4.68 MIL/uL (ref 3.87–5.11)
RDW: 14.3 % (ref 11.5–15.5)
WBC: 5.8 10*3/uL (ref 4.0–10.5)
nRBC: 0 % (ref 0.0–0.2)

## 2019-06-07 LAB — COMPREHENSIVE METABOLIC PANEL
ALT: 32 U/L (ref 0–44)
AST: 26 U/L (ref 15–41)
Albumin: 3.5 g/dL (ref 3.5–5.0)
Alkaline Phosphatase: 95 U/L (ref 38–126)
Anion gap: 10 (ref 5–15)
BUN: 12 mg/dL (ref 8–23)
CO2: 23 mmol/L (ref 22–32)
Calcium: 9.1 mg/dL (ref 8.9–10.3)
Chloride: 105 mmol/L (ref 98–111)
Creatinine, Ser: 0.67 mg/dL (ref 0.44–1.00)
GFR calc Af Amer: >60 mL/min (ref >60)
GFR calc non Af Amer: >60 mL/min (ref >60)
Glucose, Bld: 165 mg/dL — ABNORMAL HIGH (ref 70–99)
Potassium: 3.9 mmol/L (ref 3.5–5.1)
Sodium: 138 mmol/L (ref 135–145)
Total Bilirubin: 0.6 mg/dL (ref 0.3–1.2)
Total Protein: 6.3 g/dL — ABNORMAL LOW (ref 6.5–8.1)

## 2019-06-07 NOTE — Progress Notes (Signed)

## 2019-06-08 DIAGNOSIS — M4327 Fusion of spine, lumbosacral region: Secondary | ICD-10-CM | POA: Diagnosis not present

## 2019-06-09 ENCOUNTER — Other Ambulatory Visit (HOSPITAL_COMMUNITY)
Admission: RE | Admit: 2019-06-09 | Discharge: 2019-06-09 | Disposition: A | Discharge status: Home / Self Care | Payer: Medicare HMO | Source: Ambulatory Visit | Attending: Surgery | Admitting: Surgery

## 2019-06-09 DIAGNOSIS — Z20828 Contact with and (suspected) exposure to other viral communicable diseases: Secondary | ICD-10-CM | POA: Diagnosis not present

## 2019-06-09 DIAGNOSIS — Z01812 Encounter for preprocedural laboratory examination: Secondary | ICD-10-CM | POA: Insufficient documentation

## 2019-06-09 LAB — SARS CORONAVIRUS 2 (TAT 6-24 HRS): SARS Coronavirus 2: NEGATIVE

## 2019-06-12 ENCOUNTER — Ambulatory Visit
Admission: RE | Admit: 2019-06-12 | Discharge: 2019-06-12 | Disposition: A | Discharge status: Home / Self Care | Payer: Medicare HMO | Source: Ambulatory Visit | Attending: Surgery | Admitting: Surgery

## 2019-06-12 ENCOUNTER — Other Ambulatory Visit: Payer: Self-pay

## 2019-06-12 DIAGNOSIS — D242 Benign neoplasm of left breast: Secondary | ICD-10-CM

## 2019-06-12 NOTE — Anesthesia Preprocedure Evaluation (Addendum)
Anesthesia Evaluation  Patient identified by MRN, date of birth, ID band Patient awake    Reviewed: Allergy & Precautions, NPO status , Patient's Chart, lab work & pertinent test results  Airway Mallampati: II  TM Distance: >3 FB Neck ROM: Full    Dental no notable dental hx. (+) Teeth Intact, Dental Advisory Given   Pulmonary asthma , sleep apnea ,    Pulmonary exam normal breath sounds clear to auscultation       Cardiovascular Exercise Tolerance: Good hypertension, Pt. on medications Normal cardiovascular exam Rhythm:Regular Rate:Normal     Neuro/Psych negative neurological ROS  negative psych ROS   GI/Hepatic Neg liver ROS, GERD  ,  Endo/Other  diabetes, Type 2Hypothyroidism   Renal/GU      Musculoskeletal   Abdominal (+) + obese,   Peds  Hematology   Anesthesia Other Findings   Reproductive/Obstetrics                            Anesthesia Physical Anesthesia Plan  ASA: III  Anesthesia Plan: General   Post-op Pain Management:    Induction: Intravenous  PONV Risk Score and Plan: 4 or greater and Treatment may vary due to age or medical condition, Ondansetron and Midazolam  Airway Management Planned: LMA  Additional Equipment: None  Intra-op Plan:   Post-operative Plan:   Informed Consent: I have reviewed the patients History and Physical, chart, labs and discussed the procedure including the risks, benefits and alternatives for the proposed anesthesia with the patient or authorized representative who has indicated his/her understanding and acceptance.     Dental advisory given  Plan Discussed with:   Anesthesia Plan Comments:        Anesthesia Quick Evaluation

## 2019-06-13 ENCOUNTER — Ambulatory Visit
Admission: RE | Admit: 2019-06-13 | Discharge: 2019-06-13 | Disposition: A | Discharge status: Home / Self Care | Payer: Medicare HMO | Source: Ambulatory Visit | Attending: Surgery | Admitting: Surgery

## 2019-06-13 ENCOUNTER — Ambulatory Visit (HOSPITAL_BASED_OUTPATIENT_CLINIC_OR_DEPARTMENT_OTHER): Payer: Medicare HMO | Admitting: Anesthesiology

## 2019-06-13 ENCOUNTER — Encounter (HOSPITAL_BASED_OUTPATIENT_CLINIC_OR_DEPARTMENT_OTHER)
Admission: RE | Disposition: A | Discharge status: Home / Self Care | Payer: Self-pay | Source: Home / Self Care | Attending: Surgery

## 2019-06-13 ENCOUNTER — Encounter (HOSPITAL_BASED_OUTPATIENT_CLINIC_OR_DEPARTMENT_OTHER): Payer: Self-pay

## 2019-06-13 ENCOUNTER — Ambulatory Visit (HOSPITAL_BASED_OUTPATIENT_CLINIC_OR_DEPARTMENT_OTHER)
Admission: RE | Admit: 2019-06-13 | Discharge: 2019-06-13 | Disposition: A | Discharge status: Home / Self Care | Payer: Medicare HMO | Attending: Surgery | Admitting: Surgery

## 2019-06-13 ENCOUNTER — Other Ambulatory Visit: Payer: Self-pay

## 2019-06-13 DIAGNOSIS — Z79899 Other long term (current) drug therapy: Secondary | ICD-10-CM | POA: Insufficient documentation

## 2019-06-13 DIAGNOSIS — D171 Benign lipomatous neoplasm of skin and subcutaneous tissue of trunk: Secondary | ICD-10-CM | POA: Insufficient documentation

## 2019-06-13 DIAGNOSIS — G4730 Sleep apnea, unspecified: Secondary | ICD-10-CM | POA: Diagnosis not present

## 2019-06-13 DIAGNOSIS — E119 Type 2 diabetes mellitus without complications: Secondary | ICD-10-CM | POA: Insufficient documentation

## 2019-06-13 DIAGNOSIS — J45909 Unspecified asthma, uncomplicated: Secondary | ICD-10-CM | POA: Insufficient documentation

## 2019-06-13 DIAGNOSIS — E039 Hypothyroidism, unspecified: Secondary | ICD-10-CM | POA: Diagnosis not present

## 2019-06-13 DIAGNOSIS — Z6839 Body mass index (BMI) 39.0-39.9, adult: Secondary | ICD-10-CM | POA: Diagnosis not present

## 2019-06-13 DIAGNOSIS — Z7951 Long term (current) use of inhaled steroids: Secondary | ICD-10-CM | POA: Insufficient documentation

## 2019-06-13 DIAGNOSIS — D242 Benign neoplasm of left breast: Secondary | ICD-10-CM | POA: Insufficient documentation

## 2019-06-13 DIAGNOSIS — J4530 Mild persistent asthma, uncomplicated: Secondary | ICD-10-CM | POA: Diagnosis not present

## 2019-06-13 DIAGNOSIS — E669 Obesity, unspecified: Secondary | ICD-10-CM | POA: Insufficient documentation

## 2019-06-13 DIAGNOSIS — I10 Essential (primary) hypertension: Secondary | ICD-10-CM | POA: Diagnosis not present

## 2019-06-13 DIAGNOSIS — N6012 Diffuse cystic mastopathy of left breast: Secondary | ICD-10-CM | POA: Diagnosis not present

## 2019-06-13 DIAGNOSIS — K219 Gastro-esophageal reflux disease without esophagitis: Secondary | ICD-10-CM | POA: Insufficient documentation

## 2019-06-13 HISTORY — PX: BREAST LUMPECTOMY WITH RADIOACTIVE SEED LOCALIZATION: SHX6424

## 2019-06-13 LAB — GLUCOSE, CAPILLARY
Glucose-Capillary: 113 mg/dL — ABNORMAL HIGH (ref 70–99)
Glucose-Capillary: 137 mg/dL — ABNORMAL HIGH (ref 70–99)

## 2019-06-13 SURGERY — BREAST LUMPECTOMY WITH RADIOACTIVE SEED LOCALIZATION
Anesthesia: General | Site: Breast | Laterality: Left

## 2019-06-13 MED ORDER — GABAPENTIN 300 MG PO CAPS
300.0000 mg | ORAL_CAPSULE | ORAL | Status: AC
  Administered 2019-06-13: 300 mg via ORAL

## 2019-06-13 MED ORDER — FENTANYL CITRATE (PF) 100 MCG/2ML IJ SOLN
INTRAMUSCULAR | Status: DC | PRN
  Administered 2019-06-13: 50 ug via INTRAVENOUS
  Administered 2019-06-13 (×2): 25 ug via INTRAVENOUS

## 2019-06-13 MED ORDER — CEFAZOLIN SODIUM-DEXTROSE 2-4 GM/100ML-% IV SOLN
INTRAVENOUS | Status: AC
  Filled 2019-06-13: qty 100

## 2019-06-13 MED ORDER — IBUPROFEN 800 MG PO TABS: 800.0000 mg | ORAL_TABLET | Freq: Three times a day (TID) | ORAL | 0 refills | Status: DC | PRN

## 2019-06-13 MED ORDER — FENTANYL CITRATE (PF) 100 MCG/2ML IJ SOLN
INTRAMUSCULAR | Status: AC
  Filled 2019-06-13: qty 2

## 2019-06-13 MED ORDER — ACETAMINOPHEN 10 MG/ML IV SOLN: 1000.0000 mg | Freq: Once | INTRAVENOUS | Status: DC | PRN

## 2019-06-13 MED ORDER — CEFAZOLIN SODIUM-DEXTROSE 2-3 GM-%(50ML) IV SOLR
INTRAVENOUS | Status: DC | PRN
  Administered 2019-06-13: 2 g via INTRAVENOUS

## 2019-06-13 MED ORDER — CHLORHEXIDINE GLUCONATE CLOTH 2 % EX PADS: 6.0000 | MEDICATED_PAD | Freq: Once | CUTANEOUS | Status: DC

## 2019-06-13 MED ORDER — CELECOXIB 200 MG PO CAPS
200.0000 mg | ORAL_CAPSULE | ORAL | Status: AC
  Administered 2019-06-13: 200 mg via ORAL

## 2019-06-13 MED ORDER — ACETAMINOPHEN 500 MG PO TABS
ORAL_TABLET | ORAL | Status: AC
  Filled 2019-06-13: qty 2

## 2019-06-13 MED ORDER — MIDAZOLAM HCL 2 MG/2ML IJ SOLN
INTRAMUSCULAR | Status: AC
  Filled 2019-06-13: qty 2

## 2019-06-13 MED ORDER — ONDANSETRON HCL 4 MG/2ML IJ SOLN: 4.0000 mg | Freq: Once | INTRAMUSCULAR | Status: DC | PRN

## 2019-06-13 MED ORDER — ACETAMINOPHEN 500 MG PO TABS
1000.0000 mg | ORAL_TABLET | ORAL | Status: AC
  Administered 2019-06-13: 1000 mg via ORAL

## 2019-06-13 MED ORDER — GABAPENTIN 300 MG PO CAPS
ORAL_CAPSULE | ORAL | Status: AC
  Filled 2019-06-13: qty 1

## 2019-06-13 MED ORDER — HYDROCODONE-ACETAMINOPHEN 5-325 MG PO TABS
1.0000 | ORAL_TABLET | Freq: Once | ORAL | Status: AC
  Administered 2019-06-13: 1 via ORAL

## 2019-06-13 MED ORDER — CELECOXIB 200 MG PO CAPS
ORAL_CAPSULE | ORAL | Status: AC
  Filled 2019-06-13: qty 1

## 2019-06-13 MED ORDER — FENTANYL CITRATE (PF) 100 MCG/2ML IJ SOLN: 25.0000 ug | INTRAMUSCULAR | Status: DC | PRN

## 2019-06-13 MED ORDER — HYDROCODONE-ACETAMINOPHEN 5-325 MG PO TABS: 1.0000 | ORAL_TABLET | Freq: Four times a day (QID) | ORAL | 0 refills | Status: DC | PRN

## 2019-06-13 MED ORDER — ONDANSETRON HCL 4 MG/2ML IJ SOLN
INTRAMUSCULAR | Status: AC
  Filled 2019-06-13: qty 2

## 2019-06-13 MED ORDER — LIDOCAINE 2% (20 MG/ML) 5 ML SYRINGE
INTRAMUSCULAR | Status: AC
  Filled 2019-06-13: qty 5

## 2019-06-13 MED ORDER — DEXTROSE 5 % IV SOLN: 3.0000 g | INTRAVENOUS | Status: DC

## 2019-06-13 MED ORDER — LACTATED RINGERS IV SOLN
INTRAVENOUS | Status: DC
  Administered 2019-06-13: 10:00:00 via INTRAVENOUS

## 2019-06-13 MED ORDER — PROPOFOL 10 MG/ML IV BOLUS
INTRAVENOUS | Status: DC | PRN
  Administered 2019-06-13: 120 mg via INTRAVENOUS

## 2019-06-13 MED ORDER — DEXAMETHASONE SODIUM PHOSPHATE 10 MG/ML IJ SOLN
INTRAMUSCULAR | Status: AC
  Filled 2019-06-13: qty 1

## 2019-06-13 MED ORDER — MIDAZOLAM HCL 2 MG/2ML IJ SOLN
INTRAMUSCULAR | Status: DC | PRN
  Administered 2019-06-13: 2 mg via INTRAVENOUS

## 2019-06-13 MED ORDER — DEXAMETHASONE SODIUM PHOSPHATE 10 MG/ML IJ SOLN
INTRAMUSCULAR | Status: DC | PRN
  Administered 2019-06-13: 4 mg via INTRAVENOUS

## 2019-06-13 MED ORDER — LIDOCAINE HCL (CARDIAC) PF 100 MG/5ML IV SOSY
PREFILLED_SYRINGE | INTRAVENOUS | Status: DC | PRN
  Administered 2019-06-13: 100 mg via INTRAVENOUS

## 2019-06-13 MED ORDER — HYDROCODONE-ACETAMINOPHEN 5-325 MG PO TABS
ORAL_TABLET | ORAL | Status: AC
  Filled 2019-06-13: qty 1

## 2019-06-13 MED ORDER — PROPOFOL 10 MG/ML IV BOLUS
INTRAVENOUS | Status: AC
  Filled 2019-06-13: qty 20

## 2019-06-13 MED ORDER — BUPIVACAINE HCL (PF) 0.25 % IJ SOLN
INTRAMUSCULAR | Status: DC | PRN
  Administered 2019-06-13: 20 mL

## 2019-06-13 MED ORDER — ONDANSETRON HCL 4 MG/2ML IJ SOLN
INTRAMUSCULAR | Status: DC | PRN
  Administered 2019-06-13: 4 mg via INTRAVENOUS

## 2019-06-13 SURGICAL SUPPLY — 49 items
ADH SKN CLS APL DERMABOND .7 (GAUZE/BANDAGES/DRESSINGS) ×1
APL PRP STRL LF DISP 70% ISPRP (MISCELLANEOUS) ×1
APPLIER CLIP 9.375 MED OPEN (MISCELLANEOUS)
APR CLP MED 9.3 20 MLT OPN (MISCELLANEOUS)
BINDER BREAST XLRG (GAUZE/BANDAGES/DRESSINGS) IMPLANT
BINDER BREAST XXLRG (GAUZE/BANDAGES/DRESSINGS) ×1 IMPLANT
BLADE SURG 15 STRL LF DISP TIS (BLADE) ×1 IMPLANT
BLADE SURG 15 STRL SS (BLADE) ×2
CANISTER SUC SOCK COL 7IN (MISCELLANEOUS) IMPLANT
CANISTER SUCT 1200ML W/VALVE (MISCELLANEOUS) IMPLANT
CHLORAPREP W/TINT 26 (MISCELLANEOUS) ×2 IMPLANT
CLIP APPLIE 9.375 MED OPEN (MISCELLANEOUS) IMPLANT
COVER BACK TABLE REUSABLE LG (DRAPES) ×2 IMPLANT
COVER MAYO STAND REUSABLE (DRAPES) ×2 IMPLANT
COVER PROBE W GEL 5X96 (DRAPES) ×2 IMPLANT
DECANTER SPIKE VIAL GLASS SM (MISCELLANEOUS) IMPLANT
DERMABOND ADVANCED (GAUZE/BANDAGES/DRESSINGS) ×1
DERMABOND ADVANCED .7 DNX12 (GAUZE/BANDAGES/DRESSINGS) ×1 IMPLANT
DRAPE LAPAROTOMY 100X72 PEDS (DRAPES) ×2 IMPLANT
DRAPE UTILITY XL STRL (DRAPES) ×2 IMPLANT
ELECT COATED BLADE 2.86 ST (ELECTRODE) ×2 IMPLANT
ELECT REM PT RETURN 9FT ADLT (ELECTROSURGICAL) ×2
ELECTRODE REM PT RTRN 9FT ADLT (ELECTROSURGICAL) ×1 IMPLANT
GLOVE BIO SURGEON STRL SZ 6.5 (GLOVE) ×1 IMPLANT
GLOVE BIOGEL PI IND STRL 7.0 (GLOVE) IMPLANT
GLOVE BIOGEL PI IND STRL 8 (GLOVE) ×1 IMPLANT
GLOVE BIOGEL PI INDICATOR 7.0 (GLOVE) ×1
GLOVE BIOGEL PI INDICATOR 8 (GLOVE) ×1
GLOVE ECLIPSE 8.0 STRL XLNG CF (GLOVE) ×2 IMPLANT
GOWN STRL REUS W/ TWL LRG LVL3 (GOWN DISPOSABLE) ×2 IMPLANT
GOWN STRL REUS W/TWL LRG LVL3 (GOWN DISPOSABLE) ×4
HEMOSTAT ARISTA ABSORB 3G PWDR (HEMOSTASIS) IMPLANT
HEMOSTAT SNOW SURGICEL 2X4 (HEMOSTASIS) IMPLANT
KIT MARKER MARGIN INK (KITS) ×2 IMPLANT
NDL HYPO 25X1 1.5 SAFETY (NEEDLE) ×1 IMPLANT
NEEDLE HYPO 25X1 1.5 SAFETY (NEEDLE) ×2 IMPLANT
NS IRRIG 1000ML POUR BTL (IV SOLUTION) ×2 IMPLANT
PACK BASIN DAY SURGERY FS (CUSTOM PROCEDURE TRAY) ×2 IMPLANT
PENCIL SMOKE EVACUATOR (MISCELLANEOUS) ×2 IMPLANT
SLEEVE SCD COMPRESS KNEE MED (MISCELLANEOUS) ×2 IMPLANT
SPONGE LAP 4X18 RFD (DISPOSABLE) ×2 IMPLANT
SUT MNCRL AB 4-0 PS2 18 (SUTURE) ×2 IMPLANT
SUT SILK 2 0 SH (SUTURE) IMPLANT
SUT VICRYL 3-0 CR8 SH (SUTURE) ×2 IMPLANT
SYR CONTROL 10ML LL (SYRINGE) ×2 IMPLANT
TOWEL GREEN STERILE FF (TOWEL DISPOSABLE) ×2 IMPLANT
TRAY FAXITRON CT DISP (TRAY / TRAY PROCEDURE) ×2 IMPLANT
TUBE CONNECTING 20X1/4 (TUBING) IMPLANT
YANKAUER SUCT BULB TIP NO VENT (SUCTIONS) IMPLANT

## 2019-06-13 NOTE — Anesthesia Postprocedure Evaluation (Signed)
Anesthesia Post Note  Patient: [name]  Procedure(s) Performed: LEFT BREAST LUMPECTOMY WITH RADIOACTIVE SEED LOCALIZATION (Left Breast)     Patient location during evaluation: PACU Anesthesia Type: General Level of consciousness: awake and alert Pain management: pain level controlled Vital Signs Assessment: post-procedure vital signs reviewed and stable Respiratory status: spontaneous breathing, nonlabored ventilation, respiratory function stable and patient connected to nasal cannula oxygen Cardiovascular status: blood pressure returned to baseline and stable Postop Assessment: no apparent nausea or vomiting Anesthetic complications: no    Last Vitals:  Vitals:   06/13/19 1130 06/13/19 1145  BP: 123/66 134/69  Pulse: 77 70  Resp: 15 15  Temp:    SpO2: 92% 96%    Last Pain:  Vitals:   06/13/19 1130  TempSrc:   PainSc: 1                  Barnet Glasgow

## 2019-06-13 NOTE — H&P (Signed)
[name] Documented: Location: [facility] Patient #: [id number] DOB: 1951-03-13 Married / Language: English / Race: White Female  History of Present Illness  Patient words: Diagnosis 1. Breast, left, needle core biopsy, 9 o'clock, 7cmfn - INTRADUCTAL PAPILLOMA. - FIBROCYSTIC CHANGE. - NO MALIGNANCY IDENTIFIED. 2. Breast, right, needle core biopsy, 8 o'clock, areolar margin - FIBROCYSTIC CHANGE. - NO MALIGNANCY IDENTIFIED.   Patient presents at request of Dr. Luan Pulling for abnormal mammogram. She underwent recent screening mammogram which showed abnormalities in both breasts. Right breast biopsy showed fibrocystic change. On the left side in the upper quadrant a papilloma was discovered. Patient denies any history of breast pain, nipple discharge or breast mass bilaterally. She's had previous lipoma removed from her left neck and upper back. She thinks THE ONE ON HER neck has recurred. It is not painful or causing any symptoms.  The patient is a 68 year old female.   Allergies  No Known Drug Allergies [07/16/2017]: Allergies Reconciled  Medication History  Toviaz (8MG  Tablet ER 24HR, Oral) Active. Simvastatin (20MG  Tablet, Oral) Active. Montelukast Sodium (10MG  Tablet, Oral) Active. Losartan Potassium (100MG  Tablet, Oral) Active. Levothyroxine Sodium (100MCG Tablet, Oral) Active. HydroCHLOROthiazide (12.5MG  Capsule, Oral) Active. Gabapentin (300MG  Capsule, Oral) Active. Azelastine HCl (0.1% Solution, Nasal) Active. AmLODIPine Besylate (5MG  Tablet, Oral) Active. Glucosamine (Oral) Specific strength unknown - Active. Fish Oil (Oral) Specific strength unknown - Active. Melatonin (Oral) Specific strength unknown - Active. Flovent (44MCG/ACT Aerosol, Inhalation) Active. Medications Reconciled     Review of Systems  All other systems negative  Vitals  05/08/2019 11:28 AM Weight: 237.4 lb Height: 66in Body Surface Area:  2.15 m Body Mass Index: 38.32 kg/m  Temp.: 51F  Pulse: 99 (Regular)  BP: 140/85 (Sitting, Left Arm, Standard)        Physical Exam   General Mental Status-Alert. General Appearance-Consistent with stated age. Hydration-Well hydrated. Voice-Normal.  Integumentary Note: 6 cm mobile left neck mass underneath previous scar. Consistent with lipoma recurrent  Chest and Lung Exam Note: no SOB or wheezing  Breast Note: mild bruising bilateral breast no masses bilaterally or nipple discharge  Cardiovascular Note: NSR  Neurologic Neurologic evaluation reveals -alert and oriented x 3 with no impairment of recent or remote memory. Mental Status-Normal.  Musculoskeletal Normal Exam - Left-Upper Extremity Strength Normal and Lower Extremity Strength Normal. Normal Exam - Right-Upper Extremity Strength Normal and Lower Extremity Strength Normal.  Lymphatic Head & Neck  General Head & Neck Lymphatics: Bilateral - Description - Normal. Axillary  General Axillary Region: Bilateral - Description - Normal. Tenderness - Non Tender.    Assessment & Plan   PAPILLOMA OF BREAST (D24.9) Impression: Discussed left breast needle localized lumpectomy for small but potential risk of upgrade to malignancy. Nonoperative management discussed. She wishes to proceed with left breast seed lumpectomy. Risk of lumpectomy include bleeding, infection, seroma, more surgery, use of seed/wire, wound care, cosmetic deformity and the need for other treatments, death , blood clots, death. Pt agrees to proceed.   PAPILLOMA OF LEFT BREAST (D24.2)  Current Plans You are being scheduled for surgery- Our schedulers will call you.  You should hear from our office's scheduling department within 5 working days about the location, date, and time of surgery. We try to make accommodations for patient's preferences in scheduling surgery, but sometimes the  OR schedule or the surgeon's schedule prevents [place] from making those accommodations.  If you have not heard from our office [phone]) in 5 working days, call the office  and ask for your surgeon's nurse.  If you have other questions about your diagnosis, plan, or surgery, call the office and ask for your surgeon's nurse.  Pt Education - [facility] Breast Biopsy HCI: discussed with patient and provided information.  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 It measures 6 cm. The patient is asymptomatic and will observe

## 2019-06-13 NOTE — Discharge Instructions (Signed)
[employer] Office Phone Number [phone]  BREAST BIOPSY/ PARTIAL MASTECTOMY: POST OP INSTRUCTIONS  Always review your discharge instruction sheet given to you by the facility where your surgery was performed.  IF YOU HAVE DISABILITY OR FAMILY LEAVE FORMS, YOU MUST BRING THEM TO THE OFFICE FOR PROCESSING.  DO NOT GIVE THEM TO YOUR DOCTOR.  1. A prescription for pain medication may be given to you upon discharge.  Take your pain medication as prescribed, if needed.  If narcotic pain medicine is not needed, then you may take acetaminophen (Tylenol) or ibuprofen (Advil) as needed. No Tylenol until 4pm, no ibuprofen until 6pm 2. Take your usually prescribed medications unless otherwise directed 3. If you need a refill on your pain medication, please contact your pharmacy.  They will contact our office to request authorization.  Prescriptions will not be filled after 5pm or on week-ends. 4. You should eat very light the first 24 hours after surgery, such as soup, crackers, pudding, etc.  Resume your normal diet the day after surgery. 5. Most patients will experience some swelling and bruising in the breast.  Ice packs and a good support bra will help.  Swelling and bruising can take several days to resolve.  6. It is common to experience some constipation if taking pain medication after surgery.  Increasing fluid intake and taking a stool softener will usually help or prevent this problem from occurring.  A mild laxative (Milk of Magnesia or Miralax) should be taken according to package directions if there are no bowel movements after 48 hours. 7. Unless discharge instructions indicate otherwise, you may remove your bandages 24-48 hours after surgery, and you may shower at that time.  You may have steri-strips (small skin tapes) in place directly over the incision.  These strips should be left on the skin for 7-10 days.  If your surgeon used skin glue on the incision, you may shower in  24 hours.  The glue will flake off over the next 2-3 weeks.  Any sutures or staples will be removed at the office during your follow-up visit. 8. ACTIVITIES:  You may resume regular daily activities (gradually increasing) beginning the next day.  Wearing a good support bra or sports bra minimizes pain and swelling.  You may have sexual intercourse when it is comfortable. a. You may drive when you no longer are taking prescription pain medication, you can comfortably wear a seatbelt, and you can safely maneuver your car and apply brakes. b. RETURN TO WORK:  ______________________________________________________________________________________ 9. You should see your doctor in the office for a follow-up appointment approximately two weeks after your surgery.  Your doctors nurse will typically make your follow-up appointment when she calls you with your pathology report.  Expect your pathology report 2-3 business days after your surgery.  You may call to check if you do not hear from [place] after three days. 10. OTHER INSTRUCTIONS: _______________________________________________________________________________________________ _____________________________________________________________________________________________________________________________________ _____________________________________________________________________________________________________________________________________ _____________________________________________________________________________________________________________________________________  WHEN TO CALL YOUR DOCTOR: 1. Fever over 101.0 2. Nausea and/or vomiting. 3. Extreme swelling or bruising. 4. Continued bleeding from incision. 5. Increased pain, redness, or drainage from the incision.  The clinic staff is available to answer your questions during regular business hours.  Please dont hesitate to call and ask to speak to one of the nurses for clinical concerns.  If you have  a medical emergency, go to the nearest emergency room or call 911.  A surgeon from [facility] is always on call at the hospital.  For further questions, please visit [email or web address]     Post Anesthesia Home Care Instructions  Activity: Get plenty of rest for the remainder of the day. A responsible individual must stay with you for 24 hours following the procedure.  For the next 24 hours, DO NOT: -Drive a car -Paediatric nurse -Drink alcoholic beverages -Take any medication unless instructed by your physician -Make any legal decisions or sign important papers.  Meals: Start with liquid foods such as gelatin or soup. Progress to regular foods as tolerated. Avoid greasy, spicy, heavy foods. If nausea and/or vomiting occur, drink only clear liquids until the nausea and/or vomiting subsides. Call your physician if vomiting continues.  Special Instructions/Symptoms: Your throat may feel dry or sore from the anesthesia or the breathing tube placed in your throat during surgery. If this causes discomfort, gargle with warm salt water. The discomfort should disappear within 24 hours.  If you had a scopolamine patch placed behind your ear for the management of post- operative nausea and/or vomiting:  1. The medication in the patch is effective for 72 hours, after which it should be removed.  Wrap patch in a tissue and discard in the trash. Wash hands thoroughly with soap and water. 2. You may remove the patch earlier than 72 hours if you experience unpleasant side effects which may include dry mouth, dizziness or visual disturbances. 3. Avoid touching the patch. Wash your hands with soap and water after contact with the patch.

## 2019-06-13 NOTE — Transfer of Care (Signed)
Immediate Anesthesia Transfer of Care Note  Patient: [name]  Procedure(s) Performed: LEFT BREAST LUMPECTOMY WITH RADIOACTIVE SEED LOCALIZATION (Left Breast)  Patient Location: PACU  Anesthesia Type:General  Level of Consciousness: awake, alert , oriented, drowsy and patient cooperative  Airway & Oxygen Therapy: Patient Spontanous Breathing and Patient connected to nasal cannula oxygen  Post-op Assessment: Report given to RN and Post -op Vital signs reviewed and stable  Post vital signs: Reviewed and stable  Last Vitals:  Vitals Value Taken Time  BP 126/66 06/13/19 1108  Temp    Pulse 85 06/13/19 1109  Resp 23 06/13/19 1109  SpO2 97 % 06/13/19 1109  Vitals shown include unvalidated device data.  Last Pain:  Vitals:   06/13/19 1011  TempSrc: Temporal  PainSc: 0-No pain         Complications: No apparent anesthesia complications

## 2019-06-13 NOTE — Anesthesia Procedure Notes (Signed)
Procedure Name: LMA Insertion Date/Time: 06/13/2019 10:35 AM Performed by: Raenette Rover, CRNA Pre-anesthesia Checklist: Patient identified, Emergency Drugs available, Suction available and Patient being monitored Patient Re-evaluated:Patient Re-evaluated prior to induction Oxygen Delivery Method: Circle system utilized Preoxygenation: Pre-oxygenation with 100% oxygen Induction Type: IV induction LMA: LMA inserted LMA Size: 4.0 Number of attempts: 1 Placement Confirmation: positive ETCO2 and breath sounds checked- equal and bilateral Tube secured with: Tape Dental Injury: Teeth and Oropharynx as per pre-operative assessment

## 2019-06-13 NOTE — Interval H&P Note (Signed)
History and Physical Interval Note:  06/13/2019 10:12 AM  [name]  has presented today for surgery, with the diagnosis of PAPILLOMA.  The various methods of treatment have been discussed with the patient and family. After consideration of risks, benefits and other options for treatment, the patient has consented to  Procedure(s): LEFT BREAST LUMPECTOMY WITH RADIOACTIVE SEED LOCALIZATION (Left) as a surgical intervention.  The patient's history has been reviewed, patient examined, no change in status, stable for surgery.  I have reviewed the patient's chart and labs.  Questions were answered to the patient's satisfaction.     [place]

## 2019-06-13 NOTE — Op Note (Signed)
Preoperative diagnosis: Left breast papilloma medial  Postoperative diagnosis: Same  Procedure: Left breast seed localized lumpectomy  Surgeon: Erroll Luna, MD  Anesthesia: General with local  EBL: 10 cc  Specimen: Left breast tissue with seed and clip verified by Faxitron  Drains: None  IV fluids: Per anesthesia record  Indications for procedure: The patient presents for left breast lumpectomy secondary to core biopsy of a left breast medial lesion which turned out to be a papilloma.  We discussed the pros and cons of excision of papilloma and potential upgrade risk to malignancy.  She agreed to proceed with left breast lumpectomy.The procedure has been discussed with the patient. Alternatives to surgery have been discussed with the patient.  Risks of surgery include bleeding,  Infection,  Seroma formation, death,  and the need for further surgery.   The patient understands and wishes to proceed.   Description of procedure: The patient was met in the holding area and questions were answered.  The neoprobe was used to verify seed location left medial breast.  She was then taken back to the operative room.  She is placed supine upon the OR table.  After induction of general anesthesia, left breast was prepped and draped in sterile fashion.  Timeout was performed.  Neoprobe used to verify seed location in the medial left breast.  Transverse incision was made over the signal after injection of local anesthetic and all tissue around the seed and clip were excised with a grossly negative margin.  Faxitron image revealed seed and clip to be in the specimen.  The cavities made hemostatic with cautery then closed with 3-0 Vicryl and 4-0 Monocryl.  Local anesthetic was infiltrated around the cavity walls.  All final counts were found to be correct.  Dermabond applied.  The patient was awoke, extubated and  taken to recovery in satisfactory condition.

## 2019-06-14 ENCOUNTER — Encounter (HOSPITAL_BASED_OUTPATIENT_CLINIC_OR_DEPARTMENT_OTHER): Payer: Self-pay | Admitting: Surgery

## 2019-06-14 LAB — SURGICAL PATHOLOGY

## 2019-06-15 ENCOUNTER — Ambulatory Visit: Payer: Medicare HMO | Admitting: Allergy

## 2019-06-15 ENCOUNTER — Encounter: Payer: Self-pay | Admitting: Allergy

## 2019-06-15 ENCOUNTER — Other Ambulatory Visit: Payer: Self-pay

## 2019-06-15 VITALS — BP 150/86 | HR 88 | Temp 98.6°F | Resp 16 | Ht 65.5 in | Wt 237.2 lb

## 2019-06-15 DIAGNOSIS — J310 Chronic rhinitis: Secondary | ICD-10-CM | POA: Diagnosis not present

## 2019-06-15 DIAGNOSIS — J4530 Mild persistent asthma, uncomplicated: Secondary | ICD-10-CM

## 2019-06-15 NOTE — Progress Notes (Signed)
Follow-up Note  RE: LYRAH MCPEEK MRN: [id number] DOB: 1951/05/14 Date of Office Visit: 06/15/2019   History of present illness: [name] is a 68 y.o. female presenting today for follow-up of asthma and mixed rhinitis.  She was last seen in the office on 12/14/2018 by myself.  Since this visit she did have back surgery in August that went well.  She also this week had a breast lump removed that she reports is a noncancerous papilloma.  She states with both of these procedures and with anesthesia that she did very well.  She continues to wear a back brace. In regards to her asthma she states she has been doing well takes her Flovent 110 mcg 2 puffs in the morning.  She also does singular daily.  She has rare use of her albuterol.  She denies any nighttime awakenings.  She has not had any ED or urgent care visits or any systemic steroid needs. With her mixed rhinitis she states she is doing well with the Singulair as above and she does have both Flonase and Astelin that she will use to help with control symptoms. She has had her flu vaccine.   Review of systems: Review of Systems  Constitutional: Negative.   HENT: Negative.   Eyes: Negative.   Respiratory: Negative.   Cardiovascular: Negative.   Gastrointestinal: Negative.   Musculoskeletal: Negative.   Skin: Negative.   Neurological: Negative.     All other systems negative unless noted above in HPI  Past medical/social/surgical/family history have been reviewed and are unchanged unless specifically indicated below.  No changes  Medication List: Current Outpatient Medications  Medication Sig Dispense Refill  . 5-Hydroxytryptophan (5-HTP MAXIMUM STRENGTH) 200 MG CAPS Take 200 mg by mouth daily.    Marland Kitchen albuterol (PROAIR HFA) 108 (90 Base) MCG/ACT inhaler Inhale 2 puffs into the lungs every 4 (four) hours as needed for wheezing or shortness of breath. 1 Inhaler 3  . amLODipine (NORVASC) 5 MG tablet Take 5 mg by mouth daily.     Marland Kitchen azelastine (ASTELIN) 0.1 % nasal spray Place 2 sprays into both nostrils 2 (two) times daily. Use in each nostril as directed 60 mL 5  . baclofen (LIORESAL) 20 MG tablet Take 20 mg by mouth 3 (three) times daily as needed.    Marland Kitchen CINNAMON PO 2 capsules Orally once a day    . famotidine-calcium carbonate-magnesium hydroxide (PEPCID COMPLETE) 10-800-165 MG chewable tablet 1 tablet Orally Twice a day    . fluticasone (FLOVENT HFA) 110 MCG/ACT inhaler Inhale 2 puffs into the lungs 2 (two) times daily. 1 Inhaler 5  . gabapentin (NEURONTIN) 300 MG capsule Take 600 mg by mouth 3 (three) times daily.     Marland Kitchen GLUCOSAMINE-CHONDROITIN PO Take 1 tablet by mouth daily. Move Free Ultra Triple Action    . glucose blood test strip as directed finger stick once a day for 90 days    . hydrochlorothiazide (MICROZIDE) 12.5 MG capsule Take 12.5 mg by mouth daily.    Marland Kitchen HYDROcodone-acetaminophen (NORCO/VICODIN) 5-325 MG tablet Take 1 tablet by mouth every 6 (six) hours as needed for moderate pain. 15 tablet 0  . hydrocortisone-pramoxine (PROCTOFOAM HC) rectal foam 1 application as needed Rectal Four times a day if needed for hemorrhoid for 7 days    . ibuprofen (ADVIL) 800 MG tablet Take 1 tablet (800 mg total) by mouth every 8 (eight) hours as needed. 30 tablet 0  . levothyroxine (SYNTHROID, LEVOTHROID) 100 MCG  tablet     . losartan (COZAAR) 100 MG tablet Take 100 mg by mouth daily.    . Melatonin 3 MG TABS Take 3 mg by mouth as needed.    . metFORMIN (GLUCOPHAGE-XR) 500 MG 24 hr tablet 1 tablet with evening meal Orally Once a day for 90 days    . montelukast (SINGULAIR) 10 MG tablet Take 10 mg by mouth at bedtime.    . Multiple Vitamin (MULTIVITAMIN WITH MINERALS) TABS tablet Take 1 tablet by mouth at bedtime.    . Omega-3 Fatty Acids (FISH OIL) 1200 MG CAPS Take 1,200 mg by mouth daily.    . simvastatin (ZOCOR) 20 MG tablet Take 20 mg by mouth at bedtime.     . trospium (SANCTURA) 20 MG tablet Take 20 mg by mouth at  bedtime.    . Turmeric 500 MG CAPS 2 capsules Orally once a day    . zolpidem (AMBIEN) 10 MG tablet Take 5 mg by mouth at bedtime as needed for sleep.      No current facility-administered medications for this visit.     Known medication allergies: Allergies  Allergen Reactions  . Oxycodone-Acetaminophen Nausea Only     Physical examination: Blood pressure (!) 150/86, pulse 88, temperature 98.6 F (37 C), temperature source Temporal, resp. rate 16, height 5' 5.5" (1.664 m), weight 237 lb 3.2 oz (107.6 kg), last menstrual period 07/06/1998, SpO2 97 %.  General: Alert, interactive, in no acute distress, wearing back brace. HEENT: PERRLA, TMs pearly gray, turbinates non-edematous without discharge, post-pharynx non erythematous. Neck: Supple without lymphadenopathy. Lungs: Clear to auscultation without wheezing, rhonchi or rales. {no increased work of breathing. CV: Normal S1, S2 without murmurs. Abdomen: Nondistended, nontender. Skin: Warm and dry, without lesions or rashes. Extremities:  No clubbing, cyanosis or edema. Neuro:   Grossly intact.  Diagnositics/Labs: None today  Assessment and plan:   Asthma, mild persistent  - Continue Flovent 110 g  2 puffs daily with a spacer to prevent cough or wheeze.   If having an asthma flare or respiratory illness increase to twice a day dosing.  - Continue Singulair 10 mg daily  - have access to albuterol inhaler 2 puffs every 4-6 hours as needed for cough/wheeze/shortness of breath/chest tightness.  May use 15-20 minutes prior to activity.   Monitor frequency of use.    Asthma control goals:   Full participation in all desired activities (may need albuterol before activity)  Albuterol use two time or less a week on average (not counting use with activity)  Cough interfering with sleep two time or less a month  Oral steroids no more than once a year  No hospitalizations   Mixed rhinitis  - Continue Singulair as above  -  Continue Flonase 1-2 sprays each nostril daily as needed for congestion.  Use for 1-2 weeks at a time before stopping once symptoms improved  - Continue Astelin 1-2 sprays twice a day for nasal drainage/post-nasal drip  Follow-up in 6 months or sooner if needed.  I appreciate the opportunity to take part in Me[place]'s care. Please do not hesitate to contact me with questions.  Sincerely,   Prudy Feeler, MD Allergy/Immunology [facility]

## 2019-06-15 NOTE — Patient Instructions (Addendum)
Asthma  - Continue Flovent 110 g  2 puffs daily with a spacer to prevent cough or wheeze.   If having an asthma flare or respiratory illness increase to twice a day dosing.  - Continue Singulair 10 mg daily  - have access to albuterol inhaler 2 puffs every 4-6 hours as needed for cough/wheeze/shortness of breath/chest tightness.  May use 15-20 minutes prior to activity.   Monitor frequency of use.    Asthma control goals:   Full participation in all desired activities (may need albuterol before activity)  Albuterol use two time or less a week on average (not counting use with activity)  Cough interfering with sleep two time or less a month  Oral steroids no more than once a year  No hospitalizations   Mixed rhinitis  - Continue Singulair as above  - Continue Flonase 1-2 sprays each nostril daily as needed for congestion.  Use for 1-2 weeks at a time before stopping once symptoms improved  - Continue Astelin 1-2 sprays twice a day for nasal drainage/post-nasal drip  Follow-up in 6 months or sooner if needed.

## 2019-06-22 DIAGNOSIS — M4327 Fusion of spine, lumbosacral region: Secondary | ICD-10-CM | POA: Diagnosis not present

## 2019-06-27 DIAGNOSIS — M4327 Fusion of spine, lumbosacral region: Secondary | ICD-10-CM | POA: Diagnosis not present

## 2019-07-03 DIAGNOSIS — M4327 Fusion of spine, lumbosacral region: Secondary | ICD-10-CM | POA: Diagnosis not present

## 2019-07-06 DIAGNOSIS — M4327 Fusion of spine, lumbosacral region: Secondary | ICD-10-CM | POA: Diagnosis not present

## 2019-07-10 ENCOUNTER — Other Ambulatory Visit: Payer: Self-pay

## 2019-07-11 DIAGNOSIS — M4327 Fusion of spine, lumbosacral region: Secondary | ICD-10-CM | POA: Diagnosis not present

## 2019-07-12 ENCOUNTER — Encounter: Payer: Self-pay | Admitting: Obstetrics and Gynecology

## 2019-07-12 ENCOUNTER — Ambulatory Visit (INDEPENDENT_AMBULATORY_CARE_PROVIDER_SITE_OTHER): Payer: Medicare HMO | Admitting: Obstetrics and Gynecology

## 2019-07-12 ENCOUNTER — Other Ambulatory Visit: Payer: Self-pay

## 2019-07-12 VITALS — BP 142/72 | HR 100 | Temp 97.2°F | Resp 22 | Ht 66.0 in | Wt 233.0 lb

## 2019-07-12 DIAGNOSIS — N952 Postmenopausal atrophic vaginitis: Secondary | ICD-10-CM

## 2019-07-12 DIAGNOSIS — Z01419 Encounter for gynecological examination (general) (routine) without abnormal findings: Secondary | ICD-10-CM | POA: Diagnosis not present

## 2019-07-12 MED ORDER — ESTRADIOL 0.1 MG/GM VA CREA: TOPICAL_CREAM | VAGINAL | 1 refills | Status: DC

## 2019-07-12 NOTE — Patient Instructions (Signed)
EXERCISE AND DIET:  We recommended that you start or continue a regular exercise program for good health. Regular exercise means any activity that makes your heart beat faster and makes you sweat.  We recommend exercising at least 30 minutes per day at least 3 days a week, preferably 4 or 5.  We also recommend a diet low in fat and sugar.  Inactivity, poor dietary choices and obesity can cause diabetes, heart attack, stroke, and kidney damage, among others.    ALCOHOL AND SMOKING:  Women should limit their alcohol intake to no more than 7 drinks/beers/glasses of wine (combined, not each!) per week. Moderation of alcohol intake to this level decreases your risk of breast cancer and liver damage. And of course, no recreational drugs are part of a healthy lifestyle.  And absolutely no smoking or even second hand smoke. Most people know smoking can cause heart and lung diseases, but did you know it also contributes to weakening of your bones? Aging of your skin?  Yellowing of your teeth and nails?  CALCIUM AND VITAMIN D:  Adequate intake of calcium and Vitamin D are recommended.  The recommendations for exact amounts of these supplements seem to change often, but generally speaking 600 mg of calcium (either carbonate or citrate) and 800 units of Vitamin D per day seems prudent. Certain women may benefit from higher intake of Vitamin D.  If you are among these women, your doctor will have told you during your visit.    PAP SMEARS:  Pap smears, to check for cervical cancer or precancers,  have traditionally been done yearly, although recent scientific advances have shown that most women can have pap smears less often.  However, every woman still should have a physical exam from her gynecologist every year. It will include a breast check, inspection of the vulva and vagina to check for abnormal growths or skin changes, a visual exam of the cervix, and then an exam to evaluate the size and shape of the uterus and  ovaries.  And after 67 years of age, a rectal exam is indicated to check for rectal cancers. We will also provide age appropriate advice regarding health maintenance, like when you should have certain vaccines, screening for sexually transmitted diseases, bone density testing, colonoscopy, mammograms, etc.   MAMMOGRAMS:  All women over 40 years old should have a yearly mammogram. Many facilities now offer a "3D" mammogram, which may cost around $50 extra out of pocket. If possible,  we recommend you accept the option to have the 3D mammogram performed.  It both reduces the number of women who will be called back for extra views which then turn out to be normal, and it is better than the routine mammogram at detecting truly abnormal areas.    COLONOSCOPY:  Colonoscopy to screen for colon cancer is recommended for all women at age 50.  We know, you hate the idea of the prep.  We agree, BUT, having colon cancer and not knowing it is worse!!  Colon cancer so often starts as a polyp that can be seen and removed at colonscopy, which can quite literally save your life!  And if your first colonoscopy is normal and you have no family history of colon cancer, most women don't have to have it again for 10 years.  Once every ten years, you can do something that may end up saving your life, right?  We will be happy to help you get it scheduled when you are ready.    Be sure to check your insurance coverage so you understand how much it will cost.  It may be covered as a preventative service at no cost, but you should check your particular policy.     Mirabegron extended-release tablets What is this medicine? MIRABEGRON (MIR a BEG ron) is used to treat overactive bladder. This medicine reduces the amount of bathroom visits. It may also help to control wetting accidents. It may be used alone, but sometimes may be given with other treatments. This medicine may be used for other purposes; ask your health care provider or  pharmacist if you have questions. COMMON BRAND NAME(S): Myrbetriq What should I tell my health care provider before I take this medicine? They need to know if you have any of these conditions:  high blood pressure  kidney disease  liver disease  problems urinating  prostate disease  an unusual or allergic reaction to mirabegron, other medicines, foods, dyes, or preservatives  pregnant or trying to get pregnant  breast-feeding How should I use this medicine? Take this medicine by mouth with a glass of water. Follow the directions on the prescription label. Do not cut, crush or chew this medicine. You can take it with or without food. If it upsets your stomach, take it with food. Take your medicine at regular intervals. Do not take it more often than directed. Do not stop taking except on your doctor's advice. Talk to your pediatrician regarding the use of this medicine in children. Special care may be needed. Overdosage: If you think you have taken too much of this medicine contact a poison control center or emergency room at once. NOTE: This medicine is only for you. Do not share this medicine with others. What if I miss a dose? If you miss a dose, take it as soon as you can. If it is almost time for your next dose, take only that dose. Do not take double or extra doses. What may interact with this medicine?  codeine  desipramine  digoxin  flecainide  MAOIs like Carbex, Eldepryl, Marplan, Nardil, and Parnate  methadone  metoprolol  pimozide  propafenone  thioridazine  warfarin This list may not describe all possible interactions. Give your health care provider a list of all the medicines, herbs, non-prescription drugs, or dietary supplements you use. Also tell them if you smoke, drink alcohol, or use illegal drugs. Some items may interact with your medicine. What should I watch for while using this medicine? Visit your doctor or health care professional for regular  checks on your progress. Check your blood pressure as directed. Ask your doctor or health care professional what your blood pressure should be and when you should contact him or her. You may need to limit your intake of tea, coffee, caffeinated sodas, or alcohol. These drinks may make your symptoms worse. What side effects may I notice from receiving this medicine? Side effects that you should report to your doctor or health care professional as soon as possible:  allergic reactions like skin rash, itching or hives, swelling of the face, lips, or tongue  high blood pressure  fast, irregular heartbeat  redness, blistering, peeling or loosening of the skin, including inside the mouth  signs of infection like fever or chills; pain or difficulty passing urine  trouble passing urine or change in the amount of urine Side effects that usually do not require medical attention (report to your doctor or health care professional if they continue or are bothersome):  constipation  dry mouth  headache  runny nose  stomach upset This list may not describe all possible side effects. Call your doctor for medical advice about side effects. You may report side effects to FDA at 1-800-FDA-1088. Where should I keep my medicine? Keep out of the reach of children. Store at room temperature between 15 and 30 degrees C (59 and 86 degrees F). Throw away any unused medicine after the expiration date. NOTE: This sheet is a summary. It may not cover all possible information. If you have questions about this medicine, talk to your doctor, pharmacist, or health care provider.  2020 Elsevier/Gold Standard (2016-11-12 11:33:21)  Consider Myrbetriq for overactive bladder.  You may also wish to see a physical therapist or a urologist about your bladder control.

## 2019-07-12 NOTE — Progress Notes (Signed)
69 y.o. G49P0012 Married Caucasian female here for annual exam.    Denies vaginal bleeding.   UTI with hematuria in May, 2020 that took a while to treat. UTI followed intercourse.   Now on Trospium for overactive bladder, prescribed by Dr. Justin Mend.  Working fair per patient.   Had back surgery this year for spinal stenosis and is finishing PT.   PCP: Maurice Small, MD    Patient's last menstrual period was 07/06/1998 (approximate).           Sexually active: Yes.    The current method of family planning is post menopausal status.    Exercising: Yes.    has PT since back surgery--also walks daily Smoker:  no  Health Maintenance: Pap: 07-04-18 Neg, 06-01-16 Neg:Neg HR HPV, 04-28-13 Neg History of abnormal Pap:  no MMG: 9/20--revealed a papilloma?--had breast surgery--called for report.  She had benign bilateral breast biopsies prior to this. Colonoscopy: Was due 04/2019--will call to schedule BMD:  2018 Result :Normal per patient TDaP:  07-02-17 Gardasil:   no HIV:no Hep C:no Screening Labs:  PCP Flu vaccine:  completed   reports that she has never smoked. She has never used smokeless tobacco. She reports current alcohol use of about 1.0 standard drinks of alcohol per week. She reports that she does not use drugs.  Past Medical History:  Diagnosis Date  . Asthma   . Chronic back pain   . Diabetes mellitus without complication ([place])    no med  . GERD (gastroesophageal reflux disease)   . Hyperlipemia   . Hypertension   . Hypothyroidism   . Insomnia   . OAB (overactive bladder)   . Seasonal allergies   . Sleep apnea    uses a cpap    Past Surgical History:  Procedure Laterality Date  . ANKLE ARTHROTOMY  2003   left-fx  . APPENDECTOMY    . back fusion  02/14/2019   L4 back fusion  . BACK SURGERY  2010   lumb disk  . BREAST LUMPECTOMY WITH RADIOACTIVE SEED LOCALIZATION Left 06/13/2019   Procedure: LEFT BREAST LUMPECTOMY WITH RADIOACTIVE SEED LOCALIZATION;  Surgeon:  Erroll Luna, MD;  Location: [facility];  Service: General;  Laterality: Left;  . CARPAL TUNNEL RELEASE     rt  . CARPAL TUNNEL RELEASE Left 08/01/2013   Procedure: LEFT CARPAL TUNNEL RELEASE;  Surgeon: Wynonia Sours, MD;  Location: [facility];  Service: Orthopedics;  Laterality: Left;  . CESAREAN SECTION  83,86  . COLONOSCOPY    . DILATATION & CURETTAGE/HYSTEROSCOPY WITH MYOSURE N/A 08/23/2018   Procedure: DILATATION & CURETTAGE/HYSTEROSCOPY WITH MYOSURE POLYPECTOMY;  Surgeon: Nunzio Cobbs, MD;  Location[facility];  Service: Gynecology;  Laterality: N/A;  fibroid resection  . DILATION AND CURETTAGE OF UTERUS     x2  . MASS EXCISION N/A 08/04/2017   Procedure: EXCISION LIPOMA UPPER BACK x2 AND NECK;  Surgeon: Erroll Luna, MD;  Location: [facility];  Service: General;  Laterality: N/A;  . NASAL SEPTOPLASTY W/ TURBINOPLASTY Bilateral 06/21/2015   Procedure: NASAL SEPTOPLASTY WITH BILATERAL TURBINATE REDUCTION;  Surgeon: Jerrell Belfast, MD;  Location: [facility];  Service: ENT;  Laterality: Bilateral;  . SINOSCOPY    . TONSILLECTOMY    . TUBAL LIGATION      Current Outpatient Medications  Medication Sig Dispense Refill  . 5-Hydroxytryptophan (5-HTP MAXIMUM STRENGTH) 200 MG CAPS Take 200 mg by mouth daily.    Marland Kitchen  Acetaminophen (TYLENOL PO) Take by mouth. Takes 57m qam and 2 tablets qhs    . albuterol (PROAIR HFA) 108 (90 Base) MCG/ACT inhaler Inhale 2 puffs into the lungs every 4 (four) hours as needed for wheezing or shortness of breath. 1 Inhaler 3  . amLODipine (NORVASC) 5 MG tablet Take 5 mg by mouth daily.    .Marland Kitchenazelastine (ASTELIN) 0.1 % nasal spray Place 2 sprays into both nostrils 2 (two) times daily. Use in each nostril as directed 60 mL 5  . baclofen (LIORESAL) 20 MG tablet Take 20 mg by mouth 3 (three) times daily as needed.    .Marland KitchenCINNAMON PO 2 capsules Orally once a day    . famotidine-calcium  carbonate-magnesium hydroxide (PEPCID COMPLETE) 10-800-165 MG chewable tablet 1 tablet Orally Twice a day    . fluticasone (FLOVENT HFA) 110 MCG/ACT inhaler Inhale 2 puffs into the lungs 2 (two) times daily. 1 Inhaler 5  . gabapentin (NEURONTIN) 300 MG capsule Take 600 mg by mouth 3 (three) times daily.     .Marland Kitchengabapentin (NEURONTIN) 400 MG capsule Take 400 mg by mouth 4 (four) times daily.    .Marland KitchenGLUCOSAMINE-CHONDROITIN PO Take 1 tablet by mouth daily. Move Free Ultra Triple Action    . glucose blood test strip as directed finger stick once a day for 90 days    . hydrochlorothiazide (MICROZIDE) 12.5 MG capsule Take 12.5 mg by mouth daily.    . hydrocortisone-pramoxine (PROCTOFOAM HC) rectal foam 1 application as needed Rectal Four times a day if needed for hemorrhoid for 7 days    . levothyroxine (SYNTHROID, LEVOTHROID) 100 MCG tablet     . losartan (COZAAR) 100 MG tablet Take 100 mg by mouth daily.    . Melatonin 3 MG TABS Take 3 mg by mouth as needed.    . metFORMIN (GLUCOPHAGE-XR) 500 MG 24 hr tablet 1 tablet with evening meal Orally Once a day for 90 days    . montelukast (SINGULAIR) 10 MG tablet Take 10 mg by mouth at bedtime.    . Multiple Vitamin (MULTIVITAMIN WITH MINERALS) TABS tablet Take 1 tablet by mouth at bedtime.    . Omega-3 Fatty Acids (FISH OIL) 1200 MG CAPS Take 1,200 mg by mouth daily.    . simvastatin (ZOCOR) 20 MG tablet Take 20 mg by mouth at bedtime.     . Trospium Chloride 60 MG CP24 Take 1 capsule by mouth every morning.    . Turmeric 500 MG CAPS 2 capsules Orally once a day    . zolpidem (AMBIEN) 10 MG tablet Take 5 mg by mouth at bedtime as needed for sleep.      No current facility-administered medications for this visit.    Family History  Problem Relation Age of Onset  . Allergic rhinitis Father   . Allergic rhinitis Sister   . Asthma Sister   . Uterine cancer Sister 624 . Asthma Paternal Aunt   . Multiple myeloma Sister   . Angioedema Neg Hx   . Eczema Neg  Hx   . Immunodeficiency Neg Hx   . Urticaria Neg Hx     Review of Systems  All other systems reviewed and are negative.   Exam:   BP (!) 142/72 (Cuff Size: Large)   Pulse 100   Temp (!) 97.2 F (36.2 C) (Temporal)   Resp (!) 22   Ht _0  (1.676 m)   Wt 233 lb (105.7 kg)   LMP 07/06/1998 (Approximate)  BMI 37.61 kg/m     General appearance: alert, cooperative and appears stated age Head: normocephalic, without obvious abnormality, atraumatic Neck: no adenopathy, supple, symmetrical, trachea midline and thyroid normal to inspection and palpation Lungs: clear to auscultation bilaterally Breasts: normal appearance, no masses or tenderness, No nipple retraction or dimpling, No nipple discharge or bleeding, No axillary adenopathy Heart: regular rate and rhythm Abdomen: soft, non-tender; no masses, no organomegaly Extremities: extremities normal, atraumatic, no cyanosis or edema Skin: skin color, texture, turgor normal. No rashes or lesions Lymph nodes: cervical, supraclavicular, and axillary nodes normal. Neurologic: grossly normal  Pelvic: External genitalia:  no lesions              No abnormal inguinal nodes palpated.              Urethra:  normal appearing urethra with no masses, tenderness or lesions              Bartholins and Skenes: normal                 Vagina: normal appearing vagina with normal color and discharge, no lesions.  4 mm submucosal cyst palpated.  Atrophy and bleeding occurred with contact of vagina with speculum.              Cervix: no lesions              Pap taken: No. Bimanual Exam:  Uterus:  normal size, contour, position, consistency, mobility, non-tender              Adnexa: no mass, fullness, tenderness              Rectal exam: Yes.  .  Confirms.              Anus:  normal sphincter tone, no lesions  Chaperone was present for exam.  Assessment:   Well woman visit with normal exam. Status post left breast lumpectomy for intraductal  papilloma, fibroadenoma, and fibrocystic change with sclerosing adenosis. Status post hysteroscopy with polypectomy, dilation and curettage. FH uterine cancer.  Small vaginal cyst.  Atrophy. Overactive bladder. UTI this year.  Plan: Mammogram screening discussed. Self breast awareness reviewed. Pap and HR HPV as above. Guidelines for Calcium, Vitamin D, regular exercise program including cardiovascular and weight bearing exercise. We discussed Myrbetriq, physical therapy and referral to a urologist.  She will discuss with her PCP.  Rx for Estrace vaginal cream.  I discussed potential effect on breast cancer. Follow up annually and prn.   After visit summary provided.

## 2019-07-13 DIAGNOSIS — M4327 Fusion of spine, lumbosacral region: Secondary | ICD-10-CM | POA: Diagnosis not present

## 2019-07-18 DIAGNOSIS — M4327 Fusion of spine, lumbosacral region: Secondary | ICD-10-CM | POA: Diagnosis not present

## 2019-07-20 DIAGNOSIS — M4327 Fusion of spine, lumbosacral region: Secondary | ICD-10-CM | POA: Diagnosis not present

## 2019-07-27 DIAGNOSIS — R69 Illness, unspecified: Secondary | ICD-10-CM | POA: Diagnosis not present

## 2019-07-27 DIAGNOSIS — E119 Type 2 diabetes mellitus without complications: Secondary | ICD-10-CM | POA: Diagnosis not present

## 2019-07-27 DIAGNOSIS — E039 Hypothyroidism, unspecified: Secondary | ICD-10-CM | POA: Diagnosis not present

## 2019-07-27 DIAGNOSIS — I10 Essential (primary) hypertension: Secondary | ICD-10-CM | POA: Diagnosis not present

## 2019-07-27 DIAGNOSIS — J4530 Mild persistent asthma, uncomplicated: Secondary | ICD-10-CM | POA: Diagnosis not present

## 2019-07-27 DIAGNOSIS — E782 Mixed hyperlipidemia: Secondary | ICD-10-CM | POA: Diagnosis not present

## 2019-07-27 DIAGNOSIS — Z0000 Encounter for general adult medical examination without abnormal findings: Secondary | ICD-10-CM | POA: Diagnosis not present

## 2019-08-09 DIAGNOSIS — Z7984 Long term (current) use of oral hypoglycemic drugs: Secondary | ICD-10-CM | POA: Diagnosis not present

## 2019-08-09 DIAGNOSIS — I10 Essential (primary) hypertension: Secondary | ICD-10-CM | POA: Diagnosis not present

## 2019-08-09 DIAGNOSIS — E782 Mixed hyperlipidemia: Secondary | ICD-10-CM | POA: Diagnosis not present

## 2019-08-09 DIAGNOSIS — E039 Hypothyroidism, unspecified: Secondary | ICD-10-CM | POA: Diagnosis not present

## 2019-08-09 DIAGNOSIS — E119 Type 2 diabetes mellitus without complications: Secondary | ICD-10-CM | POA: Diagnosis not present

## 2019-08-16 DIAGNOSIS — Z86010 Personal history of colonic polyps: Secondary | ICD-10-CM | POA: Diagnosis not present

## 2019-08-16 DIAGNOSIS — R1310 Dysphagia, unspecified: Secondary | ICD-10-CM | POA: Diagnosis not present

## 2019-08-17 DIAGNOSIS — M4327 Fusion of spine, lumbosacral region: Secondary | ICD-10-CM | POA: Diagnosis not present

## 2019-08-17 DIAGNOSIS — M7918 Myalgia, other site: Secondary | ICD-10-CM | POA: Diagnosis not present

## 2019-08-17 DIAGNOSIS — M419 Scoliosis, unspecified: Secondary | ICD-10-CM | POA: Diagnosis not present

## 2019-08-17 DIAGNOSIS — M545 Low back pain: Secondary | ICD-10-CM | POA: Diagnosis not present

## 2019-09-11 DIAGNOSIS — Z23 Encounter for immunization: Secondary | ICD-10-CM | POA: Diagnosis not present

## 2019-09-22 DIAGNOSIS — Z1159 Encounter for screening for other viral diseases: Secondary | ICD-10-CM | POA: Diagnosis not present

## 2019-09-27 DIAGNOSIS — K2930 Chronic superficial gastritis without bleeding: Secondary | ICD-10-CM | POA: Diagnosis not present

## 2019-09-27 DIAGNOSIS — Z86010 Personal history of colonic polyps: Secondary | ICD-10-CM | POA: Diagnosis not present

## 2019-09-27 DIAGNOSIS — D122 Benign neoplasm of ascending colon: Secondary | ICD-10-CM | POA: Diagnosis not present

## 2019-09-27 DIAGNOSIS — K621 Rectal polyp: Secondary | ICD-10-CM | POA: Diagnosis not present

## 2019-09-27 DIAGNOSIS — D120 Benign neoplasm of cecum: Secondary | ICD-10-CM | POA: Diagnosis not present

## 2019-09-27 DIAGNOSIS — D123 Benign neoplasm of transverse colon: Secondary | ICD-10-CM | POA: Diagnosis not present

## 2019-09-27 DIAGNOSIS — R1310 Dysphagia, unspecified: Secondary | ICD-10-CM | POA: Diagnosis not present

## 2019-09-27 DIAGNOSIS — D121 Benign neoplasm of appendix: Secondary | ICD-10-CM | POA: Diagnosis not present

## 2019-09-27 DIAGNOSIS — K6289 Other specified diseases of anus and rectum: Secondary | ICD-10-CM | POA: Diagnosis not present

## 2019-09-27 DIAGNOSIS — K228 Other specified diseases of esophagus: Secondary | ICD-10-CM | POA: Diagnosis not present

## 2019-09-27 DIAGNOSIS — K317 Polyp of stomach and duodenum: Secondary | ICD-10-CM | POA: Diagnosis not present

## 2019-09-27 DIAGNOSIS — K635 Polyp of colon: Secondary | ICD-10-CM | POA: Diagnosis not present

## 2019-09-29 DIAGNOSIS — K317 Polyp of stomach and duodenum: Secondary | ICD-10-CM | POA: Diagnosis not present

## 2019-09-29 DIAGNOSIS — K2930 Chronic superficial gastritis without bleeding: Secondary | ICD-10-CM | POA: Diagnosis not present

## 2019-09-29 DIAGNOSIS — D120 Benign neoplasm of cecum: Secondary | ICD-10-CM | POA: Diagnosis not present

## 2019-09-29 DIAGNOSIS — K635 Polyp of colon: Secondary | ICD-10-CM | POA: Diagnosis not present

## 2019-09-29 DIAGNOSIS — K228 Other specified diseases of esophagus: Secondary | ICD-10-CM | POA: Diagnosis not present

## 2019-09-29 DIAGNOSIS — K621 Rectal polyp: Secondary | ICD-10-CM | POA: Diagnosis not present

## 2019-09-29 DIAGNOSIS — D123 Benign neoplasm of transverse colon: Secondary | ICD-10-CM | POA: Diagnosis not present

## 2019-09-29 DIAGNOSIS — D121 Benign neoplasm of appendix: Secondary | ICD-10-CM | POA: Diagnosis not present

## 2019-09-29 DIAGNOSIS — D122 Benign neoplasm of ascending colon: Secondary | ICD-10-CM | POA: Diagnosis not present

## 2019-11-02 ENCOUNTER — Telehealth: Payer: Self-pay

## 2019-11-02 NOTE — Patient Outreach (Signed)
Received a [employer], [name] is doing very well, I will be sending a [employer] program packet to her, if she needs [place] in the future.

## 2019-11-16 ENCOUNTER — Encounter: Payer: Self-pay | Admitting: Obstetrics and Gynecology

## 2019-11-16 DIAGNOSIS — G4733 Obstructive sleep apnea (adult) (pediatric): Secondary | ICD-10-CM | POA: Diagnosis not present

## 2019-11-16 DIAGNOSIS — R928 Other abnormal and inconclusive findings on diagnostic imaging of breast: Secondary | ICD-10-CM | POA: Diagnosis not present

## 2019-11-28 DIAGNOSIS — G4733 Obstructive sleep apnea (adult) (pediatric): Secondary | ICD-10-CM | POA: Diagnosis not present

## 2019-12-07 DIAGNOSIS — D239 Other benign neoplasm of skin, unspecified: Secondary | ICD-10-CM | POA: Diagnosis not present

## 2019-12-07 DIAGNOSIS — D2262 Melanocytic nevi of left upper limb, including shoulder: Secondary | ICD-10-CM | POA: Diagnosis not present

## 2019-12-07 DIAGNOSIS — L821 Other seborrheic keratosis: Secondary | ICD-10-CM | POA: Diagnosis not present

## 2019-12-07 DIAGNOSIS — L814 Other melanin hyperpigmentation: Secondary | ICD-10-CM | POA: Diagnosis not present

## 2019-12-07 DIAGNOSIS — D2271 Melanocytic nevi of right lower limb, including hip: Secondary | ICD-10-CM | POA: Diagnosis not present

## 2019-12-07 DIAGNOSIS — L578 Other skin changes due to chronic exposure to nonionizing radiation: Secondary | ICD-10-CM | POA: Diagnosis not present

## 2019-12-07 DIAGNOSIS — L918 Other hypertrophic disorders of the skin: Secondary | ICD-10-CM | POA: Diagnosis not present

## 2019-12-07 DIAGNOSIS — D225 Melanocytic nevi of trunk: Secondary | ICD-10-CM | POA: Diagnosis not present

## 2019-12-14 ENCOUNTER — Ambulatory Visit: Payer: Medicare HMO | Admitting: Allergy

## 2019-12-14 ENCOUNTER — Encounter: Payer: Self-pay | Admitting: Allergy

## 2019-12-14 ENCOUNTER — Other Ambulatory Visit: Payer: Self-pay

## 2019-12-14 VITALS — BP 122/76 | HR 74 | Temp 97.7°F | Resp 18 | Ht 64.0 in | Wt 241.8 lb

## 2019-12-14 DIAGNOSIS — J310 Chronic rhinitis: Secondary | ICD-10-CM | POA: Diagnosis not present

## 2019-12-14 DIAGNOSIS — J4530 Mild persistent asthma, uncomplicated: Secondary | ICD-10-CM | POA: Diagnosis not present

## 2019-12-14 MED ORDER — FLOVENT HFA 110 MCG/ACT IN AERO: 2.0000 | INHALATION_SPRAY | Freq: Two times a day (BID) | RESPIRATORY_TRACT | 5 refills | Status: DC

## 2019-12-14 NOTE — Progress Notes (Signed)
Follow-up Note  RE: [name] MRN: [id number] DOB: 03-10-1951 Date of Office Visit: 12/14/2019   History of present illness: [name] is a 69 y.o. female presenting today for follow-up of asthma and mixed rhinitis.  She was last seen in the office on 06/15/2019 by myself.  She states she has been doing relatively well since this time without any major health changes.  She is fully vaccinated and received [employer] vaccine with last dose in February. She states over the last couple of days she has been having more cough and some wheezing.  She states she believes this is due to a recently going to a family owned home at the [place] that has unknown mold issue.  He states they are trying to clean it up so that it can be properly used.  Since she does not need to use her albuterol much at all she did not think to bring her inhaler with her to perform this task.  Even with the coughing and some wheeze she states is not enough that she has needed to use her albuterol.  She has taken her Flovent however due to these symptoms.  She does continue to take singular daily.  She has not required any ED or urgent care visits or any systemic steroid needs. She states over the past month or so she has been having more sneezing bouts in the morning.  Currently not taking any antihistamines.  She does continue to use her Astelin nasal spray.  Today she states she is feeling more throat clearing as well. She also states she has been having some episodic ear pain mostly of the left ear.  Review of systems: Review of Systems  Constitutional: Negative.   HENT:       See HPI  Eyes: Negative.   Respiratory:       See HPI  Cardiovascular: Negative.   Gastrointestinal: Negative.   Musculoskeletal: Negative.   Skin: Negative.   Neurological: Negative.     All other systems negative unless noted above in HPI  Past medical/social/surgical/family history have been reviewed and are unchanged unless  specifically indicated below.  No changes  Medication List: Current Outpatient Medications  Medication Sig Dispense Refill  . 5-Hydroxytryptophan (5-HTP MAXIMUM STRENGTH) 200 MG CAPS Take 200 mg by mouth daily.    . Acetaminophen (TYLENOL PO) Take by mouth. Takes 500mg  qam and 2 tablets qhs    . albuterol (PROAIR HFA) 108 (90 Base) MCG/ACT inhaler Inhale 2 puffs into the lungs every 4 (four) hours as needed for wheezing or shortness of breath. 1 Inhaler 3  . amLODipine (NORVASC) 5 MG tablet Take 5 mg by mouth daily.    Marland Kitchen azelastine (ASTELIN) 0.1 % nasal spray Place 2 sprays into both nostrils 2 (two) times daily. Use in each nostril as directed 60 mL 5  . baclofen (LIORESAL) 20 MG tablet Take 20 mg by mouth 3 (three) times daily as needed.    Marland Kitchen CINNAMON PO 2 capsules Orally once a day    . famotidine-calcium carbonate-magnesium hydroxide (PEPCID COMPLETE) 10-800-165 MG chewable tablet 1 tablet Orally Twice a day    . fluticasone (FLOVENT HFA) 110 MCG/ACT inhaler Inhale 2 puffs into the lungs 2 (two) times daily. 1 Inhaler 5  . gabapentin (NEURONTIN) 400 MG capsule Take 400 mg by mouth 4 (four) times daily.    Marland Kitchen GLUCOSAMINE-CHONDROITIN PO Take 1 tablet by mouth daily. Move Free Ultra Triple Action    .  glucose blood test strip as directed finger stick once a day for 90 days    . hydrochlorothiazide (MICROZIDE) 12.5 MG capsule Take 12.5 mg by mouth daily.    Marland Kitchen levothyroxine (SYNTHROID, LEVOTHROID) 100 MCG tablet     . losartan (COZAAR) 100 MG tablet Take 100 mg by mouth daily.    . Melatonin 3 MG TABS Take 3 mg by mouth as needed.    . metFORMIN (GLUCOPHAGE-XR) 500 MG 24 hr tablet 1 tablet with evening meal Orally Once a day for 90 days    . montelukast (SINGULAIR) 10 MG tablet Take 10 mg by mouth at bedtime.    . Multiple Vitamin (MULTIVITAMIN WITH MINERALS) TABS tablet Take 1 tablet by mouth at bedtime.    . Omega-3 Fatty Acids (FISH OIL) 1200 MG CAPS Take 1,200 mg by mouth daily.    .  simvastatin (ZOCOR) 20 MG tablet Take 20 mg by mouth at bedtime.     . Trospium Chloride 60 MG CP24 Take 1 capsule by mouth every morning.    . Turmeric 500 MG CAPS 2 capsules Orally once a day    . zolpidem (AMBIEN) 10 MG tablet Take 5 mg by mouth at bedtime as needed for sleep.     Marland Kitchen estradiol (ESTRACE) 0.1 MG/GM vaginal cream Use 1/2 g vaginally every night for the first 2 weeks, then use 1/2 g vaginally two or three times per week as needed to maintain symptom relief. 42.5 g 1  . gabapentin (NEURONTIN) 300 MG capsule Take 600 mg by mouth 3 (three) times daily.     . hydrocortisone-pramoxine (PROCTOFOAM HC) rectal foam 1 application as needed Rectal Four times a day if needed for hemorrhoid for 7 days (Patient not taking: Reported on 12/14/2019)     No current facility-administered medications for this visit.     Known medication allergies: Allergies  Allergen Reactions  . Oxycodone-Acetaminophen Nausea Only     Physical examination: Blood pressure 122/76, pulse 74, temperature 97.7 F (36.5 C), temperature source Temporal, resp. rate 18, height 5\' 4"  (1.626 m), weight 241 lb 12.8 oz (109.7 kg), last menstrual period 07/06/1998, SpO2 95 %.  General: Alert, interactive, in no acute distress. HEENT: PERRLA, TMs pearly gray, turbinates non-edematous without discharge, post-pharynx non erythematous. Neck: Supple without lymphadenopathy. Lungs: Clear to auscultation without wheezing, rhonchi or rales. {no increased work of breathing. CV: Normal S1, S2 without murmurs. Abdomen: Nondistended, nontender. Skin: Warm and dry, without lesions or rashes. Extremities:  No clubbing, cyanosis or edema. Neuro:   Grossly intact.  Diagnositics/Labs:  Spirometry: FEV1: 2.11L 89%, FVC: 2.45L 79%, ratio consistent with nonobstructive pattern  Assessment and plan:   Asthma, mild persistent  - Use Flovent 110 g  2 puffs 1-2 times a day if having any asthma symptoms triggered by allergens or other  environmental exposure.  Take Flovent 2 puffs twice a day if having an asthma flare, respiratory illness.   Take Flovent daily if not meeting the below goals.    - Continue Singulair 10 mg daily  - have access to albuterol inhaler 2 puffs every 4-6 hours as needed for cough/wheeze/shortness of breath/chest tightness.  May use 15-20 minutes prior to activity.   Monitor frequency of use.    Asthma control goals:   Full participation in all desired activities (may need albuterol before activity)  Albuterol use two time or less a week on average (not counting use with activity)  Cough interfering with sleep two time or less  a month  Oral steroids no more than once a year  No hospitalizations   Mixed rhinitis  - Continue Singulair as above  - Start Xyzal 5mg  daily in evening to help minimize sneezing episodes  - Continue Flonase 1-2 sprays each nostril daily as needed for congestion.  Use for 1-2 weeks at a time before stopping once symptoms improved.  Can use if having ear pain to see if it is due to congestion  - Continue Astelin 1-2 sprays twice a day for nasal drainage/post-nasal drip  Follow-up in 6 months or sooner if needed.  I appreciate the opportunity to take part in [name]'s care. Please do not hesitate to contact me with questions.  Sincerely,   Prudy Feeler, MD Allergy/Immunology [facility]

## 2019-12-14 NOTE — Patient Instructions (Addendum)
Asthma  - Use Flovent 110 g  2 puffs 1-2 times a day if having any asthma symptoms triggered by allergens or other environmental exposure.  Take Flovent 2 puffs twice a day if having an asthma flare, respiratory illness.   Take Flovent daily if not meeting the below goals.    - Continue Singulair 10 mg daily  - have access to albuterol inhaler 2 puffs every 4-6 hours as needed for cough/wheeze/shortness of breath/chest tightness.  May use 15-20 minutes prior to activity.   Monitor frequency of use.    Asthma control goals:   Full participation in all desired activities (may need albuterol before activity)  Albuterol use two time or less a week on average (not counting use with activity)  Cough interfering with sleep two time or less a month  Oral steroids no more than once a year  No hospitalizations   Mixed rhinitis  - Continue Singulair as above  - Start Xyzal 5mg  daily in evening to help minimize sneezing episodes  - Continue Flonase 1-2 sprays each nostril daily as needed for congestion.  Use for 1-2 weeks at a time before stopping once symptoms improved.  Can use if having ear pain to see if it is due to congestion  - Continue Astelin 1-2 sprays twice a day for nasal drainage/post-nasal drip  Follow-up in 6 months or sooner if needed.

## 2019-12-17 DIAGNOSIS — G4733 Obstructive sleep apnea (adult) (pediatric): Secondary | ICD-10-CM | POA: Diagnosis not present

## 2020-01-16 DIAGNOSIS — G4733 Obstructive sleep apnea (adult) (pediatric): Secondary | ICD-10-CM | POA: Diagnosis not present

## 2020-01-23 DIAGNOSIS — H9202 Otalgia, left ear: Secondary | ICD-10-CM | POA: Diagnosis not present

## 2020-01-25 DIAGNOSIS — E785 Hyperlipidemia, unspecified: Secondary | ICD-10-CM | POA: Diagnosis not present

## 2020-01-25 DIAGNOSIS — E039 Hypothyroidism, unspecified: Secondary | ICD-10-CM | POA: Diagnosis not present

## 2020-01-25 DIAGNOSIS — I10 Essential (primary) hypertension: Secondary | ICD-10-CM | POA: Diagnosis not present

## 2020-01-25 DIAGNOSIS — E119 Type 2 diabetes mellitus without complications: Secondary | ICD-10-CM | POA: Diagnosis not present

## 2020-01-25 DIAGNOSIS — R69 Illness, unspecified: Secondary | ICD-10-CM | POA: Diagnosis not present

## 2020-02-02 DIAGNOSIS — H524 Presbyopia: Secondary | ICD-10-CM | POA: Diagnosis not present

## 2020-02-02 DIAGNOSIS — E119 Type 2 diabetes mellitus without complications: Secondary | ICD-10-CM | POA: Diagnosis not present

## 2020-02-14 DIAGNOSIS — M4327 Fusion of spine, lumbosacral region: Secondary | ICD-10-CM | POA: Diagnosis not present

## 2020-02-14 DIAGNOSIS — M7918 Myalgia, other site: Secondary | ICD-10-CM | POA: Diagnosis not present

## 2020-02-14 DIAGNOSIS — M419 Scoliosis, unspecified: Secondary | ICD-10-CM | POA: Diagnosis not present

## 2020-02-22 DIAGNOSIS — R69 Illness, unspecified: Secondary | ICD-10-CM | POA: Diagnosis not present

## 2020-02-22 DIAGNOSIS — Z630 Problems in relationship with spouse or partner: Secondary | ICD-10-CM | POA: Diagnosis not present

## 2020-02-22 DIAGNOSIS — Z0100 Encounter for examination of eyes and vision without abnormal findings: Secondary | ICD-10-CM | POA: Diagnosis not present

## 2020-02-29 DIAGNOSIS — G4700 Insomnia, unspecified: Secondary | ICD-10-CM | POA: Diagnosis not present

## 2020-02-29 DIAGNOSIS — R69 Illness, unspecified: Secondary | ICD-10-CM | POA: Diagnosis not present

## 2020-03-02 ENCOUNTER — Other Ambulatory Visit: Payer: Self-pay | Admitting: Obstetrics and Gynecology

## 2020-03-04 NOTE — Telephone Encounter (Signed)
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   Medication pended for #42.5g, 0RF. Please refill if appropriate.

## 2020-03-07 DIAGNOSIS — R69 Illness, unspecified: Secondary | ICD-10-CM | POA: Diagnosis not present

## 2020-03-07 DIAGNOSIS — Z630 Problems in relationship with spouse or partner: Secondary | ICD-10-CM | POA: Diagnosis not present

## 2020-03-25 DIAGNOSIS — R69 Illness, unspecified: Secondary | ICD-10-CM | POA: Diagnosis not present

## 2020-04-18 DIAGNOSIS — G4733 Obstructive sleep apnea (adult) (pediatric): Secondary | ICD-10-CM | POA: Diagnosis not present

## 2020-04-25 DIAGNOSIS — R200 Anesthesia of skin: Secondary | ICD-10-CM | POA: Diagnosis not present

## 2020-04-25 DIAGNOSIS — M542 Cervicalgia: Secondary | ICD-10-CM | POA: Diagnosis not present

## 2020-04-25 DIAGNOSIS — D179 Benign lipomatous neoplasm, unspecified: Secondary | ICD-10-CM | POA: Diagnosis not present

## 2020-05-19 DIAGNOSIS — G4733 Obstructive sleep apnea (adult) (pediatric): Secondary | ICD-10-CM | POA: Diagnosis not present

## 2020-05-28 ENCOUNTER — Encounter: Payer: Self-pay | Admitting: Physical Therapy

## 2020-05-28 ENCOUNTER — Other Ambulatory Visit: Payer: Self-pay

## 2020-05-28 ENCOUNTER — Ambulatory Visit: Payer: Medicare HMO | Attending: Family Medicine | Admitting: Physical Therapy

## 2020-05-28 DIAGNOSIS — M5412 Radiculopathy, cervical region: Secondary | ICD-10-CM | POA: Diagnosis not present

## 2020-05-28 DIAGNOSIS — M6281 Muscle weakness (generalized): Secondary | ICD-10-CM | POA: Diagnosis not present

## 2020-05-28 NOTE — Therapy (Signed)
[facility] [address], [place], [place], [address] Phone: [phone]   Fax:  [phone]  Physical Therapy Evaluation  Patient Details  Name: [name] MRN: [id number] Date of Birth: 24-Mar-1951 Referring Provider (PT): Dr. London Pepper   Encounter Date: 05/28/2020   PT End of Session - 05/28/20 1725    Visit Number 1    Date for PT Re-Evaluation 08/20/20    Authorization Type [employer] Medicare    PT Start Time 1937    PT Stop Time 1059    PT Time Calculation (min) 44 min    Activity Tolerance Patient tolerated treatment well           Past Medical History:  Diagnosis Date   Asthma    Chronic back pain    Diabetes mellitus without complication ([place])    no med   GERD (gastroesophageal reflux disease)    Hyperlipemia    Hypertension    Hypothyroidism    Insomnia    OAB (overactive bladder)    Seasonal allergies    Sleep apnea    uses a cpap    Past Surgical History:  Procedure Laterality Date   ANKLE ARTHROTOMY  2003   left-fx   APPENDECTOMY     back fusion  02/14/2019   L4 back fusion   BACK SURGERY  2010   lumb disk   BREAST LUMPECTOMY WITH RADIOACTIVE SEED LOCALIZATION Left 06/13/2019   Procedure: LEFT BREAST LUMPECTOMY WITH RADIOACTIVE SEED LOCALIZATION;  Surgeon: Erroll Luna, MD;  Location: [place];  Service: General;  Laterality: Left;   CARPAL TUNNEL RELEASE     rt   CARPAL TUNNEL RELEASE Left 08/01/2013   Procedure: LEFT CARPAL TUNNEL RELEASE;  Surgeon: Wynonia Sours, MD;  Location: [place];  Service: Orthopedics;  Laterality: Left;   CESAREAN SECTION  83,86   COLONOSCOPY     DILATATION & CURETTAGE/HYSTEROSCOPY WITH MYOSURE N/A 08/23/2018   Procedure: DILATATION & CURETTAGE/HYSTEROSCOPY WITH MYOSURE POLYPECTOMY;  Surgeon: Nunzio Cobbs, MD;  Location: [facility];  Service: Gynecology;  Laterality:  N/A;  fibroid resection   DILATION AND CURETTAGE OF UTERUS     x2   MASS EXCISION N/A 08/04/2017   Procedure: EXCISION LIPOMA UPPER BACK x2 AND NECK;  Surgeon: Erroll Luna, MD;  Location: [facility];  Service: General;  Laterality: N/A;   NASAL SEPTOPLASTY W/ TURBINOPLASTY Bilateral 06/21/2015   Procedure: NASAL SEPTOPLASTY WITH BILATERAL TURBINATE REDUCTION;  Surgeon: Jerrell Belfast, MD;  Location: [facility];  Service: ENT;  Laterality: Bilateral;   SINOSCOPY     TONSILLECTOMY     TUBAL LIGATION      There were no vitals filed for this visit.    Subjective Assessment - 05/28/20 1018    Subjective Symptoms started in thumb and now whole arm.  Started in September.    Pertinent History lumbar fusion 2020;  lipomas right upper trap region;  bil Carpal tunnel release;  PT for neck in 2014 but resolved (no UE symptoms with that)    Limitations Sitting    Diagnostic tests none    Patient Stated Goals not have this pain and feeling in right arm; strategies to improve that    Currently in Pain? Yes    Pain Score 6     Pain Location Neck    Pain Orientation Right    Pain Type Chronic pain    Pain Radiating  Towards right UE to thumb    Pain Onset More than a month ago    Pain Frequency Intermittent    Aggravating Factors  sitting in car on trip; right side bending    Pain Relieving Factors tilt head to the left it goes away; heat and TENS;    Multiple Pain Sites Yes              [facility] PT Assessment - 05/28/20 0001      Assessment   Medical Diagnosis neck pain, right shoulder pain     Referring Provider (PT) Dr. London Pepper    Onset Date/Surgical Date --   Sept   Hand Dominance Right    Next MD Visit as needed     Prior Therapy 2014 for neck       Precautions   Precautions None      Restrictions   Weight Bearing Restrictions No      Balance Screen   Has the patient fallen in the past 6 months No    Has the patient had a decrease in activity level  because of a fear of falling?  No    Is the patient reluctant to leave their home because of a fear of falling?  No      Home Ecologist residence      Prior Function   Level of Independence Independent    Vocation Retired    Biomedical scientist retired Marine scientist    Leisure read; go for walks; cooking       Observation/Other Assessments   Focus on Therapeutic Outcomes (FOTO)  36% limitation       Posture/Postural Control   Posture Comments right lipoma near C7      AROM   Overall AROM Comments UE ROM WFLs;  inc peripheral symptoms with cervical retraction, extension     Cervical Flexion 30    Cervical Extension 33    Cervical - Right Side Bend 28    Cervical - Left Side Bend 32    Cervical - Right Rotation 35    Cervical - Left Rotation 40      Strength   Right Shoulder Flexion 4/5    Right Shoulder ABduction 4/5    Right Elbow Flexion 4+/5    Right Elbow Extension 4/5    Right Wrist Flexion 4/5    Right Wrist Extension 4/5    Right Hand Grip (lbs) 45    Left Hand Grip (lbs) 55      Palpation   Palpation comment pain PA C5-6      Spurling's   Findings Positive    Side Right      Distraction Test   Comment no improvement in symptoms      other    Side Right    Comment + upper limb tension test                       Objective measurements completed on examination: See above findings.               PT Education - 05/28/20 1724    Education Details discussed frequent left sidebending every 1-2 hours, sitting posture with lumbar roll;  continued movement/daily walks    Person(s) Educated Patient    Methods Explanation    Comprehension Verbalized understanding            PT Short Term Goals - 05/28/20 1738  PT SHORT TERM GOAL #1   Title The patient will demonstrate understanding of basic self care strategies including sitting postural alignment, left sidebending for symptom control, basic ex to  prevent further strength loss    Time 4    Period Weeks    Status New    Target Date 06/25/20      PT SHORT TERM GOAL #2   Title The patient will report a 25% improvement in right neck and UE symptoms with ADLs including cooking and sleeping    Time 4    Period Weeks    Status New      PT SHORT TERM GOAL #3   Title The patient will have improved cervical flexion to 40 degrees and bil rotation to 45 degrees needed for driving    Time 4    Period Weeks    Status New             PT Long Term Goals - 05/28/20 1741      PT LONG TERM GOAL #1   Title The patient will be independent with safe self progression of HEP    Time 12    Period Weeks    Status New    Target Date 08/20/20      PT LONG TERM GOAL #2   Title The patient will report a 50% improvement in right neck and UE symptoms with usual ADLs    Time 12    Period Weeks    Status New      PT LONG TERM GOAL #3   Title The patient will have grossly 4+/5 right UE strength  and grip on right to 50# needed for cooking/ lifting things in the kitchen    Time 12    Period Weeks    Status New      PT LONG TERM GOAL #4   Title Cervical sidebending to right and left to 40 degrees and extension to 40 degrees needed for ADLs including taking care of grandson    Time 12    Period Weeks    Status New      PT LONG TERM GOAL #5   Title FOTO functional outcome score improved from 36% limitation to 33%    Time 12    Period Weeks    Status New                  Plan - 05/28/20 1725    Clinical Impression Statement The patient reports the onset of right neck, upper arm, forearm and thumb pain in September for no apparent reason.  She states her symptoms may have been aggravated after a long car trip.  She has a history of lipomas in this region which have been surgically removed in the past.  She notes a lipoma has returned and is tender just to the right of C7 spinous process.  Her symptoms are also aggravated with right  sidebending and better with left sidebending.  Decreased cervical ROM in all planes with right sidebending and right rotation more limited than left.  No directional preference with peripheral symptoms produced with all except left sidebending.  Mild strength deficits grossly 4/5 on right with grip 45# on right (dominant side) and 55# on left.  + upper limb tension test and right Spurling.   She would benefit from PT to address these deficits.    Personal Factors and Comorbidities Age;Comorbidity 1;Past/Current Experience    Comorbidities lumbar spinal fusion 2020 with peripheral symptoms  Examination-Activity Limitations Sleep;Carry;Lift    Examination-Participation Restrictions Cleaning;Meal Prep;Laundry    Stability/Clinical Decision Making Stable/Uncomplicated    Clinical Decision Making Low    Rehab Potential Good    PT Frequency 1x / week    PT Duration 12 weeks    PT Treatment/Interventions ADLs/Self Care Home Management;Cryotherapy;Electrical Stimulation;Ultrasound;Traction;Moist Heat;Iontophoresis 4mg /ml Dexamethasone;Therapeutic activities;Therapeutic exercise;Neuromuscular re-education;Manual techniques;Patient/family education;Taping;Dry needling    PT Next Visit Plan recheck manual cervical distraction to assess benefit of cervical traction;  DN with avoidance of lipoma;  pt had U/S in past which she thought was helpful;  right upper quarter strengthening    PT Home Exercise Plan start [place]    Recommended Other Services follow up with doctor regarding lipomas early Dec;  has home TENs unit    Consulted and Agree with Plan of Care Patient           Patient will benefit from skilled therapeutic intervention in order to improve the following deficits and impairments:  Decreased range of motion, Increased fascial restricitons, Pain, Decreased strength, Impaired perceived functional ability  Visit Diagnosis: Radiculopathy, cervical region - Plan: PT plan of care  cert/re-cert  Muscle weakness (generalized) - Plan: PT plan of care cert/re-cert     Problem List Patient Active Problem List   Diagnosis Date Noted   Mild persistent asthma without complication 00/34/9179   Mixed rhinitis 06/10/2018   Obstructive sleep apnea 12/11/2016   Palpitations 06/17/2016   Deflected nasal septum 09/13/2015   Hypertrophy of nasal turbinates 09/13/2015   Deviated nasal septum 06/21/2015    Class: Chronic   HYPERLIPIDEMIA 11/18/2007   Essential hypertension 11/18/2007   Allergic rhinitis 11/18/2007   COUGH 11/18/2007   Ruben Im, PT 05/28/20 5:49 PM Phone: [phone] Fax: [phone]  Alvera Singh 05/28/2020, 5:48 PM  [facility] [address], [place], [place], [address] Phone: [phone]   Fax:  [phone]  Name: ALLEENE STOY MRN: [id number] Date of Birth: 29-Dec-1950

## 2020-06-04 ENCOUNTER — Other Ambulatory Visit: Payer: Self-pay

## 2020-06-04 ENCOUNTER — Ambulatory Visit: Payer: Medicare HMO

## 2020-06-04 DIAGNOSIS — M1990 Unspecified osteoarthritis, unspecified site: Secondary | ICD-10-CM | POA: Diagnosis not present

## 2020-06-04 DIAGNOSIS — R69 Illness, unspecified: Secondary | ICD-10-CM | POA: Diagnosis not present

## 2020-06-04 DIAGNOSIS — G8929 Other chronic pain: Secondary | ICD-10-CM | POA: Diagnosis not present

## 2020-06-04 DIAGNOSIS — G4700 Insomnia, unspecified: Secondary | ICD-10-CM | POA: Diagnosis not present

## 2020-06-04 DIAGNOSIS — M6281 Muscle weakness (generalized): Secondary | ICD-10-CM

## 2020-06-04 DIAGNOSIS — G4733 Obstructive sleep apnea (adult) (pediatric): Secondary | ICD-10-CM | POA: Diagnosis not present

## 2020-06-04 DIAGNOSIS — E039 Hypothyroidism, unspecified: Secondary | ICD-10-CM | POA: Diagnosis not present

## 2020-06-04 DIAGNOSIS — J45909 Unspecified asthma, uncomplicated: Secondary | ICD-10-CM | POA: Diagnosis not present

## 2020-06-04 DIAGNOSIS — E785 Hyperlipidemia, unspecified: Secondary | ICD-10-CM | POA: Diagnosis not present

## 2020-06-04 DIAGNOSIS — M5412 Radiculopathy, cervical region: Secondary | ICD-10-CM

## 2020-06-04 DIAGNOSIS — E1142 Type 2 diabetes mellitus with diabetic polyneuropathy: Secondary | ICD-10-CM | POA: Diagnosis not present

## 2020-06-04 DIAGNOSIS — I10 Essential (primary) hypertension: Secondary | ICD-10-CM | POA: Diagnosis not present

## 2020-06-04 NOTE — Therapy (Signed)
[facility] [address] [place], [place], [address] Phone: [phone]   Fax:  [phone]  Physical Therapy Treatment  Patient Details  Name: [name] MRN: [id number] Date of Birth: 07-11-1950 Referring Provider (PT): Dr. London Pepper   Encounter Date: 06/04/2020   PT End of Session - 06/04/20 1146    Visit Number 2    Date for PT Re-Evaluation 08/20/20    Authorization Type [employer] Medicare    PT Start Time 1106    PT Stop Time 1149    PT Time Calculation (min) 43 min    Activity Tolerance Patient tolerated treatment well    Behavior During Therapy [facility] for tasks assessed/performed           Past Medical History:  Diagnosis Date  . Asthma   . Chronic back pain   . Diabetes mellitus without complication ([place])    no med  . GERD (gastroesophageal reflux disease)   . Hyperlipemia   . Hypertension   . Hypothyroidism   . Insomnia   . OAB (overactive bladder)   . Seasonal allergies   . Sleep apnea    uses a cpap    Past Surgical History:  Procedure Laterality Date  . ANKLE ARTHROTOMY  2003   left-fx  . APPENDECTOMY    . back fusion  02/14/2019   L4 back fusion  . BACK SURGERY  2010   lumb disk  . BREAST LUMPECTOMY WITH RADIOACTIVE SEED LOCALIZATION Left 06/13/2019   Procedure: LEFT BREAST LUMPECTOMY WITH RADIOACTIVE SEED LOCALIZATION;  Surgeon: Erroll Luna, MD;  Location: [place];  Service: General;  Laterality: Left;  . CARPAL TUNNEL RELEASE     rt  . CARPAL TUNNEL RELEASE Left 08/01/2013   Procedure: LEFT CARPAL TUNNEL RELEASE;  Surgeon: Wynonia Sours, MD;  Location: [place];  Service: Orthopedics;  Laterality: Left;  . CESAREAN SECTION  83,86  . COLONOSCOPY    . DILATATION & CURETTAGE/HYSTEROSCOPY WITH MYOSURE N/A 08/23/2018   Procedure: DILATATION & CURETTAGE/HYSTEROSCOPY WITH MYOSURE POLYPECTOMY;  Surgeon: Nunzio Cobbs, MD;  Location:  [facility];  Service: Gynecology;  Laterality: N/A;  fibroid resection  . DILATION AND CURETTAGE OF UTERUS     x2  . MASS EXCISION N/A 08/04/2017   Procedure: EXCISION LIPOMA UPPER BACK x2 AND NECK;  Surgeon: Erroll Luna, MD;  Location: [place];  Service: General;  Laterality: N/A;  . NASAL SEPTOPLASTY W/ TURBINOPLASTY Bilateral 06/21/2015   Procedure: NASAL SEPTOPLASTY WITH BILATERAL TURBINATE REDUCTION;  Surgeon: Jerrell Belfast, MD;  Location: [place];  Service: ENT;  Laterality: Bilateral;  . SINOSCOPY    . TONSILLECTOMY    . TUBAL LIGATION      There were no vitals filed for this visit.   Subjective Assessment - 06/04/20 1105    Subjective Rt shoulder and neck pain.    Currently in Pain? Yes    Pain Score 6     Pain Location Neck    Pain Orientation Right    Pain Descriptors / Indicators Aching;Tingling    Pain Type Chronic pain    Pain Radiating Towards Rt UE to thumb    Pain Onset More than a month ago    Pain Frequency Intermittent    Aggravating Factors  sitting in the car, sitting upright    Pain Relieving Factors bending head to the Lt, TENs  [facility] Adult PT Treatment/Exercise - 06/04/20 0001      Exercises   Exercises Neck      Neck Exercises: Seated   Other Seated Exercise radial nerve glide x5 on Rt      Modalities   Modalities Traction      Traction   Type of Traction Cervical    Min (lbs) 5    Max (lbs) 15    Hold Time 60    Rest Time 10    Time 12      Manual Therapy   Manual Therapy Soft tissue mobilization;Myofascial release    Manual therapy comments elongation to Rt upper traps and bil cervical paraspinals      Neck Exercises: Stretches   Upper Trapezius Stretch Left;2 reps;20 seconds            Trigger Point Dry Needling - 06/04/20 0001    Consent Given? Yes    Education Handout Provided Yes    Muscles Treated Head and Neck Upper trapezius;Cervical  multifidi   lateral Rt away from lipoma, bil multifidi   Upper Trapezius Response Twitch reponse elicited;Palpable increased muscle length    Cervical multifidi Response Twitch reponse elicited;Palpable increased muscle length                PT Education - 06/04/20 1117    Education Details Access Code: [email or web address], DN info    Person(s) Educated Patient    Methods Demonstration;Handout;Explanation    Comprehension Verbalized understanding;Returned demonstration            PT Short Term Goals - 05/28/20 1738      PT SHORT TERM GOAL #1   Title The patient will demonstrate understanding of basic self care strategies including sitting postural alignment, left sidebending for symptom control, basic ex to prevent further strength loss    Time 4    Period Weeks    Status New    Target Date 06/25/20      PT SHORT TERM GOAL #2   Title The patient will report a 25% improvement in right neck and UE symptoms with ADLs including cooking and sleeping    Time 4    Period Weeks    Status New      PT SHORT TERM GOAL #3   Title The patient will have improved cervical flexion to 40 degrees and bil rotation to 45 degrees needed for driving    Time 4    Period Weeks    Status New             PT Long Term Goals - 05/28/20 1741      PT LONG TERM GOAL #1   Title The patient will be independent with safe self progression of HEP    Time 12    Period Weeks    Status New    Target Date 08/20/20      PT LONG TERM GOAL #2   Title The patient will report a 50% improvement in right neck and UE symptoms with usual ADLs    Time 12    Period Weeks    Status New      PT LONG TERM GOAL #3   Title The patient will have grossly 4+/5 right UE strength  and grip on right to 50# needed for cooking/ lifting things in the kitchen    Time 12    Period Weeks    Status New      PT LONG TERM GOAL #4  Title Cervical sidebending to right and left to 40 degrees and extension to 40 degrees needed for  ADLs including taking care of grandson    Time 12    Period Weeks    Status New      PT LONG TERM GOAL #5   Title FOTO functional outcome score improved from 36% limitation to 33%    Time 12    Period Weeks    Status New                 Plan - 06/04/20 1144    Clinical Impression Statement Pt with first time follow-up after evaluation today.  Pt reports reduction in symptoms with Lt cervical sidebending.  PT added radial nerve glides to HEP and pt reported reduction of Rt UE pain after performing these.  Pt responded well to dry needling with multiple twitch responses on the Rt and demonstrated improved tissue mobility after manual therapy today.  Trial of traction today to address Rt UE pain.  Pt will continue to benefit from skilled PT to address Rt UE radiculopathy, Rt UE strength and posture.    PT Frequency 1x / week    PT Duration 12 weeks    PT Treatment/Interventions ADLs/Self Care Home Management;Cryotherapy;Electrical Stimulation;Ultrasound;Traction;Moist Heat;Iontophoresis 4mg /ml Dexamethasone;Therapeutic activities;Therapeutic exercise;Neuromuscular re-education;Manual techniques;Patient/family education;Taping;Dry needling    PT Next Visit Plan continue dry needling and traction if helpful.  Add Rt UE strength exercises, postural strength, review HEP    Consulted and Agree with Plan of Care Patient           Patient will benefit from skilled therapeutic intervention in order to improve the following deficits and impairments:  Decreased range of motion, Increased fascial restricitons, Pain, Decreased strength, Impaired perceived functional ability  Visit Diagnosis: Radiculopathy, cervical region  Muscle weakness (generalized)     Problem List Patient Active Problem List   Diagnosis Date Noted  . Mild persistent asthma without complication 50/56/9794  . Mixed rhinitis 06/10/2018  . Obstructive sleep apnea 12/11/2016  . Palpitations 06/17/2016  . Deflected  nasal septum 09/13/2015  . Hypertrophy of nasal turbinates 09/13/2015  . Deviated nasal septum 06/21/2015    Class: Chronic  . HYPERLIPIDEMIA 11/18/2007  . Essential hypertension 11/18/2007  . Allergic rhinitis 11/18/2007  . COUGH 11/18/2007     Sigurd Sos, PT 06/04/20 11:54 AM  [facility] [address] [place], [place], [address] Phone: [phone]   Fax:  [phone]  Name: KAYDON CREEDON MRN: [id number] Date of Birth: Jan 11, 1951

## 2020-06-04 NOTE — Patient Instructions (Addendum)
Trigger Point Dry Needling  . What is Trigger Point Dry Needling (DN)? o DN is a physical therapy technique used to treat muscle pain and dysfunction. Specifically, DN helps deactivate muscle trigger points (muscle knots).  o A thin filiform needle is used to penetrate the skin and stimulate the underlying trigger point. The goal is for a local twitch response (LTR) to occur and for the trigger point to relax. No medication of any kind is injected during the procedure.   . What Does Trigger Point Dry Needling Feel Like?  o The procedure feels different for each individual patient. Some patients report that they do not actually feel the needle enter the skin and overall the process is not painful. Very mild bleeding may occur. However, many patients feel a deep cramping in the muscle in which the needle was inserted. This is the local twitch response.   Marland Kitchen How Will I feel after the treatment? o Soreness is normal, and the onset of soreness may not occur for a few hours. Typically this soreness does not last longer than two days.  o Bruising is uncommon, however; ice can be used to decrease any possible bruising.  o In rare cases feeling tired or nauseous after the treatment is normal. In addition, your symptoms may get worse before they get better, this period will typically not last longer than 24 hours.   . What Can I do After My Treatment? o Increase your hydration by drinking more water for the next 24 hours. o You may place ice or heat on the areas treated that have become sore, however, do not use heat on inflamed or bruised areas. Heat often brings more relief post needling. o You can continue your regular activities, but vigorous activity is not recommended initially after the treatment for 24 hours. o DN is best combined with other physical therapy such as strengthening, stretching, and other therapies.   Access Code: [email or web address] URL: [email or web address] Date:  06/04/2020 Prepared by: Claiborne Billings  Exercises Standing Radial Nerve Glide - 3-4 x daily - 7 x weekly - 1 sets - 5 reps Seated Cervical Sidebending AROM - 3 x daily - 7 x weekly - 1 sets - 3 reps - 10-20 hold  [facility] [address], [place], [place] [address] Phone # [phone] Fax [phone]

## 2020-06-07 DIAGNOSIS — D170 Benign lipomatous neoplasm of skin and subcutaneous tissue of head, face and neck: Secondary | ICD-10-CM | POA: Diagnosis not present

## 2020-06-07 DIAGNOSIS — M5412 Radiculopathy, cervical region: Secondary | ICD-10-CM | POA: Diagnosis not present

## 2020-06-13 ENCOUNTER — Ambulatory Visit: Payer: Medicare HMO | Admitting: Physical Therapy

## 2020-06-14 ENCOUNTER — Ambulatory Visit: Payer: Medicare HMO | Admitting: Allergy

## 2020-06-18 DIAGNOSIS — G4733 Obstructive sleep apnea (adult) (pediatric): Secondary | ICD-10-CM | POA: Diagnosis not present

## 2020-06-20 ENCOUNTER — Other Ambulatory Visit: Payer: Self-pay

## 2020-06-20 ENCOUNTER — Ambulatory Visit: Payer: Medicare HMO | Attending: Family Medicine | Admitting: Physical Therapy

## 2020-06-20 DIAGNOSIS — M5412 Radiculopathy, cervical region: Secondary | ICD-10-CM | POA: Diagnosis not present

## 2020-06-20 DIAGNOSIS — M6281 Muscle weakness (generalized): Secondary | ICD-10-CM | POA: Diagnosis not present

## 2020-06-20 NOTE — Therapy (Signed)
[facility] [address] [place], [place], [address] Phone: [phone]   Fax:  [phone]  Physical Therapy Treatment  Patient Details  Name: [name] MRN: [id number] Date of Birth: 1951/07/05 Referring Provider (PT): Dr. London Pepper   Encounter Date: 06/20/2020   PT End of Session - 06/20/20 2120    Visit Number 3    Date for PT Re-Evaluation 08/20/20    Authorization Type Aetna Medicare    PT Start Time 7564    PT Stop Time 1105    PT Time Calculation (min) 50 min    Activity Tolerance Patient tolerated treatment well           Past Medical History:  Diagnosis Date  . Asthma   . Chronic back pain   . Diabetes mellitus without complication ([place])    no med  . GERD (gastroesophageal reflux disease)   . Hyperlipemia   . Hypertension   . Hypothyroidism   . Insomnia   . OAB (overactive bladder)   . Seasonal allergies   . Sleep apnea    uses a cpap    Past Surgical History:  Procedure Laterality Date  . ANKLE ARTHROTOMY  2003   left-fx  . APPENDECTOMY    . back fusion  02/14/2019   L4 back fusion  . BACK SURGERY  2010   lumb disk  . BREAST LUMPECTOMY WITH RADIOACTIVE SEED LOCALIZATION Left 06/13/2019   Procedure: LEFT BREAST LUMPECTOMY WITH RADIOACTIVE SEED LOCALIZATION;  Surgeon: Erroll Luna, MD;  Location: [place];  Service: General;  Laterality: Left;  . CARPAL TUNNEL RELEASE     rt  . CARPAL TUNNEL RELEASE Left 08/01/2013   Procedure: LEFT CARPAL TUNNEL RELEASE;  Surgeon: Wynonia Sours, MD;  Location: [place];  Service: Orthopedics;  Laterality: Left;  . CESAREAN SECTION  83,86  . COLONOSCOPY    . DILATATION & CURETTAGE/HYSTEROSCOPY WITH MYOSURE N/A 08/23/2018   Procedure: DILATATION & CURETTAGE/HYSTEROSCOPY WITH MYOSURE POLYPECTOMY;  Surgeon: Nunzio Cobbs, MD;  Location: [facility];  Service: Gynecology;  Laterality:  N/A;  fibroid resection  . DILATION AND CURETTAGE OF UTERUS     x2  . MASS EXCISION N/A 08/04/2017   Procedure: EXCISION LIPOMA UPPER BACK x2 AND NECK;  Surgeon: Erroll Luna, MD;  Location: [place];  Service: General;  Laterality: N/A;  . NASAL SEPTOPLASTY W/ TURBINOPLASTY Bilateral 06/21/2015   Procedure: NASAL SEPTOPLASTY WITH BILATERAL TURBINATE REDUCTION;  Surgeon: Jerrell Belfast, MD;  Location: [place];  Service: ENT;  Laterality: Bilateral;  . SINOSCOPY    . TONSILLECTOMY    . TUBAL LIGATION      There were no vitals filed for this visit.   Subjective Assessment - 06/20/20 1020    Subjective I think the traction helped.  The nerve glide helps some.    I missed my appt last week b/c my brother had a stroke and I had to drove to [facility].  The doctor thinks the lipoma has nothing to do with this problem.    Pertinent History lumbar fusion 2020;  lipomas right upper trap region;  bil Carpal tunnel release;  PT for neck in 2014 but resolved (no UE symptoms with that)    Currently in Pain? Yes    Pain Score 5     Pain Orientation Right    Pain Radiating Towards right upper arm all the way down to the  thumb    Aggravating Factors  sitting in the car; stress    Pain Relieving Factors bending head to the left                             [facility] Adult PT Treatment/Exercise - 06/20/20 0001      Traction   Type of Traction Cervical    Min (lbs) 8    Max (lbs) 15    Hold Time 60    Rest Time 10    Time 12      Manual Therapy   Manual therapy comments elongation to Rt upper traps and bil cervical paraspinals    Joint Mobilization T1-C4 PA grade 1/2 mobs; right lateral glides C4-C7    Neural Stretch single component neural mobility: scapular/shoulder depression, elbow flexion/extension, forearm pronation/supination; wrist flexion/extension 10x each            Trigger Point Dry Needling - 06/20/20 0001    Consent Given? Yes    Upper Trapezius  Response Twitch reponse elicited;Palpable increased muscle length    Cervical multifidi Response Twitch reponse elicited;Palpable increased muscle length                  PT Short Term Goals - 05/28/20 1738      PT SHORT TERM GOAL #1   Title The patient will demonstrate understanding of basic self care strategies including sitting postural alignment, left sidebending for symptom control, basic ex to prevent further strength loss    Time 4    Period Weeks    Status New    Target Date 06/25/20      PT SHORT TERM GOAL #2   Title The patient will report a 25% improvement in right neck and UE symptoms with ADLs including cooking and sleeping    Time 4    Period Weeks    Status New      PT SHORT TERM GOAL #3   Title The patient will have improved cervical flexion to 40 degrees and bil rotation to 45 degrees needed for driving    Time 4    Period Weeks    Status New             PT Long Term Goals - 05/28/20 1741      PT LONG TERM GOAL #1   Title The patient will be independent with safe self progression of HEP    Time 12    Period Weeks    Status New    Target Date 08/20/20      PT LONG TERM GOAL #2   Title The patient will report a 50% improvement in right neck and UE symptoms with usual ADLs    Time 12    Period Weeks    Status New      PT LONG TERM GOAL #3   Title The patient will have grossly 4+/5 right UE strength  and grip on right to 50# needed for cooking/ lifting things in the kitchen    Time 12    Period Weeks    Status New      PT LONG TERM GOAL #4   Title Cervical sidebending to right and left to 40 degrees and extension to 40 degrees needed for ADLs including taking care of grandson    Time 12    Period Weeks    Status New      PT LONG TERM GOAL #5  Title FOTO functional outcome score improved from 36% limitation to 33%    Time 12    Period Weeks    Status New                 Plan - 06/20/20 2120    Clinical Impression Statement  The patient reports improvement following last treatment session however driving in the car to [place] caused increased muscle tension in right neck.   She also has right UE radicular symptoms  in upper arm to the thumb.  Good response to single joint neural mobility.  Decreased tender points following DN and manual therapy.  Therapist monitoring response with all interventions.    Comorbidities lumbar spinal fusion 2020 with peripheral symptoms    Examination-Participation Restrictions Cleaning;Meal Prep;Laundry    Rehab Potential Good    PT Frequency 1x / week    PT Duration 12 weeks    PT Treatment/Interventions ADLs/Self Care Home Management;Cryotherapy;Electrical Stimulation;Ultrasound;Traction;Moist Heat;Iontophoresis 4mg /ml Dexamethasone;Therapeutic activities;Therapeutic exercise;Neuromuscular re-education;Manual techniques;Patient/family education;Taping;Dry needling    PT Next Visit Plan continue dry needling and traction.  Add Rt UE strength exercises, postural strength, review HEP           Patient will benefit from skilled therapeutic intervention in order to improve the following deficits and impairments:  Decreased range of motion,Increased fascial restricitons,Pain,Decreased strength,Impaired perceived functional ability  Visit Diagnosis: Radiculopathy, cervical region  Muscle weakness (generalized)     Problem List Patient Active Problem List   Diagnosis Date Noted  . Mild persistent asthma without complication 46/65/9935  . Mixed rhinitis 06/10/2018  . Obstructive sleep apnea 12/11/2016  . Palpitations 06/17/2016  . Deflected nasal septum 09/13/2015  . Hypertrophy of nasal turbinates 09/13/2015  . Deviated nasal septum 06/21/2015    Class: Chronic  . HYPERLIPIDEMIA 11/18/2007  . Essential hypertension 11/18/2007  . Allergic rhinitis 11/18/2007  . COUGH 11/18/2007   Ruben Im, PT 06/20/20 9:28 PM Phone: [phone] Fax: [phone] Alvera Singh 06/20/2020, 9:27 PM  [facility] [address] [place], [place], [address] Phone: [phone]   Fax:  [phone]  Name: [name] MRN: [id number] Date of Birth: [birth year]

## 2020-07-02 ENCOUNTER — Other Ambulatory Visit: Payer: Self-pay

## 2020-07-02 ENCOUNTER — Encounter: Payer: Self-pay | Admitting: Physical Therapy

## 2020-07-02 ENCOUNTER — Ambulatory Visit: Payer: Medicare HMO | Admitting: Physical Therapy

## 2020-07-02 DIAGNOSIS — M6281 Muscle weakness (generalized): Secondary | ICD-10-CM | POA: Diagnosis not present

## 2020-07-02 DIAGNOSIS — M5412 Radiculopathy, cervical region: Secondary | ICD-10-CM | POA: Diagnosis not present

## 2020-07-02 NOTE — Therapy (Signed)
[facility] [address], [address] [place], [place], [address] Phone: [phone]   Fax:  [phone]  Physical Therapy Treatment  Patient Details  Name: [name] MRN: [id number] Date of Birth: 05-27-1951 Referring Provider (PT): Dr. Farris Has   Encounter Date: 07/02/2020   PT End of Session - 07/02/20 1358    Visit Number 4    Date for PT Re-Evaluation 08/20/20    Authorization Type [employer] Medicare    PT Start Time 1400    PT Stop Time 1445    PT Time Calculation (min) 45 min    Activity Tolerance Patient tolerated treatment well    Behavior During Therapy [facility] for tasks assessed/performed           Past Medical History:  Diagnosis Date  . Asthma   . Chronic back pain   . Diabetes mellitus without complication (HCC)    no med  . GERD (gastroesophageal reflux disease)   . Hyperlipemia   . Hypertension   . Hypothyroidism   . Insomnia   . OAB (overactive bladder)   . Seasonal allergies   . Sleep apnea    uses a cpap    Past Surgical History:  Procedure Laterality Date  . ANKLE ARTHROTOMY  2003   left-fx  . APPENDECTOMY    . back fusion  02/14/2019   L4 back fusion  . BACK SURGERY  2010   lumb disk  . BREAST LUMPECTOMY WITH RADIOACTIVE SEED LOCALIZATION Left 06/13/2019   Procedure: LEFT BREAST LUMPECTOMY WITH RADIOACTIVE SEED LOCALIZATION;  Surgeon: Harriette Bouillon, MD;  Location: [facility];  Service: General;  Laterality: Left;  . CARPAL TUNNEL RELEASE     rt  . CARPAL TUNNEL RELEASE Left 08/01/2013   Procedure: LEFT CARPAL TUNNEL RELEASE;  Surgeon: Nicki Reaper, MD;  Location: [facility];  Service: Orthopedics;  Laterality: Left;  . CESAREAN SECTION  83,86  . COLONOSCOPY    . DILATATION & CURETTAGE/HYSTEROSCOPY WITH MYOSURE N/A 08/23/2018   Procedure: DILATATION & CURETTAGE/HYSTEROSCOPY WITH MYOSURE POLYPECTOMY;  Surgeon: Patton Salles, MD;  Location:  [facility];  Service: Gynecology;  Laterality: N/A;  fibroid resection  . DILATION AND CURETTAGE OF UTERUS     x2  . MASS EXCISION N/A 08/04/2017   Procedure: EXCISION LIPOMA UPPER BACK x2 AND NECK;  Surgeon: Harriette Bouillon, MD;  Location: [facility];  Service: General;  Laterality: N/A;  . NASAL SEPTOPLASTY W/ TURBINOPLASTY Bilateral 06/21/2015   Procedure: NASAL SEPTOPLASTY WITH BILATERAL TURBINATE REDUCTION;  Surgeon: Osborn Coho, MD;  Location: [facility] OR;  Service: ENT;  Laterality: Bilateral;  . SINOSCOPY    . TONSILLECTOMY    . TUBAL LIGATION      There were no vitals filed for this visit.   Subjective Assessment - 07/02/20 1358    Subjective Driving makes me worse and I had to travel last week.  The DN and traction have been most helpful.  I'm trying to do the exercises.  I continue to have very bothersome pain into Rt arm to thumb.    Pertinent History lumbar fusion 2020;  lipomas right upper trap region;  bil Carpal tunnel release;  PT for neck in 2014 but resolved (no UE symptoms with that)    Limitations Sitting    Diagnostic tests none    Patient Stated Goals not have this pain and feeling in right arm; strategies to improve that  Currently in Pain? Yes    Pain Score 4     Pain Location Arm    Pain Orientation Right    Pain Descriptors / Indicators Pins and needles    Pain Type Chronic pain    Pain Onset More than a month ago    Pain Frequency Intermittent    Aggravating Factors  driving, sitting    Pain Relieving Factors bending head to Lt                             [facility] Adult PT Treatment/Exercise - 07/02/20 0001      Exercises   Exercises Neck;Shoulder;Hand;Elbow      Elbow Exercises   Elbow Extension Strengthening;Right;Standing;Theraband;10 reps    Theraband Level (Elbow Extension) Level 1 (Yellow)      Shoulder Exercises: Standing   Extension Strengthening;Right;Theraband;10 reps    Theraband Level  (Shoulder Extension) Level 1 (Yellow)    Row Strengthening;Both;10 reps;Theraband      Hand Exercises   MCPJ Extension Limitations rubber band around all 5 fingers and spread apart x 20 reps    Digiticizer 3.0lb and 5.0lb x 1' each, better in standing than sitting for pain tolerance secondary to posture improved in standing    Other Hand Exercises towel roll squeeze 10x3 sec holds Rt      Traction   Type of Traction Cervical    Min (lbs) 8    Max (lbs) 15    Hold Time 60    Rest Time 10    Time 12      Manual Therapy   Manual Therapy Soft tissue mobilization;Joint mobilization    Manual therapy comments elongation to Rt upper traps and bil cervical paraspinals    Joint Mobilization C7-T2 PAs bil Gr II/III            Trigger Point Dry Needling - 07/02/20 0001    Consent Given? Yes    Education Handout Provided Previously provided    Muscles Treated Head and Neck Upper trapezius;Cervical multifidi    Dry Needling Comments Rt, C5-C7    Upper Trapezius Response Twitch reponse elicited;Palpable increased muscle length    Cervical multifidi Response Twitch reponse elicited;Palpable increased muscle length                PT Education - 07/02/20 1428    Education Details added grip, thumb and Rt shoulder yellow tband to HEP    Person(s) Educated Patient    Methods Explanation;Handout;Demonstration    Comprehension Verbalized understanding;Returned demonstration            PT Short Term Goals - 05/28/20 1738      PT SHORT TERM GOAL #1   Title The patient will demonstrate understanding of basic self care strategies including sitting postural alignment, left sidebending for symptom control, basic ex to prevent further strength loss    Time 4    Period Weeks    Status New    Target Date 06/25/20      PT SHORT TERM GOAL #2   Title The patient will report a 25% improvement in right neck and UE symptoms with ADLs including cooking and sleeping    Time 4    Period Weeks     Status New      PT SHORT TERM GOAL #3   Title The patient will have improved cervical flexion to 40 degrees and bil rotation to 45 degrees needed for driving  Time 4    Period Weeks    Status New             PT Long Term Goals - 05/28/20 1741      PT LONG TERM GOAL #1   Title The patient will be independent with safe self progression of HEP    Time 12    Period Weeks    Status New    Target Date 08/20/20      PT LONG TERM GOAL #2   Title The patient will report a 50% improvement in right neck and UE symptoms with usual ADLs    Time 12    Period Weeks    Status New      PT LONG TERM GOAL #3   Title The patient will have grossly 4+/5 right UE strength  and grip on right to 50# needed for cooking/ lifting things in the kitchen    Time 12    Period Weeks    Status New      PT LONG TERM GOAL #4   Title Cervical sidebending to right and left to 40 degrees and extension to 40 degrees needed for ADLs including taking care of grandson    Time 12    Period Weeks    Status New      PT LONG TERM GOAL #5   Title FOTO functional outcome score improved from 36% limitation to 33%    Time 12    Period Weeks    Status New                 Plan - 07/02/20 1429    Clinical Impression Statement Pt continues to have Rt UE radicular and neck pain.  She states traction and DN have helped although driving aggravates symptoms again.  She had to do two driving trips last week.  She arrived with mostly arm > neck pain of 4/10.  She was able to tolerate new ther ex for thumb, grip and Rt shoulder postural strenghening without exacerbation of pain.  HEP was updated to reflect these exercises.  Pt has signif forward head and rounded shoulders with exacerbation of symptoms with attempts to retract neck into improved posture.  PT repeated cervical traction from last visit with gives Pt Rt UE relief of 80% and neck pain relief of 50%.  PT performed DN to Rt upper trap and C5-C7 multifidi  on Rt with good twitch and release.  Pt reported 0/10 Rt UE pain end of session and just soreness with improved tension in neck 1/10 end of session.  Continue along POC.    Comorbidities lumbar spinal fusion 2020 with peripheral symptoms    Rehab Potential Good    PT Frequency 1x / week    PT Duration 12 weeks    PT Treatment/Interventions ADLs/Self Care Home Management;Cryotherapy;Electrical Stimulation;Ultrasound;Traction;Moist Heat;Iontophoresis 4mg /ml Dexamethasone;Therapeutic activities;Therapeutic exercise;Neuromuscular re-education;Manual techniques;Patient/family education;Taping;Dry needling    PT Next Visit Plan continue DN and traction, review new HEP added last visit, postural cues as tolerated    PT Home Exercise Plan [email or web address]    Consulted and Agree with Plan of Care Patient           Patient will benefit from skilled therapeutic intervention in order to improve the following deficits and impairments:     Visit Diagnosis: Radiculopathy, cervical region  Muscle weakness (generalized)     Problem List Patient Active Problem List   Diagnosis Date Noted  . Mild persistent asthma without complication 99991111  .  Mixed rhinitis 06/10/2018  . Obstructive sleep apnea 12/11/2016  . Palpitations 06/17/2016  . Deflected nasal septum 09/13/2015  . Hypertrophy of nasal turbinates 09/13/2015  . Deviated nasal septum 06/21/2015    Class: Chronic  . HYPERLIPIDEMIA 11/18/2007  . Essential hypertension 11/18/2007  . Allergic rhinitis 11/18/2007  . COUGH 11/18/2007    Leean Amezcua, PT 07/02/20 2:49 PM   [facility] [address], [place], [place], [address] Phone: [phone]   Fax:  [phone]  Name: [name] MRN: [id number] Date of Birth: [birth year]

## 2020-07-02 NOTE — Patient Instructions (Signed)
Access Code: [email or web address] URL: [email or web address] Date: 07/02/2020 Prepared by: Loistine Simas Baylon Santelli  Exercises Standing Radial Nerve Glide - 3-4 x daily - 7 x weekly - 1 sets - 5 reps Seated Cervical Sidebending AROM - 3 x daily - 7 x weekly - 1 sets - 3 reps - 10-20 hold Towel Roll Squeeze - 1 x daily - 7 x weekly - 2 sets - 10 reps - 3 hold Rubber Band Spread - 1 x daily - 7 x weekly - 2 sets - 10 reps Single Arm Shoulder Extension with Anchored Resistance - 1 x daily - 7 x weekly - 2 sets - 10 reps Standing Tricep Extensions with Resistance - 1 x daily - 7 x weekly - 2 sets - 10 reps Standing Row with Anchored Resistance - 1 x daily - 7 x weekly - 2 sets - 10 reps

## 2020-07-03 DIAGNOSIS — M542 Cervicalgia: Secondary | ICD-10-CM | POA: Diagnosis not present

## 2020-07-03 DIAGNOSIS — M4327 Fusion of spine, lumbosacral region: Secondary | ICD-10-CM | POA: Diagnosis not present

## 2020-07-03 DIAGNOSIS — M419 Scoliosis, unspecified: Secondary | ICD-10-CM | POA: Diagnosis not present

## 2020-07-08 ENCOUNTER — Encounter: Payer: Self-pay | Admitting: Physical Therapy

## 2020-07-08 ENCOUNTER — Other Ambulatory Visit: Payer: Self-pay

## 2020-07-08 ENCOUNTER — Ambulatory Visit: Payer: Medicare HMO | Attending: Family Medicine | Admitting: Physical Therapy

## 2020-07-08 DIAGNOSIS — M6281 Muscle weakness (generalized): Secondary | ICD-10-CM | POA: Diagnosis not present

## 2020-07-08 DIAGNOSIS — M5412 Radiculopathy, cervical region: Secondary | ICD-10-CM | POA: Diagnosis not present

## 2020-07-08 NOTE — Therapy (Signed)
[facility] [address] [place], [place], [address] Phone: [phone]   Fax:  [phone]  Physical Therapy Treatment  Patient Details  Name: [name] MRN: [id number] Date of Birth: 1950-11-26 Referring Provider (PT): Dr. Farris Has   Encounter Date: 07/08/2020   PT End of Session - 07/08/20 1442    Visit Number 5    Date for PT Re-Evaluation 08/20/20    Authorization Type [employer]na Medicare    Authorization - Visit Number 5    Authorization - Number of Visits 10    PT Start Time 1400    PT Stop Time 1500    PT Time Calculation (min) 60 min    Activity Tolerance Patient tolerated treatment well    Behavior During Therapy [facility] for tasks assessed/performed           Past Medical History:  Diagnosis Date  . Asthma   . Chronic back pain   . Diabetes mellitus without complication (HCC)    no med  . GERD (gastroesophageal reflux disease)   . Hyperlipemia   . Hypertension   . Hypothyroidism   . Insomnia   . OAB (overactive bladder)   . Seasonal allergies   . Sleep apnea    uses a cpap    Past Surgical History:  Procedure Laterality Date  . ANKLE ARTHROTOMY  2003   left-fx  . APPENDECTOMY    . back fusion  02/14/2019   L4 back fusion  . BACK SURGERY  2010   lumb disk  . BREAST LUMPECTOMY WITH RADIOACTIVE SEED LOCALIZATION Left 06/13/2019   Procedure: LEFT BREAST LUMPECTOMY WITH RADIOACTIVE SEED LOCALIZATION;  Surgeon: Harriette Bouillon, MD;  Location: [facility];  Service: General;  Laterality: Left;  . CARPAL TUNNEL RELEASE     rt  . CARPAL TUNNEL RELEASE Left 08/01/2013   Procedure: LEFT CARPAL TUNNEL RELEASE;  Surgeon: Nicki Reaper, MD;  Location: [facility];  Service: Orthopedics;  Laterality: Left;  . CESAREAN SECTION  83,86  . COLONOSCOPY    . DILATATION & CURETTAGE/HYSTEROSCOPY WITH MYOSURE N/A 08/23/2018   Procedure: DILATATION & CURETTAGE/HYSTEROSCOPY WITH  MYOSURE POLYPECTOMY;  Surgeon: Patton Salles, MD;  Location: [facility];  Service: Gynecology;  Laterality: N/A;  fibroid resection  . DILATION AND CURETTAGE OF UTERUS     x2  . MASS EXCISION N/A 08/04/2017   Procedure: EXCISION LIPOMA UPPER BACK x2 AND NECK;  Surgeon: Harriette Bouillon, MD;  [facility];  Service: General;  Laterality: N/A;  . NASAL SEPTOPLASTY W/ TURBINOPLASTY Bilateral 06/21/2015   Procedure: NASAL SEPTOPLASTY WITH BILATERAL TURBINATE REDUCTION;  Surgeon: Osborn Coho, MD;  Location: [facility] OR;  Service: ENT;  Laterality: Bilateral;  . SINOSCOPY    . TONSILLECTOMY    . TUBAL LIGATION      There were no vitals filed for this visit.   Subjective Assessment - 07/08/20 1411    Subjective I feel better since last visit. The PA put me on steriods and they are helping.X-rays were taken and showed degenerative arthritis, bone spur.    Pertinent History lumbar fusion 2020;  lipomas right upper trap region;  bil Carpal tunnel release;  PT for neck in 2014 but resolved (no UE symptoms with that)    Limitations Sitting    Diagnostic tests none    Patient Stated Goals not have this pain and feeling in right arm; strategies to improve that  Currently in Pain? Yes    Pain Score 4     Pain Location Arm    Pain Orientation Right    Pain Descriptors / Indicators Pins and needles    Pain Type Chronic pain    Pain Radiating Towards right upper arm all the way down to the thumb    Pain Onset More than a month ago    Pain Frequency Intermittent    Aggravating Factors  driving, sittting    Pain Relieving Factors bending head to left    Multiple Pain Sites No              [facility] PT Assessment - 07/08/20 0001      Assessment   Medical Diagnosis neck pain, right shoulder pain     Referring Provider (PT) Dr. London Pepper    Onset Date/Surgical Date --   Sept   Hand Dominance Right    Next MD Visit as needed     Prior Therapy 2014  for neck       Strength   Right Shoulder Flexion 4+/5    Right Shoulder ABduction 4/5    Right Elbow Flexion 5/5    Right Elbow Extension 4/5    Right Wrist Flexion 4+/5    Right Wrist Extension 5/5    Right Hand Grip (lbs) 55                         [facility] Adult PT Treatment/Exercise - 07/08/20 0001      Elbow Exercises   Elbow Extension Strengthening;Right;Standing;Theraband;10 reps    Theraband Level (Elbow Extension) Level 1 (Yellow)      Shoulder Exercises: Standing   Extension Strengthening;Right;Theraband;10 reps    Theraband Level (Shoulder Extension) Level 1 (Yellow)    Row Strengthening;Both;10 reps;Theraband      Hand Exercises   MCPJ Extension Limitations rubber band around all 5 fingers and spread apart x 20 reps    Digiticizer 3.0lb and 5.0lb x 1' each, better in standing than sitting for pain tolerance secondary to posture improved in standing    Other Hand Exercises towel roll squeeze 10x3 sec holds Rt   in sitting     Modalities   Modalities Traction      Traction   Type of Traction Cervical    Min (lbs) 8    Max (lbs) 15    Hold Time 60    Rest Time 10    Time 12      Manual Therapy   Manual Therapy Soft tissue mobilization    Manual therapy comments to assess for dry needling    Soft tissue mobilization right cervical paraspinals, right upper trap, right levator scapulae, right cervical paraspinals in prone            Trigger Point Dry Needling - 07/08/20 0001    Consent Given? Yes    Education Handout Provided Previously provided    Muscles Treated Head and Neck Upper trapezius;Cervical multifidi    Dry Needling Comments Rt, C5-C7    Upper Trapezius Response Twitch reponse elicited;Palpable increased muscle length    Cervical multifidi Response Twitch reponse elicited;Palpable increased muscle length                  PT Short Term Goals - 07/08/20 1446      PT SHORT TERM GOAL #1   Title The patient will demonstrate  understanding of basic self care strategies including sitting postural alignment,  left sidebending for symptom control, basic ex to prevent further strength loss    Time 4    Period Weeks    Status On-going      PT SHORT TERM GOAL #2   Title The patient will report a 25% improvement in right neck and UE symptoms with ADLs including cooking and sleeping    Baseline 15% improvement and depends on what she is doing    Time 4    Period Weeks    Status On-going      PT SHORT TERM GOAL #3   Title The patient will have improved cervical flexion to 40 degrees and bil rotation to 45 degrees needed for driving    Period Weeks    Status On-going             PT Long Term Goals - 05/28/20 1741      PT LONG TERM GOAL #1   Title The patient will be independent with safe self progression of HEP    Time 12    Period Weeks    Status New    Target Date 08/20/20      PT LONG TERM GOAL #2   Title The patient will report a 50% improvement in right neck and UE symptoms with usual ADLs    Time 12    Period Weeks    Status New      PT LONG TERM GOAL #3   Title The patient will have grossly 4+/5 right UE strength  and grip on right to 50# needed for cooking/ lifting things in the kitchen    Time 12    Period Weeks    Status New      PT LONG TERM GOAL #4   Title Cervical sidebending to right and left to 40 degrees and extension to 40 degrees needed for ADLs including taking care of grandson    Time 12    Period Weeks    Status New      PT LONG TERM GOAL #5   Title FOTO functional outcome score improved from 36% limitation to 33%    Time 12    Period Weeks    Status New                 Plan - 07/08/20 1443    Clinical Impression Statement Patient is taking a steroid and is helping with her pain. Patient has increased right shoulder flexion, right wrist and grip strength. Patient continues to need postural reminders while doing exericses to keep her cervical spine in neutral and  decrease the pressure on the nerve. Patient will tense up her right shoulder with exercises. Patient will benefit from skilled therapy to work with her strength and advising on postural adjustments and elongate the tight tissue.    Personal Factors and Comorbidities Age;Comorbidity 1;Past/Current Experience    Comorbidities lumbar spinal fusion 2020 with peripheral symptoms    Examination-Activity Limitations Sleep;Carry;Lift    Examination-Participation Restrictions Cleaning;Meal Prep;Laundry    Stability/Clinical Decision Making Stable/Uncomplicated    Rehab Potential Good    PT Frequency 1x / week    PT Duration 12 weeks    PT Treatment/Interventions ADLs/Self Care Home Management;Cryotherapy;Electrical Stimulation;Ultrasound;Traction;Moist Heat;Iontophoresis 4mg /ml Dexamethasone;Therapeutic activities;Therapeutic exercise;Neuromuscular re-education;Manual techniques;Patient/family education;Taping;Dry needling    PT Next Visit Plan continue DN and traction, review new HEP added last visit, postural cues as tolerated; take cervical ROM measurements for goals    PT Home Exercise Plan [email or web address]    Consulted and  Agree with Plan of Care Patient           Patient will benefit from skilled therapeutic intervention in order to improve the following deficits and impairments:  Decreased range of motion,Increased fascial restricitons,Pain,Decreased strength,Impaired perceived functional ability  Visit Diagnosis: Radiculopathy, cervical region  Muscle weakness (generalized)     Problem List Patient Active Problem List   Diagnosis Date Noted  . Mild persistent asthma without complication 99991111  . Mixed rhinitis 06/10/2018  . Obstructive sleep apnea 12/11/2016  . Palpitations 06/17/2016  . Deflected nasal septum 09/13/2015  . Hypertrophy of nasal turbinates 09/13/2015  . Deviated nasal septum 06/21/2015    Class: Chronic  . HYPERLIPIDEMIA 11/18/2007  . Essential hypertension  11/18/2007  . Allergic rhinitis 11/18/2007  . COUGH 11/18/2007    Earlie Counts, PT 07/08/20 2:48 PM   [facility] [address] [place], [place], [address] Phone: [phone]   Fax:  [phone]  Name: NASYAH GOTH MRN: [id number] Date of Birth: 09-Sep-1950

## 2020-07-12 ENCOUNTER — Other Ambulatory Visit: Payer: Self-pay | Admitting: Allergy

## 2020-07-15 ENCOUNTER — Ambulatory Visit: Payer: Medicare HMO

## 2020-07-15 ENCOUNTER — Other Ambulatory Visit: Payer: Self-pay

## 2020-07-15 DIAGNOSIS — M6281 Muscle weakness (generalized): Secondary | ICD-10-CM | POA: Diagnosis not present

## 2020-07-15 DIAGNOSIS — M5412 Radiculopathy, cervical region: Secondary | ICD-10-CM | POA: Diagnosis not present

## 2020-07-15 NOTE — Therapy (Signed)
[facility] [address], [place], [place], [address] Phone: [phone]   Fax:  [phone]  Physical Therapy Treatment  Patient Details  Name: [name] MRN: [id number] Date of Birth: 07-14-1950 Referring Provider (PT): Dr. London Pepper   Encounter Date: 07/15/2020   PT End of Session - 07/15/20 1140    Visit Number 6    Date for PT Re-Evaluation 08/20/20    Authorization Type [employer]    Authorization - Number of Visits 10    PT Start Time 1106    PT Stop Time 1152    PT Time Calculation (min) 46 min    Activity Tolerance Patient tolerated treatment well    Behavior During Therapy [facility] for tasks assessed/performed           Past Medical History:  Diagnosis Date  . Asthma   . Chronic back pain   . Diabetes mellitus without complication ([place])    no med  . GERD (gastroesophageal reflux disease)   . Hyperlipemia   . Hypertension   . Hypothyroidism   . Insomnia   . OAB (overactive bladder)   . Seasonal allergies   . Sleep apnea    uses a cpap    Past Surgical History:  Procedure Laterality Date  . ANKLE ARTHROTOMY  2003   left-fx  . APPENDECTOMY    . back fusion  02/14/2019   L4 back fusion  . BACK SURGERY  2010   lumb disk  . BREAST LUMPECTOMY WITH RADIOACTIVE SEED LOCALIZATION Left 06/13/2019   Procedure: LEFT BREAST LUMPECTOMY WITH RADIOACTIVE SEED LOCALIZATION;  Surgeon: Erroll Luna, MD;  Location: [place];  Service: General;  Laterality: Left;  . CARPAL TUNNEL RELEASE     rt  . CARPAL TUNNEL RELEASE Left 08/01/2013   Procedure: LEFT CARPAL TUNNEL RELEASE;  Surgeon: Wynonia Sours, MD;  Location: [place];  Service: Orthopedics;  Laterality: Left;  . CESAREAN SECTION  83,86  . COLONOSCOPY    . DILATATION & CURETTAGE/HYSTEROSCOPY WITH MYOSURE N/A 08/23/2018   Procedure: DILATATION & CURETTAGE/HYSTEROSCOPY WITH MYOSURE POLYPECTOMY;  Surgeon:  Nunzio Cobbs, MD;  Location: [facility];  Service: Gynecology;  Laterality: N/A;  fibroid resection  . DILATION AND CURETTAGE OF UTERUS     x2  . MASS EXCISION N/A 08/04/2017   Procedure: EXCISION LIPOMA UPPER BACK x2 AND NECK;  Surgeon: Erroll Luna, MD;  Location: [place];  Service: General;  Laterality: N/A;  . NASAL SEPTOPLASTY W/ TURBINOPLASTY Bilateral 06/21/2015   Procedure: NASAL SEPTOPLASTY WITH BILATERAL TURBINATE REDUCTION;  Surgeon: Jerrell Belfast, MD;  Location: [place];  Service: ENT;  Laterality: Bilateral;  . SINOSCOPY    . TONSILLECTOMY    . TUBAL LIGATION      There were no vitals filed for this visit.   Subjective Assessment - 07/15/20 1105    Subjective The steroids helped.  I am better overall.  I feel 20% better.    Patient Stated Goals not have this pain and feeling in right arm; strategies to improve that    Currently in Pain? Yes    Pain Score 2     Pain Location Arm    Pain Orientation Right    Pain Descriptors / Indicators Pins and needles    Pain Type Chronic pain    Pain Onset More than a month ago    Pain Frequency Intermittent  Aggravating Factors  driving, sitting              [facility] PT Assessment - 07/15/20 0001      AROM   Cervical Flexion 45    Cervical - Right Side Bend 30    Cervical - Left Side Bend 35    Cervical - Right Rotation 40    Cervical - Left Rotation 40                         [facility] Adult PT Treatment/Exercise - 07/15/20 0001      Neck Exercises: Machines for Strengthening   UBE (Upper Arm Bike) Level 1x 6 minutes (3/3)- PT present to discuss progress      Shoulder Exercises: Seated   Other Seated Exercises 3 way raises: 1# added 2x10      Hand Exercises   Digiticizer 5# 2x10      Modalities   Modalities Traction      Traction   Type of Traction Cervical    Min (lbs) 8    Max (lbs) 16    Hold Time 60    Rest Time 10    Time 12      Neck  Exercises: Stretches   Upper Trapezius Stretch Left;2 reps;20 seconds                    PT Short Term Goals - 07/15/20 1109      PT SHORT TERM GOAL #1   Title The patient will demonstrate understanding of basic self care strategies including sitting postural alignment, left sidebending for symptom control, basic ex to prevent further strength loss    Status Achieved      PT SHORT TERM GOAL #2   Title The patient will report a 25% improvement in right neck and UE symptoms with ADLs including cooking and sleeping    Baseline 20% improvement    Status On-going      PT SHORT TERM GOAL #3   Title The patient will have improved cervical flexion to 40 degrees and bil rotation to 45 degrees needed for driving    Status On-going             PT Long Term Goals - 05/28/20 1741      PT LONG TERM GOAL #1   Title The patient will be independent with safe self progression of HEP    Time 12    Period Weeks    Status New    Target Date 08/20/20      PT LONG TERM GOAL #2   Title The patient will report a 50% improvement in right neck and UE symptoms with usual ADLs    Time 12    Period Weeks    Status New      PT LONG TERM GOAL #3   Title The patient will have grossly 4+/5 right UE strength  and grip on right to 50# needed for cooking/ lifting things in the kitchen    Time 12    Period Weeks    Status New      PT LONG TERM GOAL #4   Title Cervical sidebending to right and left to 40 degrees and extension to 40 degrees needed for ADLs including taking care of grandson    Time 12    Period Weeks    Status New      PT LONG TERM GOAL #5   Title FOTO functional  outcome score improved from 36% limitation to 33%    Time 12    Period Weeks    Status New                 Plan - 07/15/20 1135    Clinical Impression Statement Pt had some reduction in symptoms when taking an oral steroid last week.  Pt reports 20% reduction in symptoms since the start of care. Patient  has increased right shoulder flexion, right wrist and grip strength since the start of care. Patient continues to need postural reminders while doing exercises to keep her cervical spine in neutral and decrease the pressure on the nerve. Pt is responding well to traction and this was increased to 16# today.  Pt will continue to benefit from skilled PT to address Rt UE pain and weakness.    PT Frequency 1x / week    PT Duration 12 weeks    PT Treatment/Interventions ADLs/Self Care Home Management;Cryotherapy;Electrical Stimulation;Ultrasound;Traction;Moist Heat;Iontophoresis 4mg /ml Dexamethasone;Therapeutic activities;Therapeutic exercise;Neuromuscular re-education;Manual techniques;Patient/family education;Taping;Dry needling    PT Next Visit Plan continue DN and traction,  postural cues as tolerated;    PT Home Exercise Plan [id number]    Consulted and Agree with Plan of Care Patient           Patient will benefit from skilled therapeutic intervention in order to improve the following deficits and impairments:  Decreased range of motion,Increased fascial restricitons,Pain,Decreased strength,Impaired perceived functional ability  Visit Diagnosis: Radiculopathy, cervical region  Muscle weakness (generalized)     Problem List Patient Active Problem List   Diagnosis Date Noted  . Mild persistent asthma without complication 50/93/2671  . Mixed rhinitis 06/10/2018  . Obstructive sleep apnea 12/11/2016  . Palpitations 06/17/2016  . Deflected nasal septum 09/13/2015  . Hypertrophy of nasal turbinates 09/13/2015  . Deviated nasal septum 06/21/2015    Class: Chronic  . HYPERLIPIDEMIA 11/18/2007  . Essential hypertension 11/18/2007  . Allergic rhinitis 11/18/2007  . COUGH 11/18/2007    Sigurd Sos, PT 07/15/20 11:42 AM  [facility] [address] [place], [place], [address] Phone: [phone]   Fax:  [phone]  Name:  [name] MRN: [id number] Date of Birth: 09-Nov-1950

## 2020-07-16 ENCOUNTER — Ambulatory Visit (INDEPENDENT_AMBULATORY_CARE_PROVIDER_SITE_OTHER): Payer: Medicare HMO | Admitting: Obstetrics and Gynecology

## 2020-07-16 ENCOUNTER — Encounter: Payer: Self-pay | Admitting: Obstetrics and Gynecology

## 2020-07-16 ENCOUNTER — Other Ambulatory Visit (HOSPITAL_COMMUNITY)
Admission: RE | Admit: 2020-07-16 | Discharge: 2020-07-16 | Disposition: A | Discharge status: Home / Self Care | Payer: Medicare HMO | Source: Ambulatory Visit | Attending: Obstetrics and Gynecology | Admitting: Obstetrics and Gynecology

## 2020-07-16 VITALS — BP 134/68 | HR 72 | Ht 66.0 in | Wt 229.0 lb

## 2020-07-16 DIAGNOSIS — Z124 Encounter for screening for malignant neoplasm of cervix: Secondary | ICD-10-CM | POA: Diagnosis not present

## 2020-07-16 DIAGNOSIS — N952 Postmenopausal atrophic vaginitis: Secondary | ICD-10-CM | POA: Diagnosis not present

## 2020-07-16 DIAGNOSIS — Z1239 Encounter for other screening for malignant neoplasm of breast: Secondary | ICD-10-CM | POA: Diagnosis not present

## 2020-07-16 DIAGNOSIS — Z1289 Encounter for screening for malignant neoplasm of other sites: Secondary | ICD-10-CM | POA: Diagnosis not present

## 2020-07-16 DIAGNOSIS — N3281 Overactive bladder: Secondary | ICD-10-CM

## 2020-07-16 MED ORDER — ESTRADIOL 0.1 MG/GM VA CREA: TOPICAL_CREAM | VAGINAL | 1 refills | Status: DC

## 2020-07-16 NOTE — Progress Notes (Signed)
70 y.o. G21P0012 Married Caucasian female here for screening breast and pelvic exam and for treatment of vaginal atrophy.    No vaginal bleeding.  Some vaginal dryness and using Replens.  Using vaginal estrogen cream.   Doing PT for a pinched nerve in her neck.  Wearing a pad for urinary incontinence.  Taking Trospium for overactive bladder.  No consistent leak with laugh, sneeze, cough.  Taking Zoloft for difficulty sleep and anxiety.   Has a new grandchild.   Received her Covid booster and received her flu vaccine.  Also received her pneumonia and shingles vaccine.   PCP: Maurice Small, MD    Patient's last menstrual period was 07/06/1998 (approximate).           Sexually active: Yes.    The current method of family planning is post menopausal status.    Exercising: Yes.    walks 3-4x/week Smoker:  no  Health Maintenance: Pap: 07-04-18 Neg, 06-01-16 Neg:Neg HR HPV, 04-28-13 Neg History of abnormal Pap:  no MMG: 04-14-19 3D/Mass in Rt.Br., Mass in Lt.Br.--Bil.US reveals suspicious mass in both breasts and US guided bx is rec.--Bxs benign--see Epic.  Had mammogram in October at [facility].  Colonoscopy:  03/2020 polyps;next 3 years BMD: 2018  Result :Normal per patient TDaP:  07-02-17, Feb. 2021.  Gardasil:   no HIV:no Hep C:no Screening Labs:  PCP.   reports that she has never smoked. She has never used smokeless tobacco. She reports current alcohol use. She reports that she does not use drugs.  Past Medical History:  Diagnosis Date  . Asthma   . Chronic back pain   . Diabetes mellitus without complication ([place])    no med  . GERD (gastroesophageal reflux disease)   . Hyperlipemia   . Hypertension   . Hypothyroidism   . Insomnia   . OAB (overactive bladder)   . Seasonal allergies   . Sleep apnea    uses a cpap    Past Surgical History:  Procedure Laterality Date  . ANKLE ARTHROTOMY  2003   left-fx  . APPENDECTOMY    . back fusion  02/14/2019   L4 back fusion  .  BACK SURGERY  2010   lumb disk  . BREAST LUMPECTOMY WITH RADIOACTIVE SEED LOCALIZATION Left 06/13/2019   Procedure: LEFT BREAST LUMPECTOMY WITH RADIOACTIVE SEED LOCALIZATION;  Surgeon: Erroll Luna, MD;  Location: [place];  Service: General;  Laterality: Left;  . CARPAL TUNNEL RELEASE     rt  . CARPAL TUNNEL RELEASE Left 08/01/2013   Procedure: LEFT CARPAL TUNNEL RELEASE;  Surgeon: Wynonia Sours, MD;  Location: [place];  Service: Orthopedics;  Laterality: Left;  . CESAREAN SECTION  83,86  . COLONOSCOPY    . DILATATION & CURETTAGE/HYSTEROSCOPY WITH MYOSURE N/A 08/23/2018   Procedure: DILATATION & CURETTAGE/HYSTEROSCOPY WITH MYOSURE POLYPECTOMY;  Surgeon: Nunzio Cobbs, MD;  Location: [facility];  Service: Gynecology;  Laterality: N/A;  fibroid resection  . DILATION AND CURETTAGE OF UTERUS     x2  . MASS EXCISION N/A 08/04/2017   Procedure: EXCISION LIPOMA UPPER BACK x2 AND NECK;  Surgeon: Erroll Luna, MD;  Location: [place];  Service: General;  Laterality: N/A;  . NASAL SEPTOPLASTY W/ TURBINOPLASTY Bilateral 06/21/2015   Procedure: NASAL SEPTOPLASTY WITH BILATERAL TURBINATE REDUCTION;  Surgeon: Jerrell Belfast, MD;  Location: [place];  Service: ENT;  Laterality: Bilateral;  . SINOSCOPY    . TONSILLECTOMY    .  TUBAL LIGATION      Current Outpatient Medications  Medication Sig Dispense Refill  . Acetaminophen (TYLENOL PO) Take by mouth. Takes 552m qam and 2 tablets qhs    . albuterol (PROAIR HFA) 108 (90 Base) MCG/ACT inhaler Inhale 2 puffs into the lungs every 4 (four) hours as needed for wheezing or shortness of breath. 1 Inhaler 3  . amLODipine (NORVASC) 5 MG tablet Take 5 mg by mouth daily.    .Marland Kitchenazelastine (ASTELIN) 0.1 % nasal spray Place 2 sprays into both nostrils 2 (two) times daily. Use in each nostril as directed 60 mL 5  . baclofen (LIORESAL) 20 MG tablet Take 20 mg by mouth 3 (three) times daily  as needed.    .Marland KitchenCINNAMON PO 2 capsules Orally once a day    . estradiol (ESTRACE) 0.1 MG/GM vaginal cream PLEASE SEE ATTACHED FOR DETAILED DIRECTIONS 42.5 g 0  . famotidine-calcium carbonate-magnesium hydroxide (PEPCID COMPLETE) 10-800-165 MG chewable tablet 1 tablet Orally Twice a day    . FLOVENT HFA 110 MCG/ACT inhaler TAKE 2 PUFFS BY MOUTH TWICE A DAY 12 g 5  . gabapentin (NEURONTIN) 400 MG capsule Take 1 capsule by mouth 4 (four) times daily.    .Marland KitchenGLUCOSAMINE-CHONDROITIN PO Take 1 tablet by mouth daily. Move Free Ultra Triple Action    . glucose blood test strip as directed finger stick once a day for 90 days    . hydrochlorothiazide (MICROZIDE) 12.5 MG capsule Take 12.5 mg by mouth daily.    . hydrocortisone-pramoxine (PROCTOFOAM-HC) rectal foam 1 application as needed Rectal Four times a day if needed for hemorrhoid for 7 days    . levocetirizine (XYZAL) 5 MG tablet 1 tablet in the evening    . levothyroxine (SYNTHROID) 112 MCG tablet 1 tablet on an empty stomach in the morning    . losartan (COZAAR) 100 MG tablet Take 100 mg by mouth daily.    . Melatonin 3 MG TABS Take 3 mg by mouth as needed.    . meloxicam (MOBIC) 7.5 MG tablet 1 tablet    . metFORMIN (GLUCOPHAGE-XR) 500 MG 24 hr tablet 1 tablet with evening meal Orally Once a day for 90 days    . montelukast (SINGULAIR) 10 MG tablet Take 10 mg by mouth at bedtime.    . Multiple Vitamin (MULTIVITAMIN WITH MINERALS) TABS tablet Take 1 tablet by mouth at bedtime.    . Omega-3 Fatty Acids (FISH OIL) 1200 MG CAPS Take 1,200 mg by mouth daily.    . sertraline (ZOLOFT) 50 MG tablet Take 50 mg by mouth daily.    . simvastatin (ZOCOR) 20 MG tablet Take 20 mg by mouth at bedtime.     . Trospium Chloride 60 MG CP24 Take 1 capsule by mouth every morning.    . Turmeric 500 MG CAPS 2 capsules Orally once a day    . zolpidem (AMBIEN) 10 MG tablet Take 5 mg by mouth at bedtime as needed for sleep.      No current facility-administered  medications for this visit.    Family History  Problem Relation Age of Onset  . Allergic rhinitis Father   . Allergic rhinitis Sister   . Asthma Sister   . Uterine cancer Sister 630 . Asthma Paternal Aunt   . Multiple myeloma Sister   . Angioedema Neg Hx   . Eczema Neg Hx   . Immunodeficiency Neg Hx   . Urticaria Neg Hx     Review of  Systems  All other systems reviewed and are negative.   Exam:   BP 134/68 (Cuff Size: Large)   Pulse 72   Ht '5\' 6"'  (1.676 m)   Wt 229 lb (103.9 kg)   LMP 07/06/1998 (Approximate)   SpO2 98%   BMI 36.96 kg/m     General appearance: alert, cooperative and appears stated age Head: normocephalic, without obvious abnormality, atraumatic Neck: no adenopathy, supple, symmetrical, trachea midline and thyroid normal to inspection and palpation Lungs: clear to auscultation bilaterally Breasts: normal appearance, no masses or tenderness, No nipple retraction or dimpling, No nipple discharge or bleeding, No axillary adenopathy Heart: regular rate and rhythm Abdomen: soft, non-tender; no masses, no organomegaly Extremities: extremities normal, atraumatic, no cyanosis or edema Skin: skin color, texture, turgor normal. No rashes or lesions Lymph nodes: cervical, supraclavicular, and axillary nodes normal. Neurologic: grossly normal  Pelvic: External genitalia:  no lesions              No abnormal inguinal nodes palpated.              Urethra:  normal appearing urethra with no masses, tenderness or lesions              Bartholins and Skenes: normal                 Vagina: normal appearing vagina with normal color and discharge, atrophy noted with placement of speculum. Petechiae.               Cervix: no lesions              Pap taken: Yes.   Bimanual Exam:  Uterus:  normal size, contour, position, consistency, mobility, non-tender              Adnexa: no mass, fullness, tenderness              Rectal exam: Yes.  .  Confirms.              Anus:   normal sphincter tone, no lesions  Chaperone was present for exam.  Assessment:   Encounter for screening breast and pelvic exam. Status post left breast lumpectomy for intraductal papilloma, fibroadenoma, and fibrocystic change with sclerosing adenosis. Status post hysteroscopy with polypectomy, dilation and curettage. FH uterine cancer.  Hx vaginal cyst. Vaginal atrophy. Overactive bladder. Using Trospium.    Plan: Mammogram screening discussed.  Will get copy of latest report.  Self breast awareness reviewed. Pap and HR HPV as above. Guidelines for Calcium, Vitamin D, regular exercise program including cardiovascular and weight bearing exercise. Refill of vaginal estrogen cream.  We discussed potential pelvic floor therapy.  Bladder irritants reviewed.  Follow up annually and prn.   33 min  total time was spent for this patient encounter, including preparation, face-to-face counseling with the patient, coordination of care, and documentation of the encounter.

## 2020-07-17 ENCOUNTER — Encounter: Payer: Self-pay | Admitting: Allergy

## 2020-07-17 ENCOUNTER — Ambulatory Visit: Payer: Medicare HMO | Admitting: Allergy

## 2020-07-17 ENCOUNTER — Other Ambulatory Visit: Payer: Self-pay

## 2020-07-17 VITALS — BP 118/76 | HR 80 | Temp 98.4°F | Resp 18 | Ht 66.0 in | Wt 228.2 lb

## 2020-07-17 DIAGNOSIS — G4733 Obstructive sleep apnea (adult) (pediatric): Secondary | ICD-10-CM | POA: Diagnosis not present

## 2020-07-17 DIAGNOSIS — J4530 Mild persistent asthma, uncomplicated: Secondary | ICD-10-CM | POA: Diagnosis not present

## 2020-07-17 DIAGNOSIS — J310 Chronic rhinitis: Secondary | ICD-10-CM

## 2020-07-17 MED ORDER — ALBUTEROL SULFATE HFA 108 (90 BASE) MCG/ACT IN AERS: 2.0000 | INHALATION_SPRAY | RESPIRATORY_TRACT | 3 refills | Status: DC | PRN

## 2020-07-17 MED ORDER — AZELASTINE HCL 0.1 % NA SOLN: 2.0000 | Freq: Two times a day (BID) | NASAL | 5 refills | Status: DC

## 2020-07-17 MED ORDER — LEVOCETIRIZINE DIHYDROCHLORIDE 5 MG PO TABS: ORAL_TABLET | ORAL | 5 refills | Status: DC

## 2020-07-17 MED ORDER — FLOVENT HFA 110 MCG/ACT IN AERO: 1.0000 | INHALATION_SPRAY | Freq: Every day | RESPIRATORY_TRACT | 5 refills | Status: DC

## 2020-07-17 NOTE — Progress Notes (Signed)
Follow-up Note  RE: [name] MRN: [id number] DOB: March 31, 1951 Date of Office Visit: 07/17/2020   History of present illness: [name] is a 70 y.o. female presenting today for follow-up of asthma and mixed rhinitis.  She was last seen in the office on 12/14/2019 by myself.  She states she has been doing relatively well since the last visit without any major health changes, surgeries or hospitalizations.  She states her asthma has been under good control.  She does state last week she had a dry cough that woke her up but she attributes this to turning on the heat.  She did not need to use her albuterol at this time.  She states that is really the only symptom she has had since the last visit.  She has been doing Flovent 110 mcg 2 puffs in the morning.  She also continues to take Singulair daily.  She has not had any ED or urgent care visits or systemic steroids. She states after the last visit she did try Xyzal and use for about a month and then stopped use and after couple of days noted that she was having more sneezing.  But she has been taking it daily at this time.  She uses the Flonase more as needed.  She has been using Astelin daily. She continues to use CPAP for apnea. She is not sure if she got a bug bite but as she noted a bump on the left cheek area that she states was a bit bigger and redder and looked rather purulent.  She states it has been a bit painful.  He has been using topical antibiotic.  She does believe that we used some in the bump and redness has improved. She also still notes some occasional left ear pain and states her PCP did mention that it could be related to TMJ issue.     Review of systems: Review of Systems  Constitutional: Negative.   HENT: Negative.   Eyes: Negative.   Respiratory: Positive for cough.   Cardiovascular: Negative.   Gastrointestinal: Negative.   Musculoskeletal: Negative.   Skin: Negative.   Neurological: Negative.     All other  systems negative unless noted above in HPI  Past medical/social/surgical/family history have been reviewed and are unchanged unless specifically indicated below.  No changes  Medication List: Current Outpatient Medications  Medication Sig Dispense Refill  . Acetaminophen (TYLENOL PO) Take by mouth. Takes 500mg  qam and 2 tablets qhs    . amLODipine (NORVASC) 5 MG tablet Take 5 mg by mouth daily.    Marland Kitchen azelastine (ASTELIN) 0.1 % nasal spray Place 2 sprays into both nostrils 2 (two) times daily. Use in each nostril as directed 60 mL 5  . baclofen (LIORESAL) 20 MG tablet Take 20 mg by mouth 3 (three) times daily as needed.    Marland Kitchen CINNAMON PO 2 capsules Orally once a day    . estradiol (ESTRACE) 0.1 MG/GM vaginal cream Place 1/2 gram per vagina at bed time twice weekly. 42.5 g 1  . famotidine-calcium carbonate-magnesium hydroxide (PEPCID COMPLETE) 10-800-165 MG chewable tablet 1 tablet Orally Twice a day    . FLOVENT HFA 110 MCG/ACT inhaler TAKE 2 PUFFS BY MOUTH TWICE A DAY 12 g 5  . gabapentin (NEURONTIN) 400 MG capsule Take 1 capsule by mouth 4 (four) times daily.    Marland Kitchen GLUCOSAMINE-CHONDROITIN PO Take 1 tablet by mouth daily. Move Free Ultra Triple Action    . glucose  blood test strip as directed finger stick once a day for 90 days    . hydrocortisone-pramoxine (PROCTOFOAM-HC) rectal foam 1 application as needed Rectal Four times a day if needed for hemorrhoid for 7 days    . levocetirizine (XYZAL) 5 MG tablet 1 tablet in the evening    . levothyroxine (SYNTHROID) 112 MCG tablet 1 tablet on an empty stomach in the morning    . losartan (COZAAR) 100 MG tablet Take 100 mg by mouth daily.    . Melatonin 3 MG TABS Take 3 mg by mouth as needed.    . meloxicam (MOBIC) 7.5 MG tablet 1 tablet    . metFORMIN (GLUCOPHAGE-XR) 500 MG 24 hr tablet 1 tablet with evening meal Orally Once a day for 90 days    . montelukast (SINGULAIR) 10 MG tablet Take 10 mg by mouth at bedtime.    . Multiple Vitamin  (MULTIVITAMIN WITH MINERALS) TABS tablet Take 1 tablet by mouth at bedtime.    . Omega-3 Fatty Acids (FISH OIL) 1200 MG CAPS Take 1,200 mg by mouth daily.    . sertraline (ZOLOFT) 50 MG tablet Take 50 mg by mouth daily.    . simvastatin (ZOCOR) 20 MG tablet Take 20 mg by mouth at bedtime.     . Trospium Chloride 60 MG CP24 Take 1 capsule by mouth every morning.    . Turmeric 500 MG CAPS 2 capsules Orally once a day    . zolpidem (AMBIEN) 10 MG tablet Take 5 mg by mouth at bedtime as needed for sleep.     Marland Kitchen albuterol (PROAIR HFA) 108 (90 Base) MCG/ACT inhaler Inhale 2 puffs into the lungs every 4 (four) hours as needed for wheezing or shortness of breath. (Patient not taking: Reported on 07/17/2020) 1 Inhaler 3  . estradiol (ESTRACE) 0.1 MG/GM vaginal cream See admin instructions.    . hydrochlorothiazide (MICROZIDE) 12.5 MG capsule Take 12.5 mg by mouth daily.     No current facility-administered medications for this visit.     Known medication allergies: Allergies  Allergen Reactions  . Oxycodone-Acetaminophen Nausea Only     Physical examination: Blood pressure 118/76, pulse 80, temperature 98.4 F (36.9 C), resp. rate 18, height 5\' 6"  (1.676 m), weight 228 lb 3.2 oz (103.5 kg), last menstrual period 07/06/1998, SpO2 96 %.  General: Alert, interactive, in no acute distress. HEENT: PERRLA, TMs pearly gray, turbinates non-edematous without discharge, post-pharynx non erythematous. Neck: Supple without lymphadenopathy. Lungs: Clear to auscultation without wheezing, rhonchi or rales. {no increased work of breathing. CV: Normal S1, S2 without murmurs. Abdomen: Nondistended, nontender. Skin: Erythematous papule without fluid of the left upper cheek. Extremities:  No clubbing, cyanosis or edema. Neuro:   Grossly intact.  Diagnositics/Labs: None today  Assessment and plan:   Asthma  - under good control  - will try to decrease down to  Flovent 110 g  1 puff once a day.   If you  note increase in asthma symptoms or not meeting below goals go back to 2 puffs daily.   - asthma action plan if having asthma flare: ake Flovent 2 puffs twice a day   - Continue Singulair 10 mg daily  - have access to albuterol inhaler 2 puffs every 4-6 hours as needed for cough/wheeze/shortness of breath/chest tightness.  May use 15-20 minutes prior to activity.   Monitor frequency of use.    Asthma control goals:   Full participation in all desired activities (may need albuterol before activity)  Albuterol use two time or less a week on average (not counting use with activity)  Cough interfering with sleep two time or less a month  Oral steroids no more than once a year  No hospitalizations   Mixed rhinitis  - Continue Singulair as above  - continue Xyzal 5mg  in evening to help minimize sneezing episodes.  May try to take every other day.   - Continue Flonase 1-2 sprays each nostril daily as needed for congestion.  Use for 1-2 weeks at a time before stopping once symptoms improved.   - Continue Astelin 1-2 sprays twice a day as needed for nasal drainage/post-nasal drip  Follow-up in 6 months or sooner if needed.  I appreciate the opportunity to take part in Jossilyn's care. Please do not hesitate to contact me with questions.  Sincerely,   Prudy Feeler, MD Allergy/Immunology [facility]

## 2020-07-17 NOTE — Patient Instructions (Addendum)
Asthma  - under good control  - will try to decrease down to  Flovent 110 g  1 puff once a day.   If you note increase in asthma symptoms or not meeting below goals go back to 2 puffs daily.   - asthma action plan if having asthma flare: ake Flovent 2 puffs twice a day   - Continue Singulair 10 mg daily  - have access to albuterol inhaler 2 puffs every 4-6 hours as needed for cough/wheeze/shortness of breath/chest tightness.  May use 15-20 minutes prior to activity.   Monitor frequency of use.    Asthma control goals:   Full participation in all desired activities (may need albuterol before activity)  Albuterol use two time or less a week on average (not counting use with activity)  Cough interfering with sleep two time or less a month  Oral steroids no more than once a year  No hospitalizations   Mixed rhinitis  - Continue Singulair as above  - continue Xyzal 5mg  in evening to help minimize sneezing episodes.  May try to take every other day.   - Continue Flonase 1-2 sprays each nostril daily as needed for congestion.  Use for 1-2 weeks at a time before stopping once symptoms improved.   - Continue Astelin 1-2 sprays twice a day as needed for nasal drainage/post-nasal drip  Follow-up in 6 months or sooner if needed.

## 2020-07-19 LAB — CYTOLOGY - PAP
Adequacy: ABSENT
Diagnosis: NEGATIVE

## 2020-07-24 ENCOUNTER — Ambulatory Visit: Payer: Medicare HMO

## 2020-07-24 ENCOUNTER — Other Ambulatory Visit: Payer: Self-pay

## 2020-07-24 DIAGNOSIS — M5412 Radiculopathy, cervical region: Secondary | ICD-10-CM | POA: Diagnosis not present

## 2020-07-24 DIAGNOSIS — M6281 Muscle weakness (generalized): Secondary | ICD-10-CM

## 2020-07-24 NOTE — Therapy (Signed)
[facility] [address], [place], [place], [address] Phone: [phone]   Fax:  [phone]  Physical Therapy Treatment  Patient Details  Name: [name] MRN: [id number] Date of Birth: 17-Nov-1950 Referring Provider (PT): Dr. London Pepper   Encounter Date: 07/24/2020   PT End of Session - 07/24/20 1303    Visit Number 7    Date for PT Re-Evaluation 08/20/20    Authorization Type [employer] Medicare    Authorization - Visit Number 6    Authorization - Number of Visits 10    PT Start Time 1234    PT Stop Time 1317    PT Time Calculation (min) 43 min    Activity Tolerance Patient tolerated treatment well    Behavior During Therapy [facility] for tasks assessed/performed           Past Medical History:  Diagnosis Date  . Asthma   . Chronic back pain   . Diabetes mellitus without complication ([place])    no med  . GERD (gastroesophageal reflux disease)   . Hyperlipemia   . Hypertension   . Hypothyroidism   . Insomnia   . OAB (overactive bladder)   . Seasonal allergies   . Sleep apnea    uses a cpap    Past Surgical History:  Procedure Laterality Date  . ANKLE ARTHROTOMY  2003   left-fx  . APPENDECTOMY    . back fusion  02/14/2019   L4 back fusion  . BACK SURGERY  2010   lumb disk  . BREAST LUMPECTOMY WITH RADIOACTIVE SEED LOCALIZATION Left 06/13/2019   Procedure: LEFT BREAST LUMPECTOMY WITH RADIOACTIVE SEED LOCALIZATION;  Surgeon: Erroll Luna, MD;  Location: [place];  Service: General;  Laterality: Left;  . CARPAL TUNNEL RELEASE     rt  . CARPAL TUNNEL RELEASE Left 08/01/2013   Procedure: LEFT CARPAL TUNNEL RELEASE;  Surgeon: Wynonia Sours, MD;  Location: [place];  Service: Orthopedics;  Laterality: Left;  . CESAREAN SECTION  83,86  . COLONOSCOPY    . DILATATION & CURETTAGE/HYSTEROSCOPY WITH MYOSURE N/A 08/23/2018   Procedure: DILATATION & CURETTAGE/HYSTEROSCOPY WITH  MYOSURE POLYPECTOMY;  Surgeon: Nunzio Cobbs, MD;  Location: [facility];  Service: Gynecology;  Laterality: N/A;  fibroid resection  . DILATION AND CURETTAGE OF UTERUS     x2  . MASS EXCISION N/A 08/04/2017   Procedure: EXCISION LIPOMA UPPER BACK x2 AND NECK;  Surgeon: Erroll Luna, MD;  Location: [place];  Service: General;  Laterality: N/A;  . NASAL SEPTOPLASTY W/ TURBINOPLASTY Bilateral 06/21/2015   Procedure: NASAL SEPTOPLASTY WITH BILATERAL TURBINATE REDUCTION;  Surgeon: Jerrell Belfast, MD;  Location: [place];  Service: ENT;  Laterality: Bilateral;  . SINOSCOPY    . TONSILLECTOMY    . TUBAL LIGATION      There were no vitals filed for this visit.   Subjective Assessment - 07/24/20 1238    Subjective I still feel about 20% better. The pain hasn't woken me up at night.    Currently in Pain? Yes    Pain Score 2     Pain Location Arm    Pain Orientation Right    Pain Descriptors / Indicators Pins and needles    Pain Type Chronic pain    Pain Onset More than a month ago    Pain Frequency Intermittent    Aggravating Factors  use of Rt arm  Pain Relieving Factors bending head to the left                             [facility] Adult PT Treatment/Exercise - 07/24/20 0001      Neck Exercises: Machines for Strengthening   UBE (Upper Arm Bike) Level 1x 6 minutes (3/3)- PT present to discuss progress      Shoulder Exercises: Seated   Other Seated Exercises 3 way raises: 1# added 2x10      Shoulder Exercises: Standing   Extension Strengthening;Right;Theraband;10 reps    Theraband Level (Shoulder Extension) Level 1 (Yellow)    Row Strengthening;Both;Theraband;20 reps      Modalities   Modalities Traction      Traction   Type of Traction Cervical    Min (lbs) 8    Max (lbs) 16    Hold Time 60    Rest Time 10    Time 12      Neck Exercises: Stretches   Upper Trapezius Stretch Left;2 reps;20 seconds                     PT Short Term Goals - 07/15/20 1109      PT SHORT TERM GOAL #1   Title The patient will demonstrate understanding of basic self care strategies including sitting postural alignment, left sidebending for symptom control, basic ex to prevent further strength loss    Status Achieved      PT SHORT TERM GOAL #2   Title The patient will report a 25% improvement in right neck and UE symptoms with ADLs including cooking and sleeping    Baseline 20% improvement    Status On-going      PT SHORT TERM GOAL #3   Title The patient will have improved cervical flexion to 40 degrees and bil rotation to 45 degrees needed for driving    Status On-going             PT Long Term Goals - 05/28/20 1741      PT LONG TERM GOAL #1   Title The patient will be independent with safe self progression of HEP    Time 12    Period Weeks    Status New    Target Date 08/20/20      PT LONG TERM GOAL #2   Title The patient will report a 50% improvement in right neck and UE symptoms with usual ADLs    Time 12    Period Weeks    Status New      PT LONG TERM GOAL #3   Title The patient will have grossly 4+/5 right UE strength  and grip on right to 50# needed for cooking/ lifting things in the kitchen    Time 12    Period Weeks    Status New      PT LONG TERM GOAL #4   Title Cervical sidebending to right and left to 40 degrees and extension to 40 degrees needed for ADLs including taking care of grandson    Time 12    Period Weeks    Status New      PT LONG TERM GOAL #5   Title FOTO functional outcome score improved from 36% limitation to 33%    Time 12    Period Weeks    Status New                 Plan -  07/24/20 1312    Clinical Impression Statement Pt reports 20% reduction in symptoms since the start of care. Patient has increased right shoulder flexion, right wrist and grip strength since the start of care. Patient continues to need postural reminders while doing  exercises to keep her cervical spine in neutral and decrease the pressure on the nerve. Pt is responding well to traction and reports relief of Rt UE symptoms after traction.   Pt will continue to benefit from skilled PT to address Rt UE pain and weakness.    Rehab Potential Good    PT Frequency 1x / week    PT Duration 12 weeks    PT Treatment/Interventions ADLs/Self Care Home Management;Cryotherapy;Electrical Stimulation;Ultrasound;Traction;Moist Heat;Iontophoresis 4mg /ml Dexamethasone;Therapeutic activities;Therapeutic exercise;Neuromuscular re-education;Manual techniques;Patient/family education;Taping;Dry needling    PT Next Visit Plan DN, traction.  Send MD note for appt on 08/01/20    PT Home Exercise Plan [id number]    Consulted and Agree with Plan of Care Patient           Patient will benefit from skilled therapeutic intervention in order to improve the following deficits and impairments:  Decreased range of motion,Increased fascial restricitons,Pain,Decreased strength,Impaired perceived functional ability  Visit Diagnosis: Radiculopathy, cervical region  Muscle weakness (generalized)     Problem List Patient Active Problem List   Diagnosis Date Noted  . Mild persistent asthma without complication 28/41/3244  . Mixed rhinitis 06/10/2018  . Obstructive sleep apnea 12/11/2016  . Palpitations 06/17/2016  . Deflected nasal septum 09/13/2015  . Hypertrophy of nasal turbinates 09/13/2015  . Deviated nasal septum 06/21/2015    Class: Chronic  . HYPERLIPIDEMIA 11/18/2007  . Essential hypertension 11/18/2007  . Allergic rhinitis 11/18/2007  . COUGH 11/18/2007     Sigurd Sos, PT 07/24/20 1:13 PM  [facility] [address] [place], [place], [address] Phone: [phone]   Fax:  [phone]  Name: [name] MRN: [id number] Date of Birth: 06/19/1951

## 2020-07-29 ENCOUNTER — Other Ambulatory Visit: Payer: Self-pay

## 2020-07-29 ENCOUNTER — Ambulatory Visit: Payer: Medicare HMO

## 2020-07-29 DIAGNOSIS — M6281 Muscle weakness (generalized): Secondary | ICD-10-CM | POA: Diagnosis not present

## 2020-07-29 DIAGNOSIS — M5412 Radiculopathy, cervical region: Secondary | ICD-10-CM | POA: Diagnosis not present

## 2020-07-29 NOTE — Therapy (Signed)
[facility] [address], [place], [place], [address] Phone: [phone]   Fax:  [phone]  Physical Therapy Treatment  Patient Details  Name: [name] MRN: [id number] Date of Birth: January 05, 1951 Referring Provider (PT): Dr. London Pepper   Encounter Date: 07/29/2020   PT End of Session - 07/29/20 1434    Visit Number 8    Date for PT Re-Evaluation 08/20/20    Authorization Type [employer] Medicare    Authorization - Visit Number 7    Authorization - Number of Visits 10    PT Start Time [id number]    PT Stop Time 1447    PT Time Calculation (min) 44 min    Activity Tolerance Patient tolerated treatment well    Behavior During Therapy [facility] for tasks assessed/performed           Past Medical History:  Diagnosis Date  . Asthma   . Chronic back pain   . Diabetes mellitus without complication ([place])    no med  . GERD (gastroesophageal reflux disease)   . Hyperlipemia   . Hypertension   . Hypothyroidism   . Insomnia   . OAB (overactive bladder)   . Seasonal allergies   . Sleep apnea    uses a cpap    Past Surgical History:  Procedure Laterality Date  . ANKLE ARTHROTOMY  2003   left-fx  . APPENDECTOMY    . back fusion  02/14/2019   L4 back fusion  . BACK SURGERY  2010   lumb disk  . BREAST LUMPECTOMY WITH RADIOACTIVE SEED LOCALIZATION Left 06/13/2019   Procedure: LEFT BREAST LUMPECTOMY WITH RADIOACTIVE SEED LOCALIZATION;  Surgeon: Erroll Luna, MD;  Location: [place];  Service: General;  Laterality: Left;  . CARPAL TUNNEL RELEASE     rt  . CARPAL TUNNEL RELEASE Left 08/01/2013   Procedure: LEFT CARPAL TUNNEL RELEASE;  Surgeon: Wynonia Sours, MD;  Location: [place];  Service: Orthopedics;  Laterality: Left;  . CESAREAN SECTION  83,86  . COLONOSCOPY    . DILATATION & CURETTAGE/HYSTEROSCOPY WITH MYOSURE N/A 08/23/2018   Procedure: DILATATION & CURETTAGE/HYSTEROSCOPY WITH  MYOSURE POLYPECTOMY;  Surgeon: Nunzio Cobbs, MD;  Location: [facility];  Service: Gynecology;  Laterality: N/A;  fibroid resection  . DILATION AND CURETTAGE OF UTERUS     x2  . MASS EXCISION N/A 08/04/2017   Procedure: EXCISION LIPOMA UPPER BACK x2 AND NECK;  Surgeon: Erroll Luna, MD;  Location: [place];  Service: General;  Laterality: N/A;  . NASAL SEPTOPLASTY W/ TURBINOPLASTY Bilateral 06/21/2015   Procedure: NASAL SEPTOPLASTY WITH BILATERAL TURBINATE REDUCTION;  Surgeon: Jerrell Belfast, MD;  Location: [place];  Service: ENT;  Laterality: Bilateral;  . SINOSCOPY    . TONSILLECTOMY    . TUBAL LIGATION      There were no vitals filed for this visit.   Subjective Assessment - 07/29/20 1408    Subjective I feel 20% better overall.  I am sleeping much better overall.    Patient Stated Goals not have this pain and feeling in right arm; strategies to improve that    Currently in Pain? Yes    Pain Score 5     Pain Location Arm    Pain Orientation Right    Pain Descriptors / Indicators Tingling    Pain Type Chronic pain    Pain Radiating Towards Rt arm to the dorsum of the hand  Pain Onset More than a month ago    Pain Frequency Intermittent    Aggravating Factors  driving, repetaive use of Rt UE, lifting groceries    Pain Relieving Factors bending head to the Lt              [facility] PT Assessment - 07/29/20 0001      Assessment   Medical Diagnosis neck pain, right shoulder pain     Referring Provider (PT) Dr. London Pepper      Cognition   Overall Cognitive Status Within Functional Limits for tasks assessed      Observation/Other Assessments   Focus on Therapeutic Outcomes (FOTO)  39% limitation      AROM   Cervical Flexion 45    Cervical - Right Side Bend 30    Cervical - Left Side Bend 35    Cervical - Right Rotation 40    Cervical - Left Rotation 40      Strength   Right Shoulder Flexion 4+/5    Right Shoulder  Internal Rotation 4/5    Right Shoulder External Rotation 4+/5                         [facility] Adult PT Treatment/Exercise - 07/29/20 0001      Modalities   Modalities Traction      Traction   Type of Traction Cervical    Min (lbs) 8    Max (lbs) 16    Hold Time 60    Rest Time 10    Time 12      Manual Therapy   Manual Therapy Soft tissue mobilization    Manual therapy comments to assess for dry needling            Trigger Point Dry Needling - 07/29/20 0001    Consent Given? Yes    Muscles Treated Head and Neck Upper trapezius;Cervical multifidi    Dry Needling Comments Rt and Lt: C3-T3    Upper Trapezius Response Twitch reponse elicited;Palpable increased muscle length   Lt upper traps   Cervical multifidi Response Twitch reponse elicited;Palpable increased muscle length                  PT Short Term Goals - 07/15/20 1109      PT SHORT TERM GOAL #1   Title The patient will demonstrate understanding of basic self care strategies including sitting postural alignment, left sidebending for symptom control, basic ex to prevent further strength loss    Status Achieved      PT SHORT TERM GOAL #2   Title The patient will report a 25% improvement in right neck and UE symptoms with ADLs including cooking and sleeping    Baseline 20% improvement    Status On-going      PT SHORT TERM GOAL #3   Title The patient will have improved cervical flexion to 40 degrees and bil rotation to 45 degrees needed for driving    Status On-going             PT Long Term Goals - 05/28/20 1741      PT LONG TERM GOAL #1   Title The patient will be independent with safe self progression of HEP    Time 12    Period Weeks    Status New    Target Date 08/20/20      PT LONG TERM GOAL #2   Title The patient will report a  50% improvement in right neck and UE symptoms with usual ADLs    Time 12    Period Weeks    Status New      PT LONG TERM GOAL #3   Title The  patient will have grossly 4+/5 right UE strength  and grip on right to 50# needed for cooking/ lifting things in the kitchen    Time 12    Period Weeks    Status New      PT LONG TERM GOAL #4   Title Cervical sidebending to right and left to 40 degrees and extension to 40 degrees needed for ADLs including taking care of grandson    Time 12    Period Weeks    Status New      PT LONG TERM GOAL #5   Title FOTO functional outcome score improved from 36% limitation to 33%    Time 12    Period Weeks    Status New                 Plan - 07/29/20 1419    Clinical Impression Statement Pt reports 20% reduction in symptoms since the start of care. Patient has increased right shoulder flexion, right wrist and grip strength since the start of care.  Pt reports that she is able to sleep without limitation now, meeting this goal.  Pt reports 5/10 Rt UE pain that extends from the shoulder to the dorsum of the hand and pt describes it as tingling.  This is worse with driving, repetitive use of the Rt UE, and lifting groceries.  Pain is reduced with traction and moving head to the Lt.  Pt with mild tension in the cervical spine and had good response with multiple twitch responses and improved tissue mobility after manual therapy.  Pt is responding well to traction and reports relief of Rt UE symptoms after traction.   Pt will see neuro doctor this week for assessment.  Pt will continue to benefit from skilled PT to address Rt UE pain and weakness.    PT Frequency 1x / week    PT Duration 12 weeks    PT Treatment/Interventions ADLs/Self Care Home Management;Cryotherapy;Electrical Stimulation;Ultrasound;Traction;Moist Heat;Iontophoresis 4mg /ml Dexamethasone;Therapeutic activities;Therapeutic exercise;Neuromuscular re-education;Manual techniques;Patient/family education;Taping;Dry needling    PT Next Visit Plan continue traction.  See what MD says.  Postural strength, manual as needed    PT Home Exercise  Plan [id number]    Consulted and Agree with Plan of Care Patient           Patient will benefit from skilled therapeutic intervention in order to improve the following deficits and impairments:  Decreased range of motion,Increased fascial restricitons,Pain,Decreased strength,Impaired perceived functional ability  Visit Diagnosis: Radiculopathy, cervical region  Muscle weakness (generalized)     Problem List Patient Active Problem List   Diagnosis Date Noted  . Mild persistent asthma without complication 05/02/2535  . Mixed rhinitis 06/10/2018  . Obstructive sleep apnea 12/11/2016  . Palpitations 06/17/2016  . Deflected nasal septum 09/13/2015  . Hypertrophy of nasal turbinates 09/13/2015  . Deviated nasal septum 06/21/2015    Class: Chronic  . HYPERLIPIDEMIA 11/18/2007  . Essential hypertension 11/18/2007  . Allergic rhinitis 11/18/2007  . COUGH 11/18/2007    Sigurd Sos, PT 07/29/20 2:36 PM  [facility] [address] [place], [place], [address] Phone: [phone]   Fax:  [phone]  Name: [name] MRN: [id number] Date of Birth: 1950-10-11

## 2020-07-30 DIAGNOSIS — R69 Illness, unspecified: Secondary | ICD-10-CM | POA: Diagnosis not present

## 2020-07-30 DIAGNOSIS — E782 Mixed hyperlipidemia: Secondary | ICD-10-CM | POA: Diagnosis not present

## 2020-07-30 DIAGNOSIS — E669 Obesity, unspecified: Secondary | ICD-10-CM | POA: Diagnosis not present

## 2020-07-30 DIAGNOSIS — G4733 Obstructive sleep apnea (adult) (pediatric): Secondary | ICD-10-CM | POA: Diagnosis not present

## 2020-07-30 DIAGNOSIS — I10 Essential (primary) hypertension: Secondary | ICD-10-CM | POA: Diagnosis not present

## 2020-08-01 DIAGNOSIS — E039 Hypothyroidism, unspecified: Secondary | ICD-10-CM | POA: Diagnosis not present

## 2020-08-01 DIAGNOSIS — M4327 Fusion of spine, lumbosacral region: Secondary | ICD-10-CM | POA: Diagnosis not present

## 2020-08-01 DIAGNOSIS — E119 Type 2 diabetes mellitus without complications: Secondary | ICD-10-CM | POA: Diagnosis not present

## 2020-08-01 DIAGNOSIS — Z0000 Encounter for general adult medical examination without abnormal findings: Secondary | ICD-10-CM | POA: Diagnosis not present

## 2020-08-01 DIAGNOSIS — Z6837 Body mass index (BMI) 37.0-37.9, adult: Secondary | ICD-10-CM | POA: Diagnosis not present

## 2020-08-01 DIAGNOSIS — R69 Illness, unspecified: Secondary | ICD-10-CM | POA: Diagnosis not present

## 2020-08-01 DIAGNOSIS — J4530 Mild persistent asthma, uncomplicated: Secondary | ICD-10-CM | POA: Diagnosis not present

## 2020-08-01 DIAGNOSIS — G4733 Obstructive sleep apnea (adult) (pediatric): Secondary | ICD-10-CM | POA: Diagnosis not present

## 2020-08-01 DIAGNOSIS — M4722 Other spondylosis with radiculopathy, cervical region: Secondary | ICD-10-CM | POA: Diagnosis not present

## 2020-08-01 DIAGNOSIS — I10 Essential (primary) hypertension: Secondary | ICD-10-CM | POA: Diagnosis not present

## 2020-08-01 DIAGNOSIS — M542 Cervicalgia: Secondary | ICD-10-CM | POA: Diagnosis not present

## 2020-08-01 DIAGNOSIS — E785 Hyperlipidemia, unspecified: Secondary | ICD-10-CM | POA: Diagnosis not present

## 2020-08-05 ENCOUNTER — Ambulatory Visit: Payer: Medicare HMO

## 2020-08-05 ENCOUNTER — Other Ambulatory Visit: Payer: Self-pay

## 2020-08-05 DIAGNOSIS — M5412 Radiculopathy, cervical region: Secondary | ICD-10-CM | POA: Diagnosis not present

## 2020-08-05 DIAGNOSIS — M6281 Muscle weakness (generalized): Secondary | ICD-10-CM

## 2020-08-05 NOTE — Therapy (Addendum)
[facility] [address], [place], [place], [address] Phone: [phone]   Fax:  [phone]  Physical Therapy Treatment  Patient Details  Name: [name] MRN: [id number] Date of Birth: July 16, 1950 Referring Provider (PT): Dr. London Pepper   Encounter Date: 08/05/2020   PT End of Session - 08/05/20 1304    Visit Number 9    Date for PT Re-Evaluation 08/20/20    Authorization Type [employer]na Medicare    PT Start Time 1232    PT Stop Time 1314    PT Time Calculation (min) 42 min    Activity Tolerance Patient tolerated treatment well    Behavior During Therapy [facility] for tasks assessed/performed           Past Medical History:  Diagnosis Date  . Asthma   . Chronic back pain   . Diabetes mellitus without complication ([place])    no med  . GERD (gastroesophageal reflux disease)   . Hyperlipemia   . Hypertension   . Hypothyroidism   . Insomnia   . OAB (overactive bladder)   . Seasonal allergies   . Sleep apnea    uses a cpap    Past Surgical History:  Procedure Laterality Date  . ANKLE ARTHROTOMY  2003   left-fx  . APPENDECTOMY    . back fusion  02/14/2019   L4 back fusion  . BACK SURGERY  2010   lumb disk  . BREAST LUMPECTOMY WITH RADIOACTIVE SEED LOCALIZATION Left 06/13/2019   Procedure: LEFT BREAST LUMPECTOMY WITH RADIOACTIVE SEED LOCALIZATION;  Surgeon: Erroll Luna, MD;  Location: [place];  Service: General;  Laterality: Left;  . CARPAL TUNNEL RELEASE     rt  . CARPAL TUNNEL RELEASE Left 08/01/2013   Procedure: LEFT CARPAL TUNNEL RELEASE;  Surgeon: Wynonia Sours, MD;  Location: [place];  Service: Orthopedics;  Laterality: Left;  . CESAREAN SECTION  83,86  . COLONOSCOPY    . DILATATION & CURETTAGE/HYSTEROSCOPY WITH MYOSURE N/A 08/23/2018   Procedure: DILATATION & CURETTAGE/HYSTEROSCOPY WITH MYOSURE POLYPECTOMY;  Surgeon: Nunzio Cobbs, MD;  Location:  [facility];  Service: Gynecology;  Laterality: N/A;  fibroid resection  . DILATION AND CURETTAGE OF UTERUS     x2  . MASS EXCISION N/A 08/04/2017   Procedure: EXCISION LIPOMA UPPER BACK x2 AND NECK;  Surgeon: Erroll Luna, MD;  Location: [place];  Service: General;  Laterality: N/A;  . NASAL SEPTOPLASTY W/ TURBINOPLASTY Bilateral 06/21/2015   Procedure: NASAL SEPTOPLASTY WITH BILATERAL TURBINATE REDUCTION;  Surgeon: Jerrell Belfast, MD;  Location: [place];  Service: ENT;  Laterality: Bilateral;  . SINOSCOPY    . TONSILLECTOMY    . TUBAL LIGATION      There were no vitals filed for this visit.   Subjective Assessment - 08/05/20 1234    Subjective Doing the needling and traction together helped the benefit to last longer after last session.  I saw the MD and she wants me to continue with PT and she ordered a home traction unit.    Currently in Pain? No/denies                             [facility] Adult PT Treatment/Exercise - 08/05/20 0001      Neck Exercises: Machines for Strengthening   UBE (Upper Arm Bike) Level 1x 6 minutes (3/3)- PT present to discuss progress  Manual Therapy   Manual Therapy Soft tissue mobilization    Manual therapy comments to assess for dry needling            Trigger Point Dry Needling - 08/05/20 0001    Consent Given? Yes    Muscles Treated Head and Neck Upper trapezius;Cervical multifidi    Dry Needling Comments Rt and Lt: C3-T3    Upper Trapezius Response Twitch reponse elicited;Palpable increased muscle length   Lt upper traps   Cervical multifidi Response Twitch reponse elicited;Palpable increased muscle length             Traction    Type of Traction Cervical    Min (lbs) 8    Max (lbs) 16    Hold Time 60    Rest Time 10    Time 12           PT Short Term Goals - 07/15/20 1109      PT SHORT TERM GOAL #1   Title The patient will demonstrate understanding of basic self  care strategies including sitting postural alignment, left sidebending for symptom control, basic ex to prevent further strength loss    Status Achieved      PT SHORT TERM GOAL #2   Title The patient will report a 25% improvement in right neck and UE symptoms with ADLs including cooking and sleeping    Baseline 20% improvement    Status On-going      PT SHORT TERM GOAL #3   Title The patient will have improved cervical flexion to 40 degrees and bil rotation to 45 degrees needed for driving    Status On-going             PT Long Term Goals - 05/28/20 1741      PT LONG TERM GOAL #1   Title The patient will be independent with safe self progression of HEP    Time 12    Period Weeks    Status New    Target Date 08/20/20      PT LONG TERM GOAL #2   Title The patient will report a 50% improvement in right neck and UE symptoms with usual ADLs    Time 12    Period Weeks    Status New      PT LONG TERM GOAL #3   Title The patient will have grossly 4+/5 right UE strength  and grip on right to 50# needed for cooking/ lifting things in the kitchen    Time 12    Period Weeks    Status New      PT LONG TERM GOAL #4   Title Cervical sidebending to right and left to 40 degrees and extension to 40 degrees needed for ADLs including taking care of grandson    Time 12    Period Weeks    Status New      PT LONG TERM GOAL #5   Title FOTO functional outcome score improved from 36% limitation to 33%    Time 12    Period Weeks    Status New                 Plan - 08/05/20 1303    Clinical Impression Statement Pt had good result from dry needling and traction combo last session.  Pt reports 25% overall improvement in symptoms.  Pt reports 5/10 Rt UE pain that extends from the shoulder to the dorsum of the hand and pt describes it  as tingling.  This is worse with driving, repetitive use of the Rt UE, and lifting groceries.  Pain is reduced with traction and moving head to the Lt.  Pt  with mild tension in the cervical spine and had good response with multiple twitch responses and improved tissue mobility after manual therapy.  Pt is responding well to traction and reports relief of Rt UE symptoms after traction.   Pt is going to get a home traction unit to address radiculopathy.  Pt continues to report compliance with HEP for flexibility and strength.  Pt will continue to benefit from skilled PT to address UE radiculopathy, strength and flexibility.    PT Frequency 1x / week    PT Duration 12 weeks    PT Treatment/Interventions ADLs/Self Care Home Management;Cryotherapy;Electrical Stimulation;Ultrasound;Traction;Moist Heat;Iontophoresis 4mg /ml Dexamethasone;Therapeutic activities;Therapeutic exercise;Neuromuscular re-education;Manual techniques;Patient/family education;Taping;Dry needling    PT Next Visit Plan continue traction, manual and  Postural strength,    PT Home Exercise Plan [email or web address]    Consulted and Agree with Plan of Care Patient           Patient will benefit from skilled therapeutic intervention in order to improve the following deficits and impairments:  Decreased range of motion,Increased fascial restricitons,Pain,Decreased strength,Impaired perceived functional ability  Visit Diagnosis: Radiculopathy, cervical region  Muscle weakness (generalized)     Problem List Patient Active Problem List   Diagnosis Date Noted  . Mild persistent asthma without complication 87/56/4332  . Mixed rhinitis 06/10/2018  . Obstructive sleep apnea 12/11/2016  . Palpitations 06/17/2016  . Deflected nasal septum 09/13/2015  . Hypertrophy of nasal turbinates 09/13/2015  . Deviated nasal septum 06/21/2015    Class: Chronic  . HYPERLIPIDEMIA 11/18/2007  . Essential hypertension 11/18/2007  . Allergic rhinitis 11/18/2007  . COUGH 11/18/2007    Sigurd Sos, PT 08/05/20 1:08 PM  [facility] [address], [place], [place], [address] Phone: [phone]   Fax:  [phone]  Name: [name] MRN: [id number] Date of Birth: 1951-04-15

## 2020-08-12 ENCOUNTER — Ambulatory Visit: Payer: Medicare HMO | Attending: Family Medicine

## 2020-08-12 ENCOUNTER — Other Ambulatory Visit: Payer: Self-pay

## 2020-08-12 DIAGNOSIS — M6281 Muscle weakness (generalized): Secondary | ICD-10-CM

## 2020-08-12 DIAGNOSIS — R293 Abnormal posture: Secondary | ICD-10-CM | POA: Diagnosis not present

## 2020-08-12 DIAGNOSIS — M5412 Radiculopathy, cervical region: Secondary | ICD-10-CM

## 2020-08-12 NOTE — Therapy (Signed)
[facility] [address] [place], [place], [address] Phone: [phone]   Fax:  [phone]  Physical Therapy Treatment  Patient Details  Name: [name] MRN: [id number] Date of Birth: [birth year] Referring Provider (PT): Dr. London Pepper   Encounter Date: 08/12/2020  Progress Note Reporting Period 05/28/20 to 08/12/20  See note below for Objective Data and Assessment of Progress/Goals.        PT End of Session - 08/12/20 1305    Visit Number 10    Date for PT Re-Evaluation 08/20/20    Authorization Type [employer]etna Medicare    Authorization - Visit Number 10    Authorization - Number of Visits 10    PT Start Time 1232    PT Stop Time 1318    PT Time Calculation (min) 46 min    Activity Tolerance Patient tolerated treatment well    Behavior During Therapy WFL for tasks assessed/performed           Past Medical History:  Diagnosis Date  . Asthma   . Chronic back pain   . Diabetes mellitus without complication ([place])    no med  . GERD (gastroesophageal reflux disease)   . Hyperlipemia   . Hypertension   . Hypothyroidism   . Insomnia   . OAB (overactive bladder)   . Seasonal allergies   . Sleep apnea    uses a cpap    Past Surgical History:  Procedure Laterality Date  . ANKLE ARTHROTOMY  2003   left-fx  . APPENDECTOMY    . back fusion  02/14/2019   L4 back fusion  . BACK SURGERY  2010   lumb disk  . BREAST LUMPECTOMY WITH RADIOACTIVE SEED LOCALIZATION Left 06/13/2019   Procedure: LEFT BREAST LUMPECTOMY WITH RADIOACTIVE SEED LOCALIZATION;  Surgeon: Erroll Luna, MD;  Location: [place];  Service: General;  Laterality: Left;  . CARPAL TUNNEL RELEASE     rt  . CARPAL TUNNEL RELEASE Left 08/01/2013   Procedure: LEFT CARPAL TUNNEL RELEASE;  Surgeon: Wynonia Sours, MD;  Location: [place];  Service: Orthopedics;  Laterality: Left;  . CESAREAN SECTION  83,86  .  COLONOSCOPY    . DILATATION & CURETTAGE/HYSTEROSCOPY WITH MYOSURE N/A 08/23/2018   Procedure: DILATATION & CURETTAGE/HYSTEROSCOPY WITH MYOSURE POLYPECTOMY;  Surgeon: Nunzio Cobbs, MD;  Location: [facility];  Service: Gynecology;  Laterality: N/A;  fibroid resection  . DILATION AND CURETTAGE OF UTERUS     x2  . MASS EXCISION N/A 08/04/2017   Procedure: EXCISION LIPOMA UPPER BACK x2 AND NECK;  Surgeon: Erroll Luna, MD;  Location: [place];  Service: General;  Laterality: N/A;  . NASAL SEPTOPLASTY W/ TURBINOPLASTY Bilateral 06/21/2015   Procedure: NASAL SEPTOPLASTY WITH BILATERAL TURBINATE REDUCTION;  Surgeon: Jerrell Belfast, MD;  Location: [place];  Service: ENT;  Laterality: Bilateral;  . SINOSCOPY    . TONSILLECTOMY    . TUBAL LIGATION      There were no vitals filed for this visit.   Subjective Assessment - 08/12/20 1234    Subjective I am still working on getting a home traction unit.  I am feeling much better overall.    Pertinent History lumbar fusion 2020;  lipomas right upper trap region;  bil Carpal tunnel release;  PT for neck in 2014 but resolved (no UE symptoms with that)  [facility] PT Assessment - 08/12/20 0001      Assessment   Medical Diagnosis neck pain, right shoulder pain     Referring Provider (PT) Dr. London Pepper      Cognition   Overall Cognitive Status Within Functional Limits for tasks assessed      Observation/Other Assessments   Focus on Therapeutic Outcomes (FOTO)  39% limitation      Strength   Right Shoulder Flexion 4+/5    Right Shoulder Internal Rotation 4/5    Right Shoulder External Rotation 4+/5    Right Hand Grip (lbs) 64                         [facility] Adult PT Treatment/Exercise - 08/12/20 0001      Shoulder Exercises: Standing   Extension Strengthening;Right;Theraband;10 reps    Theraband Level (Shoulder Extension) Level 2 (Red)    Extension Limitations verbal cues  for speed    Row Strengthening;Both;20 reps;Theraband    Theraband Level (Shoulder Row) Level 2 (Red)    Row Limitations verbal cues for alignment      Shoulder Exercises: ROM/Strengthening   UBE (Upper Arm Bike) Level 1x 6 minutes (3/3)- PT present for assessment      Traction   Type of Traction Cervical    Min (lbs) 8    Max (lbs) 16    Hold Time 60    Rest Time 10    Time 12      Manual Therapy   Manual Therapy Soft tissue mobilization    Manual therapy comments to assess for dry needling            Trigger Point Dry Needling - 08/12/20 0001    Consent Given? Yes    Muscles Treated Head and Neck Upper trapezius;Cervical multifidi    Dry Needling Comments Rt and Lt: C3-T3    Upper Trapezius Response Twitch reponse elicited;Palpable increased muscle length   Lt upper traps   Cervical multifidi Response Twitch reponse elicited;Palpable increased muscle length                  PT Short Term Goals - 07/15/20 1109      PT SHORT TERM GOAL #1   Title The patient will demonstrate understanding of basic self care strategies including sitting postural alignment, left sidebending for symptom control, basic ex to prevent further strength loss    Status Achieved      PT SHORT TERM GOAL #2   Title The patient will report a 25% improvement in right neck and UE symptoms with ADLs including cooking and sleeping    Baseline 20% improvement    Status On-going      PT SHORT TERM GOAL #3   Title The patient will have improved cervical flexion to 40 degrees and bil rotation to 45 degrees needed for driving    Status On-going             PT Long Term Goals - 08/12/20 1240      PT LONG TERM GOAL #1   Title The patient will be independent with safe self progression of HEP    Status On-going      PT LONG TERM GOAL #2   Title The patient will report a 50% improvement in right neck and UE symptoms with usual ADLs    Status Achieved      PT LONG TERM GOAL #5   Title FOTO  functional outcome score  improved from 36% limitation to 33%    Time 12    Period Weeks    Status On-going                 Plan - 08/12/20 1246    Clinical Impression Statement Pt had good result from dry needling and traction combo last session.  Pt reports 50% overall improvement in symptoms this week.   Pt reports 4/10 Rt UE pain that extends from the shoulder to the dorsum of the hand and pt describes it as tingling.  This is worse with driving, repetitive use of the Rt UE, and lifting groceries.  Pain is reduced with traction and moving head to the Lt.  Pt with mild tension in the cervical spine and had good response with multiple twitch responses and improved tissue mobility after manual therapy.  Pt is responding well to traction and reports relief of Rt UE symptoms after traction.   Pt is going to get a home traction unit to address radiculopathy.  Pt will have a long drive to [place] and will determine if this causes a flare-up for her.   Pt continues to report compliance with HEP for flexibility and strength.  Pt will continue to benefit from skilled PT to address UE radiculopathy, strength and flexibility.    PT Frequency 1x / week    PT Duration 12 weeks    PT Treatment/Interventions ADLs/Self Care Home Management;Cryotherapy;Electrical Stimulation;Ultrasound;Traction;Moist Heat;Iontophoresis 4mg /ml Dexamethasone;Therapeutic activities;Therapeutic exercise;Neuromuscular re-education;Manual techniques;Patient/family education;Taping;Dry needling    PT Next Visit Plan recert next session    PT Home Exercise Plan [id number]           Patient will benefit from skilled therapeutic intervention in order to improve the following deficits and impairments:  Decreased range of motion,Increased fascial restricitons,Pain,Decreased strength,Impaired perceived functional ability  Visit Diagnosis: Radiculopathy, cervical region  Muscle weakness (generalized)     Problem  List Patient Active Problem List   Diagnosis Date Noted  . Mild persistent asthma without complication 18/56/3149  . Mixed rhinitis 06/10/2018  . Obstructive sleep apnea 12/11/2016  . Palpitations 06/17/2016  . Deflected nasal septum 09/13/2015  . Hypertrophy of nasal turbinates 09/13/2015  . Deviated nasal septum 06/21/2015    Class: Chronic  . HYPERLIPIDEMIA 11/18/2007  . Essential hypertension 11/18/2007  . Allergic rhinitis 11/18/2007  . COUGH 11/18/2007     Sigurd Sos, PT 08/12/20 1:08 PM  [facility] [address] [place], [place], [address] Phone: [phone]   Fax:  [phone]  Name: [name] MRN: [id number] Date of Birth: [birth year]

## 2020-08-19 ENCOUNTER — Ambulatory Visit: Payer: Medicare HMO

## 2020-08-19 ENCOUNTER — Other Ambulatory Visit: Payer: Self-pay

## 2020-08-19 DIAGNOSIS — M5412 Radiculopathy, cervical region: Secondary | ICD-10-CM | POA: Diagnosis not present

## 2020-08-19 DIAGNOSIS — M6281 Muscle weakness (generalized): Secondary | ICD-10-CM

## 2020-08-19 DIAGNOSIS — R293 Abnormal posture: Secondary | ICD-10-CM | POA: Diagnosis not present

## 2020-08-19 NOTE — Therapy (Signed)
[facility] [address], [place], [place], [address] Phone: [phone]   Fax:  [phone]  Physical Therapy Treatment  Patient Details  Name: [name] MRN: [id number] Date of Birth: 12-Sep-1950 Referring Provider (PT): Dr. London Pepper   Encounter Date: 08/19/2020   PT End of Session - 08/19/20 1303    Visit Number 11    Date for PT Re-Evaluation 09/30/20    Authorization Type [employer]na Medicare    PT Start Time 1229    PT Stop Time 1315    PT Time Calculation (min) 46 min    Activity Tolerance Patient tolerated treatment well    Behavior During Therapy [facility] for tasks assessed/performed           Past Medical History:  Diagnosis Date  . Asthma   . Chronic back pain   . Diabetes mellitus without complication ([place])    no med  . GERD (gastroesophageal reflux disease)   . Hyperlipemia   . Hypertension   . Hypothyroidism   . Insomnia   . OAB (overactive bladder)   . Seasonal allergies   . Sleep apnea    uses a cpap    Past Surgical History:  Procedure Laterality Date  . ANKLE ARTHROTOMY  2003   left-fx  . APPENDECTOMY    . back fusion  02/14/2019   L4 back fusion  . BACK SURGERY  2010   lumb disk  . BREAST LUMPECTOMY WITH RADIOACTIVE SEED LOCALIZATION Left 06/13/2019   Procedure: LEFT BREAST LUMPECTOMY WITH RADIOACTIVE SEED LOCALIZATION;  Surgeon: Erroll Luna, MD;  Location: [place];  Service: General;  Laterality: Left;  . CARPAL TUNNEL RELEASE     rt  . CARPAL TUNNEL RELEASE Left 08/01/2013   Procedure: LEFT CARPAL TUNNEL RELEASE;  Surgeon: Wynonia Sours, MD;  Location: [place];  Service: Orthopedics;  Laterality: Left;  . CESAREAN SECTION  83,86  . COLONOSCOPY    . DILATATION & CURETTAGE/HYSTEROSCOPY WITH MYOSURE N/A 08/23/2018   Procedure: DILATATION & CURETTAGE/HYSTEROSCOPY WITH MYOSURE POLYPECTOMY;  Surgeon: Nunzio Cobbs, MD;  Location:  [facility];  Service: Gynecology;  Laterality: N/A;  fibroid resection  . DILATION AND CURETTAGE OF UTERUS     x2  . MASS EXCISION N/A 08/04/2017   Procedure: EXCISION LIPOMA UPPER BACK x2 AND NECK;  Surgeon: Erroll Luna, MD;  Location: [place];  Service: General;  Laterality: N/A;  . NASAL SEPTOPLASTY W/ TURBINOPLASTY Bilateral 06/21/2015   Procedure: NASAL SEPTOPLASTY WITH BILATERAL TURBINATE REDUCTION;  Surgeon: Jerrell Belfast, MD;  Location: [place];  Service: ENT;  Laterality: Bilateral;  . SINOSCOPY    . TONSILLECTOMY    . TUBAL LIGATION      There were no vitals filed for this visit.   Subjective Assessment - 08/19/20 1231    Subjective I was able to drive to [place] and back with minimal problems.    Pertinent History lumbar fusion 2020;  lipomas right upper trap region;  bil Carpal tunnel release;  PT for neck in 2014 but resolved (no UE symptoms with that)    Currently in Pain? No/denies              [facility] PT Assessment - 08/19/20 0001      Assessment   Medical Diagnosis neck pain, right shoulder pain     Referring Provider (PT) Dr. London Pepper      Cognition  Overall Cognitive Status Within Functional Limits for tasks assessed      Observation/Other Assessments   Focus on Therapeutic Outcomes (FOTO)  39% limitation      AROM   Cervical - Right Side Bend 30    Cervical - Left Side Bend 35      Strength   Right Shoulder Flexion 4+/5    Right Shoulder Internal Rotation 4/5    Right Shoulder External Rotation 4+/5    Right Hand Grip (lbs) 64                         [facility] Adult PT Treatment/Exercise - 08/19/20 0001      Neck Exercises: Machines for Strengthening   UBE (Upper Arm Bike) Level 1x 6 minutes (3/3)- PT present to discuss progress      Shoulder Exercises: Supine   Horizontal ABduction Strengthening;Both;20 reps;Theraband    Theraband Level (Shoulder Horizontal ABduction) Level 2 (Red)     External Rotation Strengthening;Both;20 reps;Theraband    Theraband Level (Shoulder External Rotation) Level 2 (Red)      Shoulder Exercises: Seated   External Rotation --    Theraband Level (Shoulder External Rotation) --    Other Seated Exercises 3 way raises: 1# added 2x10      Traction   Type of Traction Cervical    Min (lbs) 8    Max (lbs) 16    Hold Time 60    Rest Time 10    Time 12      Manual Therapy   Manual Therapy Soft tissue mobilization    Manual therapy comments elongation and trigger point release to bil neck and upper traps.                    PT Short Term Goals - 07/15/20 1109      PT SHORT TERM GOAL #1   Title The patient will demonstrate understanding of basic self care strategies including sitting postural alignment, left sidebending for symptom control, basic ex to prevent further strength loss    Status Achieved      PT SHORT TERM GOAL #2   Title The patient will report a 25% improvement in right neck and UE symptoms with ADLs including cooking and sleeping    Baseline 20% improvement    Status On-going      PT SHORT TERM GOAL #3   Title The patient will have improved cervical flexion to 40 degrees and bil rotation to 45 degrees needed for driving    Status On-going             PT Long Term Goals - 08/19/20 1243      PT LONG TERM GOAL #1   Title The patient will be independent with safe self progression of HEP    Period Weeks    Status On-going    Target Date 09/30/20      PT LONG TERM GOAL #2   Title The patient will report a 75% improvement in right neck and UE symptoms with usual ADLs    Baseline 60%    Time 6    Period Weeks    Status Revised    Target Date 09/30/20      PT LONG TERM GOAL #3   Title The patient will have grossly 4+/5 to 5/5 right UE strength  and grip on right to 50# needed for cooking/ lifting things in the kitchen    Baseline met  except for IR    Time 6    Status Revised    Target Date 09/30/20       PT LONG TERM GOAL #4   Title Cervical sidebending to right and left to 40 degrees and extension to 40 degrees needed for ADLs including taking care of grandson    Time 6    Period Weeks    Status On-going    Target Date 09/30/20      PT LONG TERM GOAL #5   Title FOTO functional outcome score improved from 36% limitation to 33%    Time 6    Period Weeks    Status On-going    Target Date 09/30/20                 Plan - 08/19/20 1234    Clinical Impression Statement Pt had good result from dry needling and traction combo last session and was able to drive to [place] and back with minimal issues.  Pt reports 60% overall improvement in symptoms this week.   Pt reports 4/10 max Rt UE pain that extends from the shoulder to the dorsum of the hand and pt describes it as tingling.  Pt with challenged with endurance tasks and maintaining upright posture with these tasks. This is worse with driving, repetitive use of the Rt UE, and lifting groceries.  Pain is reduced with traction and moving head to the Lt.  Pt with mild tension in the cervical spine and had good response and improved tissue mobility after manual therapy.  Pt is responding well to traction and reports relief of Rt UE symptoms after traction.  Pt is going to get a home traction unit to address radiculopathy. Pt continues to report compliance with HEP for flexibility and strength.  Pt will continue to benefit from skilled PT to address UE radiculopathy, strength and flexibility.    PT Frequency 1x / week    PT Duration 6 weeks    PT Treatment/Interventions ADLs/Self Care Home Management;Cryotherapy;Electrical Stimulation;Ultrasound;Traction;Moist Heat;Iontophoresis 87m/ml Dexamethasone;Therapeutic activities;Therapeutic exercise;Neuromuscular re-education;Manual techniques;Patient/family education;Taping;Dry needling    PT Next Visit Plan postural strength, endurance, manaul and traction    PT Home Exercise Plan [id number]     Recommended Other Services recert sent 23/57/01   Consulted and Agree with Plan of Care Patient           Patient will benefit from skilled therapeutic intervention in order to improve the following deficits and impairments:  Decreased range of motion,Increased fascial restricitons,Pain,Decreased strength,Impaired perceived functional ability  Visit Diagnosis: Radiculopathy, cervical region - Plan: PT plan of care cert/re-cert  Muscle weakness (generalized) - Plan: PT plan of care cert/re-cert  Abnormal posture - Plan: PT plan of care cert/re-cert     Problem List Patient Active Problem List   Diagnosis Date Noted  . Mild persistent asthma without complication 177/93/9030 . Mixed rhinitis 06/10/2018  . Obstructive sleep apnea 12/11/2016  . Palpitations 06/17/2016  . Deflected nasal septum 09/13/2015  . Hypertrophy of nasal turbinates 09/13/2015  . Deviated nasal septum 06/21/2015    Class: Chronic  . HYPERLIPIDEMIA 11/18/2007  . Essential hypertension 11/18/2007  . Allergic rhinitis 11/18/2007  . COUGH 11/18/2007     KSigurd Sos PT 08/19/20 1:06 PM   [facility] [address] [place] [address]Phone: [phone]  Fax:  [phone] Name: Theresa BIELICKIMRN: [id number] of Birth: [birth year]

## 2020-09-02 ENCOUNTER — Ambulatory Visit: Payer: Medicare HMO

## 2020-09-02 ENCOUNTER — Other Ambulatory Visit: Payer: Self-pay

## 2020-09-02 DIAGNOSIS — R293 Abnormal posture: Secondary | ICD-10-CM

## 2020-09-02 DIAGNOSIS — M6281 Muscle weakness (generalized): Secondary | ICD-10-CM

## 2020-09-02 DIAGNOSIS — M5412 Radiculopathy, cervical region: Secondary | ICD-10-CM

## 2020-09-02 NOTE — Therapy (Signed)
[facility] [address], [place], [place], [address] Phone: [phone]   Fax:  [phone]  Physical Therapy Treatment  Patient Details  Name: [name] MRN: [id number] Date of Birth: 11/10/1950 Referring Provider (PT): Dr. London Pepper   Encounter Date: 09/02/2020   PT End of Session - 09/02/20 1300    Visit Number 12    Date for PT Re-Evaluation 09/30/20    Authorization Type Aetna Medicare    PT Start Time 1232    PT Stop Time 1312    PT Time Calculation (min) 40 min    Activity Tolerance Patient tolerated treatment well    Behavior During Therapy [facility] for tasks assessed/performed           Past Medical History:  Diagnosis Date  . Asthma   . Chronic back pain   . Diabetes mellitus without complication ([place])    no med  . GERD (gastroesophageal reflux disease)   . Hyperlipemia   . Hypertension   . Hypothyroidism   . Insomnia   . OAB (overactive bladder)   . Seasonal allergies   . Sleep apnea    uses a cpap    Past Surgical History:  Procedure Laterality Date  . ANKLE ARTHROTOMY  2003   left-fx  . APPENDECTOMY    . back fusion  02/14/2019   L4 back fusion  . BACK SURGERY  2010   lumb disk  . BREAST LUMPECTOMY WITH RADIOACTIVE SEED LOCALIZATION Left 06/13/2019   Procedure: LEFT BREAST LUMPECTOMY WITH RADIOACTIVE SEED LOCALIZATION;  Surgeon: Erroll Luna, MD;  Location: [place];  Service: General;  Laterality: Left;  . CARPAL TUNNEL RELEASE     rt  . CARPAL TUNNEL RELEASE Left 08/01/2013   Procedure: LEFT CARPAL TUNNEL RELEASE;  Surgeon: Wynonia Sours, MD;  Location: [place];  Service: Orthopedics;  Laterality: Left;  . CESAREAN SECTION  83,86  . COLONOSCOPY    . DILATATION & CURETTAGE/HYSTEROSCOPY WITH MYOSURE N/A 08/23/2018   Procedure: DILATATION & CURETTAGE/HYSTEROSCOPY WITH MYOSURE POLYPECTOMY;  Surgeon: Nunzio Cobbs, MD;  Location:  [facility];  Service: Gynecology;  Laterality: N/A;  fibroid resection  . DILATION AND CURETTAGE OF UTERUS     x2  . MASS EXCISION N/A 08/04/2017   Procedure: EXCISION LIPOMA UPPER BACK x2 AND NECK;  Surgeon: Erroll Luna, MD;  Location: [place];  Service: General;  Laterality: N/A;  . NASAL SEPTOPLASTY W/ TURBINOPLASTY Bilateral 06/21/2015   Procedure: NASAL SEPTOPLASTY WITH BILATERAL TURBINATE REDUCTION;  Surgeon: Jerrell Belfast, MD;  Location: [place];  Service: ENT;  Laterality: Bilateral;  . SINOSCOPY    . TONSILLECTOMY    . TUBAL LIGATION      There were no vitals filed for this visit.   Subjective Assessment - 09/02/20 1233    Subjective I was able to take another drive to [place] and didn't have many problems.  My thumb isn't going numb, just pain in my upper arm and shoulder.    Currently in Pain? Yes    Pain Score 2     Pain Location Shoulder    Pain Orientation Right    Pain Descriptors / Indicators Burning    Pain Type Chronic pain    Pain Onset More than a month ago    Pain Frequency Intermittent    Aggravating Factors  not anyting specific    Pain Relieving Factors bending head  to the [facility] Adult PT Treatment/Exercise - 09/02/20 0001      Neck Exercises: Machines for Strengthening   UBE (Upper Arm Bike) Level 1x 6 minutes (3/3)- PT present to discuss progress      Shoulder Exercises: Seated   Other Seated Exercises 3 way raises: 1# added 2x10      Shoulder Exercises: ROM/Strengthening   UBE (Upper Arm Bike) Level 1x 6 minutes (3/3)- PT present for assessment      Traction   Type of Traction Cervical    Min (lbs) 8    Max (lbs) 16    Hold Time 60    Rest Time 10    Time 12            Trigger Point Dry Needling - 09/02/20 0001    Consent Given? Yes    Muscles Treated Head and Neck Upper trapezius;Cervical multifidi    Dry Needling Comments Rt and Lt: C3-T3     Upper Trapezius Response Twitch reponse elicited;Palpable increased muscle length   Lt upper traps   Cervical multifidi Response Twitch reponse elicited;Palpable increased muscle length                  PT Short Term Goals - 07/15/20 1109      PT SHORT TERM GOAL #1   Title The patient will demonstrate understanding of basic self care strategies including sitting postural alignment, left sidebending for symptom control, basic ex to prevent further strength loss    Status Achieved      PT SHORT TERM GOAL #2   Title The patient will report a 25% improvement in right neck and UE symptoms with ADLs including cooking and sleeping    Baseline 20% improvement    Status On-going      PT SHORT TERM GOAL #3   Title The patient will have improved cervical flexion to 40 degrees and bil rotation to 45 degrees needed for driving    Status On-going             PT Long Term Goals - 08/19/20 1243      PT LONG TERM GOAL #1   Title The patient will be independent with safe self progression of HEP    Period Weeks    Status On-going    Target Date 09/30/20      PT LONG TERM GOAL #2   Title The patient will report a 75% improvement in right neck and UE symptoms with usual ADLs    Baseline 60%    Time 6    Period Weeks    Status Revised    Target Date 09/30/20      PT LONG TERM GOAL #3   Title The patient will have grossly 4+/5 to 5/5 right UE strength  and grip on right to 50# needed for cooking/ lifting things in the kitchen    Baseline met except for IR    Time 6    Status Revised    Target Date 09/30/20      PT LONG TERM GOAL #4   Title Cervical sidebending to right and left to 40 degrees and extension to 40 degrees needed for ADLs including taking care of grandson    Time 6    Period Weeks    Status On-going    Target  Date 09/30/20      PT LONG TERM GOAL #5   Title FOTO functional outcome score improved from 36% limitation to 33%    Time 6    Period Weeks    Status  On-going    Target Date 09/30/20                 Plan - 09/02/20 1241    Clinical Impression Statement Pt was able to take another trip to [place] last week and didn't have increased neck or shoulder symptoms.   Pt reports 75% overall improvement in symptoms this week.   Pt has not had pain with sleep at night and symptoms in the Rt UE have been centralizing to the shoulder.   Pt with challenged with endurance tasks and maintaining upright posture with these tasks. This is worse with driving, repetitive use of the Rt UE, and lifting groceries.  Pain is reduced with traction and moving head to the Lt.  Pt with mild tension in the cervical spine and had good response and improved tissue mobility after manual therapy.  Pt is responding well to traction and reports relief of Rt UE symptoms after traction. Pt continues to report compliance with HEP for flexibility and strength.  Pt will continue to benefit from skilled PT to address UE radiculopathy, strength and flexibility    PT Frequency 1x / week    PT Duration 6 weeks    PT Treatment/Interventions ADLs/Self Care Home Management;Cryotherapy;Electrical Stimulation;Ultrasound;Traction;Moist Heat;Iontophoresis 40m/ml Dexamethasone;Therapeutic activities;Therapeutic exercise;Neuromuscular re-education;Manual techniques;Patient/family education;Taping;Dry needling    PT Next Visit Plan postural strength, endurance, manaul and traction    PT Home Exercise Plan [id number]    Consulted and Agree with Plan of Care Patient           Patient will benefit from skilled therapeutic intervention in order to improve the following deficits and impairments:  Decreased range of motion,Increased fascial restricitons,Pain,Decreased strength,Impaired perceived functional ability  Visit Diagnosis: Radiculopathy, cervical region  Muscle weakness (generalized)  Abnormal posture     Problem List Patient Active Problem List   Diagnosis Date Noted  .  Mild persistent asthma without complication 176/19/5093 . Mixed rhinitis 06/10/2018  . Obstructive sleep apnea 12/11/2016  . Palpitations 06/17/2016  . Deflected nasal septum 09/13/2015  . Hypertrophy of nasal turbinates 09/13/2015  . Deviated nasal septum 06/21/2015    Class: Chronic  . HYPERLIPIDEMIA 11/18/2007  . Essential hypertension 11/18/2007  . Allergic rhinitis 11/18/2007  . COUGH 11/18/2007    KSigurd Sos PT 09/02/20 1:03 PM  [place] [facility] [address] [place] [address]Phone: [phone]  Fax:  [phone] Name: JCARMINA WALLEMRN: [id number] of Birth: 4September 29, 1952

## 2020-09-09 ENCOUNTER — Other Ambulatory Visit: Payer: Self-pay

## 2020-09-09 ENCOUNTER — Ambulatory Visit: Payer: Medicare HMO | Attending: Family Medicine

## 2020-09-09 DIAGNOSIS — R293 Abnormal posture: Secondary | ICD-10-CM | POA: Diagnosis not present

## 2020-09-09 DIAGNOSIS — M5412 Radiculopathy, cervical region: Secondary | ICD-10-CM | POA: Diagnosis not present

## 2020-09-09 DIAGNOSIS — M6281 Muscle weakness (generalized): Secondary | ICD-10-CM | POA: Insufficient documentation

## 2020-09-09 NOTE — Therapy (Signed)
[facility] [address], [place], [place], [address] Phone: [phone]   Fax:  [phone]  Physical Therapy Treatment  Patient Details  Name: [name] MRN: [id number] Date of Birth: [birth year] Referring Provider (PT): Dr. London Pepper   Encounter Date: 09/09/2020   PT End of Session - 09/09/20 1426    Visit Number 13    Date for PT Re-Evaluation 09/30/20    Authorization Type [employer] Medicare    Authorization - Visit Number 10    Authorization - Number of Visits 10    PT Start Time 1400    PT Stop Time 1457    PT Time Calculation (min) 57 min    Activity Tolerance Patient tolerated treatment well    Behavior During Therapy [facility] for tasks assessed/performed           Past Medical History:  Diagnosis Date  . Asthma   . Chronic back pain   . Diabetes mellitus without complication ([place])    no med  . GERD (gastroesophageal reflux disease)   . Hyperlipemia   . Hypertension   . Hypothyroidism   . Insomnia   . OAB (overactive bladder)   . Seasonal allergies   . Sleep apnea    uses a cpap    Past Surgical History:  Procedure Laterality Date  . ANKLE ARTHROTOMY  2003   left-fx  . APPENDECTOMY    . back fusion  02/14/2019   L4 back fusion  . BACK SURGERY  2010   lumb disk  . BREAST LUMPECTOMY WITH RADIOACTIVE SEED LOCALIZATION Left 06/13/2019   Procedure: LEFT BREAST LUMPECTOMY WITH RADIOACTIVE SEED LOCALIZATION;  Surgeon: Erroll Luna, MD;  Location: [place];  Service: General;  Laterality: Left;  . CARPAL TUNNEL RELEASE     rt  . CARPAL TUNNEL RELEASE Left 08/01/2013   Procedure: LEFT CARPAL TUNNEL RELEASE;  Surgeon: Wynonia Sours, MD;  Location: [place];  Service: Orthopedics;  Laterality: Left;  . CESAREAN SECTION  83,86  . COLONOSCOPY    . DILATATION & CURETTAGE/HYSTEROSCOPY WITH MYOSURE N/A 08/23/2018   Procedure: DILATATION & CURETTAGE/HYSTEROSCOPY WITH  MYOSURE POLYPECTOMY;  Surgeon: Nunzio Cobbs, MD;  Location: [facility];  Service: Gynecology;  Laterality: N/A;  fibroid resection  . DILATION AND CURETTAGE OF UTERUS     x2  . MASS EXCISION N/A 08/04/2017   Procedure: EXCISION LIPOMA UPPER BACK x2 AND NECK;  Surgeon: Erroll Luna, MD;  Location: [place];  Service: General;  Laterality: N/A;  . NASAL SEPTOPLASTY W/ TURBINOPLASTY Bilateral 06/21/2015   Procedure: NASAL SEPTOPLASTY WITH BILATERAL TURBINATE REDUCTION;  Surgeon: Jerrell Belfast, MD;  Location: [place];  Service: ENT;  Laterality: Bilateral;  . SINOSCOPY    . TONSILLECTOMY    . TUBAL LIGATION      There were no vitals filed for this visit.   Subjective Assessment - 09/09/20 1409    Subjective Patient reports that she may have "over done it" yesterday while cleaning the grout in her home. Notes no thumb numbness. States that discomfort is a "dull ache". Patient reports 2/10 proximal R shoulder pain. Patient states that she performs HEP almost daily. Continues to feel that she symptoms are 75% resolved. Is no longer having pain at night and notices that she is able to maintain neutral cervical alignment more rather than L lateral flexion.    Pertinent History lumbar fusion 2020;  lipomas  right upper trap region;  bil Carpal tunnel release;  PT for neck in 2014 but resolved (no UE symptoms with that)    Currently in Pain? Yes    Pain Score 2     Pain Location Shoulder    Pain Orientation Right    Pain Descriptors / Indicators Aching    Pain Type Chronic pain    Pain Onset More than a month ago    Pain Frequency Intermittent                             [facility] Adult PT Treatment/Exercise - 09/09/20 0001      Neck Exercises: Machines for Strengthening   UBE (Upper Arm Bike) Level 1.5x 6 minutes (3/3)- PT present to discuss progress      Shoulder Exercises: Seated   Other Seated Exercises 3 way raises: 1# added  2x10      Shoulder Exercises: Standing   Extension Strengthening;Right;Theraband;10 reps    Theraband Level (Shoulder Extension) Level 2 (Red)    Row Strengthening;Both;20 reps;Theraband    Theraband Level (Shoulder Row) Level 2 (Red)      Traction   Type of Traction Cervical    Min (lbs) 8    Max (lbs) 16    Hold Time 60    Rest Time 10    Time 12      Manual Therapy   Manual Therapy Soft tissue mobilization    Manual therapy comments elongation and trigger point release to bil neck and upper traps.                  PT Education - 09/09/20 1426    Education Details added red tband for HEP    Person(s) Educated Patient    Methods Explanation    Comprehension Verbalized understanding;Returned demonstration            PT Short Term Goals - 07/15/20 1109      PT SHORT TERM GOAL #1   Title The patient will demonstrate understanding of basic self care strategies including sitting postural alignment, left sidebending for symptom control, basic ex to prevent further strength loss    Status Achieved      PT SHORT TERM GOAL #2   Title The patient will report a 25% improvement in right neck and UE symptoms with ADLs including cooking and sleeping    Baseline 20% improvement    Status On-going      PT SHORT TERM GOAL #3   Title The patient will have improved cervical flexion to 40 degrees and bil rotation to 45 degrees needed for driving    Status On-going             PT Long Term Goals - 08/19/20 1243      PT LONG TERM GOAL #1   Title The patient will be independent with safe self progression of HEP    Period Weeks    Status On-going    Target Date 09/30/20      PT LONG TERM GOAL #2   Title The patient will report a 75% improvement in right neck and UE symptoms with usual ADLs    Baseline 60%    Time 6    Period Weeks    Status Revised    Target Date 09/30/20      PT LONG TERM GOAL #3   Title The patient will have grossly 4+/5 to 5/5 right UE  strength  and grip on right to 50# needed for cooking/ lifting things in the kitchen    Baseline met except for IR    Time 6    Status Revised    Target Date 09/30/20      PT LONG TERM GOAL #4   Title Cervical sidebending to right and left to 40 degrees and extension to 40 degrees needed for ADLs including taking care of grandson    Time 6    Period Weeks    Status On-going    Target Date 09/30/20      PT LONG TERM GOAL #5   Title FOTO functional outcome score improved from 36% limitation to 33%    Time 6    Period Weeks    Status On-going    Target Date 09/30/20                 Plan - 09/09/20 1444    Clinical Impression Statement Pt arrived with Rt shoulder soreness due to doing a lot of cleaning yesterday. Pt continues to report 75% overall improvement in symptoms this week.   Pt has not had pain with sleep at night and symptoms in the Rt UE have been centralizing to the shoulder.   Pt with challenged with endurance tasks and maintaining upright posture with these tasks. This is worse with driving, repetitive use of the Rt UE, and lifting groceries.  Pt reports that she no longer has to move her head to the Lt for pain reduction.  Pt with mild tension in the cervical spine and had good response and improved tissue mobility after manual therapy.  Pt is responding well to traction and reports relief of Rt UE symptoms after traction. Pt continues to report compliance with HEP for flexibility and strength.  Pt will continue to benefit from skilled PT to address UE radiculopathy, strength and flexibility.    PT Frequency 1x / week    PT Duration 6 weeks    PT Treatment/Interventions ADLs/Self Care Home Management;Cryotherapy;Electrical Stimulation;Ultrasound;Traction;Moist Heat;Iontophoresis 4m/ml Dexamethasone;Therapeutic activities;Therapeutic exercise;Neuromuscular re-education;Manual techniques;Patient/family education;Taping;Dry needling    PT Next Visit Plan 3 more visits.   Continue postural strength, manual and traction    PT Home Exercise Plan [email or web address]    Recommended Other Services recert is signed    Consulted and Agree with Plan of Care Patient           Patient will benefit from skilled therapeutic intervention in order to improve the following deficits and impairments:  Decreased range of motion,Increased fascial restricitons,Pain,Decreased strength,Impaired perceived functional ability  Visit Diagnosis: Radiculopathy, cervical region  Muscle weakness (generalized)  Abnormal posture     Problem List Patient Active Problem List   Diagnosis Date Noted  . Mild persistent asthma without complication 165/53/7482 . Mixed rhinitis 06/10/2018  . Obstructive sleep apnea 12/11/2016  . Palpitations 06/17/2016  . Deflected nasal septum 09/13/2015  . Hypertrophy of nasal turbinates 09/13/2015  . Deviated nasal septum 06/21/2015    Class: Chronic  . HYPERLIPIDEMIA 11/18/2007  . Essential hypertension 11/18/2007  . Allergic rhinitis 11/18/2007  . COUGH 11/18/2007     KSigurd Sos PT 09/09/20 2:49 PM  [facility] [address] [place] [address]Phone: [phone]  Fax:  [phone] Name: JLERIN JECHMRN: [id number] of Birth: [birth year]

## 2020-09-16 ENCOUNTER — Other Ambulatory Visit: Payer: Self-pay

## 2020-09-16 ENCOUNTER — Ambulatory Visit: Payer: Medicare HMO

## 2020-09-16 DIAGNOSIS — R293 Abnormal posture: Secondary | ICD-10-CM | POA: Diagnosis not present

## 2020-09-16 DIAGNOSIS — M6281 Muscle weakness (generalized): Secondary | ICD-10-CM

## 2020-09-16 DIAGNOSIS — M5412 Radiculopathy, cervical region: Secondary | ICD-10-CM | POA: Diagnosis not present

## 2020-09-16 NOTE — Therapy (Signed)
[facility] [address] [place], [place], [address] Phone: [phone]   Fax:  [phone]  Physical Therapy Treatment  Patient Details  Name: [name] MRN: [id number] Date of Birth: 1951/06/23 Referring Provider (PT): Dr. London Pepper   Encounter Date: 09/16/2020   PT End of Session - 09/16/20 1437    Visit Number 14    Date for PT Re-Evaluation 09/30/20    Authorization Type Aetna Medicare    Authorization - Number of Visits 20    PT Start Time 1401    PT Stop Time 1449    PT Time Calculation (min) 48 min    Activity Tolerance Patient tolerated treatment well    Behavior During Therapy [facility] for tasks assessed/performed           Past Medical History:  Diagnosis Date  . Asthma   . Chronic back pain   . Diabetes mellitus without complication ([place])    no med  . GERD (gastroesophageal reflux disease)   . Hyperlipemia   . Hypertension   . Hypothyroidism   . Insomnia   . OAB (overactive bladder)   . Seasonal allergies   . Sleep apnea    uses a cpap    Past Surgical History:  Procedure Laterality Date  . ANKLE ARTHROTOMY  2003   left-fx  . APPENDECTOMY    . back fusion  02/14/2019   L4 back fusion  . BACK SURGERY  2010   lumb disk  . BREAST LUMPECTOMY WITH RADIOACTIVE SEED LOCALIZATION Left 06/13/2019   Procedure: LEFT BREAST LUMPECTOMY WITH RADIOACTIVE SEED LOCALIZATION;  Surgeon: Erroll Luna, MD;  Location: [place];  Service: General;  Laterality: Left;  . CARPAL TUNNEL RELEASE     rt  . CARPAL TUNNEL RELEASE Left 08/01/2013   Procedure: LEFT CARPAL TUNNEL RELEASE;  Surgeon: Wynonia Sours, MD;  Location: [place];  Service: Orthopedics;  Laterality: Left;  . CESAREAN SECTION  83,86  . COLONOSCOPY    . DILATATION & CURETTAGE/HYSTEROSCOPY WITH MYOSURE N/A 08/23/2018   Procedure: DILATATION & CURETTAGE/HYSTEROSCOPY WITH MYOSURE POLYPECTOMY;  Surgeon:  Nunzio Cobbs, MD;  Location: [facility];  Service: Gynecology;  Laterality: N/A;  fibroid resection  . DILATION AND CURETTAGE OF UTERUS     x2  . MASS EXCISION N/A 08/04/2017   Procedure: EXCISION LIPOMA UPPER BACK x2 AND NECK;  Surgeon: Erroll Luna, MD;  Location: [place];  Service: General;  Laterality: N/A;  . NASAL SEPTOPLASTY W/ TURBINOPLASTY Bilateral 06/21/2015   Procedure: NASAL SEPTOPLASTY WITH BILATERAL TURBINATE REDUCTION;  Surgeon: Jerrell Belfast, MD;  Location: [place];  Service: ENT;  Laterality: Bilateral;  . SINOSCOPY    . TONSILLECTOMY    . TUBAL LIGATION      There were no vitals filed for this visit.   Subjective Assessment - 09/16/20 1404    Subjective I am still doing good.  I woke up with tingling this morning but it wasn't bad.  I haven't had that happen in a long time.    Currently in Pain? Yes    Pain Score 2     Pain Location Shoulder    Pain Orientation Right    Pain Descriptors / Indicators Aching    Pain Type Chronic pain    Pain Onset More than a month ago    Pain Frequency Intermittent    Aggravating Factors  night sometimes  [place] Adult PT Treatment/Exercise - 09/16/20 0001      Neck Exercises: Machines for Strengthening   UBE (Upper Arm Bike) Level 1.5x 6 minutes (3/3)- PT present to discuss progress      Shoulder Exercises: Supine   Horizontal ABduction Strengthening;Both;20 reps;Theraband    Theraband Level (Shoulder Horizontal ABduction) Level 2 (Red)    External Rotation Strengthening;Both;20 reps;Theraband    Theraband Level (Shoulder External Rotation) Level 2 (Red)    Diagonals Strengthening;Both;20 reps;Theraband    Theraband Level (Shoulder Diagonals) Level 2 (Red)      Shoulder Exercises: Seated   Other Seated Exercises 3 way raises: 1# added 2x10      Traction   Type of Traction Cervical    Min (lbs) 8    Max (lbs) 16    Hold Time 60     Rest Time 10    Time 12      Manual Therapy   Manual Therapy Soft tissue mobilization    Manual therapy comments elongation and trigger point release to bil neck and upper traps.                    PT Short Term Goals - 07/15/20 1109      PT SHORT TERM GOAL #1   Title The patient will demonstrate understanding of basic self care strategies including sitting postural alignment, left sidebending for symptom control, basic ex to prevent further strength loss    Status Achieved      PT SHORT TERM GOAL #2   Title The patient will report a 25% improvement in right neck and UE symptoms with ADLs including cooking and sleeping    Baseline 20% improvement    Status On-going      PT SHORT TERM GOAL #3   Title The patient will have improved cervical flexion to 40 degrees and bil rotation to 45 degrees needed for driving    Status On-going             PT Long Term Goals - 08/19/20 1243      PT LONG TERM GOAL #1   Title The patient will be independent with safe self progression of HEP    Period Weeks    Status On-going    Target Date 09/30/20      PT LONG TERM GOAL #2   Title The patient will report a 75% improvement in right neck and UE symptoms with usual ADLs    Baseline 60%    Time 6    Period Weeks    Status Revised    Target Date 09/30/20      PT LONG TERM GOAL #3   Title The patient will have grossly 4+/5 to 5/5 right UE strength  and grip on right to 50# needed for cooking/ lifting things in the kitchen    Baseline met except for IR    Time 6    Status Revised    Target Date 09/30/20      PT LONG TERM GOAL #4   Title Cervical sidebending to right and left to 40 degrees and extension to 40 degrees needed for ADLs including taking care of grandson    Time 6    Period Weeks    Status On-going    Target Date 09/30/20      PT LONG TERM GOAL #5   Title FOTO functional outcome score improved from 36% limitation to 33%    Time 6    Period Weeks  Status On-going    Target Date 09/30/20                 Plan - 09/16/20 1418    Clinical Impression Statement Pt arrived with very mild Rt shoulder pain and this is not extending past the elbow.  Pt continues to report 75% overall improvement in symptoms this week.   Pt had some Rt UE tingling this morning but this was an outlying event as she hasn't experienced this in weeks.   Pt with challenged with endurance tasks and maintaining upright posture with these tasks.  Pt reports that she no longer has to move her head to the Lt for pain reduction.  Pt with mild tension in the cervical spine and had good response and improved tissue mobility after manual therapy.  Pt is responding well to traction and reports relief of Rt UE symptoms after traction. Pt continues to report compliance with HEP for flexibility and strength.  Pt will continue to benefit from skilled PT to address UE radiculopathy, strength and flexibility.    PT Frequency 1x / week    PT Duration 6 weeks    PT Treatment/Interventions ADLs/Self Care Home Management;Cryotherapy;Electrical Stimulation;Ultrasound;Traction;Moist Heat;Iontophoresis 18m/ml Dexamethasone;Therapeutic activities;Therapeutic exercise;Neuromuscular re-education;Manual techniques;Patient/family education;Taping;Dry needling    PT Next Visit Plan 2 more visits.  Continue postural strength, manual and traction    PT Home Exercise Plan [email or web address]    Consulted and Agree with Plan of Care Patient           Patient will benefit from skilled therapeutic intervention in order to improve the following deficits and impairments:  Decreased range of motion,Increased fascial restricitons,Pain,Decreased strength,Impaired perceived functional ability  Visit Diagnosis: Radiculopathy, cervical region  Muscle weakness (generalized)  Abnormal posture     Problem List Patient Active Problem List   Diagnosis Date Noted  . Mild persistent asthma without complication  167/20/9198 . Mixed rhinitis 06/10/2018  . Obstructive sleep apnea 12/11/2016  . Palpitations 06/17/2016  . Deflected nasal septum 09/13/2015  . Hypertrophy of nasal turbinates 09/13/2015  . Deviated nasal septum 06/21/2015    Class: Chronic  . HYPERLIPIDEMIA 11/18/2007  . Essential hypertension 11/18/2007  . Allergic rhinitis 11/18/2007  . COUGH 11/18/2007   KSigurd Sos PT 09/16/20 2:42 PM  [facility] [address] [place] [address]Phone: [phone]  Fax:  [phone] Name: Theresa PICHONMRN: [id number] of Birth: 4April 21, 1952

## 2020-09-23 ENCOUNTER — Ambulatory Visit: Payer: Medicare HMO

## 2020-09-23 ENCOUNTER — Other Ambulatory Visit: Payer: Self-pay

## 2020-09-23 DIAGNOSIS — R293 Abnormal posture: Secondary | ICD-10-CM

## 2020-09-23 DIAGNOSIS — M5412 Radiculopathy, cervical region: Secondary | ICD-10-CM

## 2020-09-23 DIAGNOSIS — M6281 Muscle weakness (generalized): Secondary | ICD-10-CM | POA: Diagnosis not present

## 2020-09-23 NOTE — Therapy (Signed)
[facility] [address] [place], [place], [address] Phone: [phone]   Fax:  [phone]  Physical Therapy Treatment  Patient Details  Name: [name] MRN: [id number] Date of Birth: May 03, 1951 Referring Provider (PT): Dr. London Pepper   Encounter Date: 09/23/2020   PT End of Session - 09/23/20 1437    Visit Number 15    Date for PT Re-Evaluation 09/30/20    Authorization Type [employer]    PT Start Time 1402    PT Stop Time 1448    PT Time Calculation (min) 46 min    Activity Tolerance Patient tolerated treatment well    Behavior During Therapy [facility] for tasks assessed/performed           Past Medical History:  Diagnosis Date  . Asthma   . Chronic back pain   . Diabetes mellitus without complication ([place])    no med  . GERD (gastroesophageal reflux disease)   . Hyperlipemia   . Hypertension   . Hypothyroidism   . Insomnia   . OAB (overactive bladder)   . Seasonal allergies   . Sleep apnea    uses a cpap    Past Surgical History:  Procedure Laterality Date  . ANKLE ARTHROTOMY  2003   left-fx  . APPENDECTOMY    . back fusion  02/14/2019   L4 back fusion  . BACK SURGERY  2010   lumb disk  . BREAST LUMPECTOMY WITH RADIOACTIVE SEED LOCALIZATION Left 06/13/2019   Procedure: LEFT BREAST LUMPECTOMY WITH RADIOACTIVE SEED LOCALIZATION;  Surgeon: Erroll Luna, MD;  Location: [place];  Service: General;  Laterality: Left;  . CARPAL TUNNEL RELEASE     rt  . CARPAL TUNNEL RELEASE Left 08/01/2013   Procedure: LEFT CARPAL TUNNEL RELEASE;  Surgeon: Wynonia Sours, MD;  Location: [place];  Service: Orthopedics;  Laterality: Left;  . CESAREAN SECTION  83,86  . COLONOSCOPY    . DILATATION & CURETTAGE/HYSTEROSCOPY WITH MYOSURE N/A 08/23/2018   Procedure: DILATATION & CURETTAGE/HYSTEROSCOPY WITH MYOSURE POLYPECTOMY;  Surgeon: Nunzio Cobbs, MD;  Location:  [facility];  Service: Gynecology;  Laterality: N/A;  fibroid resection  . DILATION AND CURETTAGE OF UTERUS     x2  . MASS EXCISION N/A 08/04/2017   Procedure: EXCISION LIPOMA UPPER BACK x2 AND NECK;  Surgeon: Erroll Luna, MD;  Location: [place];  Service: General;  Laterality: N/A;  . NASAL SEPTOPLASTY W/ TURBINOPLASTY Bilateral 06/21/2015   Procedure: NASAL SEPTOPLASTY WITH BILATERAL TURBINATE REDUCTION;  Surgeon: Jerrell Belfast, MD;  Location: [place];  Service: ENT;  Laterality: Bilateral;  . SINOSCOPY    . TONSILLECTOMY    . TUBAL LIGATION      There were no vitals filed for this visit.   Subjective Assessment - 09/23/20 1402    Subjective I woke up a little sore today.  Overall, things are feeling much better.    Patient Stated Goals not have this pain and feeling in right arm; strategies to improve that    Currently in Pain? No/denies                             [facility] Adult PT Treatment/Exercise - 09/23/20 0001      Neck Exercises: Machines for Strengthening   UBE (Upper Arm Bike) Level 1.5x 6 minutes (3/3)- PT present to discuss progress  Shoulder Exercises: Supine   Horizontal ABduction Strengthening;Both;20 reps;Theraband    Theraband Level (Shoulder Horizontal ABduction) Level 2 (Red)    External Rotation Strengthening;Both;20 reps;Theraband    Theraband Level (Shoulder External Rotation) Level 2 (Red)    Diagonals Strengthening;Both;20 reps;Theraband    Theraband Level (Shoulder Diagonals) Level 2 (Red)      Shoulder Exercises: Seated   Horizontal ABduction Strengthening;Both;20 reps;Theraband    Theraband Level (Shoulder Horizontal ABduction) Level 1 (Yellow)    External Rotation Strengthening;Both;20 reps;Theraband    Theraband Level (Shoulder External Rotation) Level 1 (Yellow)    Other Seated Exercises 3 way raises: 2# added 2x10      Traction   Type of Traction Cervical    Min (lbs) 8    Max (lbs) 16     Hold Time 60    Rest Time 10    Time 12      Manual Therapy   Manual Therapy Soft tissue mobilization    Manual therapy comments elongation and trigger point release to bil neck and upper traps.                    PT Short Term Goals - 07/15/20 1109      PT SHORT TERM GOAL #1   Title The patient will demonstrate understanding of basic self care strategies including sitting postural alignment, left sidebending for symptom control, basic ex to prevent further strength loss    Status Achieved      PT SHORT TERM GOAL #2   Title The patient will report a 25% improvement in right neck and UE symptoms with ADLs including cooking and sleeping    Baseline 20% improvement    Status On-going      PT SHORT TERM GOAL #3   Title The patient will have improved cervical flexion to 40 degrees and bil rotation to 45 degrees needed for driving    Status On-going             PT Long Term Goals - 08/19/20 1243      PT LONG TERM GOAL #1   Title The patient will be independent with safe self progression of HEP    Period Weeks    Status On-going    Target Date 09/30/20      PT LONG TERM GOAL #2   Title The patient will report a 75% improvement in right neck and UE symptoms with usual ADLs    Baseline 60%    Time 6    Period Weeks    Status Revised    Target Date 09/30/20      PT LONG TERM GOAL #3   Title The patient will have grossly 4+/5 to 5/5 right UE strength  and grip on right to 50# needed for cooking/ lifting things in the kitchen    Baseline met except for IR    Time 6    Status Revised    Target Date 09/30/20      PT LONG TERM GOAL #4   Title Cervical sidebending to right and left to 40 degrees and extension to 40 degrees needed for ADLs including taking care of grandson    Time 6    Period Weeks    Status On-going    Target Date 09/30/20      PT LONG TERM GOAL #5   Title FOTO functional outcome score improved from 36% limitation to 33%    Time 6     Period Weeks  Status On-going    Target Date 09/30/20                 Plan - 09/23/20 1439    Clinical Impression Statement Pt arrived with very mild Rt shoulder pain that is local to the deltoid region.  Pt continues to report 80% overall improvement in symptoms since the start of care. Pt has intermittent Rt shoulder pain that is brief often related to periods of stiffness such as sleeping or sitting too long.  Pt reports that she no longer has to move her head to the Lt for pain reduction.  Pt with mild tension in the cervical spine and had good response and improved tissue mobility after manual therapy.  Pt is responding well to traction and reports relief of Rt UE symptoms after traction. Pt continues to report compliance with HEP for flexibility and strength.  Pt will attend 1 more session and will likely D/C to HEP.    PT Frequency 1x / week    PT Duration 6 weeks    PT Treatment/Interventions ADLs/Self Care Home Management;Cryotherapy;Electrical Stimulation;Ultrasound;Traction;Moist Heat;Iontophoresis 73m/ml Dexamethasone;Therapeutic activities;Therapeutic exercise;Neuromuscular re-education;Manual techniques;Patient/family education;Taping;Dry needling    PT Next Visit Plan 1 more visit- FOTO, finalize HEP, DN and traction    PT Home Exercise Plan [id number]    Consulted and Agree with Plan of Care Patient           Patient will benefit from skilled therapeutic intervention in order to improve the following deficits and impairments:  Decreased range of motion,Increased fascial restricitons,Pain,Decreased strength,Impaired perceived functional ability  Visit Diagnosis: Radiculopathy, cervical region  Muscle weakness (generalized)  Abnormal posture     Problem List Patient Active Problem List   Diagnosis Date Noted  . Mild persistent asthma without complication 149/35/5217 . Mixed rhinitis 06/10/2018  . Obstructive sleep apnea 12/11/2016  . Palpitations 06/17/2016   . Deflected nasal septum 09/13/2015  . Hypertrophy of nasal turbinates 09/13/2015  . Deviated nasal septum 06/21/2015    Class: Chronic  . HYPERLIPIDEMIA 11/18/2007  . Essential hypertension 11/18/2007  . Allergic rhinitis 11/18/2007  . COUGH 11/18/2007    TAKACS,KELLY 09/23/2020, 2:40 PM  [facility] [address] [place] [address]Phone: [phone]  Fax:  [phone] Name: Theresa TIGGSMRN: [id number] of Birth: 4October 17, 1952

## 2020-09-30 ENCOUNTER — Other Ambulatory Visit: Payer: Self-pay

## 2020-09-30 ENCOUNTER — Ambulatory Visit: Payer: Medicare HMO

## 2020-09-30 DIAGNOSIS — M6281 Muscle weakness (generalized): Secondary | ICD-10-CM

## 2020-09-30 DIAGNOSIS — M5412 Radiculopathy, cervical region: Secondary | ICD-10-CM | POA: Diagnosis not present

## 2020-09-30 DIAGNOSIS — R293 Abnormal posture: Secondary | ICD-10-CM | POA: Diagnosis not present

## 2020-09-30 NOTE — Therapy (Signed)
[facility] [address], [place], [place], [address] Phone: [phone]   Fax:  [phone]  Physical Therapy Treatment  Patient Details  Name: [name] MRN: [id number] Date of Birth: July 02, 1951 Referring Provider (PT): Dr. London Pepper   Encounter Date: 09/30/2020   PT End of Session - 09/30/20 1432    Visit Number 16    PT Start Time 1401    PT Stop Time 1444    PT Time Calculation (min) 43 min    Activity Tolerance Patient tolerated treatment well    Behavior During Therapy [facility] for tasks assessed/performed           Past Medical History:  Diagnosis Date  . Asthma   . Chronic back pain   . Diabetes mellitus without complication ([place])    no med  . GERD (gastroesophageal reflux disease)   . Hyperlipemia   . Hypertension   . Hypothyroidism   . Insomnia   . OAB (overactive bladder)   . Seasonal allergies   . Sleep apnea    uses a cpap    Past Surgical History:  Procedure Laterality Date  . ANKLE ARTHROTOMY  2003   left-fx  . APPENDECTOMY    . back fusion  02/14/2019   L4 back fusion  . BACK SURGERY  2010   lumb disk  . BREAST LUMPECTOMY WITH RADIOACTIVE SEED LOCALIZATION Left 06/13/2019   Procedure: LEFT BREAST LUMPECTOMY WITH RADIOACTIVE SEED LOCALIZATION;  Surgeon: Erroll Luna, MD;  [facility];  Service: General;  Laterality: Left;  . CARPAL TUNNEL RELEASE     rt  . CARPAL TUNNEL RELEASE Left 08/01/2013   Procedure: LEFT CARPAL TUNNEL RELEASE;  Surgeon: Wynonia Sours, MD;  Location: [place];  Service: Orthopedics;  Laterality: Left;  . CESAREAN SECTION  83,86  . COLONOSCOPY    . DILATATION & CURETTAGE/HYSTEROSCOPY WITH MYOSURE N/A 08/23/2018   Procedure: DILATATION & CURETTAGE/HYSTEROSCOPY WITH MYOSURE POLYPECTOMY;  Surgeon: Nunzio Cobbs, MD;  Location: [facility];  Service: Gynecology;  Laterality: N/A;  fibroid  resection  . DILATION AND CURETTAGE OF UTERUS     x2  . MASS EXCISION N/A 08/04/2017   Procedure: EXCISION LIPOMA UPPER BACK x2 AND NECK;  Surgeon: Erroll Luna, MD;  Location: [place];  Service: General;  Laterality: N/A;  . NASAL SEPTOPLASTY W/ TURBINOPLASTY Bilateral 06/21/2015   Procedure: NASAL SEPTOPLASTY WITH BILATERAL TURBINATE REDUCTION;  Surgeon: Jerrell Belfast, MD;  Location: [place];  Service: ENT;  Laterality: Bilateral;  . SINOSCOPY    . TONSILLECTOMY    . TUBAL LIGATION      There were no vitals filed for this visit.   Subjective Assessment - 09/30/20 1402    Subjective I am still feeling good.  I am ready for D/C.  85% better.    Pertinent History lumbar fusion 2020;  lipomas right upper trap region;  bil Carpal tunnel release;  PT for neck in 2014 but resolved (no UE symptoms with that)    Currently in Pain? Yes    Pain Score 1     Pain Location Shoulder    Pain Orientation Right    Pain Descriptors / Indicators Aching    Pain Type Chronic pain    Pain Onset More than a month ago    Aggravating Factors  using Rt UE a lot- repetition, occasionally at night    Pain Relieving Factors  not using Rt UE              [facility] PT Assessment - 09/30/20 0001      Assessment   Medical Diagnosis neck pain, right shoulder pain     Referring Provider (PT) Dr. London Pepper      Cognition   Overall Cognitive Status Within Functional Limits for tasks assessed      Observation/Other Assessments   Focus on Therapeutic Outcomes (FOTO)  31% limitation      AROM   Cervical - Right Side Bend 35    Cervical - Left Side Bend 35      Strength   Right Shoulder Flexion 4+/5    Right Shoulder Internal Rotation 4+/5    Right Shoulder External Rotation 4+/5    Right Hand Grip (lbs) 63                         [facility] Adult PT Treatment/Exercise - 09/30/20 0001      Traction   Type of Traction Cervical    Min (lbs) 8    Max (lbs) 16    Hold  Time 60    Rest Time 10    Time 12      Manual Therapy   Manual Therapy Soft tissue mobilization    Manual therapy comments elongation and trigger point release to bil neck and upper traps.            Trigger Point Dry Needling - 09/30/20 0001    Consent Given? Yes    Muscles Treated Head and Neck Upper trapezius;Cervical multifidi    Dry Needling Comments Rt and Lt: C3-T3    Upper Trapezius Response Twitch reponse elicited;Palpable increased muscle length   Lt upper traps   Cervical multifidi Response Twitch reponse elicited;Palpable increased muscle length                  PT Short Term Goals - 07/15/20 1109      PT SHORT TERM GOAL #1   Title The patient will demonstrate understanding of basic self care strategies including sitting postural alignment, left sidebending for symptom control, basic ex to prevent further strength loss    Status Achieved      PT SHORT TERM GOAL #2   Title The patient will report a 25% improvement in right neck and UE symptoms with ADLs including cooking and sleeping    Baseline 20% improvement    Status On-going      PT SHORT TERM GOAL #3   Title The patient will have improved cervical flexion to 40 degrees and bil rotation to 45 degrees needed for driving    Status On-going             PT Long Term Goals - 09/30/20 1404      PT LONG TERM GOAL #1   Title The patient will be independent with safe self progression of HEP    Status Achieved      PT LONG TERM GOAL #2   Title The patient will report a 75% improvement in right neck and UE symptoms with usual ADLs    Baseline 85%    Status Achieved      PT LONG TERM GOAL #3   Title The patient will have grossly 4+/5 to 5/5 right UE strength  and grip on right to 50# needed for cooking/ lifting things in the kitchen    Status Achieved  PT LONG TERM GOAL #4   Title Cervical sidebending to right and left to 40 degrees and extension to 40 degrees needed for ADLs including taking  care of grandson    Baseline 35 degrees sidebending    Status Partially Met      PT LONG TERM GOAL #5   Title FOTO functional outcome score improved from 36% limitation to 33%    Baseline 31%    Status Achieved                 Plan - 09/30/20 1436    Clinical Impression Statement Pt is ready to D/C to HEP today.  Pt reports 85% overall improvement in symptoms since the start of care. Pt has intermittent Rt shoulder pain that is brief often related to periods of stiffness such as sleeping or sitting too long and repetitive use of the Rt UE.  Pt reports that she no longer has to move her head to the Lt for pain reduction. Rt shoulder strength and FOTO score have both improved and goals met. Pt with mild tension in the cervical spine and had good response and improved tissue mobility after manual therapy. Pt will D/C to HEP today.    PT Next Visit Plan D/C PT to HEP    PT Home Exercise Plan [id number]    Consulted and Agree with Plan of Care Patient           Patient will benefit from skilled therapeutic intervention in order to improve the following deficits and impairments:     Visit Diagnosis: Abnormal posture  Radiculopathy, cervical region  Muscle weakness (generalized)     Problem List Patient Active Problem List   Diagnosis Date Noted  . Mild persistent asthma without complication 67/22/7737  . Mixed rhinitis 06/10/2018  . Obstructive sleep apnea 12/11/2016  . Palpitations 06/17/2016  . Deflected nasal septum 09/13/2015  . Hypertrophy of nasal turbinates 09/13/2015  . Deviated nasal septum 06/21/2015    Class: Chronic  . HYPERLIPIDEMIA 11/18/2007  . Essential hypertension 11/18/2007  . Allergic rhinitis 11/18/2007  . COUGH 11/18/2007    PHYSICAL THERAPY DISCHARGE SUMMARY  Visits from Start of Care: 16  Current functional level related to goals / functional outcomes: See above for current status.     Remaining deficits: No functional deficits at this  time.     Education / Equipment: HEP, posture/mechanics Plan: Patient agrees to discharge.  Patient goals were met. Patient is being discharged due to meeting the stated rehab goals.  ?????         Sigurd Sos, [place] 09/30/20 2:37 PM  [facility] [address] [place], [place], [address] Phone: [phone]   Fax:  [phone]  Name: ELISHIA KACZOROWSKI MRN: [id number] Date of Birth: Mar 24, 1951

## 2020-10-09 DIAGNOSIS — G4733 Obstructive sleep apnea (adult) (pediatric): Secondary | ICD-10-CM | POA: Diagnosis not present

## 2020-11-25 DIAGNOSIS — Z1231 Encounter for screening mammogram for malignant neoplasm of breast: Secondary | ICD-10-CM | POA: Diagnosis not present

## 2020-12-09 DIAGNOSIS — D225 Melanocytic nevi of trunk: Secondary | ICD-10-CM | POA: Diagnosis not present

## 2020-12-09 DIAGNOSIS — L821 Other seborrheic keratosis: Secondary | ICD-10-CM | POA: Diagnosis not present

## 2020-12-09 DIAGNOSIS — D2271 Melanocytic nevi of right lower limb, including hip: Secondary | ICD-10-CM | POA: Diagnosis not present

## 2020-12-09 DIAGNOSIS — L814 Other melanin hyperpigmentation: Secondary | ICD-10-CM | POA: Diagnosis not present

## 2020-12-09 DIAGNOSIS — D239 Other benign neoplasm of skin, unspecified: Secondary | ICD-10-CM | POA: Diagnosis not present

## 2020-12-09 DIAGNOSIS — L578 Other skin changes due to chronic exposure to nonionizing radiation: Secondary | ICD-10-CM | POA: Diagnosis not present

## 2020-12-09 DIAGNOSIS — D2262 Melanocytic nevi of left upper limb, including shoulder: Secondary | ICD-10-CM | POA: Diagnosis not present

## 2020-12-09 DIAGNOSIS — L853 Xerosis cutis: Secondary | ICD-10-CM | POA: Diagnosis not present

## 2020-12-09 DIAGNOSIS — L820 Inflamed seborrheic keratosis: Secondary | ICD-10-CM | POA: Diagnosis not present

## 2020-12-09 DIAGNOSIS — L918 Other hypertrophic disorders of the skin: Secondary | ICD-10-CM | POA: Diagnosis not present

## 2020-12-09 DIAGNOSIS — L570 Actinic keratosis: Secondary | ICD-10-CM | POA: Diagnosis not present

## 2021-01-04 ENCOUNTER — Other Ambulatory Visit: Payer: Self-pay | Admitting: Allergy

## 2021-01-11 DIAGNOSIS — G4733 Obstructive sleep apnea (adult) (pediatric): Secondary | ICD-10-CM | POA: Diagnosis not present

## 2021-01-15 ENCOUNTER — Ambulatory Visit: Payer: Medicare HMO | Admitting: Allergy

## 2021-01-17 ENCOUNTER — Other Ambulatory Visit: Payer: Self-pay

## 2021-01-17 ENCOUNTER — Encounter: Payer: Self-pay | Admitting: Allergy

## 2021-01-17 ENCOUNTER — Ambulatory Visit: Payer: Medicare HMO | Admitting: Allergy

## 2021-01-17 VITALS — BP 118/72 | HR 58 | Temp 97.9°F | Resp 18

## 2021-01-17 DIAGNOSIS — J310 Chronic rhinitis: Secondary | ICD-10-CM | POA: Diagnosis not present

## 2021-01-17 DIAGNOSIS — J4530 Mild persistent asthma, uncomplicated: Secondary | ICD-10-CM

## 2021-01-17 MED ORDER — LEVOCETIRIZINE DIHYDROCHLORIDE 5 MG PO TABS: ORAL_TABLET | ORAL | 1 refills | Status: DC

## 2021-01-17 MED ORDER — MONTELUKAST SODIUM 10 MG PO TABS: 10.0000 mg | ORAL_TABLET | Freq: Every day | ORAL | 5 refills | Status: DC

## 2021-01-17 NOTE — Patient Instructions (Addendum)
Asthma  - doing well at this time  - continue Flovent 110 g  1 puff once a day.   If you note increase in asthma symptoms or not meeting below goals go back to 2 puffs daily.   - asthma action plan if having asthma flare: take Flovent 2 puffs twice a day   - Continue Singulair 10 mg daily  - have access to albuterol inhaler 2 puffs every 4-6 hours as needed for cough/wheeze/shortness of breath/chest tightness.  May use 15-20 minutes prior to activity.   Monitor frequency of use.    Asthma control goals:  Full participation in all desired activities (may need albuterol before activity) Albuterol use two time or less a week on average (not counting use with activity) Cough interfering with sleep two time or less a month Oral steroids no more than once a year No hospitalizations    Mixed rhinitis  - Continue Singulair as above  - continue Xyzal 5mg  in evening to help minimize sneezing episodes.  May try to take every other day.   - Continue Flonase 1-2 sprays each nostril daily as needed for congestion.  Use for 1-2 weeks at a time before stopping once symptoms improved.   - Continue Astelin 1-2 sprays 1-2 times a day as needed for nasal drainage/post-nasal drip   Follow-up in 6 months or sooner if needed

## 2021-01-17 NOTE — Progress Notes (Signed)
Follow-up Note  RE: PARADISE VENSEL MRN: [id number] DOB: May 19, 1951 Date of Office Visit: 01/17/2021   History of present illness: [name] is a 70 y.o. female presenting today for follow-up of asthma and mixed rhinitis.  She was last seen in the office on 07/17/2020 by myself.  She has not had any major health changes, surgeries or hospitalizations since last visit. She states she does note some mild shortness of breath when she goes walking outside and is very humid.  She states when she gets back in from her exercise she rests and this resolves.  She has not used her albuterol to resolve symptoms or prior to to prevent symptoms.  She states she cannot really tell a difference since moving down to Flovent 110 mcg 1 puff once a day but she feels like this has been maintaining her asthma control.  She has not needed to go back up for any illnesses or flares.  She denies any nighttime awakenings.  She has not required any ED or urgent care visits or any systemic steroid needs. She states her mixed rhinitis symptoms are controlled with Xyzal and now only needing to use Astelin in the evenings.  She does feel the Xyzal is more effective than the other antihistamine she has tried before.  She is not currently needing to use fluticasone spray.     Review of systems: Review of Systems  Constitutional: Negative.   HENT: Negative.    Eyes: Negative.   Respiratory:  Positive for shortness of breath. Negative for cough, sputum production and wheezing.   Cardiovascular: Negative.   Gastrointestinal: Negative.   Musculoskeletal: Negative.   Skin: Negative.   Neurological: Negative.    All other systems negative unless noted above in HPI  Past medical/social/surgical/family history have been reviewed and are unchanged unless specifically indicated below.  No changes  Medication List: Current Outpatient Medications  Medication Sig Dispense Refill   Acetaminophen (TYLENOL PO) Take by  mouth. Takes 500mg  qam and 2 tablets qhs     albuterol (PROAIR HFA) 108 (90 Base) MCG/ACT inhaler Inhale 2 puffs into the lungs every 4 (four) hours as needed for wheezing or shortness of breath. 1 each 3   amLODipine (NORVASC) 5 MG tablet Take 5 mg by mouth daily.     azelastine (ASTELIN) 0.1 % nasal spray Place 2 sprays into both nostrils 2 (two) times daily. Use in each nostril as directed 60 mL 5   baclofen (LIORESAL) 20 MG tablet Take 20 mg by mouth 3 (three) times daily as needed.     CINNAMON PO 2 capsules Orally once a day     estradiol (ESTRACE) 0.1 MG/GM vaginal cream Place 1/2 gram per vagina at bed time twice weekly. 42.5 g 1   estradiol (ESTRACE) 0.1 MG/GM vaginal cream See admin instructions.     famotidine-calcium carbonate-magnesium hydroxide (PEPCID COMPLETE) 10-800-165 MG chewable tablet 1 tablet Orally Twice a day     fluticasone (FLOVENT HFA) 110 MCG/ACT inhaler Inhale 1 puff into the lungs daily. 12 g 5   gabapentin (NEURONTIN) 400 MG capsule Take 1 capsule by mouth 4 (four) times daily.     GLUCOSAMINE-CHONDROITIN PO Take 1 tablet by mouth daily. Move Free Ultra Triple Action     glucose blood test strip as directed finger stick once a day for 90 days     hydrochlorothiazide (MICROZIDE) 12.5 MG capsule Take 12.5 mg by mouth daily.     hydrocortisone-pramoxine (PROCTOFOAM-HC) rectal foam  1 application as needed Rectal Four times a day if needed for hemorrhoid for 7 days     levocetirizine (XYZAL) 5 MG tablet TAKE 1 TABLET BY MOUTH EVERY EVENING 90 tablet 0   levothyroxine (SYNTHROID) 112 MCG tablet 1 tablet on an empty stomach in the morning     losartan (COZAAR) 100 MG tablet Take 100 mg by mouth daily.     Melatonin 3 MG TABS Take 3 mg by mouth as needed.     meloxicam (MOBIC) 7.5 MG tablet 1 tablet     metFORMIN (GLUCOPHAGE-XR) 500 MG 24 hr tablet 1 tablet with evening meal Orally Once a day for 90 days     montelukast (SINGULAIR) 10 MG tablet Take 10 mg by mouth at  bedtime.     Multiple Vitamin (MULTIVITAMIN WITH MINERALS) TABS tablet Take 1 tablet by mouth at bedtime.     Omega-3 Fatty Acids (FISH OIL) 1200 MG CAPS Take 1,200 mg by mouth daily.     sertraline (ZOLOFT) 50 MG tablet Take 50 mg by mouth daily.     simvastatin (ZOCOR) 20 MG tablet Take 20 mg by mouth at bedtime.      Trospium Chloride 60 MG CP24 Take 1 capsule by mouth every morning.     Turmeric 500 MG CAPS 2 capsules Orally once a day     zolpidem (AMBIEN) 10 MG tablet Take 5 mg by mouth at bedtime as needed for sleep.      No current facility-administered medications for this visit.     Known medication allergies: Allergies  Allergen Reactions   Oxycodone-Acetaminophen Nausea Only     Physical examination: Blood pressure 118/72, pulse (!) 58, temperature 97.9 F (36.6 C), temperature source Temporal, resp. rate 18, last menstrual period 07/06/1998, SpO2 96 %.  General: Alert, interactive, in no acute distress. HEENT: PERRLA, TMs pearly gray, turbinates non-edematous without discharge, post-pharynx unremarkable. Neck: Supple without lymphadenopathy. Lungs: Clear to auscultation without wheezing, rhonchi or rales. {no increased work of breathing. CV: Normal S1, S2 without murmurs. Abdomen: Nondistended, nontender. Skin: Warm and dry, without lesions or rashes. Extremities:  No clubbing, cyanosis or edema. Neuro:   Grossly intact.  Diagnositics/Labs:  ACT score 24 which indicates good control  Spirometry: FEV1: 2.08 L 82%, FVC: 2.29 L 69% predicted.  She did have evidence of coughing on her flow-volume loop  Assessment and plan:   Asthma  - doing well at this time  - continue Flovent 110 g  1 puff once a day.   If you note increase in asthma symptoms or not meeting below goals go back to 2 puffs daily.   - asthma action plan if having asthma flare: take Flovent 2 puffs twice a day   - Continue Singulair 10 mg daily  - have access to albuterol inhaler 2 puffs every  4-6 hours as needed for cough/wheeze/shortness of breath/chest tightness.  May use 15-20 minutes prior to activity.   Monitor frequency of use.    Asthma control goals:  Full participation in all desired activities (may need albuterol before activity) Albuterol use two time or less a week on average (not counting use with activity) Cough interfering with sleep two time or less a month Oral steroids no more than once a year No hospitalizations    Mixed rhinitis  - Continue Singulair as above  - continue Xyzal 5mg  in evening to help minimize sneezing episodes.  May try to take every other day.   - Continue Flonase  1-2 sprays each nostril daily as needed for congestion.  Use for 1-2 weeks at a time before stopping once symptoms improved.   - Continue Astelin 1-2 sprays 1-2 times a day as needed for nasal drainage/post-nasal drip   Follow-up in 6 months or sooner if needed  I appreciate the opportunity to take part in Devine's care. Please do not hesitate to contact me with questions.  Sincerely,   Prudy Feeler, MD Allergy/Immunology [facility]

## 2021-01-20 DIAGNOSIS — G8929 Other chronic pain: Secondary | ICD-10-CM | POA: Diagnosis not present

## 2021-01-20 DIAGNOSIS — G4700 Insomnia, unspecified: Secondary | ICD-10-CM | POA: Diagnosis not present

## 2021-01-20 DIAGNOSIS — E039 Hypothyroidism, unspecified: Secondary | ICD-10-CM | POA: Diagnosis not present

## 2021-01-20 DIAGNOSIS — E1142 Type 2 diabetes mellitus with diabetic polyneuropathy: Secondary | ICD-10-CM | POA: Diagnosis not present

## 2021-01-20 DIAGNOSIS — R69 Illness, unspecified: Secondary | ICD-10-CM | POA: Diagnosis not present

## 2021-01-20 DIAGNOSIS — K219 Gastro-esophageal reflux disease without esophagitis: Secondary | ICD-10-CM | POA: Diagnosis not present

## 2021-01-20 DIAGNOSIS — I10 Essential (primary) hypertension: Secondary | ICD-10-CM | POA: Diagnosis not present

## 2021-01-20 DIAGNOSIS — E785 Hyperlipidemia, unspecified: Secondary | ICD-10-CM | POA: Diagnosis not present

## 2021-01-20 DIAGNOSIS — J45909 Unspecified asthma, uncomplicated: Secondary | ICD-10-CM | POA: Diagnosis not present

## 2021-01-24 DIAGNOSIS — I10 Essential (primary) hypertension: Secondary | ICD-10-CM | POA: Diagnosis not present

## 2021-01-24 DIAGNOSIS — E119 Type 2 diabetes mellitus without complications: Secondary | ICD-10-CM | POA: Diagnosis not present

## 2021-01-24 DIAGNOSIS — E039 Hypothyroidism, unspecified: Secondary | ICD-10-CM | POA: Diagnosis not present

## 2021-01-24 DIAGNOSIS — E782 Mixed hyperlipidemia: Secondary | ICD-10-CM | POA: Diagnosis not present

## 2021-01-29 DIAGNOSIS — E785 Hyperlipidemia, unspecified: Secondary | ICD-10-CM | POA: Diagnosis not present

## 2021-01-29 DIAGNOSIS — I10 Essential (primary) hypertension: Secondary | ICD-10-CM | POA: Diagnosis not present

## 2021-01-29 DIAGNOSIS — E1165 Type 2 diabetes mellitus with hyperglycemia: Secondary | ICD-10-CM | POA: Diagnosis not present

## 2021-01-30 DIAGNOSIS — M545 Low back pain, unspecified: Secondary | ICD-10-CM | POA: Diagnosis not present

## 2021-01-30 DIAGNOSIS — M4326 Fusion of spine, lumbar region: Secondary | ICD-10-CM | POA: Diagnosis not present

## 2021-01-30 DIAGNOSIS — I10 Essential (primary) hypertension: Secondary | ICD-10-CM | POA: Diagnosis not present

## 2021-01-30 DIAGNOSIS — M4722 Other spondylosis with radiculopathy, cervical region: Secondary | ICD-10-CM | POA: Diagnosis not present

## 2021-01-30 DIAGNOSIS — Z6837 Body mass index (BMI) 37.0-37.9, adult: Secondary | ICD-10-CM | POA: Diagnosis not present

## 2021-02-11 DIAGNOSIS — G4733 Obstructive sleep apnea (adult) (pediatric): Secondary | ICD-10-CM | POA: Diagnosis not present

## 2021-03-14 DIAGNOSIS — G4733 Obstructive sleep apnea (adult) (pediatric): Secondary | ICD-10-CM | POA: Diagnosis not present

## 2021-03-14 DIAGNOSIS — E782 Mixed hyperlipidemia: Secondary | ICD-10-CM | POA: Diagnosis not present

## 2021-03-14 DIAGNOSIS — R69 Illness, unspecified: Secondary | ICD-10-CM | POA: Diagnosis not present

## 2021-03-14 DIAGNOSIS — E669 Obesity, unspecified: Secondary | ICD-10-CM | POA: Diagnosis not present

## 2021-03-14 DIAGNOSIS — I10 Essential (primary) hypertension: Secondary | ICD-10-CM | POA: Diagnosis not present

## 2021-03-17 DIAGNOSIS — Z713 Dietary counseling and surveillance: Secondary | ICD-10-CM | POA: Diagnosis not present

## 2021-03-17 DIAGNOSIS — Z6837 Body mass index (BMI) 37.0-37.9, adult: Secondary | ICD-10-CM | POA: Diagnosis not present

## 2021-03-21 DIAGNOSIS — H524 Presbyopia: Secondary | ICD-10-CM | POA: Diagnosis not present

## 2021-03-21 DIAGNOSIS — E119 Type 2 diabetes mellitus without complications: Secondary | ICD-10-CM | POA: Diagnosis not present

## 2021-03-27 ENCOUNTER — Other Ambulatory Visit: Payer: Self-pay

## 2021-03-27 NOTE — Telephone Encounter (Signed)
AEX 07/16/20.

## 2021-03-27 NOTE — Telephone Encounter (Signed)
AEX 07/16/20.  I called [facility]. Patient had normal mammo 11/25/2020. I asked them to fax report over so we can have in her chart.

## 2021-03-28 ENCOUNTER — Encounter: Payer: Self-pay | Admitting: Obstetrics and Gynecology

## 2021-04-02 MED ORDER — ESTRADIOL 0.1 MG/GM VA CREA: TOPICAL_CREAM | VAGINAL | 0 refills | Status: DC

## 2021-04-02 NOTE — Telephone Encounter (Signed)
Mammogram from [facility] 11/25/20 received:  BI-RADS2.   I refilled her estrogen cream.

## 2021-04-02 NOTE — Telephone Encounter (Signed)
Patient called to follow up on refill request °

## 2021-04-05 DIAGNOSIS — Z8616 Personal history of COVID-19: Secondary | ICD-10-CM

## 2021-04-05 HISTORY — DX: Personal history of COVID-19: Z86.16

## 2021-04-11 DIAGNOSIS — G4733 Obstructive sleep apnea (adult) (pediatric): Secondary | ICD-10-CM | POA: Diagnosis not present

## 2021-04-13 DIAGNOSIS — G4733 Obstructive sleep apnea (adult) (pediatric): Secondary | ICD-10-CM | POA: Diagnosis not present

## 2021-05-12 DIAGNOSIS — G4733 Obstructive sleep apnea (adult) (pediatric): Secondary | ICD-10-CM | POA: Diagnosis not present

## 2021-05-14 DIAGNOSIS — G4733 Obstructive sleep apnea (adult) (pediatric): Secondary | ICD-10-CM | POA: Diagnosis not present

## 2021-05-19 DIAGNOSIS — E669 Obesity, unspecified: Secondary | ICD-10-CM | POA: Diagnosis not present

## 2021-05-19 DIAGNOSIS — I10 Essential (primary) hypertension: Secondary | ICD-10-CM | POA: Diagnosis not present

## 2021-05-19 DIAGNOSIS — H6122 Impacted cerumen, left ear: Secondary | ICD-10-CM | POA: Diagnosis not present

## 2021-05-19 DIAGNOSIS — Z7985 Long-term (current) use of injectable non-insulin antidiabetic drugs: Secondary | ICD-10-CM | POA: Diagnosis not present

## 2021-05-19 DIAGNOSIS — E119 Type 2 diabetes mellitus without complications: Secondary | ICD-10-CM | POA: Diagnosis not present

## 2021-05-27 ENCOUNTER — Other Ambulatory Visit: Payer: Self-pay | Admitting: Obstetrics and Gynecology

## 2021-06-11 DIAGNOSIS — G4733 Obstructive sleep apnea (adult) (pediatric): Secondary | ICD-10-CM | POA: Diagnosis not present

## 2021-06-13 DIAGNOSIS — G4733 Obstructive sleep apnea (adult) (pediatric): Secondary | ICD-10-CM | POA: Diagnosis not present

## 2021-07-04 DIAGNOSIS — G4733 Obstructive sleep apnea (adult) (pediatric): Secondary | ICD-10-CM | POA: Diagnosis not present

## 2021-07-23 ENCOUNTER — Ambulatory Visit (INDEPENDENT_AMBULATORY_CARE_PROVIDER_SITE_OTHER): Payer: PPO | Admitting: Obstetrics and Gynecology

## 2021-07-23 ENCOUNTER — Encounter: Payer: Self-pay | Admitting: Obstetrics and Gynecology

## 2021-07-23 ENCOUNTER — Telehealth: Payer: Self-pay | Admitting: Obstetrics and Gynecology

## 2021-07-23 ENCOUNTER — Other Ambulatory Visit: Payer: Self-pay

## 2021-07-23 VITALS — BP 138/74 | HR 78 | Ht 65.5 in | Wt 228.0 lb

## 2021-07-23 DIAGNOSIS — N898 Other specified noninflammatory disorders of vagina: Secondary | ICD-10-CM

## 2021-07-23 DIAGNOSIS — N3281 Overactive bladder: Secondary | ICD-10-CM | POA: Diagnosis not present

## 2021-07-23 DIAGNOSIS — Z01419 Encounter for gynecological examination (general) (routine) without abnormal findings: Secondary | ICD-10-CM

## 2021-07-23 DIAGNOSIS — Z5181 Encounter for therapeutic drug level monitoring: Secondary | ICD-10-CM | POA: Diagnosis not present

## 2021-07-23 MED ORDER — ESTRADIOL 0.1 MG/GM VA CREA: TOPICAL_CREAM | VAGINAL | 1 refills | Status: DC

## 2021-07-23 NOTE — Telephone Encounter (Signed)
Please make referral to Dr. Matilde Sprang for overactive bladder.   I have placed this order.

## 2021-07-23 NOTE — Patient Instructions (Signed)

## 2021-07-23 NOTE — Telephone Encounter (Signed)
Office notes faxed to [facility] they will call to schedule. 

## 2021-07-23 NOTE — Progress Notes (Signed)
71 y.o. G15P0012 Married Caucasian female here for annual exam.    Denies vaginal bleeding or unusual discharge.   Feels a migraine starting.  Took some Tylenol here at the office.   She is followed for vaginal atrophy.  Using vaginal estrogen cream.  Needs refill.   Taking Trospium for overactive bladder, and it is working well. If she does not take one does, she notices an increase in her symptoms.  She is concerned about dementia and the use of anticholinergics.   Daughter in law dx with breast cancer.   PCP:   Maurice Small, MD  Patient's last menstrual period was 07/06/1998 (approximate).           Sexually active: Yes.    The current method of family planning is post menopausal status.    Exercising: No.   walking Smoker:  no  Health Maintenance: Pap:  07-16-20 Neg, 07-04-18 Neg, 06-01-16 Neg:Neg HR HPV History of abnormal Pap:  no MMG:  11-25-20 bil.benign calcifications/Neg/BiRads2 Colonoscopy:  2021 polyps;next 2024 BMD:  2018  Result :Normal per patient TDaP:  07-02-17 Gardasil:   no HIV: no Hep C:no Screening Labs:  PCP.  Flu vaccine:  completed.  Shingrix:  completed.  Bivalent Covid booster:  completed.   reports that she has never smoked. She has never used smokeless tobacco. She reports current alcohol use. She reports that she does not use drugs.  Past Medical History:  Diagnosis Date   Asthma    Chronic back pain    Diabetes mellitus without complication ([place])    no med   GERD (gastroesophageal reflux disease)    History of COVID-19 04/05/2021   Hyperlipemia    Hypertension    Hypothyroidism    Insomnia    OAB (overactive bladder)    Seasonal allergies    Sleep apnea    uses a cpap    Past Surgical History:  Procedure Laterality Date   ANKLE ARTHROTOMY  2003   left-fx   APPENDECTOMY     back fusion  02/14/2019   L4 back fusion   BACK SURGERY  2010   lumb disk   BREAST LUMPECTOMY WITH RADIOACTIVE SEED LOCALIZATION Left 06/13/2019    Procedure: LEFT BREAST LUMPECTOMY WITH RADIOACTIVE SEED LOCALIZATION;  Surgeon: Erroll Luna, MD;  Location: [facility];  Service: General;  Laterality: Left;   CARPAL TUNNEL RELEASE     rt   CARPAL TUNNEL RELEASE Left 08/01/2013   Procedure: LEFT CARPAL TUNNEL RELEASE;  Surgeon: Wynonia Sours, MD;  Location: [facility];  Service: Orthopedics;  Laterality: Left;   CESAREAN SECTION  83,86   COLONOSCOPY     DILATATION & CURETTAGE/HYSTEROSCOPY WITH MYOSURE N/A 08/23/2018   Procedure: DILATATION & CURETTAGE/HYSTEROSCOPY WITH MYOSURE POLYPECTOMY;  Surgeon: Nunzio Cobbs, MD;  Location: [facility];  Service: Gynecology;  Laterality: N/A;  fibroid resection   DILATION AND CURETTAGE OF UTERUS     x2   MASS EXCISION N/A 08/04/2017   Procedure: EXCISION LIPOMA UPPER BACK x2 AND NECK;  Surgeon: Erroll Luna, MD;  Location: [facility];  Service: General;  Laterality: N/A;   NASAL SEPTOPLASTY W/ TURBINOPLASTY Bilateral 06/21/2015   Procedure: NASAL SEPTOPLASTY WITH BILATERAL TURBINATE REDUCTION;  Surgeon: Jerrell Belfast, MD;  Location: [facility];  Service: ENT;  Laterality: Bilateral;   SINOSCOPY     TONSILLECTOMY     TUBAL LIGATION      Current Outpatient Medications  Medication Sig  Dispense Refill   Acetaminophen (TYLENOL PO) Take by mouth. Takes 543m qam and 2 tablets qhs     albuterol (PROAIR HFA) 108 (90 Base) MCG/ACT inhaler Inhale 2 puffs into the lungs every 4 (four) hours as needed for wheezing or shortness of breath. 1 each 3   amLODipine (NORVASC) 5 MG tablet Take 5 mg by mouth daily.     azelastine (ASTELIN) 0.1 % nasal spray Place 2 sprays into both nostrils 2 (two) times daily. Use in each nostril as directed 60 mL 5   baclofen (LIORESAL) 20 MG tablet Take 20 mg by mouth 3 (three) times daily as needed.     CINNAMON PO 2 capsules Orally once a day     estradiol (ESTRACE) 0.1 MG/GM vaginal cream Place 1/2 gram per  vagina at bed time twice weekly. 42.5 g 0   famotidine-calcium carbonate-magnesium hydroxide (PEPCID COMPLETE) 10-800-165 MG chewable tablet 1 tablet Orally Twice a day     fluticasone (FLOVENT HFA) 110 MCG/ACT inhaler Inhale 1 puff into the lungs daily. 12 g 5   gabapentin (NEURONTIN) 400 MG capsule Take 1 capsule by mouth 4 (four) times daily.     GLUCOSAMINE-CHONDROITIN PO Take 1 tablet by mouth daily. Move Free Ultra Triple Action     glucose blood test strip as directed finger stick once a day for 90 days     hydrochlorothiazide (MICROZIDE) 12.5 MG capsule 1 1/2     hydrocortisone-pramoxine (PROCTOFOAM-HC) rectal foam 1 application as needed Rectal Four times a day if needed for hemorrhoid for 7 days     levocetirizine (XYZAL) 5 MG tablet TAKE 1 TABLET BY MOUTH EVERY EVENING 90 tablet 1   levothyroxine (SYNTHROID) 112 MCG tablet 1 tablet on an empty stomach in the morning     losartan (COZAAR) 100 MG tablet Take 100 mg by mouth daily.     Melatonin 3 MG TABS Take 3 mg by mouth as needed.     metFORMIN (GLUCOPHAGE-XR) 500 MG 24 hr tablet 1 tablet with evening meal Orally Once a day for 90 days     montelukast (SINGULAIR) 10 MG tablet Take 1 tablet (10 mg total) by mouth at bedtime. 30 tablet 5   Multiple Vitamin (MULTIVITAMIN WITH MINERALS) TABS tablet Take 1 tablet by mouth at bedtime.     Omega-3 Fatty Acids (FISH OIL) 1200 MG CAPS Take 1,200 mg by mouth daily.     Semaglutide, 1 MG/DOSE, (OZEMPIC, 1 MG/DOSE,) 4 MG/3ML SOPN 1 mg     sertraline (ZOLOFT) 50 MG tablet Take 50 mg by mouth daily.     simvastatin (ZOCOR) 20 MG tablet Take 20 mg by mouth at bedtime.      Trospium Chloride 60 MG CP24 Take 1 capsule by mouth every morning.     Turmeric 500 MG CAPS 2 capsules Orally once a day     zolpidem (AMBIEN) 10 MG tablet Take 5 mg by mouth at bedtime as needed for sleep.      No current facility-administered medications for this visit.    Family History  Problem Relation Age of Onset    Allergic rhinitis Father    Allergic rhinitis Sister    Asthma Sister    Uterine cancer Sister 651  Asthma Paternal Aunt    Multiple myeloma Sister    Angioedema Neg Hx    Eczema Neg Hx    Immunodeficiency Neg Hx    Urticaria Neg Hx     Review of Systems  All other systems reviewed and are negative.  Exam:   BP 138/74    Pulse 78    Ht 5' 5.5" (1.664 m)    Wt 228 lb (103.4 kg)    LMP 07/06/1998 (Approximate)    SpO2 94%    BMI 37.36 kg/m     General appearance: alert, cooperative and appears stated age Head: normocephalic, without obvious abnormality, atraumatic Neck: no adenopathy, supple, symmetrical, trachea midline and thyroid normal to inspection and palpation Lungs: clear to auscultation bilaterally Breasts: normal appearance, no masses or tenderness, No nipple retraction or dimpling, No nipple discharge or bleeding, No axillary adenopathy Heart: regular rate and rhythm Abdomen: soft, non-tender; no masses, no organomegaly Extremities: extremities normal, atraumatic, no cyanosis or edema Skin: skin color, texture, turgor normal. No rashes or lesions Lymph nodes: cervical, supraclavicular, and axillary nodes normal. Neurologic: grossly normal  Pelvic: External genitalia:  no lesions              No abnormal inguinal nodes palpated.              Urethra:  normal appearing urethra with no masses, tenderness or lesions              Bartholins and Skenes: normal                 Vagina: normal appearing vagina with normal color and discharge, 4 - 5 mm left superior vaginal lesion              Cervix: no lesions              Pap taken: no Bimanual Exam:  Uterus:  normal size, contour, position, consistency, mobility, non-tender              Adnexa: no mass, fullness, tenderness              Rectal exam: yes.  Confirms.              Anus:  normal sphincter tone, no lesions  Chaperone was present for exam:  Estill Bamberg, CMA  Assessment:   Well woman visit with gynecologic  exam. Status post left breast lumpectomy for intraductal papilloma, fibroadenoma, and fibrocystic change with sclerosing adenosis. Status post hysteroscopy with polypectomy, dilation and curettage. FH uterine cancer.  Vaginal lesion. Vaginal atrophy.  Using Vaginal estrogen cream.  Encounter for medication monitoring.  Overactive bladder. Using Trospium.    Plan: Mammogram screening discussed. Self breast awareness reviewed. Pap and HR HPV as above. Guidelines for Calcium, Vitamin D, regular exercise program including cardiovascular and weight bearing exercise. Return for vaginal biopsy.  Rationale explained.  Referral to urology for overactive bladder.  Refill of vaginal estrogen cream.  I discussed potential effect on breast cancer.  Follow up annually and prn.   After visit summary provided.   25 min  total time was spent for this patient encounter, including preparation, face-to-face counseling with the patient, coordination of care, and documentation of the encounter.

## 2021-07-24 ENCOUNTER — Ambulatory Visit: Payer: PPO | Admitting: Allergy

## 2021-07-24 ENCOUNTER — Encounter: Payer: Self-pay | Admitting: Allergy

## 2021-07-24 ENCOUNTER — Ambulatory Visit: Payer: Medicare HMO | Admitting: Allergy

## 2021-07-24 VITALS — BP 120/68 | HR 83 | Temp 97.8°F | Resp 20 | Ht 65.0 in | Wt 229.6 lb

## 2021-07-24 DIAGNOSIS — J310 Chronic rhinitis: Secondary | ICD-10-CM

## 2021-07-24 DIAGNOSIS — J4530 Mild persistent asthma, uncomplicated: Secondary | ICD-10-CM

## 2021-07-24 MED ORDER — MONTELUKAST SODIUM 10 MG PO TABS: 10.0000 mg | ORAL_TABLET | Freq: Every day | ORAL | 5 refills | Status: DC

## 2021-07-24 MED ORDER — LEVOCETIRIZINE DIHYDROCHLORIDE 5 MG PO TABS
ORAL_TABLET | ORAL | 1 refills | Status: DC
Start: 2021-07-24 — End: 2022-07-29

## 2021-07-24 MED ORDER — FLUTICASONE PROPIONATE HFA 44 MCG/ACT IN AERO: 1.0000 | INHALATION_SPRAY | Freq: Every day | RESPIRATORY_TRACT | 5 refills | Status: DC

## 2021-07-24 MED ORDER — OXYMETAZOLINE HCL 0.05 % NA SOLN: 2.0000 | Freq: Every day | NASAL | 5 refills | Status: DC

## 2021-07-24 NOTE — Patient Instructions (Addendum)
Asthma  - doing well at this time  - will try to wean down to Flovent 44 g  1 puff once a day.   If you note increase in asthma symptoms or not meeting below goals go back to Flovent 110 dose  - asthma action plan if having asthma flare: take Flovent 2 puffs twice a day   - Continue Singulair 10 mg daily  - have access to albuterol inhaler 2 puffs every 4-6 hours as needed for cough/wheeze/shortness of breath/chest tightness.  May use 15-20 minutes prior to activity.   Monitor frequency of use.    Asthma control goals:  Full participation in all desired activities (may need albuterol before activity) Albuterol use two time or less a week on average (not counting use with activity) Cough interfering with sleep two time or less a month Oral steroids no more than once a year No hospitalizations    Mixed rhinitis  - Continue Singulair as above  - continue Xyzal 5mg  in evening to help minimize sneezing episodes.  May try to take every other day.   - nose looks very congested today.  Recommend use of Afrin 2 sprays each nostril and wait 5-10 minutes until you can breathe more freely then use your Flonase.  Only use Afrin for 3-5 days in a row max  - Continue Flonase 1-2 sprays each nostril daily as needed for congestion.  Use for 1-2 weeks at a time before stopping once symptoms improved.   - Continue Astelin 1-2 sprays 1-2 times a day as needed for nasal drainage/post-nasal drip   Follow-up in 6 months or sooner if needed

## 2021-07-24 NOTE — Progress Notes (Signed)
Follow-up Note  RE: [name] MRN: [id number] DOB: [birth year] Date of Office Visit: 07/24/2021   History of present illness: [name] is a 71 y.o. female presenting today for follow-up of asthma and mixed rhinitis.  She was last seen in the office on 01/15/2021 by myself.  She had covid in September that she states was mild with symptoms of cough, runny nose, sore throat.  She did do paxlovid course.  Symptoms did resolve.  Didn't need to use albuterol during this time.    She states her asthma has been doing well.  She is on Flovent 110 mcg taking 1 puff once a day and singular daily.  She has not required albuterol use.  She has not required any systemic steroids for ED or urgent care visits.  She states her nasal/allergy symptoms are doing okay.  She is taking Xyzal all and will use Flonase as needed.  She is currently doing Astelin on a consistent basis. .  Review of systems: Review of Systems  Constitutional: Negative.   HENT: Negative.    Eyes: Negative.   Respiratory: Negative.    Cardiovascular: Negative.   Gastrointestinal: Negative.   Musculoskeletal: Negative.   Skin: Negative.   Allergic/Immunologic: Negative.   Neurological: Negative.     All other systems negative unless noted above in HPI  Past medical/social/surgical/family history have been reviewed and are unchanged unless specifically indicated below.  No changes  Medication List: Current Outpatient Medications  Medication Sig Dispense Refill   Acetaminophen (TYLENOL PO) Take by mouth. Takes 500mg  qam and 2 tablets qhs     albuterol (VENTOLIN HFA) 108 (90 Base) MCG/ACT inhaler      amLODipine (NORVASC) 5 MG tablet Take 5 mg by mouth daily.     azelastine (ASTELIN) 0.1 % nasal spray Place 2 sprays into both nostrils 2 (two) times daily. Use in each nostril as directed 60 mL 5   baclofen (LIORESAL) 20 MG tablet Take 20 mg by mouth 3 (three) times daily as needed.     CINNAMON PO 2 capsules  Orally once a day     estradiol (ESTRACE) 0.1 MG/GM vaginal cream Place 1/2 gram per vagina at bed time twice weekly. 42.5 g 1   famotidine-calcium carbonate-magnesium hydroxide (PEPCID COMPLETE) 10-800-165 MG chewable tablet 1 tablet Orally Twice a day     fluticasone (FLOVENT HFA) 44 MCG/ACT inhaler Inhale 1 puff into the lungs daily. 1 each 5   gabapentin (NEURONTIN) 400 MG capsule Take 1 capsule by mouth 4 (four) times daily.     GLUCOSAMINE-CHONDROITIN PO Take 1 tablet by mouth daily. Move Free Ultra Triple Action     glucose blood test strip as directed finger stick once a day for 90 days     hydrochlorothiazide (MICROZIDE) 12.5 MG capsule 1 1/2     levothyroxine (SYNTHROID) 112 MCG tablet 1 tablet on an empty stomach in the morning     losartan (COZAAR) 100 MG tablet Take 100 mg by mouth daily.     Melatonin 3 MG TABS Take 3 mg by mouth as needed.     metFORMIN (GLUCOPHAGE-XR) 500 MG 24 hr tablet 1 tablet with evening meal Orally Once a day for 90 days     Multiple Vitamin (MULTIVITAMIN WITH MINERALS) TABS tablet Take 1 tablet by mouth at bedtime.     Omega-3 Fatty Acids (FISH OIL) 1200 MG CAPS Take 1,200 mg by mouth daily.     oxymetazoline (AFRIN NASAL SPRAY)  0.05 % nasal spray Place 2 sprays into both nostrils daily. 30 mL 5   Semaglutide, 1 MG/DOSE, (OZEMPIC, 1 MG/DOSE,) 4 MG/3ML SOPN 1 mg     sertraline (ZOLOFT) 50 MG tablet Take 50 mg by mouth daily.     simvastatin (ZOCOR) 20 MG tablet Take 20 mg by mouth at bedtime.      Trospium Chloride 60 MG CP24 Take 1 capsule by mouth every morning.     Turmeric 500 MG CAPS 2 capsules Orally once a day     zolpidem (AMBIEN) 10 MG tablet Take 5 mg by mouth at bedtime as needed for sleep.      hydrocortisone-pramoxine (PROCTOFOAM-HC) rectal foam 1 application as needed Rectal Four times a day if needed for hemorrhoid for 7 days (Patient not taking: Reported on 07/24/2021)     levocetirizine (XYZAL) 5 MG tablet TAKE 1 TABLET BY MOUTH EVERY  EVENING 90 tablet 1   montelukast (SINGULAIR) 10 MG tablet Take 1 tablet (10 mg total) by mouth at bedtime. 30 tablet 5   No current facility-administered medications for this visit.     Known medication allergies: Allergies  Allergen Reactions   Oxycodone-Acetaminophen Nausea Only     Physical examination: Blood pressure 120/68, pulse 83, temperature 97.8 F (36.6 C), temperature source Temporal, resp. rate 20, height 5\' 5"  (1.651 m), weight 229 lb 9.6 oz (104.1 kg), last menstrual period 07/06/1998, SpO2 96 %.  General: Alert, interactive, in no acute distress. HEENT: PERRLA, TMs pearly gray, turbinates markedly edematous without discharge, post-pharynx non erythematous. Neck: Supple without lymphadenopathy. Lungs: Clear to auscultation without wheezing, rhonchi or rales. {no increased work of breathing. CV: Normal S1, S2 without murmurs. Abdomen: Nondistended, nontender. Skin: Warm and dry, without lesions or rashes. Extremities:  No clubbing, cyanosis or edema. Neuro:   Grossly intact.  Diagnositics/Labs:  Spirometry: FEV1: 2.04 L or 88%, FVC: 2.35 L 79%, ratio consistent with nonobstructive pattern  Assessment and plan:   Asthma  - doing well at this time  - will try to wean down to Flovent 44 g  1 puff once a day.   If you note increase in asthma symptoms or not meeting below goals go back to Flovent 110 dose  - asthma action plan if having asthma flare: take Flovent 2 puffs twice a day   - Continue Singulair 10 mg daily  - have access to albuterol inhaler 2 puffs every 4-6 hours as needed for cough/wheeze/shortness of breath/chest tightness.  May use 15-20 minutes prior to activity.   Monitor frequency of use.    Asthma control goals:  Full participation in all desired activities (may need albuterol before activity) Albuterol use two time or less a week on average (not counting use with activity) Cough interfering with sleep two time or less a month Oral steroids  no more than once a year No hospitalizations    Mixed rhinitis  - Continue Singulair as above  - continue Xyzal 5mg  in evening to help minimize sneezing episodes.  May try to take every other day.   - nose looks very congested today.  Recommend use of Afrin 2 sprays each nostril and wait 5-10 minutes until you can breathe more freely then use your Flonase.  Only use Afrin for 3-5 days in a row max  - Continue Flonase 1-2 sprays each nostril daily as needed for congestion.  Use for 1-2 weeks at a time before stopping once symptoms improved.   - Continue Astelin 1-2 sprays  1-2 times a day as needed for nasal drainage/post-nasal drip   Follow-up in 6 months or sooner if needed    I appreciate the opportunity to take part in [name]'s care. Please do not hesitate to contact me with questions.  Sincerely,   Prudy Feeler, MD Allergy/Immunology [facility]

## 2021-07-29 NOTE — Telephone Encounter (Signed)
Patient scheduled on 07/30/21 @ 10:30am with Dr.MacDiarmd.

## 2021-07-30 DIAGNOSIS — R350 Frequency of micturition: Secondary | ICD-10-CM | POA: Diagnosis not present

## 2021-07-30 DIAGNOSIS — N3946 Mixed incontinence: Secondary | ICD-10-CM | POA: Diagnosis not present

## 2021-07-30 DIAGNOSIS — R351 Nocturia: Secondary | ICD-10-CM | POA: Diagnosis not present

## 2021-07-30 NOTE — Progress Notes (Signed)
GYNECOLOGY  VISIT   HPI: 71 y.o.   Married  Caucasian  female   [phone] with Patient's last menstrual period was 07/06/1998 (approximate).   here for vaginal biopsy.   A vaginal polypoid lesion was seen with her routine exam on 07/23/21.  Taking Mybetriq for about a week for overactive bladder through Dr. Matilde Sprang.  Had cystoscopy and bladder US, which were normal per patient.   GYNECOLOGIC HISTORY: Patient's last menstrual period was 07/06/1998 (approximate). Contraception:  PMP Menopausal hormone therapy:  Estrogen cream Last mammogram:  11-25-20 bil.benign calcifications/Neg/BiRads2 Last pap smear:   07-16-20 Neg, 07-04-18 Neg, 06-01-16 Neg:Neg HR HPV        OB History     Gravida  3   Para      Term      Preterm      AB  1   Living  2      SAB  1   IAB      Ectopic      Multiple      Live Births                 Patient Active Problem List   Diagnosis Date Noted   Mild persistent asthma without complication 12/27/7626   Mixed rhinitis 06/10/2018   Obstructive sleep apnea 12/11/2016   Palpitations 06/17/2016   Deflected nasal septum 09/13/2015   Hypertrophy of nasal turbinates 09/13/2015   Deviated nasal septum 06/21/2015    Class: Chronic   HYPERLIPIDEMIA 11/18/2007   Essential hypertension 11/18/2007   Allergic rhinitis 11/18/2007   COUGH 11/18/2007    Past Medical History:  Diagnosis Date   Asthma    Chronic back pain    Diabetes mellitus without complication ([place])    no med   GERD (gastroesophageal reflux disease)    History of COVID-19 04/05/2021   Hyperlipemia    Hypertension    Hypothyroidism    Insomnia    OAB (overactive bladder)    Seasonal allergies    Sleep apnea    uses a cpap    Past Surgical History:  Procedure Laterality Date   ANKLE ARTHROTOMY  2003   left-fx   APPENDECTOMY     back fusion  02/14/2019   L4 back fusion   BACK SURGERY  2010   lumb disk   BREAST LUMPECTOMY WITH RADIOACTIVE SEED LOCALIZATION  Left 06/13/2019   Procedure: LEFT BREAST LUMPECTOMY WITH RADIOACTIVE SEED LOCALIZATION;  Surgeon: Erroll Luna, MD;  Location: [facility];  Service: General;  Laterality: Left;   CARPAL TUNNEL RELEASE     rt   CARPAL TUNNEL RELEASE Left 08/01/2013   Procedure: LEFT CARPAL TUNNEL RELEASE;  Surgeon: Wynonia Sours, MD;  Location: [facility];  Service: Orthopedics;  Laterality: Left;   CESAREAN SECTION  83,86   COLONOSCOPY     DILATATION & CURETTAGE/HYSTEROSCOPY WITH MYOSURE N/A 08/23/2018   Procedure: DILATATION & CURETTAGE/HYSTEROSCOPY WITH MYOSURE POLYPECTOMY;  Surgeon: Nunzio Cobbs, MD;  Location[facility];  Service: Gynecology;  Laterality: N/A;  fibroid resection   DILATION AND CURETTAGE OF UTERUS     x2   MASS EXCISION N/A 08/04/2017   Procedure: EXCISION LIPOMA UPPER BACK x2 AND NECK;  Surgeon: Erroll Luna, MD;  Location: [place];  Service: General;  Laterality: N/A;   NASAL SEPTOPLASTY W/ TURBINOPLASTY Bilateral 06/21/2015   Procedure: NASAL SEPTOPLASTY WITH BILATERAL TURBINATE REDUCTION;  Surgeon: Jerrell Belfast, MD;  Location:  [facility];  Service: ENT;  Laterality: Bilateral;   SINOSCOPY     TONSILLECTOMY     TUBAL LIGATION      Current Outpatient Medications  Medication Sig Dispense Refill   Acetaminophen (TYLENOL PO) Take by mouth. Takes 557m qam and 2 tablets qhs     albuterol (VENTOLIN HFA) 108 (90 Base) MCG/ACT inhaler      amLODipine (NORVASC) 5 MG tablet Take 5 mg by mouth daily.     azelastine (ASTELIN) 0.1 % nasal spray Place 2 sprays into both nostrils 2 (two) times daily. Use in each nostril as directed 60 mL 5   baclofen (LIORESAL) 20 MG tablet Take 20 mg by mouth 3 (three) times daily as needed.     CINNAMON PO 2 capsules Orally once a day     estradiol (ESTRACE) 0.1 MG/GM vaginal cream Place 1/2 gram per vagina at bed time twice weekly. 42.5 g 1   famotidine-calcium carbonate-magnesium  hydroxide (PEPCID COMPLETE) 10-800-165 MG chewable tablet 1 tablet Orally Twice a day     fluticasone (FLOVENT HFA) 44 MCG/ACT inhaler Inhale 1 puff into the lungs daily. 1 each 5   gabapentin (NEURONTIN) 400 MG capsule Take 1 capsule by mouth 4 (four) times daily.     GLUCOSAMINE-CHONDROITIN PO Take 1 tablet by mouth daily. Move Free Ultra Triple Action     glucose blood test strip as directed finger stick once a day for 90 days     hydrochlorothiazide (MICROZIDE) 12.5 MG capsule 1 1/2     hydrocortisone-pramoxine (PROCTOFOAM-HC) rectal foam      levocetirizine (XYZAL) 5 MG tablet TAKE 1 TABLET BY MOUTH EVERY EVENING 90 tablet 1   levothyroxine (SYNTHROID) 112 MCG tablet 1 tablet on an empty stomach in the morning     losartan (COZAAR) 100 MG tablet Take 100 mg by mouth daily.     Melatonin 3 MG TABS Take 3 mg by mouth as needed.     metFORMIN (GLUCOPHAGE-XR) 500 MG 24 hr tablet 1 tablet with evening meal Orally Once a day for 90 days     mirabegron ER (MYRBETRIQ) 25 MG TB24 tablet Take 25 mg by mouth daily.     montelukast (SINGULAIR) 10 MG tablet Take 1 tablet (10 mg total) by mouth at bedtime. 30 tablet 5   Multiple Vitamin (MULTIVITAMIN WITH MINERALS) TABS tablet Take 1 tablet by mouth at bedtime.     Omega-3 Fatty Acids (FISH OIL) 1200 MG CAPS Take 1,200 mg by mouth daily.     Semaglutide, 1 MG/DOSE, (OZEMPIC, 1 MG/DOSE,) 4 MG/3ML SOPN 1 mg     sertraline (ZOLOFT) 50 MG tablet Take 50 mg by mouth daily.     simvastatin (ZOCOR) 20 MG tablet Take 20 mg by mouth at bedtime.      Turmeric 500 MG CAPS 2 capsules Orally once a day     zolpidem (AMBIEN) 10 MG tablet Take 5 mg by mouth at bedtime as needed for sleep.      No current facility-administered medications for this visit.     ALLERGIES: Oxycodone-acetaminophen  Family History  Problem Relation Age of Onset   Allergic rhinitis Father    Allergic rhinitis Sister    Asthma Sister    Uterine cancer Sister [phone]  Asthma Paternal  Aunt    Multiple myeloma Sister    Angioedema Neg Hx    Eczema Neg Hx    Immunodeficiency Neg Hx    Urticaria Neg Hx  Social History   Socioeconomic History   Marital status: Married    Spouse name: Not on file   Number of children: Not on file   Years of education: Not on file   Highest education level: Not on file  Occupational History   Not on file  Tobacco Use   Smoking status: Never   Smokeless tobacco: Never  Vaping Use   Vaping Use: Never used  Substance and Sexual Activity   Alcohol use: Yes    Comment: 2 glasses of wine/month   Drug use: No   Sexual activity: Yes    Birth control/protection: Post-menopausal  Other Topics Concern   Not on file  Social History Narrative   Not on file   Social Determinants of Health   Financial Resource Strain: Not on file  Food Insecurity: Not on file  Transportation Needs: Not on file  Physical Activity: Not on file  Stress: Not on file  Social Connections: Not on file  Intimate Partner Violence: Not on file    Review of Systems  All other systems reviewed and are negative.  PHYSICAL EXAMINATION:    BP (!) 144/84    Ht 5' 5.5" (1.664 m)    Wt 228 lb (103.4 kg)    LMP 07/06/1998 (Approximate)    BMI 37.36 kg/m     General appearance: alert, cooperative and appears stated age   Pelvic: External genitalia:  no lesions              Urethra:  normal appearing urethra with no masses, tenderness or lesions              Bartholins and Skenes: normal                 Vagina: normal appearing vagina with normal color and discharge, 4 mm left vaginal wall polypoid lesion              Cervix: no lesions                Procedure - vaginal biopsy Consent for procedure.  Sterile prep with betadine.  Tischler used to biopsy and essentially remove the lesion.  Silver nitrate applied to biopsy site. Minimal EBL.  No complications.  Tissue to pathology.   Chaperone was present for exam:  Estill Bamberg, CMA  ASSESSMENT  Vaginal  lesion.  Overactive bladder.  Improved on Myrbetriq.   PLAN  FU biopsy result.  Call for heavy bleeding, pain or fever.  FU prn.   An After Visit Summary was printed and given to the patient.

## 2021-08-04 DIAGNOSIS — G4733 Obstructive sleep apnea (adult) (pediatric): Secondary | ICD-10-CM | POA: Diagnosis not present

## 2021-08-05 ENCOUNTER — Other Ambulatory Visit: Payer: Self-pay

## 2021-08-05 ENCOUNTER — Ambulatory Visit: Payer: PPO | Admitting: Obstetrics and Gynecology

## 2021-08-05 ENCOUNTER — Other Ambulatory Visit (HOSPITAL_COMMUNITY)
Admission: RE | Admit: 2021-08-05 | Discharge: 2021-08-05 | Disposition: A | Discharge status: Home / Self Care | Payer: PPO | Source: Ambulatory Visit | Attending: Obstetrics and Gynecology | Admitting: Obstetrics and Gynecology

## 2021-08-05 VITALS — BP 144/84 | Ht 65.5 in | Wt 228.0 lb

## 2021-08-05 DIAGNOSIS — N898 Other specified noninflammatory disorders of vagina: Secondary | ICD-10-CM | POA: Insufficient documentation

## 2021-08-05 DIAGNOSIS — R87629 Unspecified abnormal cytological findings in specimens from vagina: Secondary | ICD-10-CM | POA: Diagnosis not present

## 2021-08-05 NOTE — Patient Instructions (Signed)
Please call for heavy vaginal bleeding, pain, or fever.   The office will contact you after I have reviewed your biopsy result.

## 2021-08-07 LAB — SURGICAL PATHOLOGY

## 2021-08-08 ENCOUNTER — Other Ambulatory Visit: Payer: Self-pay | Admitting: *Deleted

## 2021-08-08 DIAGNOSIS — N893 Dysplasia of vagina, unspecified: Secondary | ICD-10-CM

## 2021-08-11 NOTE — Progress Notes (Signed)
°  Subjective:     Patient ID: [name], female   DOB: October 26, 1950, 71 y.o.   MRN: [id number]  HPI Patient here today for colposcopy with pap and HPV testing. 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  Pap 07/16/20 normal   Review of Systems  All other systems reviewed and are negative. LMP: PMP     Objective:   Physical Exam  Colposcopy of cervix, vagina and vulva.  Consent done.  Pap and HR HPV collected. 3% acetic acid placed in vagina.  Colposcopy done with white light and green light filter.  No lesions of the cervix or vagina.  Left vaginal wall biopsy site healing and bleeds slightly with contact with speculum.  No remaining lesion.     3% acetic acid to the vulva.  Flat hypopigmentation of left labia minora.  Scar? Betadine prep to left labia minora.  Lidocaine local 1% - lot [id number], exp 10/2022.  3 mm punch biopsy done.  Tissue to pathology. Minimal EBL.  No complications.   Assessment:     VAIN I. Vulvar lesion.    Plan:     FU pap and HR HPV.  FU left labia minora biopsy. Final plan to follow.

## 2021-08-12 ENCOUNTER — Ambulatory Visit (INDEPENDENT_AMBULATORY_CARE_PROVIDER_SITE_OTHER): Payer: PPO | Admitting: Obstetrics and Gynecology

## 2021-08-12 ENCOUNTER — Other Ambulatory Visit: Payer: Self-pay

## 2021-08-12 ENCOUNTER — Other Ambulatory Visit (HOSPITAL_COMMUNITY)
Admission: RE | Admit: 2021-08-12 | Discharge: 2021-08-12 | Disposition: A | Discharge status: Home / Self Care | Payer: PPO | Source: Ambulatory Visit | Attending: Obstetrics and Gynecology | Admitting: Obstetrics and Gynecology

## 2021-08-12 ENCOUNTER — Encounter: Payer: Self-pay | Admitting: Obstetrics and Gynecology

## 2021-08-12 VITALS — BP 140/68 | HR 77 | Ht 65.5 in | Wt 228.0 lb

## 2021-08-12 DIAGNOSIS — N898 Other specified noninflammatory disorders of vagina: Secondary | ICD-10-CM

## 2021-08-12 DIAGNOSIS — D038 Melanoma in situ of other sites: Secondary | ICD-10-CM | POA: Diagnosis not present

## 2021-08-12 DIAGNOSIS — N893 Dysplasia of vagina, unspecified: Secondary | ICD-10-CM

## 2021-08-12 DIAGNOSIS — C519 Malignant neoplasm of vulva, unspecified: Secondary | ICD-10-CM | POA: Diagnosis not present

## 2021-08-12 DIAGNOSIS — N9089 Other specified noninflammatory disorders of vulva and perineum: Secondary | ICD-10-CM | POA: Insufficient documentation

## 2021-08-12 DIAGNOSIS — N890 Mild vaginal dysplasia: Secondary | ICD-10-CM

## 2021-08-12 DIAGNOSIS — Z1151 Encounter for screening for human papillomavirus (HPV): Secondary | ICD-10-CM | POA: Diagnosis not present

## 2021-08-12 NOTE — Patient Instructions (Signed)
Colposcopy, Care After ?The following information offers guidance on how to care for yourself after your procedure. Your health care provider may also give you more specific instructions. If you have problems or questions, contact your health care provider. ?What can I expect after the procedure? ?If you had a colposcopy without a biopsy, you can expect to feel fine right away after your procedure. However, you may have some spotting of blood for a few days. You can return to your normal activities. ?If you had a colposcopy with a biopsy, it is common after the procedure to have: ?Soreness and mild pain. These may last for a few days. ?Mild vaginal bleeding or discharge that is dark-colored and grainy. This may last for a few days. The discharge may be caused by a liquid (solution) that was used during the procedure. You may need to wear a sanitary pad during this time. ?Spotting of blood for at least 48 hours after the procedure. ?Follow these instructions at home: ?Medicines ?Take over-the-counter and prescription medicines only as told by your health care provider. ?Talk with your health care provider about what type of over-the-counter pain medicines and prescription medicines you can start to take again. It is especially important to talk with your health care provider if you take blood thinners. ?Activity ?Avoid using douche products, using tampons, and having sex for at least 3 days after the procedure or for as long as told by your health care provider. ?Return to your normal activities as told by your health care provider. Ask your health care provider what activities are safe for you. ?General instructions ?Ask your health care provider if you may take baths, swim, or use a hot tub. You may take showers. ?If you use birth control (contraception), continue to use it. ?Keep all follow-up visits. This is important. ?Contact a health care provider if: ?You have a fever or chills. ?You faint or feel  light-headed. ?Get help right away if: ?You have heavy bleeding from your vagina or pass blood clots. Heavy bleeding is bleeding that soaks through a sanitary pad in less than 1 hour. ?You have vaginal discharge that is abnormal, is yellow in color, or smells bad. This could be a sign of infection. ?You have severe pain or cramps in your lower abdomen that do not go away with medicine. ?Summary ?If you had a colposcopy without a biopsy, you can expect to feel fine right away, but you may have some spotting of blood for a few days. You can return to your normal activities. ?If you had a colposcopy with a biopsy, it is common to have mild pain for a few days and spotting for 48 hours after the procedure. ?Avoid using douche products, using tampons, and having sex for at least 3 days after the procedure or for as long as told by your health care provider. ?Get help right away if you have heavy bleeding, severe pain, or signs of infection. ?This information is not intended to replace advice given to you by your health care provider. Make sure you discuss any questions you have with your health care provider. ?Document Revised: 11/17/2020 Document Reviewed: 11/17/2020 ?Elsevier Patient Education ? 2022 [employer] ? ?

## 2021-08-14 LAB — CYTOLOGY - PAP
Comment: NEGATIVE
Diagnosis: NEGATIVE
Diagnosis: REACTIVE
High risk HPV: NEGATIVE

## 2021-08-18 ENCOUNTER — Telehealth: Payer: Self-pay

## 2021-08-18 DIAGNOSIS — C519 Malignant neoplasm of vulva, unspecified: Secondary | ICD-10-CM

## 2021-08-18 LAB — SURGICAL PATHOLOGY

## 2021-08-18 NOTE — Telephone Encounter (Signed)
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

## 2021-08-18 NOTE — Telephone Encounter (Signed)
Referral placed.

## 2021-08-19 ENCOUNTER — Telehealth: Payer: Self-pay | Admitting: *Deleted

## 2021-08-19 NOTE — Telephone Encounter (Signed)
Spoke with the patient regarding her referral and scheduled a new patient appt with Dr Berline Lopes on 2/24 at 10:30 am. Patient given an arrival time of 10 am. Patient given the address and phone number for the clinic; along with the policy for mask and visitors

## 2021-08-19 NOTE — Telephone Encounter (Signed)
Appointment has been scheduled with Dr. Berline Lopes for 08/29/21.

## 2021-08-27 ENCOUNTER — Encounter: Payer: Self-pay | Admitting: Gynecologic Oncology

## 2021-08-28 NOTE — Progress Notes (Signed)
GYNECOLOGIC ONCOLOGY NEW PATIENT CONSULTATION   Patient Name: [name]  Patient Age: 71 y.o. 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  I discussed with the patient and her husband biopsy results from her recent visit which show vulvar melanoma. Findings on exam today are subtle (I'm unable to see evidence of biopsy site but do see a mildly hypopigmented area along the lateral aspect of the left upper labia described as site of biopsy). Peripheral margins of the biopsy as well as the deep margin (focally) are involved by melanoma.   We discussed next steps including PET scan to assess for metastatic disease and dermatology evaluation. The patient has a dermatologist and will call to see if she can get her annual exam moved up.  From a treatment standpoint, I recommend that we proceed with excision of her vulvar lesion and plan for sentinel lymph node biopsy. This is a procedure that I am unable to perform at [facility]. I will refer her to one of my partners at [facility] who performs vulvar sentinel lymph nodes. Given depth of invasion of 1.4 mm (although focally positive deep margin), the patient will need a vulvar excision with a goal of achieving a 1-2 cm negative margin.   The patient has not been seen within the [facility] system. We will work to get her a medical record number and then send a referral.   We discussed that based on the final pathology, she may benefit from adjuvant therapy.   A copy of this note was sent to the patient's referring provider.   60 minutes of total time was spent for this patient encounter, including preparation, face-to-face counseling with the patient and coordination of care, and documentation of the encounter.   Jeral Pinch, MD  Division of Gynecologic  Oncology  Department of Obstetrics and Gynecology  [facility]  ___________________________________________  Chief Complaint: Chief Complaint  Patient presents with   Malignant melanoma, unspecified site [facility])    History of Present Illness:  [name] is a 71 y.o. y.o. female who is seen in consultation at the request of Dr. Quincy Simmonds for an evaluation of vulvar melanoma.  The patient had a vaginal biopsy of a polypoid lesion (seen on routine exam) that showed koilocytic atypia consistent with VAIN1. She was seen on 2/7 for pap and HPV testing given biopsy results. Vulvoscopy was also performed with a flat hypopigmented area on the left labia noted - 67m punch biopsy was performed.  Biopsy revealed vulvar melanoma.  Patient reports doing well after the biopsy.  She denies any significant symptoms prior to including vaginal bleeding, discharge, pruritus, or vulvar pain.  She denies ever feeling any abnormality along her labia.  She endorses a good appetite without nausea or emesis.  She reports regular bowel function.  She has a history of overactive bladder and saw urologist here recently.  She underwent cystoscopy and is on a new medication for her symptoms.  She sees a dermatologist yearly, last visit was in May or June.  Patient presents with her husband today.  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  PAST MEDICAL HISTORY:  Past Medical History:  Diagnosis Date   Asthma    has rescue inhaler, now  on Flovent   Chronic back pain    Diabetes mellitus without complication ([place])    on medformin, on Ozempic   GERD (gastroesophageal reflux disease)    History of COVID-19 04/05/2021   Hyperlipemia    Hypertension    Hypothyroidism    Insomnia    OAB (overactive bladder)    Seasonal allergies    Sleep apnea    uses a cpap     PAST SURGICAL HISTORY:  Past Surgical  History:  Procedure Laterality Date   ANKLE ARTHROTOMY  2003   left-fx   APPENDECTOMY     back fusion  02/14/2019   L4 back fusion   BACK SURGERY  2010   lumb disk   BREAST LUMPECTOMY WITH RADIOACTIVE SEED LOCALIZATION Left 06/13/2019   Procedure: LEFT BREAST LUMPECTOMY WITH RADIOACTIVE SEED LOCALIZATION;  Surgeon: Erroll Luna, MD;  Location: [place];  Service: General;  Laterality: Left;   CARPAL TUNNEL RELEASE     rt   CARPAL TUNNEL RELEASE Left 08/01/2013   Procedure: LEFT CARPAL TUNNEL RELEASE;  Surgeon: Wynonia Sours, MD;  Location: [place];  Service: Orthopedics;  Laterality: Left;   CESAREAN SECTION  83,86   COLONOSCOPY     DILATATION & CURETTAGE/HYSTEROSCOPY WITH MYOSURE N/A 08/23/2018   Procedure: DILATATION & CURETTAGE/HYSTEROSCOPY WITH MYOSURE POLYPECTOMY;  Surgeon: Nunzio Cobbs, MD;  Location: [facility];  Service: Gynecology;  Laterality: N/A;  fibroid resection   DILATION AND CURETTAGE OF UTERUS     x2   MASS EXCISION N/A 08/04/2017   Procedure: EXCISION LIPOMA UPPER BACK x2 AND NECK;  Surgeon: Erroll Luna, MD;  Location: [place];  Service: General;  Laterality: N/A;   NASAL SEPTOPLASTY W/ TURBINOPLASTY Bilateral 06/21/2015   Procedure: NASAL SEPTOPLASTY WITH BILATERAL TURBINATE REDUCTION;  Surgeon: Jerrell Belfast, MD;  Location: [place];  Service: ENT;  Laterality: Bilateral;   SINOSCOPY     TONSILLECTOMY     TUBAL LIGATION      OB/GYN HISTORY:  OB History  Gravida Para Term Preterm AB Living  '3       1 2  ' SAB IAB Ectopic Multiple Live Births  1            # Outcome Date GA Lbr Len/2nd Weight Sex Delivery Anes PTL Lv  3 Gravida 08/19/84    M CS-LTranv     2 Gravida 06/25/82    F CS-LTranv     1 SAB             Patient's last menstrual period was 07/06/1998 (approximate).  Age at menarche: 56 Age at menopause: 34 Hx of HRT: Uses vaginal estrogen cream, denies other HRT Hx  of STDs: Denies Last pap: 08/12/21 - negative, HPV negative History of abnormal pap smears: See HPI, no history of abnormal Pap smears but had recent vaginal polypoid lesion removed that was consistent with low-grade dysplasia  SCREENING STUDIES:  Last mammogram: 11/2020  Last colonoscopy: 2021  MEDICATIONS: Outpatient Encounter Medications as of 08/29/2021  Medication Sig   amLODipine (NORVASC) 5 MG tablet Take 5 mg by mouth daily.   baclofen (LIORESAL) 20 MG tablet Take 20 mg by mouth 3 (three) times daily as needed.   CINNAMON PO 2 capsules Orally once a day   estradiol (ESTRACE) 0.1 MG/GM vaginal cream Place 1/2 gram per vagina at bed time twice weekly.   famotidine-calcium carbonate-magnesium hydroxide (PEPCID COMPLETE) 10-800-165 MG chewable tablet 1  tablet Orally Twice a day   fluticasone (FLONASE) 50 MCG/ACT nasal spray Place into both nostrils daily.   fluticasone (FLOVENT HFA) 44 MCG/ACT inhaler Inhale 1 puff into the lungs daily.   gabapentin (NEURONTIN) 400 MG capsule Take 1 capsule by mouth 4 (four) times daily.   GLUCOSAMINE-CHONDROITIN PO Take 1 tablet by mouth daily. Move Free Ultra Triple Action   glucose blood test strip as directed finger stick once a day for 90 days   hydrochlorothiazide (MICROZIDE) 12.5 MG capsule 1 1/2   levocetirizine (XYZAL) 5 MG tablet TAKE 1 TABLET BY MOUTH EVERY EVENING   levothyroxine (SYNTHROID) 112 MCG tablet 1 tablet on an empty stomach in the morning   losartan (COZAAR) 100 MG tablet Take 100 mg by mouth daily.   Melatonin 3 MG TABS Take 3 mg by mouth as needed.   metFORMIN (GLUCOPHAGE-XR) 500 MG 24 hr tablet 1 tablet with evening meal Orally Once a day for 90 days   mirabegron ER (MYRBETRIQ) 25 MG TB24 tablet Take 25 mg by mouth daily.   montelukast (SINGULAIR) 10 MG tablet Take 1 tablet (10 mg total) by mouth at bedtime.   Multiple Vitamin (MULTIVITAMIN WITH MINERALS) TABS tablet Take 1 tablet by mouth at bedtime.   Omega-3 Fatty Acids  (FISH OIL) 1200 MG CAPS Take 1,200 mg by mouth daily.   Semaglutide, 1 MG/DOSE, (OZEMPIC, 1 MG/DOSE,) 4 MG/3ML SOPN 1 mg   sertraline (ZOLOFT) 50 MG tablet Take 50 mg by mouth daily.   simvastatin (ZOCOR) 20 MG tablet Take 20 mg by mouth at bedtime.    Turmeric 500 MG CAPS 2 capsules Orally once a day   zolpidem (AMBIEN) 10 MG tablet Take 5 mg by mouth at bedtime as needed for sleep.    Acetaminophen (TYLENOL PO) Take by mouth. Takes 555m qam and 2 tablets qhs   albuterol (VENTOLIN HFA) 108 (90 Base) MCG/ACT inhaler  (Patient not taking: Reported on 08/27/2021)   azelastine (ASTELIN) 0.1 % nasal spray Place 2 sprays into both nostrils 2 (two) times daily. Use in each nostril as directed (Patient not taking: Reported on 08/27/2021)   hydrocortisone-pramoxine ([facility] rectal foam  (Patient not taking: Reported on 08/27/2021)   No facility-administered encounter medications on file as of 08/29/2021.    ALLERGIES:  Allergies  Allergen Reactions   Oxycodone-Acetaminophen Nausea Only     FAMILY HISTORY:  Family History  Problem Relation Age of Onset   Allergic rhinitis Father    Allergic rhinitis Sister    Asthma Sister    Endometrial cancer Sister [phone]  Multiple myeloma Sister    Cancer Maternal Grandmother    Asthma Paternal Aunt    Angioedema Neg Hx    Eczema Neg Hx    Immunodeficiency Neg Hx    Urticaria Neg Hx    Ovarian cancer Neg Hx    Breast cancer Neg Hx    Colon cancer Neg Hx    Prostate cancer Neg Hx    Pancreatic cancer Neg Hx      SOCIAL HISTORY:  Social Connections: Not on file    REVIEW OF SYSTEMS:  Denies appetite changes, fevers, chills, fatigue, unexplained weight changes. Denies hearing loss, neck lumps or masses, mouth sores, ringing in ears or voice changes. Denies cough or wheezing.  Denies shortness of breath. Denies chest pain or palpitations. Denies leg swelling. Denies abdominal distention, pain, blood in stools, constipation, diarrhea, nausea,  vomiting, or early satiety. Denies pain with intercourse, dysuria, frequency, hematuria  or incontinence. Denies hot flashes, pelvic pain, vaginal bleeding or vaginal discharge.   Denies joint pain, back pain or muscle pain/cramps. Denies itching, rash, or wounds. Denies dizziness, headaches, numbness or seizures. Denies swollen lymph nodes or glands, denies easy bruising or bleeding. Denies anxiety, depression, confusion, or decreased concentration.  Physical Exam:  Vital Signs for this encounter:  Blood pressure (!) 157/74, pulse 71, temperature 98.7 F (37.1 C), temperature source Oral, resp. rate 16, height 5' 5.95" (1.675 m), weight 230 lb 3.2 oz (104.4 kg), last menstrual period 07/06/1998, SpO2 97 %. Body mass index is 37.22 kg/m. General: Alert, oriented, no acute distress.  HEENT: Normocephalic, atraumatic. Sclera anicteric.  Chest: Clear to auscultation bilaterally. No wheezes, rhonchi, or rales. Cardiovascular: Regular rate and rhythm, no murmurs, rubs, or gallops.  Abdomen: Obese. Normoactive bowel sounds. Soft, nondistended, nontender to palpation. No masses or hepatosplenomegaly appreciated. No palpable fluid wave.  Extremities: Grossly normal range of motion. Warm, well perfused. No edema bilaterally.  Skin: No rashes.  Patient has a number of hyperpigmented lesions on bilateral forearms as well as her up her chest and neck.  Lymphatics: No cervical, supraclavicular, or inguinal adenopathy.  GU: External genitalia notable for 2 small, less than 1 cm areas of brown hyperpigmentation along the inner left labia at approximately 1 and 2:00.  Along the outer edge of the labia minora between 1 and 2:00, there is an approximately 1 cm area of slightly hypopigmented tissue.  No evidence of prior biopsy is seen today.  No other areas of hypopigmentation noted.  No nodules or masses noted.  On speculum exam, cervix is normal-appearing without masses, no vaginal lesions noted.  LABORATORY  AND RADIOLOGIC DATA:  Outside medical records were reviewed to synthesize the above history, along with the history and physical obtained during the visit.   Lab Results  Component Value Date   WBC 5.8 06/07/2019   HGB 13.0 06/07/2019   HCT 40.0 06/07/2019   PLT 249 06/07/2019   GLUCOSE 165 (H) 06/07/2019   ALT 32 06/07/2019   AST 26 06/07/2019   NA 138 06/07/2019   K 3.9 06/07/2019   CL 105 06/07/2019   CREATININE 0.67 06/07/2019   BUN 12 06/07/2019   CO2 23 06/07/2019   INR 0.9 02/28/2009   HGBA1C 6.7 (H) 06/14/2015   Vulvar biopsy 2/7: LABIA MINORA, LEFT, BIOPSY: MALIGNANT MELANOMA, SEE TABLE  PROCEDURE: BIOPSY  SPECIMEN ANATOMIC SITE: LABIA MINORA, LEFT  HISTOLOGIC TYPE: NODULAR  BRESLOW'S DEPTH/TUMOR THICKNESS: 1.4 MM  CLARK/ANATOMIC LEVEL: IV  MARGINS: PERIPHERAL: INVOLVED WITH BOTH IN-SITU AND INVASIVE MELANOMA  DEEP: MELANOMA FOCALLY INVOLVES DEEP MARGIN  ULCERATION: ABSENT  SATELLITOSIS: ABSENT  MITOTIC INDEX: 2/MM2  LYMPHO-VASCULAR INVASION: ABSENT  NEUTROTROPISM: ABSENT  TUMOR-INFILTRATING LYMPHOCYTES: NON-BRISK  TUMOR REGRESSION: ABSENT  LYMPH NODES: N/A  PATHOLOGIC STAGE: PT2A (STAGING PER CUTANEOUS AJCC 8TH EDITION)  COMMENT:  Sections show a nodular, spindled, amelanotic neoplasm extending from  the epithelial surface into the subepithelium/dermis. Mitotic activity  and focal pleomorphism are present. The tumor cells are negative for p63  and CK5/6, and strongly positive with S-100, MelanA, Sox-10, and HMB-45,  confirmatory of the diagnosis. Dr. Andria Frames is in agreement. A complete  re-excision is recommended. I spoke with Dr. Quincy Simmonds about this case on  08/18/21.

## 2021-08-29 ENCOUNTER — Inpatient Hospital Stay: Payer: PPO | Attending: Gynecologic Oncology | Admitting: Gynecologic Oncology

## 2021-08-29 ENCOUNTER — Other Ambulatory Visit: Payer: Self-pay

## 2021-08-29 ENCOUNTER — Encounter: Payer: Self-pay | Admitting: Gynecologic Oncology

## 2021-08-29 VITALS — BP 157/74 | HR 71 | Temp 98.7°F | Resp 16 | Ht 65.95 in | Wt 230.2 lb

## 2021-08-29 DIAGNOSIS — Z7984 Long term (current) use of oral hypoglycemic drugs: Secondary | ICD-10-CM | POA: Insufficient documentation

## 2021-08-29 DIAGNOSIS — Z6837 Body mass index (BMI) 37.0-37.9, adult: Secondary | ICD-10-CM

## 2021-08-29 DIAGNOSIS — E785 Hyperlipidemia, unspecified: Secondary | ICD-10-CM | POA: Insufficient documentation

## 2021-08-29 DIAGNOSIS — C519 Malignant neoplasm of vulva, unspecified: Secondary | ICD-10-CM | POA: Diagnosis not present

## 2021-08-29 DIAGNOSIS — G4700 Insomnia, unspecified: Secondary | ICD-10-CM | POA: Diagnosis not present

## 2021-08-29 DIAGNOSIS — E119 Type 2 diabetes mellitus without complications: Secondary | ICD-10-CM | POA: Diagnosis not present

## 2021-08-29 DIAGNOSIS — I10 Essential (primary) hypertension: Secondary | ICD-10-CM | POA: Insufficient documentation

## 2021-08-29 DIAGNOSIS — J45909 Unspecified asthma, uncomplicated: Secondary | ICD-10-CM | POA: Insufficient documentation

## 2021-08-29 DIAGNOSIS — C439 Malignant melanoma of skin, unspecified: Secondary | ICD-10-CM

## 2021-08-29 DIAGNOSIS — Z8616 Personal history of COVID-19: Secondary | ICD-10-CM | POA: Diagnosis not present

## 2021-08-29 DIAGNOSIS — Z79899 Other long term (current) drug therapy: Secondary | ICD-10-CM | POA: Diagnosis not present

## 2021-08-29 DIAGNOSIS — K219 Gastro-esophageal reflux disease without esophagitis: Secondary | ICD-10-CM | POA: Insufficient documentation

## 2021-08-29 DIAGNOSIS — N3281 Overactive bladder: Secondary | ICD-10-CM | POA: Diagnosis not present

## 2021-08-29 DIAGNOSIS — E039 Hypothyroidism, unspecified: Secondary | ICD-10-CM | POA: Insufficient documentation

## 2021-08-29 NOTE — Patient Instructions (Addendum)
It was nice to meet you today.  I will call you once I have the results from the PET scan.  In the meantime, we will get you set up with a medical record number at [facility] and send a referral for you to see one of my partners to discuss the procedure we talked about today: removing some the area on your vulva where biopsy showed melanoma and doing a sentinel lymph nod biopsy.  Please reach out to your dermatologist about seeing if you can get in for your yearly visit in the next month.

## 2021-08-30 DIAGNOSIS — C519 Malignant neoplasm of vulva, unspecified: Secondary | ICD-10-CM | POA: Insufficient documentation

## 2021-08-30 DIAGNOSIS — Z6837 Body mass index (BMI) 37.0-37.9, adult: Secondary | ICD-10-CM | POA: Insufficient documentation

## 2021-09-01 ENCOUNTER — Telehealth: Payer: Self-pay | Admitting: *Deleted

## 2021-09-01 DIAGNOSIS — I10 Essential (primary) hypertension: Secondary | ICD-10-CM | POA: Diagnosis not present

## 2021-09-01 DIAGNOSIS — N3281 Overactive bladder: Secondary | ICD-10-CM | POA: Diagnosis not present

## 2021-09-01 DIAGNOSIS — E785 Hyperlipidemia, unspecified: Secondary | ICD-10-CM | POA: Diagnosis not present

## 2021-09-01 DIAGNOSIS — Z5181 Encounter for therapeutic drug level monitoring: Secondary | ICD-10-CM | POA: Diagnosis not present

## 2021-09-01 DIAGNOSIS — F329 Major depressive disorder, single episode, unspecified: Secondary | ICD-10-CM | POA: Diagnosis not present

## 2021-09-01 DIAGNOSIS — C439 Malignant melanoma of skin, unspecified: Secondary | ICD-10-CM | POA: Diagnosis not present

## 2021-09-01 DIAGNOSIS — E1165 Type 2 diabetes mellitus with hyperglycemia: Secondary | ICD-10-CM | POA: Diagnosis not present

## 2021-09-01 DIAGNOSIS — Z0000 Encounter for general adult medical examination without abnormal findings: Secondary | ICD-10-CM | POA: Diagnosis not present

## 2021-09-01 DIAGNOSIS — E039 Hypothyroidism, unspecified: Secondary | ICD-10-CM | POA: Diagnosis not present

## 2021-09-01 DIAGNOSIS — G4700 Insomnia, unspecified: Secondary | ICD-10-CM | POA: Diagnosis not present

## 2021-09-01 DIAGNOSIS — M549 Dorsalgia, unspecified: Secondary | ICD-10-CM | POA: Diagnosis not present

## 2021-09-01 NOTE — Telephone Encounter (Signed)
Per Dr Berline Lopes fax records and referral to [facility] surgery

## 2021-09-02 DIAGNOSIS — M545 Low back pain, unspecified: Secondary | ICD-10-CM | POA: Diagnosis not present

## 2021-09-02 DIAGNOSIS — G4733 Obstructive sleep apnea (adult) (pediatric): Secondary | ICD-10-CM | POA: Diagnosis not present

## 2021-09-02 DIAGNOSIS — M4722 Other spondylosis with radiculopathy, cervical region: Secondary | ICD-10-CM | POA: Diagnosis not present

## 2021-09-02 DIAGNOSIS — M4326 Fusion of spine, lumbar region: Secondary | ICD-10-CM | POA: Diagnosis not present

## 2021-09-02 IMAGING — MG MM PLC BREAST LOC DEV 1ST LESION INC MAMMO GUIDE*L*
7 series · 7 of 7 positions shown · non-contrast
Comparison: Previous exam(s).

CLINICAL DATA: Patient with a LEFT breast papilloma scheduled for
surgical excision requiring preoperative radioactive seed
localization.

EXAM:
MAMMOGRAPHIC GUIDED RADIOACTIVE SEED LOCALIZATION OF THE LEFT BREAST

[L ML (1 of 3)]
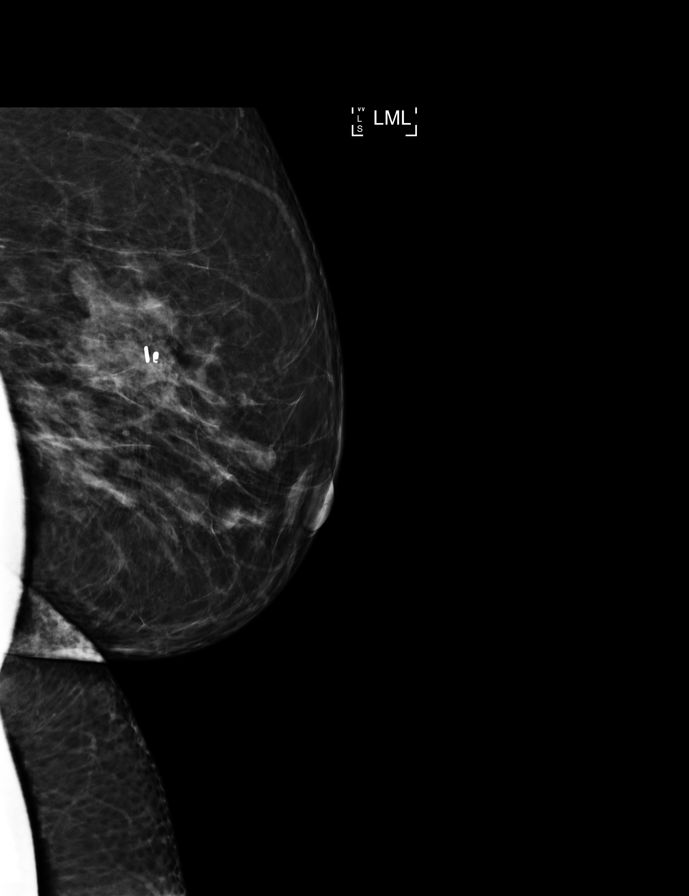

[L CC (1 of 4)]
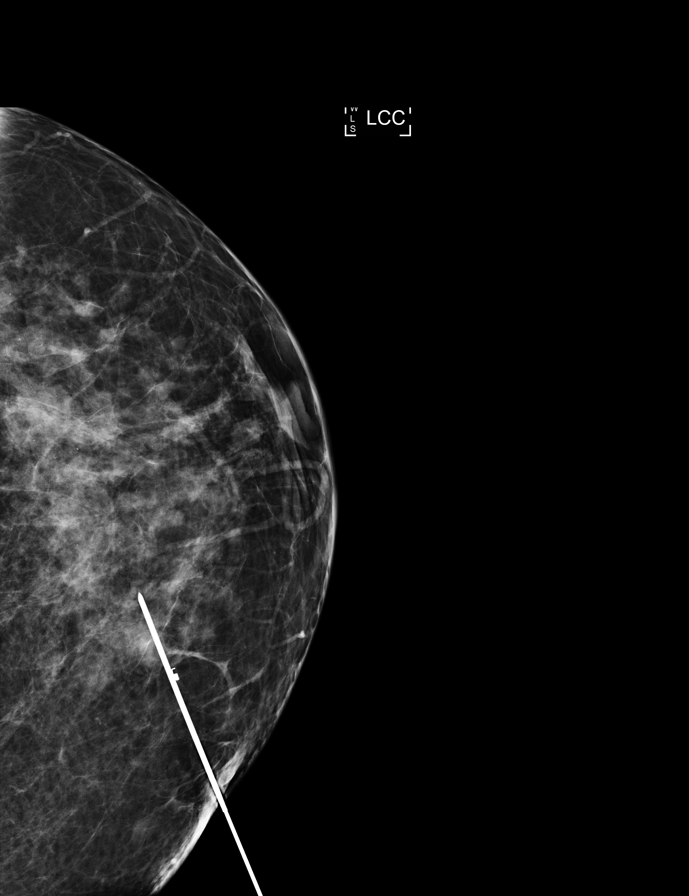

[L CC (2 of 4)]
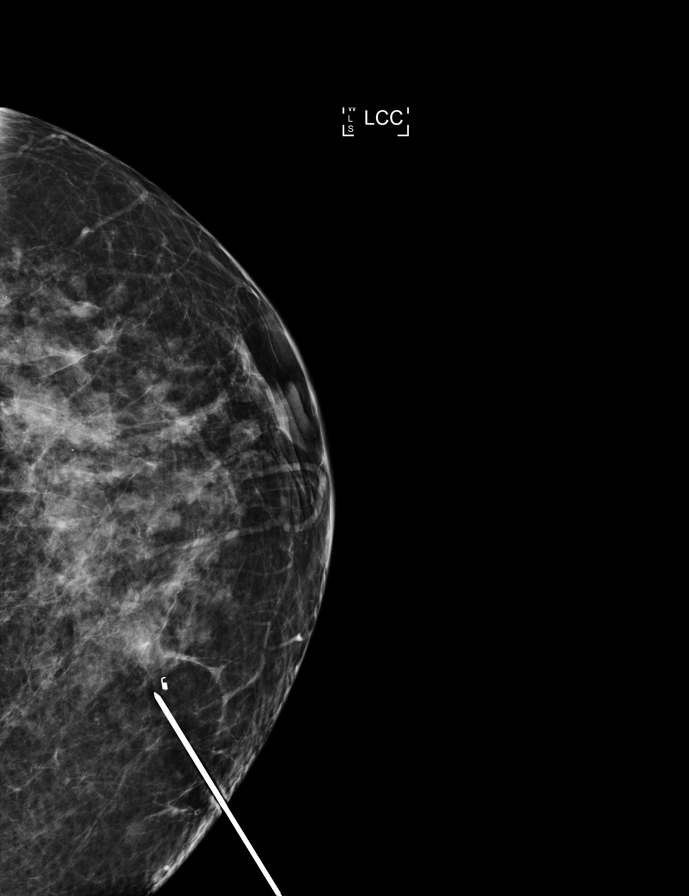

[L ML (2 of 3)]
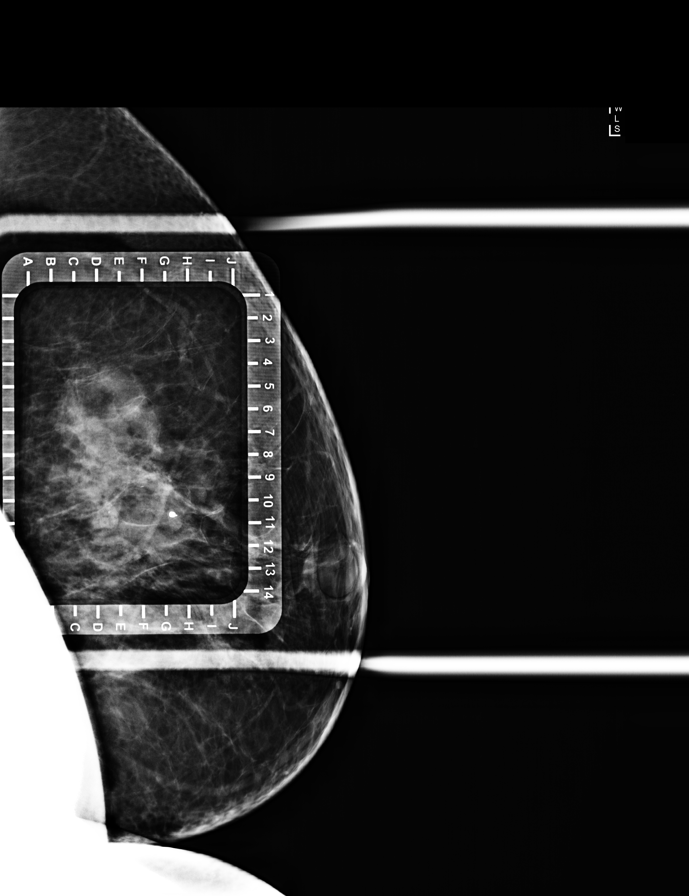

[L CC (3 of 4)]
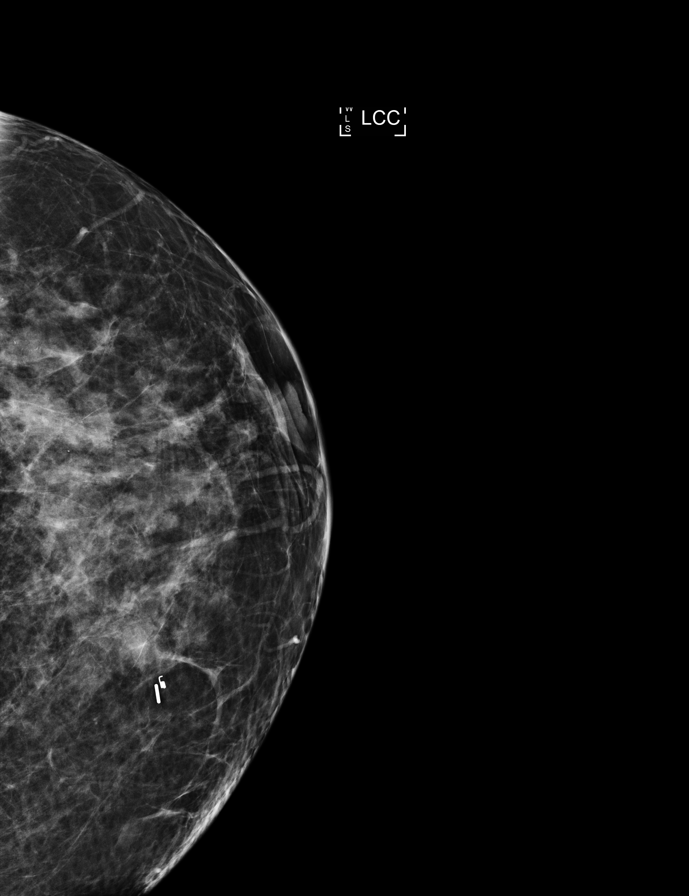

[L ML (3 of 3)]
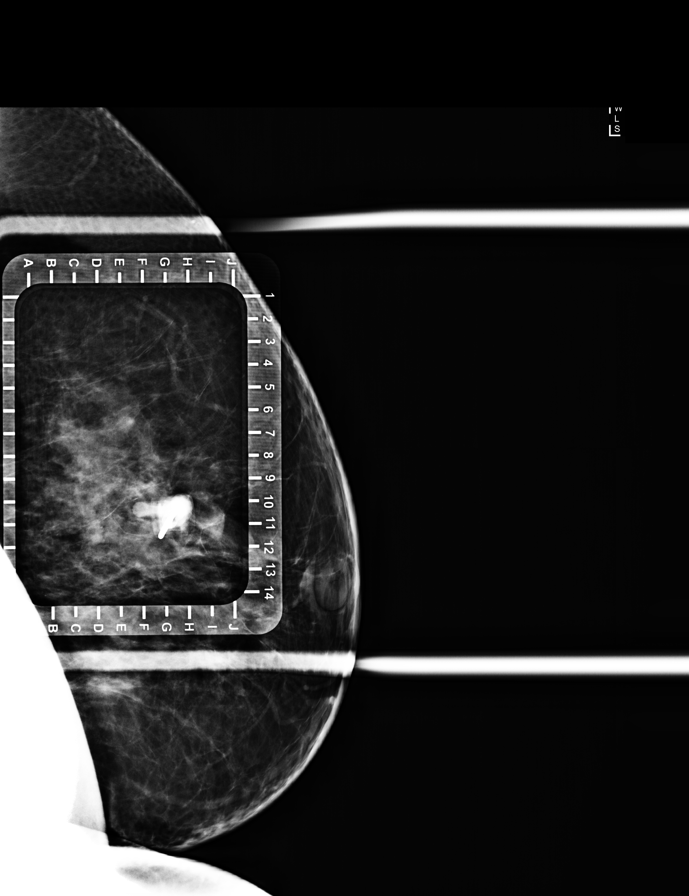

[L CC (4 of 4)]
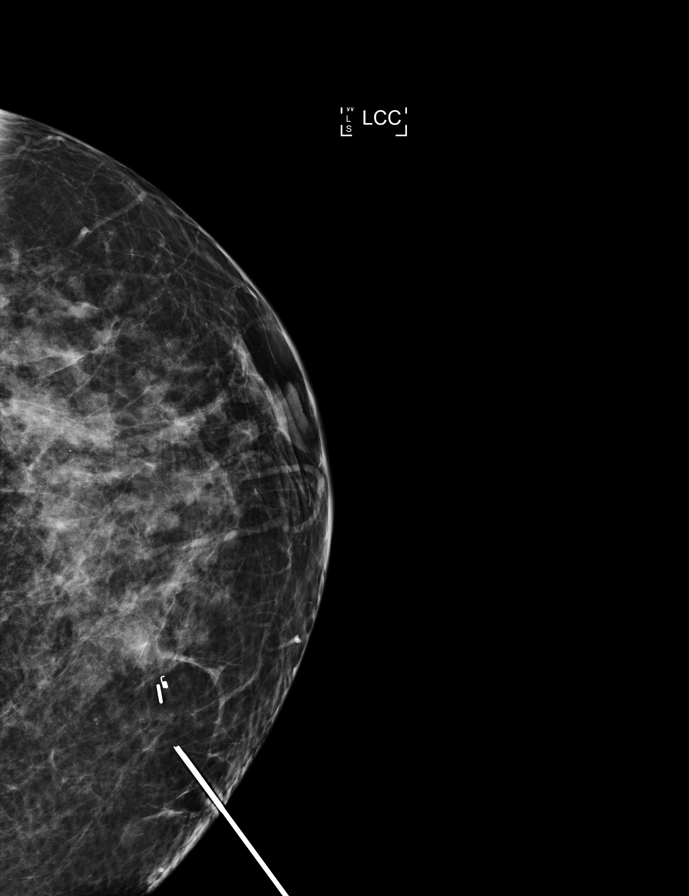

[7 of 7 positions shown; findings below may reference images not displayed]

FINDINGS: Patient presents for radioactive seed localization prior to surgical
excision. I met with the patient and we discussed the procedure of
seed localization including benefits and alternatives. We discussed
the high likelihood of a successful procedure. We discussed the
risks of the procedure including infection, bleeding, tissue injury
and further surgery. We discussed the low dose of radioactivity
involved in the procedure. Informed, written consent was given.

The usual time-out protocol was performed immediately prior to the
procedure.

Using mammographic guidance, sterile technique, 1% lidocaine and an
[id number] radioactive seed, the coil shaped clip within the inner LEFT
breast was localized using a medial approach. The follow-up
mammogram images confirm the seed in the expected location and were
marked for Dr. Romeo Romo.

Follow-up survey of the patient confirms presence of the radioactive
seed.

Order number of [id number] seed:  [id number].

Total activity:  0.246 millicuries reference Date: 06/05/2019

The patient tolerated the procedure well and was released from the
[REDACTED]. She was given instructions regarding seed removal.
IMPRESSION: Radioactive seed localization LEFT breast. No apparent
complications.

## 2021-09-04 DIAGNOSIS — D485 Neoplasm of uncertain behavior of skin: Secondary | ICD-10-CM | POA: Diagnosis not present

## 2021-09-04 DIAGNOSIS — R350 Frequency of micturition: Secondary | ICD-10-CM | POA: Diagnosis not present

## 2021-09-04 DIAGNOSIS — N3946 Mixed incontinence: Secondary | ICD-10-CM | POA: Diagnosis not present

## 2021-09-04 NOTE — Telephone Encounter (Signed)
Patient to see Dr Dionne Milo 3/13 at 1 pm at [facility] ?

## 2021-09-12 ENCOUNTER — Ambulatory Visit (HOSPITAL_COMMUNITY)
Admission: RE | Admit: 2021-09-12 | Discharge: 2021-09-12 | Disposition: A | Discharge status: Home / Self Care | Payer: PPO | Source: Ambulatory Visit | Attending: Gynecologic Oncology | Admitting: Gynecologic Oncology

## 2021-09-12 ENCOUNTER — Other Ambulatory Visit: Payer: Self-pay

## 2021-09-12 DIAGNOSIS — R911 Solitary pulmonary nodule: Secondary | ICD-10-CM | POA: Insufficient documentation

## 2021-09-12 DIAGNOSIS — C439 Malignant melanoma of skin, unspecified: Secondary | ICD-10-CM | POA: Insufficient documentation

## 2021-09-12 DIAGNOSIS — C519 Malignant neoplasm of vulva, unspecified: Secondary | ICD-10-CM | POA: Diagnosis not present

## 2021-09-12 LAB — GLUCOSE, CAPILLARY: Glucose-Capillary: 98 mg/dL (ref 70–99)

## 2021-09-12 MED ORDER — FLUDEOXYGLUCOSE F - 18 (FDG) INJECTION
11.5000 | Freq: Once | INTRAVENOUS | Status: AC
  Administered 2021-09-12: 12.5 via INTRAVENOUS

## 2021-09-15 DIAGNOSIS — C519 Malignant neoplasm of vulva, unspecified: Secondary | ICD-10-CM | POA: Diagnosis not present

## 2021-09-17 DIAGNOSIS — C519 Malignant neoplasm of vulva, unspecified: Secondary | ICD-10-CM | POA: Diagnosis not present

## 2021-09-18 DIAGNOSIS — R918 Other nonspecific abnormal finding of lung field: Secondary | ICD-10-CM | POA: Diagnosis not present

## 2021-09-18 DIAGNOSIS — J40 Bronchitis, not specified as acute or chronic: Secondary | ICD-10-CM | POA: Diagnosis not present

## 2021-09-22 DIAGNOSIS — J45901 Unspecified asthma with (acute) exacerbation: Secondary | ICD-10-CM | POA: Diagnosis not present

## 2021-09-24 DIAGNOSIS — N3946 Mixed incontinence: Secondary | ICD-10-CM | POA: Diagnosis not present

## 2021-10-02 DIAGNOSIS — G4733 Obstructive sleep apnea (adult) (pediatric): Secondary | ICD-10-CM | POA: Diagnosis not present

## 2021-10-06 DIAGNOSIS — L578 Other skin changes due to chronic exposure to nonionizing radiation: Secondary | ICD-10-CM | POA: Diagnosis not present

## 2021-10-06 DIAGNOSIS — L570 Actinic keratosis: Secondary | ICD-10-CM | POA: Diagnosis not present

## 2021-10-06 DIAGNOSIS — D225 Melanocytic nevi of trunk: Secondary | ICD-10-CM | POA: Diagnosis not present

## 2021-10-06 DIAGNOSIS — C510 Malignant neoplasm of labium majus: Secondary | ICD-10-CM | POA: Diagnosis not present

## 2021-10-06 DIAGNOSIS — L814 Other melanin hyperpigmentation: Secondary | ICD-10-CM | POA: Diagnosis not present

## 2021-10-06 DIAGNOSIS — L821 Other seborrheic keratosis: Secondary | ICD-10-CM | POA: Diagnosis not present

## 2021-10-06 DIAGNOSIS — D239 Other benign neoplasm of skin, unspecified: Secondary | ICD-10-CM | POA: Diagnosis not present

## 2021-10-08 DIAGNOSIS — R350 Frequency of micturition: Secondary | ICD-10-CM | POA: Diagnosis not present

## 2021-10-08 DIAGNOSIS — N3946 Mixed incontinence: Secondary | ICD-10-CM | POA: Diagnosis not present

## 2021-10-09 DIAGNOSIS — G4733 Obstructive sleep apnea (adult) (pediatric): Secondary | ICD-10-CM | POA: Diagnosis not present

## 2021-10-20 DIAGNOSIS — J209 Acute bronchitis, unspecified: Secondary | ICD-10-CM | POA: Diagnosis not present

## 2021-11-03 DIAGNOSIS — C801 Malignant (primary) neoplasm, unspecified: Secondary | ICD-10-CM

## 2021-11-03 HISTORY — DX: Malignant (primary) neoplasm, unspecified: C80.1

## 2021-11-04 DIAGNOSIS — K219 Gastro-esophageal reflux disease without esophagitis: Secondary | ICD-10-CM | POA: Diagnosis not present

## 2021-11-04 DIAGNOSIS — Z79890 Hormone replacement therapy (postmenopausal): Secondary | ICD-10-CM | POA: Diagnosis not present

## 2021-11-04 DIAGNOSIS — J45909 Unspecified asthma, uncomplicated: Secondary | ICD-10-CM | POA: Diagnosis not present

## 2021-11-04 DIAGNOSIS — C511 Malignant neoplasm of labium minus: Secondary | ICD-10-CM | POA: Diagnosis not present

## 2021-11-04 DIAGNOSIS — D038 Melanoma in situ of other sites: Secondary | ICD-10-CM | POA: Diagnosis not present

## 2021-11-04 DIAGNOSIS — Z79899 Other long term (current) drug therapy: Secondary | ICD-10-CM | POA: Diagnosis not present

## 2021-11-04 DIAGNOSIS — E039 Hypothyroidism, unspecified: Secondary | ICD-10-CM | POA: Diagnosis not present

## 2021-11-04 DIAGNOSIS — C519 Malignant neoplasm of vulva, unspecified: Secondary | ICD-10-CM | POA: Diagnosis not present

## 2021-11-04 DIAGNOSIS — E669 Obesity, unspecified: Secondary | ICD-10-CM | POA: Diagnosis not present

## 2021-11-04 DIAGNOSIS — E119 Type 2 diabetes mellitus without complications: Secondary | ICD-10-CM | POA: Diagnosis not present

## 2021-11-04 DIAGNOSIS — K769 Liver disease, unspecified: Secondary | ICD-10-CM | POA: Diagnosis not present

## 2021-11-04 DIAGNOSIS — I10 Essential (primary) hypertension: Secondary | ICD-10-CM | POA: Diagnosis not present

## 2021-11-04 DIAGNOSIS — E785 Hyperlipidemia, unspecified: Secondary | ICD-10-CM | POA: Diagnosis not present

## 2021-11-04 DIAGNOSIS — C439 Malignant melanoma of skin, unspecified: Secondary | ICD-10-CM | POA: Diagnosis not present

## 2021-11-24 DIAGNOSIS — Z09 Encounter for follow-up examination after completed treatment for conditions other than malignant neoplasm: Secondary | ICD-10-CM | POA: Diagnosis not present

## 2021-11-24 DIAGNOSIS — C519 Malignant neoplasm of vulva, unspecified: Secondary | ICD-10-CM | POA: Diagnosis not present

## 2021-11-28 ENCOUNTER — Telehealth: Payer: Self-pay | Admitting: *Deleted

## 2021-11-28 NOTE — Telephone Encounter (Signed)
Spoke with pt this afternoon to inform her that per Joylene John, NP we are holding June 21 for surgery with Dr.Tucker but this may be adjusted as needed. Plan for office visit on 12/15/21 to discuss further. Pt verbalized understanding.

## 2021-12-02 DIAGNOSIS — G4700 Insomnia, unspecified: Secondary | ICD-10-CM | POA: Diagnosis not present

## 2021-12-02 DIAGNOSIS — Z1231 Encounter for screening mammogram for malignant neoplasm of breast: Secondary | ICD-10-CM | POA: Diagnosis not present

## 2021-12-02 DIAGNOSIS — F3341 Major depressive disorder, recurrent, in partial remission: Secondary | ICD-10-CM | POA: Diagnosis not present

## 2021-12-02 DIAGNOSIS — E039 Hypothyroidism, unspecified: Secondary | ICD-10-CM | POA: Diagnosis not present

## 2021-12-02 DIAGNOSIS — I10 Essential (primary) hypertension: Secondary | ICD-10-CM | POA: Diagnosis not present

## 2021-12-02 DIAGNOSIS — C519 Malignant neoplasm of vulva, unspecified: Secondary | ICD-10-CM | POA: Diagnosis not present

## 2021-12-02 DIAGNOSIS — E119 Type 2 diabetes mellitus without complications: Secondary | ICD-10-CM | POA: Diagnosis not present

## 2021-12-02 DIAGNOSIS — E118 Type 2 diabetes mellitus with unspecified complications: Secondary | ICD-10-CM | POA: Diagnosis not present

## 2021-12-02 DIAGNOSIS — Z7984 Long term (current) use of oral hypoglycemic drugs: Secondary | ICD-10-CM | POA: Diagnosis not present

## 2021-12-06 DIAGNOSIS — C519 Malignant neoplasm of vulva, unspecified: Secondary | ICD-10-CM | POA: Diagnosis not present

## 2021-12-10 ENCOUNTER — Encounter: Payer: Self-pay | Admitting: Gynecologic Oncology

## 2021-12-12 ENCOUNTER — Telehealth: Payer: Self-pay | Admitting: *Deleted

## 2021-12-12 NOTE — Telephone Encounter (Signed)
Called and rescheduled the patient's appt from Monday to Tuesday

## 2021-12-14 DIAGNOSIS — C519 Malignant neoplasm of vulva, unspecified: Secondary | ICD-10-CM | POA: Diagnosis not present

## 2021-12-15 ENCOUNTER — Inpatient Hospital Stay: Payer: PPO | Admitting: Gynecologic Oncology

## 2021-12-15 DIAGNOSIS — C519 Malignant neoplasm of vulva, unspecified: Secondary | ICD-10-CM

## 2021-12-16 ENCOUNTER — Inpatient Hospital Stay: Payer: PPO | Admitting: Gynecologic Oncology

## 2021-12-17 ENCOUNTER — Encounter (HOSPITAL_BASED_OUTPATIENT_CLINIC_OR_DEPARTMENT_OTHER): Payer: Self-pay | Admitting: Gynecologic Oncology

## 2021-12-17 ENCOUNTER — Telehealth: Payer: Self-pay | Admitting: *Deleted

## 2021-12-17 NOTE — Telephone Encounter (Signed)
Per Melissa APP moved patient appt from tomorrow to Monday. Dr Berline Lopes needed to also see the patient. Appt moved and rescheduled; patient called with new date/time. Patient verbalized understanding

## 2021-12-17 NOTE — Progress Notes (Signed)
Spoke w/ via phone for pre-op interview--- [name] needs dos---- ISTAT and EKG              Lab results------ COVID test -----patient states asymptomatic no test needed Arrive at -------1230 NPO after MN NO Solid Food.  Clear liquids from MN until---1130 Med rec completed Medications to take morning of surgery ----- Norvasc, Flovent , Neurontin, Levothyroxine, Myrbetriq, and Tylenol. Diabetic medication ----- No  Patient instructed no nail polish to be worn day of surgery Patient instructed to bring photo id and insurance card day of surgery Patient aware to have Driver (ride ) / caregiver Husband [name]   for 24 hours after surgery  Patient Special Instructions ----- Bring CPAP day of surgery. Pre-Op special Istructions ----- Patient verbalized understanding of instructions that were given at this phone interview. Patient denies shortness of breath, chest pain, fever, cough at this phone interview.

## 2021-12-18 ENCOUNTER — Inpatient Hospital Stay: Payer: PPO | Admitting: Gynecologic Oncology

## 2021-12-18 ENCOUNTER — Encounter: Payer: Self-pay | Admitting: Gynecologic Oncology

## 2021-12-18 DIAGNOSIS — G4733 Obstructive sleep apnea (adult) (pediatric): Secondary | ICD-10-CM | POA: Diagnosis not present

## 2021-12-22 ENCOUNTER — Telehealth: Payer: Self-pay | Admitting: *Deleted

## 2021-12-22 ENCOUNTER — Inpatient Hospital Stay (HOSPITAL_BASED_OUTPATIENT_CLINIC_OR_DEPARTMENT_OTHER): Payer: PPO | Admitting: Gynecologic Oncology

## 2021-12-22 ENCOUNTER — Inpatient Hospital Stay: Payer: PPO | Attending: Gynecologic Oncology | Admitting: Gynecologic Oncology

## 2021-12-22 ENCOUNTER — Encounter: Payer: Self-pay | Admitting: Gynecologic Oncology

## 2021-12-22 ENCOUNTER — Other Ambulatory Visit: Payer: Self-pay

## 2021-12-22 VITALS — BP 138/72 | HR 69 | Temp 98.5°F | Resp 18 | Ht 66.0 in | Wt 225.0 lb

## 2021-12-22 DIAGNOSIS — D038 Melanoma in situ of other sites: Secondary | ICD-10-CM | POA: Diagnosis not present

## 2021-12-22 DIAGNOSIS — C519 Malignant neoplasm of vulva, unspecified: Secondary | ICD-10-CM | POA: Insufficient documentation

## 2021-12-22 MED ORDER — HYDROCODONE-ACETAMINOPHEN 5-325 MG PO TABS: 1.0000 | ORAL_TABLET | Freq: Four times a day (QID) | ORAL | 0 refills | Status: DC | PRN

## 2021-12-22 NOTE — Telephone Encounter (Signed)
Patient scheduled for a CT scan on 8/7 at 8 am and lab appt after her MD visit on 7/24. Patient notified of appt dates/times

## 2021-12-22 NOTE — Patient Instructions (Addendum)
Preparing for your Surgery  Plan for surgery on December 24, 2021 with Dr. Jeral Pinch at [facility]. You will be scheduled for wide local excision of the vulva.   Pre-operative Testing -You will receive a phone call from presurgical testing at [facility] to discuss surgery instructions and arrange for lab work if needed.  -Bring your insurance card, copy of an advanced directive if applicable, medication list.  -You should not be taking blood thinners or aspirin at least ten days prior to surgery unless instructed by your surgeon.  -Do not take supplements such as fish oil (omega 3), red yeast rice, turmeric before your surgery. You want to avoid medications with aspirin in them including headache powders such as BC or Goody's), Excedrin migraine.  Day Before Surgery at [facility] will be advised you can have clear liquids up until 3 hours before your surgery.    Your role in recovery Your role is to become active as soon as directed by your doctor, while still giving yourself time to heal.  Rest when you feel tired. You will be asked to do the following in order to speed your recovery:  - Cough and breathe deeply. This helps to clear and expand your lungs and can prevent pneumonia after surgery.  - [place]. Do mild physical activity. Walking or moving your legs help your circulation and body functions return to normal. Do not try to get up or walk alone the first time after surgery.   -If you develop swelling on one leg or the other, pain in the back of your leg, redness/warmth in one of your legs, please call the office or go to the Emergency Room to have a doppler to rule out a blood clot. For shortness of breath, chest pain-seek care in the Emergency Room as soon as possible. - Actively manage your pain. Managing your pain lets you move in comfort. We will ask you to rate your pain on a scale of zero to 10. It is your  responsibility to tell your doctor or nurse where and how much you hurt so your pain can be treated.  Special Considerations -Your final pathology results from surgery should be available around one week after surgery and the results will be relayed to you when available.  -FMLA forms can be faxed to [phone] and please allow 5-7 business days for completion.  Pain Management After Surgery -You have been prescribed your pain medication before surgery so that you can have these available when you are discharged from the hospital. The pain medication is for use ONLY AFTER surgery and a new prescription will not be given.   -Make sure that you have Tylenol and Ibuprofen at home to use on a regular basis after surgery for pain control. We recommend alternating the medications every hour to six hours since they work differently and are processed in the body differently for pain relief.  -Review the attached handout on narcotic use and their risks and side effects.   Bowel Regimen -Plan to take Miralax once a day after surgery for the first several days to a week after surgery. It is important to prevent constipation and drink adequate amounts of liquids. You can stop taking this medication when you are not taking pain medication and you are back on your normal bowel routine.  Risks of Surgery Risks of surgery are low but include bleeding, infection, damage to surrounding structures, re-operation, blood  clots, and very rarely death.  AFTER SURGERY INSTRUCTIONS  Return to work:  2-3 weeks if applicable, variable based on your occupation  We recommend purchasing several bags of frozen green peas and dividing them into ziploc bags. You will want to keep these in the freezer and have them ready to use as ice packs to the vulvar incision. Once the ice pack is no longer cold, you can get another from the freezer. The frozen peas mold to your body better than a regular ice pack.   Activity: 1. Be  up and out of the bed during the day.  Take a nap if needed.  You may walk up steps but be careful and use the hand rail.  Stair climbing will tire you more than you think, you may need to stop part way and rest.   2. No lifting or straining for 4 weeks over 10 pounds. No pushing, pulling, straining for 4 weeks.  3. No driving for minimum 24 hours after surgery.  Do not drive if you are taking narcotic pain medicine and make sure that your reaction time has returned.   4. You can shower as soon as the next day after surgery. Shower daily. No tub baths or submerging your body in water until cleared by your surgeon. If you have the soap that was given to you by pre-surgical testing that was used before surgery, you do not need to use it afterwards because this can irritate your incisions.   5. No sexual activity and nothing in the vagina for 4 weeks.  6. You may experience vaginal spotting and discharge after surgery.  The spotting is normal but if you experience heavy bleeding, call our office.  7. Take Tylenol or ibuprofen first for pain and only use narcotic pain medication for severe pain not relieved by the Tylenol or Ibuprofen.  Monitor your Tylenol intake to a max of 4,000 mg in a 24 hour period. You can alternate these medications after surgery.  Diet: 1. Low sodium Heart Healthy Diet is recommended but you are cleared to resume your normal (before surgery) diet after your procedure.  2. It is safe to use a laxative, such as Miralax or Colace, if you have difficulty moving your bowels. Plan to take Miralax for the first several days after surgery to prevent constipation. Wound Care: 1. Keep clean and dry.  Shower daily.  Reasons to call the Doctor: Fever - Oral temperature greater than 100.4 degrees Fahrenheit Foul-smelling vaginal discharge Difficulty urinating Nausea and vomiting Increased pain at the site of the incision that is unrelieved with pain medicine. Difficulty breathing  with or without chest pain New calf pain especially if only on one side Sudden, continuing increased vaginal bleeding with or without clots.   Contacts: For questions or concerns you should contact:  Dr. Jeral Pinch at [phone]  Joylene John, NP at [phone]  After Hours: call [phone] and have the GYN Oncologist paged/contacted (after 5 pm or on the weekends).  Messages sent via mychart are for non-urgent matters and are not responded to after hours so for urgent needs, please call the after hours number.

## 2021-12-22 NOTE — Progress Notes (Signed)
Gynecologic Oncology Return Clinic Visit  12/22/21  Reason for Visit: treatment planning  Treatment History: 08/12/21: Vulvar biopsy revealed vulvar melanoma 09/12/21: Mild hypermetabolism in the lower vulva likely related to recent biopsy/surgery.  No discrete hypermetabolic mass.  No adenopathy. 11/04/2021: Left vulvar wide local excision and left inguinal sentinel lymph node biopsy with Surgical Oncology. Operative Findings: 1 cm melanotic lesion on medial aspect of anterior left labia minora. 1 sentinel lymph node identified in the subcutaneous fat superficial to the external oblique.  Pathology:  Diagnosis  A: Sentinel lymph node, left inguinal, removal - Negative for melanoma in one lymph node (0/1). B: Vulva, excision - Malignant melanoma. Melanoma in situ extends broadly to 11-12-10 o'clock peripheral margin and focally to the 3 o'clock peripheral margin. Margins are negative for invasive melanoma. Type: Vulvar Clark Level: IV Breslow thickness: 1.8 mm Growth phase: Vertical, spindled melanocytes with light pigment Mitoses: per square millimeter Nuclear grade: 2/3 Tumor infiltrating lymphocytes:  Regression: Not identified Ulceration: Not identified  Microscopic satellite: Not identified Vascular invasion: Not identified Perineural invasion: Not identified Associated nevus: Not identified  Margins: Melanoma in situ extends broadly to 11-12-10 o'clock peripheral margin (B3-B5) and focally to the 3 o'clock peripheral margin (B3). Margins are negative for invasive melanoma Pathologic (pT, AJCC 8th edition) staging: pT2a C: Vulva, deep margin, excision - Connective tissue.  - Negative for melanoma 11/19/2021 Tumor Board recommendations.  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 Plan: Repeat excision of vulvar melanoma in situ  - Medical Oncology referral for  consideration of additional treatment (observation vs pembro vs locoregional radiation), scheduled 5/22 Medical oncology recommendations: CT C/A/P q 3 months for 1 year then q 6 months until year 3, then annual. Consider imiquimod for the in situ component if the margins on reexcision were to show melanoma in situ. Obtain full molecular profile with [facility] testing - testing showed no targetable mutations or obvious germline mutation.  Interval History: Doing well.  Recovered well from surgery.  Denies any vulvar bleeding, pruritus, or pain.  Past Medical/Surgical History: Past Medical History:  Diagnosis Date   Asthma    has rescue inhaler, now on Flovent   Chronic back pain    Diabetes mellitus without complication (HCC)    on medformin, on Ozempic   GERD (gastroesophageal reflux disease)    History of COVID-19 04/05/2021   Hyperlipemia    Hypertension    Hypothyroidism    Insomnia    OAB (overactive bladder)    Seasonal allergies    Sleep apnea    uses a cpap    Past Surgical History:  Procedure Laterality Date   ANKLE ARTHROTOMY  2003   left-fx   APPENDECTOMY     back fusion  02/14/2019   L4 back fusion   BACK SURGERY  2010   lumb disk   BREAST LUMPECTOMY WITH RADIOACTIVE SEED LOCALIZATION Left 06/13/2019   Procedure: LEFT BREAST LUMPECTOMY WITH RADIOACTIVE SEED LOCALIZATION;  Surgeon: Cornett, Thomas, MD;  Location: [facility];  Service: General;  Laterality: Left;   CARPAL TUNNEL RELEASE     rt   CARPAL TUNNEL RELEASE Left 08/01/2013   Procedure: LEFT CARPAL TUNNEL RELEASE;  Surgeon: Gary R Kuzma, MD;  Location: [facility];  Service: Orthopedics;  Laterality: Left;   CESAREAN SECTION  83,86   COLONOSCOPY     DILATATION & CURETTAGE/HYSTEROSCOPY WITH MYOSURE N/A   08/23/2018   Procedure: DILATATION & CURETTAGE/HYSTEROSCOPY WITH MYOSURE POLYPECTOMY;  Surgeon: Amundson C Silva, Brook E, MD;  Location: [facility];  Service:  Gynecology;  Laterality: N/A;  fibroid resection   DILATION AND CURETTAGE OF UTERUS     x2   MASS EXCISION N/A 08/04/2017   Procedure: EXCISION LIPOMA UPPER BACK x2 AND NECK;  Surgeon: Cornett, Thomas, MD;  Location: [facility];  Service: General;  Laterality: N/A;   NASAL SEPTOPLASTY W/ TURBINOPLASTY Bilateral 06/21/2015   Procedure: NASAL SEPTOPLASTY WITH BILATERAL TURBINATE REDUCTION;  Surgeon: David Shoemaker, MD;  Location: [facility];  Service: ENT;  Laterality: Bilateral;   SINOSCOPY     TONSILLECTOMY     TUBAL LIGATION      Family History  Problem Relation Age of Onset   Allergic rhinitis Father    Allergic rhinitis Sister    Asthma Sister    Endometrial cancer Sister 61   Multiple myeloma Sister    Cancer Maternal Grandmother    Asthma Paternal Aunt    Angioedema Neg Hx    Eczema Neg Hx    Immunodeficiency Neg Hx    Urticaria Neg Hx    Ovarian cancer Neg Hx    Breast cancer Neg Hx    Colon cancer Neg Hx    Prostate cancer Neg Hx    Pancreatic cancer Neg Hx     Social History   Socioeconomic History   Marital status: Married    Spouse name: Not on file   Number of children: Not on file   Years of education: Not on file   Highest education level: Not on file  Occupational History   Occupation: retored  Tobacco Use   Smoking status: Never   Smokeless tobacco: Never  Vaping Use   Vaping Use: Never used  Substance and Sexual Activity   Alcohol use: Yes    Comment: 2 glasses of wine/month   Drug use: No   Sexual activity: Yes    Birth control/protection: Post-menopausal  Other Topics Concern   Not on file  Social History Narrative   Not on file   Social Determinants of Health   Financial Resource Strain: Not on file  Food Insecurity: Not on file  Transportation Needs: Not on file  Physical Activity: Not on file  Stress: Not on file  Social Connections: Not on file    Current Medications:  Current Outpatient Medications:     5-Hydroxytryptophan 200 MG CAPS, Take by mouth., Disp: , Rfl:    acetaminophen (TYLENOL) 500 MG tablet, Take by mouth., Disp: , Rfl:    albuterol (VENTOLIN HFA) 108 (90 Base) MCG/ACT inhaler, , Disp: , Rfl:    amLODipine (NORVASC) 5 MG tablet, Take 5 mg by mouth daily., Disp: , Rfl:    azelastine (ASTELIN) 0.1 % nasal spray, Place 2 sprays into both nostrils 2 (two) times daily. Use in each nostril as directed, Disp: 60 mL, Rfl: 5   baclofen (LIORESAL) 20 MG tablet, Take 20 mg by mouth 3 (three) times daily as needed., Disp: , Rfl:    CINNAMON PO, 2 capsules Orally once a day, Disp: , Rfl:    famotidine-calcium carbonate-magnesium hydroxide (PEPCID COMPLETE) 10-800-165 MG chewable tablet, 1 tablet Orally Twice a day, Disp: , Rfl:    fluticasone (FLONASE) 50 MCG/ACT nasal spray, Place into both nostrils daily., Disp: , Rfl:    fluticasone (FLOVENT HFA) 44 MCG/ACT inhaler, Inhale 1 puff into the lungs daily., Disp: 1 each, Rfl:   5   gabapentin (NEURONTIN) 400 MG capsule, Take 1 capsule by mouth 4 (four) times daily., Disp: , Rfl:    GLUCOSAMINE-CHONDROITIN PO, Take 1 tablet by mouth daily. Move Free Ultra Triple Action, Disp: , Rfl:    glucose blood test strip, as directed finger stick once a day for 90 days, Disp: , Rfl:    hydrochlorothiazide (MICROZIDE) 12.5 MG capsule, 1 1/2, Disp: , Rfl:    levocetirizine (XYZAL) 5 MG tablet, TAKE 1 TABLET BY MOUTH EVERY EVENING, Disp: 90 tablet, Rfl: 1   levothyroxine (SYNTHROID) 112 MCG tablet, 1 tablet on an empty stomach in the morning, Disp: , Rfl:    losartan (COZAAR) 100 MG tablet, Take 100 mg by mouth daily., Disp: , Rfl:    Melatonin 3 MG TABS, Take 3 mg by mouth as needed., Disp: , Rfl:    metFORMIN (GLUCOPHAGE-XR) 500 MG 24 hr tablet, 1 tablet with evening meal Orally Once a day for 90 days, Disp: , Rfl:    montelukast (SINGULAIR) 10 MG tablet, Take 1 tablet (10 mg total) by mouth at bedtime., Disp: 30 tablet, Rfl: 5   Multiple Vitamin (MULTIVITAMIN  WITH MINERALS) TABS tablet, Take 1 tablet by mouth at bedtime., Disp: , Rfl:    MYRBETRIQ 50 MG TB24 tablet, Take 50 mg by mouth daily., Disp: , Rfl:    Omega-3 Fatty Acids (FISH OIL) 1200 MG CAPS, Take 1,200 mg by mouth daily., Disp: , Rfl:    sertraline (ZOLOFT) 50 MG tablet, Take 50 mg by mouth daily., Disp: , Rfl:    simvastatin (ZOCOR) 20 MG tablet, Take 20 mg by mouth at bedtime. , Disp: , Rfl:    Turmeric 500 MG CAPS, 2 capsules Orally once a day, Disp: , Rfl:    zolpidem (AMBIEN) 10 MG tablet, Take 5 mg by mouth at bedtime as needed for sleep. , Disp: , Rfl:    Semaglutide, 1 MG/DOSE, (OZEMPIC, 1 MG/DOSE,) 4 MG/3ML SOPN, 1 mg, Disp: , Rfl:   Review of Systems: Denies appetite changes, fevers, chills, fatigue, unexplained weight changes. Denies hearing loss, neck lumps or masses, mouth sores, ringing in ears or voice changes. Denies cough or wheezing.  Denies shortness of breath. Denies chest pain or palpitations. Denies leg swelling. Denies abdominal distention, pain, blood in stools, constipation, diarrhea, nausea, vomiting, or early satiety. Denies pain with intercourse, dysuria, frequency, hematuria or incontinence. Denies hot flashes, pelvic pain, vaginal bleeding or vaginal discharge.   Denies joint pain, back pain or muscle pain/cramps. Denies itching, rash, or wounds. Denies dizziness, headaches, numbness or seizures. Denies swollen lymph nodes or glands, denies easy bruising or bleeding. Denies anxiety, depression, confusion, or decreased concentration.  Physical Exam: BP 138/72 (BP Location: Left Arm, Patient Position: Sitting)   Pulse 69   Temp 98.5 F (36.9 C) (Oral)   Resp 18   Ht 5' 6" (1.676 m)   Wt 225 lb (102.1 kg)   LMP 07/06/1998 (Approximate)   SpO2 96%   BMI 36.32 kg/m  General: Alert, oriented, no acute distress. HEENT: Normocephalic, atraumatic, sclera anicteric. Chest: Clear to auscultation bilaterally.  No wheezes or rhonchi. Cardiovascular:  Regular rate and rhythm, no murmurs. Abdomen: Obese, soft, nontender.  Normoactive bowel sounds.  No masses or hepatosplenomegaly appreciated.   Extremities: Grossly normal range of motion.  Warm, well perfused.  No edema bilaterally.  Well-healed left groin incision from sentinel lymph node procedure. Skin: No rashes or lesions noted. Lymphatics: No cervical, supraclavicular, or inguinal adenopathy. GU:  Normal appearing external genitalia without erythema, excoriation, or lesions.  Prior scar is only faintly visualized.  Some mild hyperpigmentation at this location.  Scar itself is approximately 1 cm from the clitoris.  Laboratory & Radiologic Studies: None new  Assessment & Plan: [name] is a 71 y.o. woman with Stage IB vulvar melanoma who presents for treatment discussion regarding repeat vulvar excision.  Patient had recent surgery for vulvar melanoma with vulvar excision and unilateral sentinel lymph node biopsy.  Given positive margins for melanoma in situ, recommendation was made for reexcision.  Patient presents today to discuss this.  Her prior resection is in close proximity to the clitoris and clitoral hood.  I offered 2 options.  The first is to attempt reexcision without damage to the clitoris knowing that this may result in a close margin along the medial aspect.  The second would be to attempt to resect an adequate visual margin even if this meant excision of the clitoris.  Patient's preference is to reexcise without taking the clitoris if at all possible.  The medical oncologist that she saw at [facility] recommended that if reexcision margins are not still positive for melanoma in situ, we could consider use of imiquimod.  Plan will be for wide local excision later this week.  Patient had a lot of good questions about vulvar melanoma.  We discussed again the rarity of this disease.  She has met with one of the medical oncologist/melanoma specialist at [facility].  She is curious about a  second opinion.  I encouraged her to seek a second opinion if this would be helpful for her.  She also has a number of questions about adjuvant treatment.  We reviewed again that given the stage of her vulvar melanoma, no adjuvant treatment has been recommended.  We discussed imaging recommendations per medical oncology at [facility].  We will plan on a CT scan approximately 3 months after her excision, which will be in August.  She and I will have visits every 3 months for vulvar exam.  She has already seen her dermatologist and has a follow-up in September.  28 minutes of total time was spent for this patient encounter, including preparation, face-to-face counseling with the patient and coordination of care, and documentation of the encounter.  Marga Gramajo, MD  Division of Gynecologic Oncology  Department of Obstetrics and Gynecology  [facility]   

## 2021-12-22 NOTE — H&P (View-Only) (Signed)
Gynecologic Oncology Return Clinic Visit  12/22/21  Reason for Visit: treatment planning  Treatment History: 08/12/21: Vulvar biopsy revealed vulvar melanoma 09/12/21: Mild hypermetabolism in the lower vulva likely related to recent biopsy/surgery.  No discrete hypermetabolic mass.  No adenopathy. 11/04/2021: Left vulvar wide local excision and left inguinal sentinel lymph node biopsy with Surgical Oncology. Operative Findings: 1 cm melanotic lesion on medial aspect of anterior left labia minora. 1 sentinel lymph node identified in the subcutaneous fat superficial to the external oblique.  Pathology:  Diagnosis  A: Sentinel lymph node, left inguinal, removal - Negative for melanoma in one lymph node (0/1). B: Vulva, excision - Malignant melanoma. Melanoma in situ extends broadly to 11-12-10 o'clock peripheral margin and focally to the 3 o'clock peripheral margin. Margins are negative for invasive melanoma. Type: Vulvar Clark Level: IV Breslow thickness: 1.8 mm Growth phase: Vertical, spindled melanocytes with light pigment Mitoses: per square millimeter Nuclear grade: 2/3 Tumor infiltrating lymphocytes:  Regression: Not identified Ulceration: Not identified  Microscopic satellite: Not identified Vascular invasion: Not identified Perineural invasion: Not identified Associated nevus: Not identified  Margins: Melanoma in situ extends broadly to 11-12-10 o'clock peripheral margin (B3-B5) and focally to the 3 o'clock peripheral margin (B3). Margins are negative for invasive melanoma Pathologic (pT, AJCC 8th edition) staging: pT2a C: Vulva, deep margin, excision - Connective tissue.  - Negative for melanoma 11/19/2021 Tumor Board recommendations.  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 Plan: Repeat excision of vulvar melanoma in situ  - Medical Oncology referral for  consideration of additional treatment (observation vs pembro vs locoregional radiation), scheduled 5/22 Medical oncology recommendations: CT C/A/P q 3 months for 1 year then q 6 months until year 3, then annual. Consider imiquimod for the in situ component if the margins on reexcision were to show melanoma in situ. Obtain full molecular profile with [employer] testing - testing showed no targetable mutations or obvious germline mutation.  Interval History: Doing well.  Recovered well from surgery.  Denies any vulvar bleeding, pruritus, or pain.  Past Medical/Surgical History: Past Medical History:  Diagnosis Date   Asthma    has rescue inhaler, now on Flovent   Chronic back pain    Diabetes mellitus without complication ([place])    on medformin, on Ozempic   GERD (gastroesophageal reflux disease)    History of COVID-19 04/05/2021   Hyperlipemia    Hypertension    Hypothyroidism    Insomnia    OAB (overactive bladder)    Seasonal allergies    Sleep apnea    uses a cpap    Past Surgical History:  Procedure Laterality Date   ANKLE ARTHROTOMY  2003   left-fx   APPENDECTOMY     back fusion  02/14/2019   L4 back fusion   BACK SURGERY  2010   lumb disk   BREAST LUMPECTOMY WITH RADIOACTIVE SEED LOCALIZATION Left 06/13/2019   Procedure: LEFT BREAST LUMPECTOMY WITH RADIOACTIVE SEED LOCALIZATION;  Surgeon: Erroll Luna, MD;  Location: [place];  Service: General;  Laterality: Left;   CARPAL TUNNEL RELEASE     rt   CARPAL TUNNEL RELEASE Left 08/01/2013   Procedure: LEFT CARPAL TUNNEL RELEASE;  Surgeon: Wynonia Sours, MD;  Location: [facility];  Service: Orthopedics;  Laterality: Left;   CESAREAN SECTION  83,86   COLONOSCOPY     DILATATION & CURETTAGE/HYSTEROSCOPY WITH MYOSURE N/A  08/23/2018   Procedure: DILATATION & CURETTAGE/HYSTEROSCOPY WITH MYOSURE POLYPECTOMY;  Surgeon: Nunzio Cobbs, MD;  Location: [facility];  Service:  Gynecology;  Laterality: N/A;  fibroid resection   DILATION AND CURETTAGE OF UTERUS     x2   MASS EXCISION N/A 08/04/2017   Procedure: EXCISION LIPOMA UPPER BACK x2 AND NECK;  Surgeon: Erroll Luna, MD;  Location: [place];  Service: General;  Laterality: N/A;   NASAL SEPTOPLASTY W/ TURBINOPLASTY Bilateral 06/21/2015   Procedure: NASAL SEPTOPLASTY WITH BILATERAL TURBINATE REDUCTION;  Surgeon: Jerrell Belfast, MD;  Location: [facility] OR;  Service: ENT;  Laterality: Bilateral;   SINOSCOPY     TONSILLECTOMY     TUBAL LIGATION      Family History  Problem Relation Age of Onset   Allergic rhinitis Father    Allergic rhinitis Sister    Asthma Sister    Endometrial cancer Sister 62   Multiple myeloma Sister    Cancer Maternal Grandmother    Asthma Paternal Aunt    Angioedema Neg Hx    Eczema Neg Hx    Immunodeficiency Neg Hx    Urticaria Neg Hx    Ovarian cancer Neg Hx    Breast cancer Neg Hx    Colon cancer Neg Hx    Prostate cancer Neg Hx    Pancreatic cancer Neg Hx     Social History   Socioeconomic History   Marital status: Married    Spouse name: Not on file   Number of children: Not on file   Years of education: Not on file   Highest education level: Not on file  Occupational History   Occupation: retored  Tobacco Use   Smoking status: Never   Smokeless tobacco: Never  Vaping Use   Vaping Use: Never used  Substance and Sexual Activity   Alcohol use: Yes    Comment: 2 glasses of wine/month   Drug use: No   Sexual activity: Yes    Birth control/protection: Post-menopausal  Other Topics Concern   Not on file  Social History Narrative   Not on file   Social Determinants of Health   Financial Resource Strain: Not on file  Food Insecurity: Not on file  Transportation Needs: Not on file  Physical Activity: Not on file  Stress: Not on file  Social Connections: Not on file    Current Medications:  Current Outpatient Medications:     5-Hydroxytryptophan 200 MG CAPS, Take by mouth., Disp: , Rfl:    acetaminophen (TYLENOL) 500 MG tablet, Take by mouth., Disp: , Rfl:    albuterol (VENTOLIN HFA) 108 (90 Base) MCG/ACT inhaler, , Disp: , Rfl:    amLODipine (NORVASC) 5 MG tablet, Take 5 mg by mouth daily., Disp: , Rfl:    azelastine (ASTELIN) 0.1 % nasal spray, Place 2 sprays into both nostrils 2 (two) times daily. Use in each nostril as directed, Disp: 60 mL, Rfl: 5   baclofen (LIORESAL) 20 MG tablet, Take 20 mg by mouth 3 (three) times daily as needed., Disp: , Rfl:    CINNAMON PO, 2 capsules Orally once a day, Disp: , Rfl:    famotidine-calcium carbonate-magnesium hydroxide (PEPCID COMPLETE) 10-800-165 MG chewable tablet, 1 tablet Orally Twice a day, Disp: , Rfl:    fluticasone (FLONASE) 50 MCG/ACT nasal spray, Place into both nostrils daily., Disp: , Rfl:    fluticasone (FLOVENT HFA) 44 MCG/ACT inhaler, Inhale 1 puff into the lungs daily., Disp: 1 each, Rfl:  5   gabapentin (NEURONTIN) 400 MG capsule, Take 1 capsule by mouth 4 (four) times daily., Disp: , Rfl:    GLUCOSAMINE-CHONDROITIN PO, Take 1 tablet by mouth daily. Move Free Ultra Triple Action, Disp: , Rfl:    glucose blood test strip, as directed finger stick once a day for 90 days, Disp: , Rfl:    hydrochlorothiazide (MICROZIDE) 12.5 MG capsule, 1 1/2, Disp: , Rfl:    levocetirizine (XYZAL) 5 MG tablet, TAKE 1 TABLET BY MOUTH EVERY EVENING, Disp: 90 tablet, Rfl: 1   levothyroxine (SYNTHROID) 112 MCG tablet, 1 tablet on an empty stomach in the morning, Disp: , Rfl:    losartan (COZAAR) 100 MG tablet, Take 100 mg by mouth daily., Disp: , Rfl:    Melatonin 3 MG TABS, Take 3 mg by mouth as needed., Disp: , Rfl:    metFORMIN (GLUCOPHAGE-XR) 500 MG 24 hr tablet, 1 tablet with evening meal Orally Once a day for 90 days, Disp: , Rfl:    montelukast (SINGULAIR) 10 MG tablet, Take 1 tablet (10 mg total) by mouth at bedtime., Disp: 30 tablet, Rfl: 5   Multiple Vitamin (MULTIVITAMIN  WITH MINERALS) TABS tablet, Take 1 tablet by mouth at bedtime., Disp: , Rfl:    MYRBETRIQ 50 MG TB24 tablet, Take 50 mg by mouth daily., Disp: , Rfl:    Omega-3 Fatty Acids (FISH OIL) 1200 MG CAPS, Take 1,200 mg by mouth daily., Disp: , Rfl:    sertraline (ZOLOFT) 50 MG tablet, Take 50 mg by mouth daily., Disp: , Rfl:    simvastatin (ZOCOR) 20 MG tablet, Take 20 mg by mouth at bedtime. , Disp: , Rfl:    Turmeric 500 MG CAPS, 2 capsules Orally once a day, Disp: , Rfl:    zolpidem (AMBIEN) 10 MG tablet, Take 5 mg by mouth at bedtime as needed for sleep. , Disp: , Rfl:    Semaglutide, 1 MG/DOSE, (OZEMPIC, 1 MG/DOSE,) 4 MG/3ML SOPN, 1 mg, Disp: , Rfl:   Review of Systems: Denies appetite changes, fevers, chills, fatigue, unexplained weight changes. Denies hearing loss, neck lumps or masses, mouth sores, ringing in ears or voice changes. Denies cough or wheezing.  Denies shortness of breath. Denies chest pain or palpitations. Denies leg swelling. Denies abdominal distention, pain, blood in stools, constipation, diarrhea, nausea, vomiting, or early satiety. Denies pain with intercourse, dysuria, frequency, hematuria or incontinence. Denies hot flashes, pelvic pain, vaginal bleeding or vaginal discharge.   Denies joint pain, back pain or muscle pain/cramps. Denies itching, rash, or wounds. Denies dizziness, headaches, numbness or seizures. Denies swollen lymph nodes or glands, denies easy bruising or bleeding. Denies anxiety, depression, confusion, or decreased concentration.  Physical Exam: BP 138/72 (BP Location: Left Arm, Patient Position: Sitting)   Pulse 69   Temp 98.5 F (36.9 C) (Oral)   Resp 18   Ht 5' 6" (1.676 m)   Wt 225 lb (102.1 kg)   LMP 07/06/1998 (Approximate)   SpO2 96%   BMI 36.32 kg/m  General: Alert, oriented, no acute distress. HEENT: Normocephalic, atraumatic, sclera anicteric. Chest: Clear to auscultation bilaterally.  No wheezes or rhonchi. Cardiovascular:  Regular rate and rhythm, no murmurs. Abdomen: Obese, soft, nontender.  Normoactive bowel sounds.  No masses or hepatosplenomegaly appreciated.   Extremities: Grossly normal range of motion.  Warm, well perfused.  No edema bilaterally.  Well-healed left groin incision from sentinel lymph node procedure. Skin: No rashes or lesions noted. Lymphatics: No cervical, supraclavicular, or inguinal adenopathy. GU:  Normal appearing external genitalia without erythema, excoriation, or lesions.  Prior scar is only faintly visualized.  Some mild hyperpigmentation at this location.  Scar itself is approximately 1 cm from the clitoris.  Laboratory & Radiologic Studies: None new  Assessment & Plan: [name] is a 71 y.o. woman with Stage IB vulvar melanoma who presents for treatment discussion regarding repeat vulvar excision.  Patient had recent surgery for vulvar melanoma with vulvar excision and unilateral sentinel lymph node biopsy.  Given positive margins for melanoma in situ, recommendation was made for reexcision.  Patient presents today to discuss this.  Her prior resection is in close proximity to the clitoris and clitoral hood.  I offered 2 options.  The first is to attempt reexcision without damage to the clitoris knowing that this may result in a close margin along the medial aspect.  The second would be to attempt to resect an adequate visual margin even if this meant excision of the clitoris.  Patient's preference is to reexcise without taking the clitoris if at all possible.  The medical oncologist that she saw at [facility] recommended that if reexcision margins are not still positive for melanoma in situ, we could consider use of imiquimod.  Plan will be for wide local excision later this week.  Patient had a lot of good questions about vulvar melanoma.  We discussed again the rarity of this disease.  She has met with one of the medical oncologist/melanoma specialist at [facility]  She is curious about a  second opinion.  I encouraged her to seek a second opinion if this would be helpful for her.  She also has a number of questions about adjuvant treatment.  We reviewed again that given the stage of her vulvar melanoma, no adjuvant treatment has been recommended.  We discussed imaging recommendations per medical oncology at [facility].  We will plan on a CT scan approximately 3 months after her excision, which will be in August.  She and I will have visits every 3 months for vulvar exam.  She has already seen her dermatologist and has a follow-up in September.  28 minutes of total time was spent for this patient encounter, including preparation, face-to-face counseling with the patient and coordination of care, and documentation of the encounter.  Jeral Pinch, MD  Division of Gynecologic Oncology  Department of Obstetrics and Gynecology  [facility]

## 2021-12-22 NOTE — Progress Notes (Signed)
Patient here with her husband for follow up with Dr. Jeral Pinch and for a pre-operative appointment prior to her scheduled surgery on December 24, 2021. She is scheduled for WLE of the vulva. The surgery was discussed in detail.  See after visit summary for additional details.    Discussed post-op pain management in detail including the aspects of the enhanced recovery pathway.  Advised her that a new prescription would be sent in for hydrocodone/APAP and it is only to be used for after her upcoming surgery.  We discussed the use of tylenol post-op and to monitor for a maximum of 4,000 mg in a 24 hour period. Discussed bowel regimen and she will plan to use miralax she has at home.     Discussed measures to take at home to prevent DVT including frequent mobility.  Reportable signs and symptoms of DVT discussed. Post-operative instructions discussed and expectations for after surgery. Incisional care discussed as well including reportable signs and symptoms including erythema, drainage, wound separation.     5 minutes spent with the patient.  Verbalizing understanding of material discussed. No needs or concerns voiced at the end of the visit.   Advised patient and family to call for any needs.  Advised that her post-operative medication had been prescribed and could be picked up at any time.    This appointment is included in the global surgical bundle as pre-operative teaching and has no charge.

## 2021-12-23 ENCOUNTER — Telehealth: Payer: Self-pay

## 2021-12-23 NOTE — Telephone Encounter (Signed)
Telephone call to check on pre-operative status.  Patient compliant with pre-operative instructions.  Reinforced nothing to eat after midnight. Clear liquids until 11:30. Patient to arrive at 12:30. No questions or concerns voiced.  Instructed to call for any needs. 

## 2021-12-24 ENCOUNTER — Other Ambulatory Visit: Payer: Self-pay

## 2021-12-24 ENCOUNTER — Ambulatory Visit (HOSPITAL_BASED_OUTPATIENT_CLINIC_OR_DEPARTMENT_OTHER): Payer: PPO | Admitting: Anesthesiology

## 2021-12-24 ENCOUNTER — Encounter (HOSPITAL_BASED_OUTPATIENT_CLINIC_OR_DEPARTMENT_OTHER): Payer: Self-pay | Admitting: Gynecologic Oncology

## 2021-12-24 ENCOUNTER — Encounter (HOSPITAL_BASED_OUTPATIENT_CLINIC_OR_DEPARTMENT_OTHER)
Admission: RE | Disposition: A | Discharge status: Home / Self Care | Payer: Self-pay | Source: Home / Self Care | Attending: Gynecologic Oncology

## 2021-12-24 ENCOUNTER — Ambulatory Visit (HOSPITAL_BASED_OUTPATIENT_CLINIC_OR_DEPARTMENT_OTHER)
Admission: RE | Admit: 2021-12-24 | Discharge: 2021-12-24 | Disposition: A | Discharge status: Home / Self Care | Payer: PPO | Attending: Gynecologic Oncology | Admitting: Gynecologic Oncology

## 2021-12-24 DIAGNOSIS — K219 Gastro-esophageal reflux disease without esophagitis: Secondary | ICD-10-CM | POA: Insufficient documentation

## 2021-12-24 DIAGNOSIS — Z7984 Long term (current) use of oral hypoglycemic drugs: Secondary | ICD-10-CM | POA: Insufficient documentation

## 2021-12-24 DIAGNOSIS — C519 Malignant neoplasm of vulva, unspecified: Secondary | ICD-10-CM | POA: Diagnosis not present

## 2021-12-24 DIAGNOSIS — J45909 Unspecified asthma, uncomplicated: Secondary | ICD-10-CM | POA: Insufficient documentation

## 2021-12-24 DIAGNOSIS — E119 Type 2 diabetes mellitus without complications: Secondary | ICD-10-CM | POA: Diagnosis not present

## 2021-12-24 DIAGNOSIS — E039 Hypothyroidism, unspecified: Secondary | ICD-10-CM | POA: Diagnosis not present

## 2021-12-24 DIAGNOSIS — D038 Melanoma in situ of other sites: Secondary | ICD-10-CM | POA: Insufficient documentation

## 2021-12-24 DIAGNOSIS — G4730 Sleep apnea, unspecified: Secondary | ICD-10-CM | POA: Diagnosis not present

## 2021-12-24 DIAGNOSIS — I10 Essential (primary) hypertension: Secondary | ICD-10-CM | POA: Insufficient documentation

## 2021-12-24 HISTORY — PX: VULVECTOMY: SHX1086

## 2021-12-24 LAB — POCT I-STAT, CHEM 8
BUN: 13 mg/dL (ref 8–23)
Calcium, Ion: 1.22 mmol/L (ref 1.15–1.40)
Chloride: 104 mmol/L (ref 98–111)
Creatinine, Ser: 0.5 mg/dL (ref 0.44–1.00)
Glucose, Bld: 99 mg/dL (ref 70–99)
HCT: 40 % (ref 36.0–46.0)
Hemoglobin: 13.6 g/dL (ref 12.0–15.0)
Potassium: 3.7 mmol/L (ref 3.5–5.1)
Sodium: 141 mmol/L (ref 135–145)
TCO2: 23 mmol/L (ref 22–32)

## 2021-12-24 LAB — GLUCOSE, CAPILLARY: Glucose-Capillary: 98 mg/dL (ref 70–99)

## 2021-12-24 SURGERY — WIDE EXCISION VULVECTOMY
Anesthesia: General | Site: Vagina

## 2021-12-24 MED ORDER — ONDANSETRON HCL 4 MG/2ML IJ SOLN
INTRAMUSCULAR | Status: DC | PRN
  Administered 2021-12-24: 4 mg via INTRAVENOUS

## 2021-12-24 MED ORDER — OXYCODONE HCL 5 MG/5ML PO SOLN: 5.0000 mg | Freq: Once | ORAL | Status: DC | PRN

## 2021-12-24 MED ORDER — DEXAMETHASONE SODIUM PHOSPHATE 10 MG/ML IJ SOLN: 4.0000 mg | INTRAMUSCULAR | Status: DC

## 2021-12-24 MED ORDER — KETOROLAC TROMETHAMINE 30 MG/ML IJ SOLN
INTRAMUSCULAR | Status: DC | PRN
  Administered 2021-12-24: 30 mg via INTRAVENOUS

## 2021-12-24 MED ORDER — LIDOCAINE 2% (20 MG/ML) 5 ML SYRINGE
INTRAMUSCULAR | Status: DC | PRN
  Administered 2021-12-24: 100 mg via INTRAVENOUS

## 2021-12-24 MED ORDER — DEXAMETHASONE SODIUM PHOSPHATE 10 MG/ML IJ SOLN
INTRAMUSCULAR | Status: DC | PRN
  Administered 2021-12-24: 5 mg via INTRAVENOUS

## 2021-12-24 MED ORDER — PROPOFOL 10 MG/ML IV BOLUS
INTRAVENOUS | Status: AC
  Filled 2021-12-24: qty 20

## 2021-12-24 MED ORDER — PROPOFOL 1000 MG/100ML IV EMUL
INTRAVENOUS | Status: AC
  Filled 2021-12-24: qty 100

## 2021-12-24 MED ORDER — DEXAMETHASONE SODIUM PHOSPHATE 10 MG/ML IJ SOLN
INTRAMUSCULAR | Status: AC
  Filled 2021-12-24: qty 3

## 2021-12-24 MED ORDER — 0.9 % SODIUM CHLORIDE (POUR BTL) OPTIME
TOPICAL | Status: DC | PRN
  Administered 2021-12-24: 500 mL

## 2021-12-24 MED ORDER — ACETAMINOPHEN 500 MG PO TABS
1000.0000 mg | ORAL_TABLET | ORAL | Status: AC
  Administered 2021-12-24: 1000 mg via ORAL

## 2021-12-24 MED ORDER — LACTATED RINGERS IV SOLN: INTRAVENOUS | Status: DC

## 2021-12-24 MED ORDER — FENTANYL CITRATE (PF) 100 MCG/2ML IJ SOLN
INTRAMUSCULAR | Status: AC
  Filled 2021-12-24: qty 2

## 2021-12-24 MED ORDER — MIDAZOLAM HCL 2 MG/2ML IJ SOLN
INTRAMUSCULAR | Status: AC
  Filled 2021-12-24: qty 2

## 2021-12-24 MED ORDER — PROPOFOL 10 MG/ML IV BOLUS
INTRAVENOUS | Status: DC | PRN
  Administered 2021-12-24: 10 mg via INTRAVENOUS
  Administered 2021-12-24: 150 mg via INTRAVENOUS
  Administered 2021-12-24 (×2): 10 mg via INTRAVENOUS

## 2021-12-24 MED ORDER — AMISULPRIDE (ANTIEMETIC) 5 MG/2ML IV SOLN: 10.0000 mg | Freq: Once | INTRAVENOUS | Status: DC | PRN

## 2021-12-24 MED ORDER — ONDANSETRON HCL 4 MG/2ML IJ SOLN: 4.0000 mg | Freq: Four times a day (QID) | INTRAMUSCULAR | Status: DC | PRN

## 2021-12-24 MED ORDER — ONDANSETRON HCL 4 MG PO TABS: 4.0000 mg | ORAL_TABLET | Freq: Four times a day (QID) | ORAL | Status: DC | PRN

## 2021-12-24 MED ORDER — HYDROCODONE-ACETAMINOPHEN 5-325 MG PO TABS: 1.0000 | ORAL_TABLET | ORAL | Status: DC | PRN

## 2021-12-24 MED ORDER — KETOROLAC TROMETHAMINE 30 MG/ML IJ SOLN
INTRAMUSCULAR | Status: AC
  Filled 2021-12-24: qty 5

## 2021-12-24 MED ORDER — TRAMADOL HCL 50 MG PO TABS: 50.0000 mg | ORAL_TABLET | Freq: Four times a day (QID) | ORAL | Status: DC | PRN

## 2021-12-24 MED ORDER — ONDANSETRON HCL 4 MG/2ML IJ SOLN
INTRAMUSCULAR | Status: AC
  Filled 2021-12-24: qty 10

## 2021-12-24 MED ORDER — OXYCODONE HCL 5 MG PO TABS: 5.0000 mg | ORAL_TABLET | Freq: Once | ORAL | Status: DC | PRN

## 2021-12-24 MED ORDER — PROPOFOL 500 MG/50ML IV EMUL
INTRAVENOUS | Status: AC
  Filled 2021-12-24: qty 50

## 2021-12-24 MED ORDER — FENTANYL CITRATE (PF) 100 MCG/2ML IJ SOLN
25.0000 ug | INTRAMUSCULAR | Status: DC | PRN
  Administered 2021-12-24: 25 ug via INTRAVENOUS

## 2021-12-24 MED ORDER — MIDAZOLAM HCL 2 MG/2ML IJ SOLN
INTRAMUSCULAR | Status: DC | PRN
  Administered 2021-12-24: 1 mg via INTRAVENOUS

## 2021-12-24 MED ORDER — ONDANSETRON HCL 4 MG/2ML IJ SOLN: 4.0000 mg | Freq: Once | INTRAMUSCULAR | Status: DC | PRN

## 2021-12-24 MED ORDER — LIDOCAINE HCL (PF) 2 % IJ SOLN
INTRAMUSCULAR | Status: AC
  Filled 2021-12-24: qty 25

## 2021-12-24 MED ORDER — BUPIVACAINE HCL 0.25 % IJ SOLN
INTRAMUSCULAR | Status: DC | PRN
  Administered 2021-12-24: 16 mL

## 2021-12-24 MED ORDER — ACETAMINOPHEN 500 MG PO TABS
ORAL_TABLET | ORAL | Status: AC
  Filled 2021-12-24: qty 2

## 2021-12-24 MED ORDER — ACETAMINOPHEN 500 MG PO TABS: 1000.0000 mg | ORAL_TABLET | ORAL | Status: DC

## 2021-12-24 MED ORDER — FENTANYL CITRATE (PF) 250 MCG/5ML IJ SOLN
INTRAMUSCULAR | Status: DC | PRN
  Administered 2021-12-24: 25 ug via INTRAVENOUS
  Administered 2021-12-24: 50 ug via INTRAVENOUS
  Administered 2021-12-24: 25 ug via INTRAVENOUS

## 2021-12-24 SURGICAL SUPPLY — 28 items
BLADE CLIPPER SENSICLIP SURGIC (BLADE) IMPLANT
BLADE SURG 15 STRL LF DISP TIS (BLADE) ×1 IMPLANT
BLADE SURG 15 STRL SS (BLADE) ×2
CATH ROBINSON RED A/P 16FR (CATHETERS) ×2 IMPLANT
GAUZE 4X4 16PLY ~~LOC~~+RFID DBL (SPONGE) ×4 IMPLANT
GLOVE BIO SURGEON STRL SZ 6 (GLOVE) ×4 IMPLANT
GOWN STRL REUS W/TWL LRG LVL3 (GOWN DISPOSABLE) ×2 IMPLANT
KIT TURNOVER CYSTO (KITS) ×2 IMPLANT
NDL HYPO 25X1 1.5 SAFETY (NEEDLE) ×1 IMPLANT
NEEDLE HYPO 25X1 1.5 SAFETY (NEEDLE) ×2 IMPLANT
NS IRRIG 500ML POUR BTL (IV SOLUTION) ×2 IMPLANT
PACK PERINEAL COLD (PAD) ×2 IMPLANT
PACK VAGINAL WOMENS (CUSTOM PROCEDURE TRAY) ×2 IMPLANT
PENCIL BUTTON HOLSTER BLD 10FT (ELECTRODE) ×2 IMPLANT
PUNCH BIOPSY DERMAL 3 (INSTRUMENTS) IMPLANT
PUNCH BIOPSY DERMAL 3MM (INSTRUMENTS)
PUNCH BIOPSY DERMAL 4MM (INSTRUMENTS) IMPLANT
SUT VIC AB 0 SH 27 (SUTURE) ×2 IMPLANT
SUT VIC AB 2-0 CT2 27 (SUTURE) IMPLANT
SUT VIC AB 2-0 SH 27 (SUTURE)
SUT VIC AB 2-0 SH 27XBRD (SUTURE) IMPLANT
SUT VIC AB 3-0 SH 27 (SUTURE) ×2
SUT VIC AB 3-0 SH 27X BRD (SUTURE) ×1 IMPLANT
SUT VIC AB 4-0 PS2 18 (SUTURE) ×2 IMPLANT
SWAB OB GYN 8IN STERILE 2PK (MISCELLANEOUS) IMPLANT
SYR BULB IRRIG 60ML STRL (SYRINGE) ×2 IMPLANT
TOWEL OR 17X26 10 PK STRL BLUE (TOWEL DISPOSABLE) ×2 IMPLANT
WATER STERILE IRR 500ML POUR (IV SOLUTION) IMPLANT

## 2021-12-24 NOTE — Anesthesia Procedure Notes (Signed)
Procedure Name: LMA Insertion Date/Time: 12/24/2021 3:24 PM  Performed by: Clearnce Sorrel, CRNAPre-anesthesia Checklist: Patient identified, Emergency Drugs available, Suction available and Patient being monitored Patient Re-evaluated:Patient Re-evaluated prior to induction Oxygen Delivery Method: Circle System Utilized Preoxygenation: Pre-oxygenation with 100% oxygen Induction Type: IV induction Ventilation: Mask ventilation without difficulty LMA: LMA inserted LMA Size: 4.0 Number of attempts: 1 Airway Equipment and Method: Bite block Placement Confirmation: positive ETCO2 Tube secured with: Tape Dental Injury: Teeth and Oropharynx as per pre-operative assessment

## 2021-12-24 NOTE — Discharge Instructions (Addendum)
AFTER SURGERY INSTRUCTIONS   Return to work:  2-3 weeks if applicable, variable based on your occupation   We recommend purchasing several bags of frozen green peas and dividing them into ziploc bags. You will want to keep these in the freezer and have them ready to use as ice packs to the vulvar incision. Once the ice pack is no longer cold, you can get another from the freezer. The frozen peas mold to your body better than a regular ice pack.    Activity: 1. Be up and out of the bed during the day.  Take a nap if needed.  You may walk up steps but be careful and use the hand rail.  Stair climbing will tire you more than you think, you may need to stop part way and rest.    2. No lifting or straining for 4 weeks over 10 pounds. No pushing, pulling, straining for 4 weeks.   3. No driving for minimum 24 hours after surgery.  Do not drive if you are taking narcotic pain medicine and make sure that your reaction time has returned.    4. You can shower as soon as the next day after surgery. Shower daily. No tub baths or submerging your body in water until cleared by your surgeon. If you have the soap that was given to you by pre-surgical testing that was used before surgery, you do not need to use it afterwards because this can irritate your incisions.    5. No sexual activity and nothing in the vagina for 4 weeks.   6. You may experience vaginal spotting and discharge after surgery.  The spotting is normal but if you experience heavy bleeding, call our office.   7. Take Tylenol or ibuprofen first for pain and only use narcotic pain medication for severe pain not relieved by the Tylenol or Ibuprofen.  Monitor your Tylenol intake to a max of 4,000 mg in a 24 hour period. You can alternate these medications after surgery.   Diet: 1. Low sodium Heart Healthy Diet is recommended but you are cleared to resume your normal (before surgery) diet after your procedure.   2. It is safe to use a laxative,  such as Miralax or Colace, if you have difficulty moving your bowels. Plan to take Miralax for the first several days after surgery to prevent constipation. Wound Care: 1. Keep clean and dry.  Shower daily.   Reasons to call the Doctor: Fever - Oral temperature greater than 100.4 degrees Fahrenheit Foul-smelling vaginal discharge Difficulty urinating Nausea and vomiting Increased pain at the site of the incision that is unrelieved with pain medicine. Difficulty breathing with or without chest pain New calf pain especially if only on one side Sudden, continuing increased vaginal bleeding with or without clots.   Contacts: For questions or concerns you should contact:   Dr. Jeral Pinch at [phone]   Joylene John, NP at [phone]   After Hours: call [phone] and have the GYN Oncologist paged/contacted (after 5 pm or on the weekends).   Messages sent via mychart are for non-urgent matters and are not responded to after hours so for urgent needs, please call the after hours number.    Post Anesthesia Home Care Instructions  Activity: Get plenty of rest for the remainder of the day. A responsible individual must stay with you for 24 hours following the procedure.  For the next 24 hours, DO NOT: -Drive a car -Paediatric nurse -Drink alcoholic beverages -Take any  medication unless instructed by your physician -Make any legal decisions or sign important papers.  Meals: Start with liquid foods such as gelatin or soup. Progress to regular foods as tolerated. Avoid greasy, spicy, heavy foods. If nausea and/or vomiting occur, drink only clear liquids until the nausea and/or vomiting subsides. Call your physician if vomiting continues.  Special Instructions/Symptoms: Your throat may feel dry or sore from the anesthesia or the breathing tube placed in your throat during surgery. If this causes discomfort, gargle with warm salt water. The discomfort should disappear within  24 hours.       Tylenol may be taken after 7:15 pm if needed Ibuprofen, aleve, advil , motrin, toradol or any nsaids may be taken after 10 pm if needed

## 2021-12-24 NOTE — Anesthesia Preprocedure Evaluation (Signed)
Anesthesia Evaluation  Patient identified by MRN, date of birth, ID band Patient awake    Reviewed: Allergy & Precautions, NPO status , Patient's Chart, lab work & pertinent test results  History of Anesthesia Complications Negative for: history of anesthetic complications  Airway Mallampati: IV  TM Distance: >3 FB Neck ROM: Full    Dental  (+) Dental Advisory Given, Teeth Intact   Pulmonary asthma , sleep apnea and Continuous Positive Airway Pressure Ventilation ,    Pulmonary exam normal        Cardiovascular hypertension, Pt. on medications Normal cardiovascular exam     Neuro/Psych negative neurological ROS     GI/Hepatic Neg liver ROS, GERD  ,  Endo/Other  diabetes, Oral Hypoglycemic AgentsHypothyroidism   Renal/GU negative Renal ROS   Vulvar melanoma    Musculoskeletal negative musculoskeletal ROS (+)   Abdominal   Peds  Hematology negative hematology ROS (+)   Anesthesia Other Findings   Reproductive/Obstetrics                            Anesthesia Physical Anesthesia Plan  ASA: 3  Anesthesia Plan: General   Post-op Pain Management: Tylenol PO (pre-op)* and Toradol IV (intra-op)*   Induction: Intravenous  PONV Risk Score and Plan: 3 and Ondansetron, Dexamethasone, Midazolam and Treatment may vary due to age or medical condition  Airway Management Planned: LMA  Additional Equipment: None  Intra-op Plan:   Post-operative Plan: Extubation in OR  Informed Consent: I have reviewed the patients History and Physical, chart, labs and discussed the procedure including the risks, benefits and alternatives for the proposed anesthesia with the patient or authorized representative who has indicated his/her understanding and acceptance.     Dental advisory given  Plan Discussed with:   Anesthesia Plan Comments:         Anesthesia Quick Evaluation

## 2021-12-24 NOTE — Transfer of Care (Signed)
Immediate Anesthesia Transfer of Care Note  Patient: [name]  Procedure(s) Performed: WIDE EXCISION VULVECTOMY (Vagina )  Patient Location: PACU  Anesthesia Type:General  Level of Consciousness: awake, alert  and oriented  Airway & Oxygen Therapy: Patient Spontanous Breathing  Post-op Assessment: Report given to RN and Post -op Vital signs reviewed and stable  Post vital signs: Reviewed and stable  Last Vitals:  Vitals Value Taken Time  BP 152/72 12/24/21 1621  Temp    Pulse 74 12/24/21 1623  Resp 14 12/24/21 1623  SpO2 95 % 12/24/21 1623  Vitals shown include unvalidated device data.  Last Pain:  Vitals:   12/24/21 1257  TempSrc: Oral  PainSc: 0-No pain      Patients Stated Pain Goal: 5 (15/17/61 6073)  Complications: No notable events documented.

## 2021-12-24 NOTE — Op Note (Signed)
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 No definitive evidence visually of melanoma lesion. Lesions resected just over 2cm x 1.5cm.  Procedure: The patient was identified in the preoperative holding area. Informed consent was signed on the chart. Patient was seen history was reviewed and exam was performed.   The patient was then taken to the operating room and placed in the supine position with SCD hose on. General anesthesia was then induced without difficulty. She was then placed in the dorsolithotomy position. The perineum was prepped with Betadine. The vagina was prepped with Betadine. The patient was then draped after the prep was dried. A Foley catheter was inserted into the bladder under sterile conditions.  Timeout was performed the patient, procedure, antibiotic, allergy, and length of procedure. The prior scar was identified and the marking pen was used to circumscribe the area with appropriate surgical margins, getting as close to but not injuring the clitoris. The subcuticular tissues were infiltrated with 1% lidocaine. The 15 blade scalpel was used to make an incision through the skin circumferentially as marked. The skin elipse was grasped and was separated from the underlying deep dermal tissues with the bovie device. After the specimen had been completely resected, it was oriented and marked at 12 o'clock with a 0-vicryl suture. The bovie was used to obtain hemostasis at the surgical  bed. The subcutaneous tissues were irrigated and made hemostatic.   The deep dermal layer was approximated with 3-0vicryl mattress sutures to bring the skin edges into approximation and off tension. The wound was closed following langher's lines. The cutaneous layer was closed with interrupted 4-0 vicryl stitches and mattress sutures to ensure a tension free and hemostatic closure. After closure, bleeding was noted from two aspects of the incision. The stitches were removed, hemostasis achieved, and the incision was closed again in the same manner. The perineum was again irrigated.  All instrument, suture, laparotomy, Ray-Tec, and needle counts were correct x2. The patient tolerated the procedure well and was taken recovery room in stable condition.  Jeral Pinch MD Gynecologic Oncology

## 2021-12-24 NOTE — Anesthesia Postprocedure Evaluation (Signed)
Anesthesia Post Note  Patient: [name]  Procedure(s) Performed: WIDE EXCISION VULVECTOMY (Vagina )     Patient location during evaluation: PACU Anesthesia Type: General Level of consciousness: awake and alert Pain management: pain level controlled Vital Signs Assessment: post-procedure vital signs reviewed and stable Respiratory status: spontaneous breathing, nonlabored ventilation and respiratory function stable Cardiovascular status: blood pressure returned to baseline and stable Postop Assessment: no apparent nausea or vomiting Anesthetic complications: no   No notable events documented.  Last Vitals:  Vitals:   12/24/21 1623 12/24/21 1630  BP: (!) 152/72 (!) 156/69  Pulse: 74 71  Resp: 14 13  Temp: (!) 36.4 C   SpO2: 95% 91%    Last Pain:  Vitals:   12/24/21 1623  TempSrc:   PainSc: 0-No pain                 Lidia Collum

## 2021-12-24 NOTE — Interval H&P Note (Signed)
History and Physical Interval Note:  12/24/2021 12:29 PM  [name]  has presented today for surgery, with the diagnosis of VULVAR MELANOMA.  The various methods of treatment have been discussed with the patient and family. After consideration of risks, benefits and other options for treatment, the patient has consented to  Procedure(s): WIDE EXCISION VULVECTOMY (N/A) as a surgical intervention.  The patient's history has been reviewed, patient examined, no change in status, stable for surgery.  I have reviewed the patient's chart and labs.  Questions were answered to the patient's satisfaction.     [name]

## 2021-12-25 ENCOUNTER — Telehealth: Payer: Self-pay

## 2021-12-25 ENCOUNTER — Encounter (HOSPITAL_BASED_OUTPATIENT_CLINIC_OR_DEPARTMENT_OTHER): Payer: Self-pay | Admitting: Gynecologic Oncology

## 2021-12-25 NOTE — Telephone Encounter (Signed)
Spoke with [name] this morning. She states she is eating, drinking and urinating well. She is using her peri-bottle and applying frozen peas as needed. She has not had a BM yet but is passing gas. She is taking miralax and encouraged her to drink plenty of water. She denies fever or chills. She rates her pain 1-2/10. Her pain is controlled with tylenol.    Instructed to call office with any fever, chills, purulent drainage, uncontrolled pain or any other questions or concerns. Patient verbalizes understanding.   Pt aware of post op appointments as well as the office number [phone] and after hours number [phone] to call if she has any questions or concerns

## 2021-12-30 LAB — SURGICAL PATHOLOGY

## 2021-12-31 ENCOUNTER — Telehealth: Payer: Self-pay | Admitting: Gynecologic Oncology

## 2021-12-31 NOTE — Telephone Encounter (Signed)
Called patient to discuss recent pathology - repeat excision shows melanoma in situ, no invasive cancer. Margins unfortunately positives in 3 of 4 quadrants.   Discussed options including re-excision (and planning for more of a dermatology excision procedure with sending for frozen section to assure negative margins) versus Imiquimod treatment (with biopsies to prove response after). If we pursue re-excision, this will mean excision of the clitoris given proximity of clitoris re-excision last week.   Will plan to discuss further at her inperson follow-up visit.  Jeral Pinch MD Gynecologic Oncology

## 2022-01-01 ENCOUNTER — Telehealth: Payer: Self-pay | Admitting: Gynecologic Oncology

## 2022-01-01 NOTE — Telephone Encounter (Signed)
Called patient to discuss conversations I've now had with Dr. Cristina Gong and Dr. Barry Dienes.  Given death of melanocytes with freezing, doing frozen section is not possible. Pathologist suggested using a Woods lamp to see if there is anything visible on the skin that might facilitate identifying where melanoma in situ is still involved. I will have my office reach out to local dermatology office to see if they have a Woods lamp. If we opt to treat with Imiquimod, pathologist recommends waiting at least 3 weeks post treatment prior to taking repeat biopsies. Also stressed importance of making sure biopsies get down to bottom of hair follicles.  Theresa Pinch MD Gynecologic Oncology

## 2022-01-07 DIAGNOSIS — G4733 Obstructive sleep apnea (adult) (pediatric): Secondary | ICD-10-CM | POA: Diagnosis not present

## 2022-01-17 DIAGNOSIS — G4733 Obstructive sleep apnea (adult) (pediatric): Secondary | ICD-10-CM | POA: Diagnosis not present

## 2022-01-19 ENCOUNTER — Other Ambulatory Visit: Payer: Self-pay | Admitting: Obstetrics and Gynecology

## 2022-01-19 NOTE — Telephone Encounter (Signed)
Last annual exam 07/2021 

## 2022-01-21 ENCOUNTER — Encounter: Payer: Self-pay | Admitting: Gynecologic Oncology

## 2022-01-22 ENCOUNTER — Ambulatory Visit: Payer: PPO | Admitting: Allergy

## 2022-01-22 ENCOUNTER — Encounter: Payer: Self-pay | Admitting: Allergy

## 2022-01-22 VITALS — BP 140/68 | HR 67 | Temp 98.1°F | Resp 20 | Wt 226.0 lb

## 2022-01-22 DIAGNOSIS — J310 Chronic rhinitis: Secondary | ICD-10-CM

## 2022-01-22 DIAGNOSIS — E039 Hypothyroidism, unspecified: Secondary | ICD-10-CM | POA: Insufficient documentation

## 2022-01-22 DIAGNOSIS — N3281 Overactive bladder: Secondary | ICD-10-CM | POA: Insufficient documentation

## 2022-01-22 DIAGNOSIS — G4700 Insomnia, unspecified: Secondary | ICD-10-CM | POA: Insufficient documentation

## 2022-01-22 DIAGNOSIS — J4530 Mild persistent asthma, uncomplicated: Secondary | ICD-10-CM

## 2022-01-22 DIAGNOSIS — D179 Benign lipomatous neoplasm, unspecified: Secondary | ICD-10-CM | POA: Insufficient documentation

## 2022-01-22 DIAGNOSIS — F329 Major depressive disorder, single episode, unspecified: Secondary | ICD-10-CM | POA: Insufficient documentation

## 2022-01-22 DIAGNOSIS — J301 Allergic rhinitis due to pollen: Secondary | ICD-10-CM | POA: Insufficient documentation

## 2022-01-22 DIAGNOSIS — E119 Type 2 diabetes mellitus without complications: Secondary | ICD-10-CM | POA: Insufficient documentation

## 2022-01-22 MED ORDER — MONTELUKAST SODIUM 10 MG PO TABS: 10.0000 mg | ORAL_TABLET | Freq: Every day | ORAL | 5 refills | Status: DC

## 2022-01-22 MED ORDER — FLUTICASONE PROPIONATE HFA 44 MCG/ACT IN AERO: 1.0000 | INHALATION_SPRAY | Freq: Every day | RESPIRATORY_TRACT | 5 refills | Status: DC

## 2022-01-22 NOTE — Progress Notes (Signed)
Follow-up Note  RE: [name] MRN: [id number] DOB: 1951/06/04 Date of Office Visit: 01/22/2022   History of present illness: [name] is a 71 y.o. female presenting today for follow-up of asthma and mixed rhinitis.  She was last seen in the office on 07/24/21 by myself.  After this last visit she has had bronchitis twice and albuterol use needs and did increase flovent 156mg dosing to 2 puffs twice a day for about a month.  She was able over last 2 months to go back down on her flovent 44m to 1 puff daily.  She states she did go to PCP with symptoms and did receive an antibiotic with the first episode and with the second episode she did get steroid.  She continues on singulair daily. She also takes xyzal daily for her allergy symptom control.  She has not been needing any nasal sprays at this time.  She has not had any significant increase in asthma or allergy symptoms this spring or summer with the decrease in the air quality.  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  She had sentinel LN excised and will have another surgery where the cancer was excised however there were not clear margins.  She states it is a rare aggressive cancer but was found at an early stage of Ib.  She is following with an GYN oncologist.  Due to the diagnosis she did have a PET scan that did show some small lymph nodes that are going to be closely watched.  She will be having a CT scan done in the upcoming month to follow this.  Review of systems: Review of Systems  Constitutional: Negative.   HENT: Negative.    Eyes: Negative.   Respiratory: Negative.    Cardiovascular: Negative.   Gastrointestinal: Negative.   Musculoskeletal: Negative.   Skin: Negative.   Allergic/Immunologic: Negative.   Neurological: Negative.      All other systems negative unless noted above in HPI  Past medical/social/surgical/family history have been reviewed  and are unchanged unless specifically indicated below.  No changes  Medication List: Current Outpatient Medications  Medication Sig Dispense Refill   5-Hydroxytryptophan 200 MG CAPS Take by mouth.     acetaminophen (TYLENOL) 500 MG tablet Take by mouth.     albuterol (VENTOLIN HFA) 108 (90 Base) MCG/ACT inhaler      amLODipine (NORVASC) 5 MG tablet Take 5 mg by mouth daily.     azelastine (ASTELIN) 0.1 % nasal spray Place 2 sprays into both nostrils 2 (two) times daily. Use in each nostril as directed 60 mL 5   baclofen (LIORESAL) 20 MG tablet Take 20 mg by mouth 3 (three) times daily as needed.     CINNAMON PO 2 capsules Orally once a day     famotidine-calcium carbonate-magnesium hydroxide (PEPCID COMPLETE) 10-800-165 MG chewable tablet 1 tablet Orally Twice a day     fluticasone (FLONASE) 50 MCG/ACT nasal spray Place into both nostrils daily.     fluticasone (FLOVENT HFA) 44 MCG/ACT inhaler Inhale 1 puff into the lungs daily. 1 each 5   gabapentin (NEURONTIN) 400 MG capsule Take 1 capsule by mouth 4 (four) times daily.     GLUCOSAMINE-CHONDROITIN PO Take 1 tablet by mouth daily. Move Free Ultra Triple Action     glucose blood test strip as directed finger stick once a day for 90 days     hydrochlorothiazide (  MICROZIDE) 12.5 MG capsule 1 1/2     levocetirizine (XYZAL) 5 MG tablet TAKE 1 TABLET BY MOUTH EVERY EVENING 90 tablet 1   levothyroxine (SYNTHROID) 112 MCG tablet 1 tablet on an empty stomach in the morning     losartan (COZAAR) 100 MG tablet Take 100 mg by mouth daily.     Melatonin 3 MG TABS Take 3 mg by mouth as needed.     metFORMIN (GLUCOPHAGE-XR) 500 MG 24 hr tablet 1 tablet with evening meal Orally Once a day for 90 days     montelukast (SINGULAIR) 10 MG tablet Take 1 tablet (10 mg total) by mouth at bedtime. 30 tablet 5   Multiple Vitamin (MULTIVITAMIN WITH MINERALS) TABS tablet Take 1 tablet by mouth at bedtime.     MYRBETRIQ 50 MG TB24 tablet Take 50 mg by mouth daily.      Semaglutide, 2 MG/DOSE, (OZEMPIC, 2 MG/DOSE,) 8 MG/3ML SOPN Inject 2 mg into the skin once a week.     sertraline (ZOLOFT) 50 MG tablet Take 50 mg by mouth daily.     simvastatin (ZOCOR) 20 MG tablet Take 20 mg by mouth at bedtime.      zolpidem (AMBIEN) 10 MG tablet Take 5 mg by mouth at bedtime as needed for sleep.      No current facility-administered medications for this visit.     Known medication allergies: Allergies  Allergen Reactions   Oxycodone-Acetaminophen Nausea Only     Physical examination: Blood pressure 140/68, pulse 67, temperature 98.1 F (36.7 C), temperature source Temporal, resp. rate 20, weight 226 lb (102.5 kg), last menstrual period 07/06/1998, SpO2 96 %.  General: Alert, interactive, in no acute distress. HEENT: PERRLA, TMs pearly gray, turbinates non-edematous without discharge, post-pharynx non erythematous. Neck: Supple without lymphadenopathy. Lungs: Clear to auscultation without wheezing, rhonchi or rales. {no increased work of breathing. CV: Normal S1, S2 without murmurs. Abdomen: Nondistended, nontender. Skin: Warm and dry, without lesions or rashes. Extremities:  No clubbing, cyanosis or edema. Neuro:   Grossly intact.  Diagnositics/Labs:  Spirometry: FEV1: 2.18L 91%, FVC: 2.49L 79% predicted.  Nonobstructive pattern   Assessment and plan:   Asthma  - continue Flovent 44 g  1 puff once a day at this time.  Can try off Flovent once the air quality is better.   - asthma action plan if having asthma flare: take Flovent 2 puffs twice a day   - Continue Singulair 10 mg daily  - have access to albuterol inhaler 2 puffs every 4-6 hours as needed for cough/wheeze/shortness of breath/chest tightness.  May use 15-20 minutes prior to activity.   Monitor frequency of use.    Asthma control goals:  Full participation in all desired activities (may need albuterol before activity) Albuterol use two time or less a week on average (not counting use  with activity) Cough interfering with sleep two time or less a month Oral steroids no more than once a year No hospitalizations    Mixed rhinitis  - Continue Singulair as above  - Can try to wean off  Xyzal by trying every other day for about a week.  If not noting increase in allergy symptoms on off days then the 2nd week take twice a week for one week.  If no increase in symptoms after these 2 weeks then can use as needed.     - Continue Flonase 1-2 sprays each nostril daily as needed for congestion.  Use for 1-2 weeks at a  time before stopping once symptoms improved.   - Continue Astelin 1-2 sprays 1-2 times a day as needed for nasal drainage/post-nasal drip   Follow-up in 6 months or sooner if needed  I appreciate the opportunity to take part in [place]'s care. Please do not hesitate to contact me with questions.  Sincerely,   Prudy Feeler, MD Allergy/Immunology [facility]

## 2022-01-22 NOTE — Patient Instructions (Addendum)
Asthma  - continue Flovent 44 g  1 puff once a day at this time.  Can try off Flovent once the air quality is better.   - asthma action plan if having asthma flare: take Flovent 2 puffs twice a day   - Continue Singulair 10 mg daily  - have access to albuterol inhaler 2 puffs every 4-6 hours as needed for cough/wheeze/shortness of breath/chest tightness.  May use 15-20 minutes prior to activity.   Monitor frequency of use.    Asthma control goals:  Full participation in all desired activities (may need albuterol before activity) Albuterol use two time or less a week on average (not counting use with activity) Cough interfering with sleep two time or less a month Oral steroids no more than once a year No hospitalizations    Mixed rhinitis  - Continue Singulair as above  - Can try to wean off  Xyzal by trying every other day for about a week.  If not noting increase in allergy symptoms on off days then the 2nd week take twice a week for one week.  If no increase in symptoms after these 2 weeks then can use as needed.     - Continue Flonase 1-2 sprays each nostril daily as needed for congestion.  Use for 1-2 weeks at a time before stopping once symptoms improved.   - Continue Astelin 1-2 sprays 1-2 times a day as needed for nasal drainage/post-nasal drip   Follow-up in 6 months or sooner if needed

## 2022-01-23 ENCOUNTER — Other Ambulatory Visit: Payer: Self-pay | Admitting: Allergy

## 2022-01-26 ENCOUNTER — Encounter: Payer: Self-pay | Admitting: Gynecologic Oncology

## 2022-01-26 ENCOUNTER — Other Ambulatory Visit: Payer: Self-pay

## 2022-01-26 ENCOUNTER — Inpatient Hospital Stay: Payer: PPO | Attending: Gynecologic Oncology | Admitting: Gynecologic Oncology

## 2022-01-26 ENCOUNTER — Inpatient Hospital Stay: Payer: PPO

## 2022-01-26 VITALS — BP 156/67 | HR 64 | Temp 98.4°F | Resp 20 | Ht 66.0 in | Wt 216.0 lb

## 2022-01-26 DIAGNOSIS — D039 Melanoma in situ, unspecified: Secondary | ICD-10-CM

## 2022-01-26 DIAGNOSIS — Z7189 Other specified counseling: Secondary | ICD-10-CM

## 2022-01-26 DIAGNOSIS — C519 Malignant neoplasm of vulva, unspecified: Secondary | ICD-10-CM

## 2022-01-26 DIAGNOSIS — Z9079 Acquired absence of other genital organ(s): Secondary | ICD-10-CM

## 2022-01-26 MED ORDER — IMIQUIMOD 5 % EX CREA: TOPICAL_CREAM | CUTANEOUS | 2 refills | Status: DC

## 2022-01-26 NOTE — Progress Notes (Signed)
No visible lesions with Woods lamp or vulvoscopy Suture line opened laong inferior aspect Some mild hyperpigmentation along this aspect of the incision   Gynecologic Oncology Return Clinic Visit  01/26/2022  Reason for Visit: Postoperative follow-up, treatment planning  Treatment History: 08/12/21: Vulvar biopsy revealed vulvar melanoma  09/12/21: Mild hypermetabolism in the lower vulva likely related to recent biopsy/surgery.  No discrete hypermetabolic mass.  No adenopathy.  11/04/2021: Left vulvar wide local excision and left inguinal sentinel lymph node biopsy with Surgical Oncology. Operative Findings: 1 cm melanotic lesion on medial aspect of anterior left labia minora. 1 sentinel lymph node identified in the subcutaneous fat superficial to the external oblique.  Pathology:  Diagnosis  A: Sentinel lymph node, left inguinal, removal - Negative for melanoma in one lymph node (0/1). B: Vulva, excision - Malignant melanoma. Melanoma in situ extends broadly to 11-12-10 o'clock peripheral margin and focally to the 3 o'clock peripheral margin. Margins are negative for invasive melanoma. Type: Vulvar Clark Level: IV Breslow thickness: 1.8 mm Growth phase: Vertical, spindled melanocytes with light pigment Mitoses: per square millimeter Nuclear grade: 2/3 Tumor infiltrating lymphocytes:  Regression: Not identified Ulceration: Not identified  Microscopic satellite: Not identified Vascular invasion: Not identified Perineural invasion: Not identified Associated nevus: Not identified  Margins: Melanoma in situ extends broadly to 11-12-10 o'clock peripheral margin (B3-B5) and focally to the 3 o'clock peripheral margin (B3). Margins are negative for invasive melanoma Pathologic (pT, AJCC 8th edition) staging: pT2a C: Vulva, deep margin, excision - Connective tissue.  - Negative for melanoma  11/19/2021 Tumor Board recommendations.  Stage: IB cutaneous vulvar melanoma, Breslow thickness 1.8 mm,  BRAF negative, peripheral margins positive for melanoma in situ (11-12-10 o'clock) but negative for carcinoma. Plan: Repeat excision of vulvar melanoma in situ  - Medical Oncology referral for consideration of additional treatment (observation vs pembro vs locoregional radiation), scheduled 5/22 Medical oncology recommendations: CT C/A/P q 3 months for 1 year then q 6 months until year 3, then annual. Consider imiquimod for the in situ component if the margins on reexcision were to show melanoma in situ. Obtain full molecular profile with [employer] testing - testing showed no targetable mutations or obvious germline mutation.  12/24/2021: Partial simple left anterior vulvectomy given margins positive for vulvar melanoma in situ.  Prior scar identified, no definitive evidence visually of melanoma lesion.  mL in situ noted involving all margins.  No residual invasive disease noted.  Interval History: Patient reports overall doing well.  She denies any bleeding or discharge.  She endorses baseline bowel and bladder function.  She is noted migraine intermittently and a slight headache on and off for the last week or so.  This is in the setting of known rhinitis although not feeling that she has much related to sinus symptoms otherwise.  Past Medical/Surgical History: Past Medical History:  Diagnosis Date   Asthma    has rescue inhaler, now on Flovent   Chronic back pain    Diabetes mellitus without complication ([place])    on medformin, on Ozempic   GERD (gastroesophageal reflux disease)    History of COVID-19 04/05/2021   Hyperlipemia    Hypertension    Hypothyroidism    Insomnia    OAB (overactive bladder)    Seasonal allergies    Sleep apnea    uses a cpap    Past Surgical History:  Procedure Laterality Date   ANKLE ARTHROTOMY  2003   left-fx   APPENDECTOMY     back fusion  02/14/2019   L4 back fusion   BACK SURGERY  2010   lumb disk   BREAST LUMPECTOMY WITH RADIOACTIVE SEED  LOCALIZATION Left 06/13/2019   Procedure: LEFT BREAST LUMPECTOMY WITH RADIOACTIVE SEED LOCALIZATION;  Surgeon: Erroll Luna, MD;  Location: [facility];  Service: General;  Laterality: Left;   CARPAL TUNNEL RELEASE     rt   CARPAL TUNNEL RELEASE Left 08/01/2013   Procedure: LEFT CARPAL TUNNEL RELEASE;  Surgeon: Wynonia Sours, MD;  Location: [facility];  Service: Orthopedics;  Laterality: Left;   CESAREAN SECTION  83,86   COLONOSCOPY     DILATATION & CURETTAGE/HYSTEROSCOPY WITH MYOSURE N/A 08/23/2018   Procedure: DILATATION & CURETTAGE/HYSTEROSCOPY WITH MYOSURE POLYPECTOMY;  Surgeon: Nunzio Cobbs, MD;  Location: [facility];  Service: Gynecology;  Laterality: N/A;  fibroid resection   DILATION AND CURETTAGE OF UTERUS     x2   MASS EXCISION N/A 08/04/2017   Procedure: EXCISION LIPOMA UPPER BACK x2 AND NECK;  Surgeon: Erroll Luna, MD;  Location: [facility];  Service: General;  Laterality: N/A;   NASAL SEPTOPLASTY W/ TURBINOPLASTY Bilateral 06/21/2015   Procedure: NASAL SEPTOPLASTY WITH BILATERAL TURBINATE REDUCTION;  Surgeon: Jerrell Belfast, MD;  Location: [facility];  Service: ENT;  Laterality: Bilateral;   SINOSCOPY     TONSILLECTOMY     TUBAL LIGATION     VULVECTOMY N/A 12/24/2021   Procedure: WIDE EXCISION VULVECTOMY;  Surgeon: [name], MD;  Location: [facility];  Service: Gynecology;  Laterality: N/A;    Family History  Problem Relation Age of Onset   Allergic rhinitis Father    Allergic rhinitis Sister    Asthma Sister    Endometrial cancer Sister 58   Multiple myeloma Sister    Cancer Maternal Grandmother    Asthma Paternal Aunt    Angioedema Neg Hx    Eczema Neg Hx    Immunodeficiency Neg Hx    Urticaria Neg Hx    Ovarian cancer Neg Hx    Breast cancer Neg Hx    Colon cancer Neg Hx    Prostate cancer Neg Hx    Pancreatic cancer Neg Hx     Social History   Socioeconomic  History   Marital status: Married    Spouse name: Not on file   Number of children: Not on file   Years of education: Not on file   Highest education level: Not on file  Occupational History   Occupation: retored  Tobacco Use   Smoking status: Never   Smokeless tobacco: Never  Vaping Use   Vaping Use: Never used  Substance and Sexual Activity   Alcohol use: Yes    Comment: 2 glasses of wine/month   Drug use: No   Sexual activity: Yes    Birth control/protection: Post-menopausal  Other Topics Concern   Not on file  Social History Narrative   Not on file   Social Determinants of Health   Financial Resource Strain: Not on file  Food Insecurity: Not on file  Transportation Needs: Not on file  Physical Activity: Not on file  Stress: Not on file  Social Connections: Not on file    Current Medications:  Current Outpatient Medications:    5-Hydroxytryptophan 200 MG CAPS, Take by mouth., Disp: , Rfl:    acetaminophen (TYLENOL) 500 MG tablet, Take by mouth., Disp: , Rfl:    albuterol (VENTOLIN HFA) 108 (90 Base) MCG/ACT inhaler, ,  Disp: , Rfl:    amLODipine (NORVASC) 5 MG tablet, Take 5 mg by mouth daily., Disp: , Rfl:    azelastine (ASTELIN) 0.1 % nasal spray, Place 2 sprays into both nostrils 2 (two) times daily. Use in each nostril as directed, Disp: 60 mL, Rfl: 5   baclofen (LIORESAL) 20 MG tablet, Take 20 mg by mouth 3 (three) times daily as needed., Disp: , Rfl:    CINNAMON PO, 2 capsules Orally once a day, Disp: , Rfl:    famotidine-calcium carbonate-magnesium hydroxide (PEPCID COMPLETE) [phone] MG chewable tablet, 1 tablet Orally Twice a day, Disp: , Rfl:    fluticasone (FLONASE) 50 MCG/ACT nasal spray, Place into both nostrils daily., Disp: , Rfl:    gabapentin (NEURONTIN) 400 MG capsule, Take 1 capsule by mouth 4 (four) times daily., Disp: , Rfl:    GLUCOSAMINE-CHONDROITIN PO, Take 1 tablet by mouth daily. Move Free Ultra Triple Action, Disp: , Rfl:    glucose blood  test strip, as directed finger stick once a day for 90 days, Disp: , Rfl:    hydrochlorothiazide (MICROZIDE) 12.5 MG capsule, 1 1/2, Disp: , Rfl:    imiquimod (ALDARA) 5 % cream, Apply topically 3 (three) times a week. Discuss with your provider specific instructions for use, Disp: 12 each, Rfl: 2   levocetirizine (XYZAL) 5 MG tablet, TAKE 1 TABLET BY MOUTH EVERY EVENING, Disp: 90 tablet, Rfl: 1   levothyroxine (SYNTHROID) 112 MCG tablet, 1 tablet on an empty stomach in the morning, Disp: , Rfl:    losartan (COZAAR) 100 MG tablet, Take 100 mg by mouth daily., Disp: , Rfl:    Melatonin 3 MG TABS, Take 3 mg by mouth as needed., Disp: , Rfl:    metFORMIN (GLUCOPHAGE-XR) 500 MG 24 hr tablet, 1 tablet with evening meal Orally Once a day for 90 days, Disp: , Rfl:    Multiple Vitamin (MULTIVITAMIN WITH MINERALS) TABS tablet, Take 1 tablet by mouth at bedtime., Disp: , Rfl:    MYRBETRIQ 50 MG TB24 tablet, Take 50 mg by mouth daily., Disp: , Rfl:    Semaglutide, 2 MG/DOSE, (OZEMPIC, 2 MG/DOSE,) 8 MG/3ML SOPN, Inject 2 mg into the skin once a week., Disp: , Rfl:    sertraline (ZOLOFT) 50 MG tablet, Take 50 mg by mouth daily., Disp: , Rfl:    simvastatin (ZOCOR) 20 MG tablet, Take 20 mg by mouth at bedtime. , Disp: , Rfl:    zolpidem (AMBIEN) 10 MG tablet, Take 5 mg by mouth at bedtime as needed for sleep. , Disp: , Rfl:    fluticasone (FLOVENT HFA) 44 MCG/ACT inhaler, TAKE 1 PUFF BY MOUTH EVERY DAY, Disp: 10.6 each, Rfl: 5   montelukast (SINGULAIR) 10 MG tablet, Take 1 tablet (10 mg total) by mouth at bedtime., Disp: 30 tablet, Rfl: 5  Review of Systems: Denies appetite changes, fevers, chills, fatigue, unexplained weight changes. Denies hearing loss, neck lumps or masses, mouth sores, ringing in ears or voice changes. Denies cough or wheezing.  Denies shortness of breath. Denies chest pain or palpitations. Denies leg swelling. Denies abdominal distention, pain, blood in stools, constipation, diarrhea,  nausea, vomiting, or early satiety. Denies pain with intercourse, dysuria, frequency, hematuria or incontinence. Denies hot flashes, pelvic pain, vaginal bleeding or vaginal discharge.   Denies joint pain, back pain or muscle pain/cramps. Denies itching, rash, or wounds. Denies dizziness, numbness or seizures. Denies swollen lymph nodes or glands, denies easy bruising or bleeding. Denies anxiety, depression, confusion, or decreased  concentration.  Physical Exam: BP (!) 156/67 (BP Location: Left Arm, Patient Position: Sitting) Comment: RN and MD notified  Pulse 64   Temp 98.4 F (36.9 C) (Oral)   Resp 20   Ht _0  (1.676 m)   Wt 216 lb (98 kg)   LMP 07/06/1998 (Approximate)   BMI 34.86 kg/m  General: Alert, oriented, no acute distress. HEENT: Normocephalic, atraumatic, sclera anicteric. Chest: Unlabored breathing on room air. Extremities: Grossly normal range of motion.  Warm, well perfused.  No edema bilaterally.  GU: Normal appearing external genitalia.  Recent resection is healing well, evidence of several sutures still present.  There appears to have been some opening of the inferior aspect of the incision although it has healed closed.  There is some mild skin discoloration of the inferior aspect of the incision that I suspect is related to ongoing healing. The healed wounds and surrounding vulva including the clitoris was examined using a Woods lamp as well as with magnification using the colposcope.  No obvious skin changes to indicate areas of melanoma in situ were found using either tool.  Laboratory & Radiologic Studies: A.   VULVA, LEFT ANTERIOR, EXCISION: RESIDUAL MELANOMA IN SITU,  INVOLVING INKED 3, 6, 9 O'CLOCK MARGINS, NO RESIDUAL INVASIVE DISEASE,  STAGE UNCHAGED FROM PRIOR PT2A MELANOMA, 1.4 MM (CASE [id number]) SEE  COMMENT   COMMENT: THE 6 O'CLOCK TIP IS INVOLVED WITH MELANOMA IN SITU, AND  MELANOMA IN SITU INVOLVES THE LOWER HALF OF THE SPECIMEN FROM 3 TO 9   O'CLOCK INCLUDING 6 O'CLOCK MARGIN. No residual invasive disease is  seen. A RE-EXCISION IS RECOMMENDED. MelanA was used to highlight the  melanoma.  Dr. Nira Retort is in agreement with the diagnosis.   B.   VULVA, SUPERIOR MARGIN, EXCISION: RESIDUAL MELANOMA IN SITU  INVOLVING INKED MARGINS, SEE COMMENT   COMMENT: RESIDUAL MELANOMA IN SITU INVOLVES BLACK INKED SUTURED MARGIN  AND CONTRALATERAL YELLOW INKED MARGIN. No invasive melanoma is seen.  MelanA is used to highlight the melanoma. A re-excision is recommended.  Dr. Nira Retort is in agreement with the diagnosis.   Assessment & Plan: [name] is a 71 y.o. woman with Stage IB vulvar melanoma who presents after recent repeat resection in an attempt to achieve negative margins.  Unfortunately, margins still positive for melanoma in situ.  Patient is doing very well from a postoperative standpoint and meeting milestones.  Exam was performed today using the Woods lamp and colposcope without any visual changes to help identify residual areas of melanoma in situ.  I have previously discussed with patient option of repeat excision versus trial of topical treatment using imiquimod.  There are data to support the use of imiquimod for both cutaneous melanoma as well as some cases of vulvar and vaginal melanoma when repeat resection is not feasible.  We will repeat resection is feasible in her case, given proximity of the last excision to her clitoris, repeat excision would require removal of the clitoris and clitoral hood.  I offered both options again today and the patient would like to proceed with trial of imiquimod.  I will have her come back in 2 or 3 weeks, once her incision has completely healed, to begin imiquimod treatment.  I sent a prescription for the imiquimod to her pharmacy.  I will plan to apply it with her watching as well as her husband when she comes back for her next visit.  We will likely plan on a total of 16 weeks of  therapy.  Per  my discussion with the pathologist here, recommendation was made to wait at least several weeks after treatment with imiquimod not before biopsying.  It would also be important to get biopsies deep enough to evaluate for MIS.   In the setting of her new headaches over the last week, we discussed possible causes including her sinus issues as well as the unlikely but possible case of metastatic disease related to her melanoma.  Blood pressure was mildly elevated in clinic today but it sounds like has been running closer to normal.  She is scheduled for a CT scan in early August, and we could add a CT of the head for this.  She is going to let me know if her symptoms do not resolve or improve over the next 1-2 weeks.  26 minutes of total time was spent for this patient encounter, including preparation, face-to-face counseling with the patient and coordination of care, and documentation of the encounter.  Jeral Pinch, MD  Division of Gynecologic Oncology  Department of Obstetrics and Gynecology  [facility]

## 2022-01-26 NOTE — Patient Instructions (Signed)
It was good to see you today.  You are healing well from surgery.  I will see you back in 2 or 3 weeks.  If your incision has completely healed, we will plan to apply the imiquimod here in clinic so that I can show you the area where I want you to use the cream.  We will discuss the schedule for how frequently to use it and our follow-up plan.  Please let me know if your headaches worsen or you develop any new symptoms.  If you are still having headaches when I see you next, we will plan to get a CT scan of your head.

## 2022-02-03 DIAGNOSIS — R519 Headache, unspecified: Secondary | ICD-10-CM | POA: Diagnosis not present

## 2022-02-03 DIAGNOSIS — I10 Essential (primary) hypertension: Secondary | ICD-10-CM | POA: Diagnosis not present

## 2022-02-09 ENCOUNTER — Ambulatory Visit (HOSPITAL_COMMUNITY)
Admission: RE | Admit: 2022-02-09 | Discharge: 2022-02-09 | Disposition: A | Discharge status: Home / Self Care | Payer: PPO | Source: Ambulatory Visit | Attending: Gynecologic Oncology | Admitting: Gynecologic Oncology

## 2022-02-09 DIAGNOSIS — C519 Malignant neoplasm of vulva, unspecified: Secondary | ICD-10-CM | POA: Insufficient documentation

## 2022-02-09 DIAGNOSIS — K8020 Calculus of gallbladder without cholecystitis without obstruction: Secondary | ICD-10-CM | POA: Diagnosis not present

## 2022-02-09 DIAGNOSIS — D259 Leiomyoma of uterus, unspecified: Secondary | ICD-10-CM | POA: Diagnosis not present

## 2022-02-09 DIAGNOSIS — R918 Other nonspecific abnormal finding of lung field: Secondary | ICD-10-CM | POA: Diagnosis not present

## 2022-02-09 DIAGNOSIS — C579 Malignant neoplasm of female genital organ, unspecified: Secondary | ICD-10-CM | POA: Diagnosis not present

## 2022-02-09 DIAGNOSIS — K769 Liver disease, unspecified: Secondary | ICD-10-CM | POA: Diagnosis not present

## 2022-02-09 DIAGNOSIS — I700 Atherosclerosis of aorta: Secondary | ICD-10-CM | POA: Diagnosis not present

## 2022-02-09 DIAGNOSIS — J9811 Atelectasis: Secondary | ICD-10-CM | POA: Diagnosis not present

## 2022-02-09 LAB — POCT I-STAT CREATININE: Creatinine, Ser: 0.9 mg/dL (ref 0.44–1.00)

## 2022-02-09 MED ORDER — IOHEXOL 300 MG/ML  SOLN
100.0000 mL | Freq: Once | INTRAMUSCULAR | Status: AC | PRN
  Administered 2022-02-09: 100 mL via INTRAVENOUS

## 2022-02-09 MED ORDER — SODIUM CHLORIDE (PF) 0.9 % IJ SOLN
INTRAMUSCULAR | Status: AC
  Filled 2022-02-09: qty 50

## 2022-02-10 ENCOUNTER — Telehealth: Payer: Self-pay | Admitting: Gynecologic Oncology

## 2022-02-10 NOTE — Telephone Encounter (Signed)
Called the patient to review CT results.  Discussed difference in PET scan and CT scan.  CT of the chest is able to characterize small lesions better than the PET scan.  Spoke to the reading radiologist who suspects that the nodule seen on recent chest CT were likely all there but just not all seen on the PET scan.  We are planning for close interval surveillance in 3 months given her melanoma diagnosis.  This will include a CT scan of her chest to assess for any change in these nodules.  Jeral Pinch MD Gynecologic Oncology

## 2022-02-17 DIAGNOSIS — G4733 Obstructive sleep apnea (adult) (pediatric): Secondary | ICD-10-CM | POA: Diagnosis not present

## 2022-02-18 ENCOUNTER — Encounter: Payer: Self-pay | Admitting: Gynecologic Oncology

## 2022-02-20 ENCOUNTER — Inpatient Hospital Stay: Payer: PPO | Attending: Gynecologic Oncology | Admitting: Gynecologic Oncology

## 2022-02-20 ENCOUNTER — Other Ambulatory Visit: Payer: Self-pay

## 2022-02-20 ENCOUNTER — Encounter: Payer: Self-pay | Admitting: Gynecologic Oncology

## 2022-02-20 VITALS — BP 148/62 | HR 74 | Temp 98.6°F | Resp 18 | Ht 66.0 in | Wt 225.0 lb

## 2022-02-20 DIAGNOSIS — Z9079 Acquired absence of other genital organ(s): Secondary | ICD-10-CM | POA: Insufficient documentation

## 2022-02-20 DIAGNOSIS — C519 Malignant neoplasm of vulva, unspecified: Secondary | ICD-10-CM | POA: Diagnosis not present

## 2022-02-20 NOTE — Progress Notes (Signed)
Gynecologic Oncology Return Clinic Visit  02/20/2022  Reason for Visit: [place] planning  Treatment History: 08/12/21: Vulvar biopsy revealed vulvar melanoma   09/12/21: Mild hypermetabolism in the lower vulva likely related to recent biopsy/surgery.  No discrete hypermetabolic mass.  No adenopathy.   11/04/2021: Left vulvar wide local excision and left inguinal sentinel lymph node biopsy with Surgical Oncology. Operative Findings: 1 cm melanotic lesion on medial aspect of anterior left labia minora. 1 sentinel lymph node identified in the subcutaneous fat superficial to the external oblique.  Pathology:  Diagnosis  A: Sentinel lymph node, left inguinal, removal - Negative for melanoma in one lymph node (0/1). B: Vulva, excision - Malignant melanoma. Melanoma in situ extends broadly to 11-12-10 o'clock peripheral margin and focally to the 3 o'clock peripheral margin. Margins are negative for invasive melanoma. Type: Vulvar Clark Level: IV Breslow thickness: 1.8 mm Growth phase: Vertical, spindled melanocytes with light pigment Mitoses: per square millimeter Nuclear grade: 2/3 Tumor infiltrating lymphocytes:  Regression: Not identified Ulceration: Not identified  Microscopic satellite: Not identified Vascular invasion: Not identified Perineural invasion: Not identified Associated nevus: Not identified  Margins: Melanoma in situ extends broadly to 11-12-10 o'clock peripheral margin (B3-B5) and focally to the 3 o'clock peripheral margin (B3). Margins are negative for invasive melanoma Pathologic (pT, AJCC 8th edition) staging: pT2a C: Vulva, deep margin, excision - Connective tissue.  - Negative for melanoma   11/19/2021 Tumor Board recommendations.  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 Plan: Repeat excision of vulvar melanoma in situ  - Medical Oncology  referral for consideration of additional treatment (observation vs pembro vs locoregional radiation), scheduled 5/22 Medical oncology recommendations: CT C/A/P q 3 months for 1 year then q 6 months until year 3, then annual. Consider imiquimod for the in situ component if the margins on reexcision were to show melanoma in situ. Obtain full molecular profile with Tempus testing - testing showed no targetable mutations or obvious germline mutation.   12/24/2021: Partial simple left anterior vulvectomy given margins positive for vulvar melanoma in situ.  Prior scar identified, no definitive evidence visually of melanoma lesion.  mL in situ noted involving all margins.  No residual invasive disease noted.   Interval History: Doing well.  Denies any pain, pruritus, bleeding, or discharge related to her incision.  Feels that she has healed completely.  Reports normal bowel and bladder function.  Past Medical/Surgical History: Past Medical History:  Diagnosis Date   Asthma    has rescue inhaler, now on Flovent   Chronic back pain    Diabetes mellitus without complication ([place])    on medformin, on Ozempic   GERD (gastroesophageal reflux disease)    History of COVID-19 04/05/2021   Hyperlipemia    Hypertension    Hypothyroidism    Insomnia    OAB (overactive bladder)    Seasonal allergies    Sleep apnea    uses a cpap    Past Surgical History:  Procedure Laterality Date   ANKLE ARTHROTOMY  2003   left-fx   APPENDECTOMY     back fusion  02/14/2019   L4 back fusion   BACK SURGERY  2010   lumb disk   BREAST LUMPECTOMY WITH RADIOACTIVE SEED LOCALIZATION Left 06/13/2019   Procedure: LEFT BREAST LUMPECTOMY WITH RADIOACTIVE SEED LOCALIZATION;  Surgeon: Erroll Luna, MD;  Location: [facility];  Service: General;  Laterality: Left;  CARPAL TUNNEL RELEASE     rt   CARPAL TUNNEL RELEASE Left 08/01/2013   Procedure: LEFT CARPAL TUNNEL RELEASE;  Surgeon: Wynonia Sours, MD;   Location: [facility];  Service: Orthopedics;  Laterality: Left;   CESAREAN SECTION  83,86   COLONOSCOPY     DILATATION & CURETTAGE/HYSTEROSCOPY WITH MYOSURE N/A 08/23/2018   Procedure: DILATATION & CURETTAGE/HYSTEROSCOPY WITH MYOSURE POLYPECTOMY;  Surgeon: Nunzio Cobbs, MD;  Location: [facility];  Service: Gynecology;  Laterality: N/A;  fibroid resection   DILATION AND CURETTAGE OF UTERUS     x2   MASS EXCISION N/A 08/04/2017   Procedure: EXCISION LIPOMA UPPER BACK x2 AND NECK;  Surgeon: Erroll Luna, MD;  Location: [facility];  Service: General;  Laterality: N/A;   NASAL SEPTOPLASTY W/ TURBINOPLASTY Bilateral 06/21/2015   Procedure: NASAL SEPTOPLASTY WITH BILATERAL TURBINATE REDUCTION;  Surgeon: Jerrell Belfast, MD;  Location: [facility];  Service: ENT;  Laterality: Bilateral;   SINOSCOPY     TONSILLECTOMY     TUBAL LIGATION     VULVECTOMY N/A 12/24/2021   Procedure: WIDE EXCISION VULVECTOMY;  Surgeon: [name], MD;  Location: [facility];  Service: Gynecology;  Laterality: N/A;    Family History  Problem Relation Age of Onset   Allergic rhinitis Father    Allergic rhinitis Sister    Asthma Sister    Endometrial cancer Sister 72   Multiple myeloma Sister    Cancer Maternal Grandmother    Asthma Paternal Aunt    Angioedema Neg Hx    Eczema Neg Hx    Immunodeficiency Neg Hx    Urticaria Neg Hx    Ovarian cancer Neg Hx    Breast cancer Neg Hx    Colon cancer Neg Hx    Prostate cancer Neg Hx    Pancreatic cancer Neg Hx     Social History   Socioeconomic History   Marital status: Married    Spouse name: Not on file   Number of children: Not on file   Years of education: Not on file   Highest education level: Not on file  Occupational History   Occupation: retored  Tobacco Use   Smoking status: Never   Smokeless tobacco: Never  Vaping Use   Vaping Use: Never used  Substance and Sexual  Activity   Alcohol use: Yes    Comment: 2 glasses of wine/month   Drug use: No   Sexual activity: Yes    Birth control/protection: Post-menopausal  Other Topics Concern   Not on file  Social History Narrative   Not on file   Social Determinants of Health   Financial Resource Strain: Not on file  Food Insecurity: Not on file  Transportation Needs: Not on file  Physical Activity: Not on file  Stress: Not on file  Social Connections: Not on file    Current Medications:  Current Outpatient Medications:    5-Hydroxytryptophan 200 MG CAPS, Take by mouth., Disp: , Rfl:    acetaminophen (TYLENOL) 500 MG tablet, Take by mouth., Disp: , Rfl:    albuterol (VENTOLIN HFA) 108 (90 Base) MCG/ACT inhaler, , Disp: , Rfl:    amLODipine (NORVASC) 5 MG tablet, Take 5 mg by mouth daily., Disp: , Rfl:    azelastine (ASTELIN) 0.1 % nasal spray, Place 2 sprays into both nostrils 2 (two) times daily. Use in each nostril as directed, Disp: 60 mL, Rfl: 5   baclofen (LIORESAL) 20  MG tablet, Take 20 mg by mouth 3 (three) times daily as needed., Disp: , Rfl:    CINNAMON PO, 2 capsules Orally once a day, Disp: , Rfl:    famotidine-calcium carbonate-magnesium hydroxide (PEPCID COMPLETE) 10-800-165 MG chewable tablet, 1 tablet Orally Twice a day, Disp: , Rfl:    fluticasone (FLONASE) 50 MCG/ACT nasal spray, Place into both nostrils daily., Disp: , Rfl:    fluticasone (FLOVENT HFA) 44 MCG/ACT inhaler, TAKE 1 PUFF BY MOUTH EVERY DAY, Disp: 10.6 each, Rfl: 5   gabapentin (NEURONTIN) 400 MG capsule, Take 1 capsule by mouth 4 (four) times daily., Disp: , Rfl:    GLUCOSAMINE-CHONDROITIN PO, Take 1 tablet by mouth daily. Move Free Ultra Triple Action, Disp: , Rfl:    glucose blood test strip, as directed finger stick once a day for 90 days, Disp: , Rfl:    hydrochlorothiazide (MICROZIDE) 12.5 MG capsule, 1 1/2, Disp: , Rfl:    levocetirizine (XYZAL) 5 MG tablet, TAKE 1 TABLET BY MOUTH EVERY EVENING, Disp: 90 tablet,  Rfl: 1   levothyroxine (SYNTHROID) 112 MCG tablet, 1 tablet on an empty stomach in the morning, Disp: , Rfl:    losartan (COZAAR) 100 MG tablet, Take 100 mg by mouth daily., Disp: , Rfl:    Melatonin 3 MG TABS, Take 3 mg by mouth as needed., Disp: , Rfl:    metFORMIN (GLUCOPHAGE-XR) 500 MG 24 hr tablet, 1 tablet with evening meal Orally Once a day for 90 days, Disp: , Rfl:    montelukast (SINGULAIR) 10 MG tablet, Take 1 tablet (10 mg total) by mouth at bedtime., Disp: 30 tablet, Rfl: 5   Multiple Vitamin (MULTIVITAMIN WITH MINERALS) TABS tablet, Take 1 tablet by mouth at bedtime., Disp: , Rfl:    MYRBETRIQ 50 MG TB24 tablet, Take 50 mg by mouth daily., Disp: , Rfl:    Semaglutide, 2 MG/DOSE, (OZEMPIC, 2 MG/DOSE,) 8 MG/3ML SOPN, Inject 2 mg into the skin once a week., Disp: , Rfl:    sertraline (ZOLOFT) 50 MG tablet, Take 50 mg by mouth daily., Disp: , Rfl:    simvastatin (ZOCOR) 20 MG tablet, Take 20 mg by mouth at bedtime. , Disp: , Rfl:    zolpidem (AMBIEN) 10 MG tablet, Take 5 mg by mouth at bedtime as needed for sleep. , Disp: , Rfl:    imiquimod (ALDARA) 5 % cream, Apply topically 3 (three) times a week. Discuss with your provider specific instructions for use (Patient not taking: Reported on 02/18/2022), Disp: 12 each, Rfl: 2  Review of Systems: Denies appetite changes, fevers, chills, fatigue, unexplained weight changes. Denies hearing loss, neck lumps or masses, mouth sores, ringing in ears or voice changes. Denies cough or wheezing.  Denies shortness of breath. Denies chest pain or palpitations. Denies leg swelling. Denies abdominal distention, pain, blood in stools, constipation, diarrhea, nausea, vomiting, or early satiety. Denies pain with intercourse, dysuria, frequency, hematuria or incontinence. Denies hot flashes, pelvic pain, vaginal bleeding or vaginal discharge.   Denies joint pain, back pain or muscle pain/cramps. Denies itching, rash, or wounds. Denies dizziness,  headaches, numbness or seizures. Denies swollen lymph nodes or glands, denies easy bruising or bleeding. Denies anxiety, depression, confusion, or decreased concentration.  Physical Exam: BP (!) 148/62 (BP Location: Left Arm, Patient Position: Sitting)   Pulse 74   Temp 98.6 F (37 C) (Oral)   Resp 18   Ht _0  (1.676 m)   Wt 225 lb (102.1 kg)   LMP 07/06/1998 (  Approximate)   SpO2 96%   BMI 36.32 kg/m  General: Alert, oriented, no acute distress. HEENT: Normocephalic, atraumatic, sclera anicteric. Chest: Unlabored breathing on room air. Extremities: Grossly normal range of motion.  Warm, well perfused.  No edema bilaterally.   GU: Normal appearing external genitalia.  Incision has healed well, very small, approximately 3 mm area of granulation tissue along the incision.  Area outlined with a surgical pen measuring approximately 2 x 1-1/2 cm for planned placement of Aldara cream.  Surrounding skin was treated with barrier cream and Aldara was applied to the circumscribed area.  After this was let to absorb some, a pad was placed to prevent spread to the contralateral labia.  Laboratory & Radiologic Studies: None new  Assessment & Plan: [name] is a 71 y.o. woman with Stage IB vulvar melanoma who underwent repeat excision in an attempt to achieve negative margins given MIS present. Unfortunately, margins still positive for melanoma in situ. Plan for treatment with Aldara.  Patient presents today for teaching of application of Aldara.  This was performed with husband watching and taking a video.  Discussed ways to limit irritation to surrounding skin and how to apply.  Plan for application 3 times a week for 1 month.  If tolerates this well, will increase to 5 times a week, Monday through Friday, for the remainder of the 16-week planned treatment course.  I will see her in a month to assess symptoms and exam.  I have asked her to call if she develops significant irritation.  In  this case, would dial back to treatment once a week or pause treatment for 1 week.  18 minutes of total time was spent for this patient encounter, including preparation, face-to-face counseling with the patient and coordination of care, and documentation of the encounter.  Jeral Pinch, MD  Division of Gynecologic Oncology  Department of Obstetrics and Gynecology  [facility]

## 2022-02-20 NOTE — Patient Instructions (Signed)
Please use the Aldara cream 3 times a week in the evening.  If you tolerate this well over the next 4 weeks, we will increase to 5 times a week at night.  It is normal to have some irritation of the area.  If you have significant pain and irritation, please call me.  In that case, we will either decrease treatment to once a week or stop altogether for a period of time.  I will see you in about a month to examine the area.

## 2022-02-23 DIAGNOSIS — D170 Benign lipomatous neoplasm of skin and subcutaneous tissue of head, face and neck: Secondary | ICD-10-CM | POA: Diagnosis not present

## 2022-02-23 DIAGNOSIS — G4700 Insomnia, unspecified: Secondary | ICD-10-CM | POA: Diagnosis not present

## 2022-02-23 DIAGNOSIS — E118 Type 2 diabetes mellitus with unspecified complications: Secondary | ICD-10-CM | POA: Diagnosis not present

## 2022-02-23 DIAGNOSIS — M7122 Synovial cyst of popliteal space [Baker], left knee: Secondary | ICD-10-CM | POA: Diagnosis not present

## 2022-02-23 DIAGNOSIS — G629 Polyneuropathy, unspecified: Secondary | ICD-10-CM | POA: Diagnosis not present

## 2022-02-23 DIAGNOSIS — I10 Essential (primary) hypertension: Secondary | ICD-10-CM | POA: Diagnosis not present

## 2022-02-23 DIAGNOSIS — C519 Malignant neoplasm of vulva, unspecified: Secondary | ICD-10-CM | POA: Diagnosis not present

## 2022-02-23 DIAGNOSIS — Z23 Encounter for immunization: Secondary | ICD-10-CM | POA: Diagnosis not present

## 2022-02-23 DIAGNOSIS — E119 Type 2 diabetes mellitus without complications: Secondary | ICD-10-CM | POA: Diagnosis not present

## 2022-02-23 DIAGNOSIS — E039 Hypothyroidism, unspecified: Secondary | ICD-10-CM | POA: Diagnosis not present

## 2022-02-24 DIAGNOSIS — N3946 Mixed incontinence: Secondary | ICD-10-CM | POA: Diagnosis not present

## 2022-02-24 DIAGNOSIS — R350 Frequency of micturition: Secondary | ICD-10-CM | POA: Diagnosis not present

## 2022-02-24 DIAGNOSIS — R351 Nocturia: Secondary | ICD-10-CM | POA: Diagnosis not present

## 2022-02-27 DIAGNOSIS — M4326 Fusion of spine, lumbar region: Secondary | ICD-10-CM | POA: Diagnosis not present

## 2022-02-27 DIAGNOSIS — M545 Low back pain, unspecified: Secondary | ICD-10-CM | POA: Diagnosis not present

## 2022-03-16 DIAGNOSIS — E782 Mixed hyperlipidemia: Secondary | ICD-10-CM | POA: Diagnosis not present

## 2022-03-16 DIAGNOSIS — I10 Essential (primary) hypertension: Secondary | ICD-10-CM | POA: Diagnosis not present

## 2022-03-16 DIAGNOSIS — E669 Obesity, unspecified: Secondary | ICD-10-CM | POA: Diagnosis not present

## 2022-03-16 DIAGNOSIS — G4733 Obstructive sleep apnea (adult) (pediatric): Secondary | ICD-10-CM | POA: Diagnosis not present

## 2022-03-16 DIAGNOSIS — F329 Major depressive disorder, single episode, unspecified: Secondary | ICD-10-CM | POA: Diagnosis not present

## 2022-03-17 DIAGNOSIS — L814 Other melanin hyperpigmentation: Secondary | ICD-10-CM | POA: Diagnosis not present

## 2022-03-17 DIAGNOSIS — D239 Other benign neoplasm of skin, unspecified: Secondary | ICD-10-CM | POA: Diagnosis not present

## 2022-03-17 DIAGNOSIS — L821 Other seborrheic keratosis: Secondary | ICD-10-CM | POA: Diagnosis not present

## 2022-03-17 DIAGNOSIS — C510 Malignant neoplasm of labium majus: Secondary | ICD-10-CM | POA: Diagnosis not present

## 2022-03-17 DIAGNOSIS — L570 Actinic keratosis: Secondary | ICD-10-CM | POA: Diagnosis not present

## 2022-03-17 DIAGNOSIS — D225 Melanocytic nevi of trunk: Secondary | ICD-10-CM | POA: Diagnosis not present

## 2022-03-17 DIAGNOSIS — L820 Inflamed seborrheic keratosis: Secondary | ICD-10-CM | POA: Diagnosis not present

## 2022-03-17 DIAGNOSIS — L578 Other skin changes due to chronic exposure to nonionizing radiation: Secondary | ICD-10-CM | POA: Diagnosis not present

## 2022-03-20 DIAGNOSIS — G4733 Obstructive sleep apnea (adult) (pediatric): Secondary | ICD-10-CM | POA: Diagnosis not present

## 2022-03-26 ENCOUNTER — Encounter: Payer: Self-pay | Admitting: Gynecologic Oncology

## 2022-03-26 ENCOUNTER — Inpatient Hospital Stay: Payer: PPO | Attending: Gynecologic Oncology | Admitting: Gynecologic Oncology

## 2022-03-26 VITALS — BP 153/76 | HR 69 | Temp 98.6°F | Resp 18 | Ht 65.98 in | Wt 227.0 lb

## 2022-03-26 DIAGNOSIS — C519 Malignant neoplasm of vulva, unspecified: Secondary | ICD-10-CM | POA: Diagnosis not present

## 2022-03-26 DIAGNOSIS — D038 Melanoma in situ of other sites: Secondary | ICD-10-CM

## 2022-03-26 DIAGNOSIS — Z79899 Other long term (current) drug therapy: Secondary | ICD-10-CM | POA: Diagnosis not present

## 2022-03-26 NOTE — Patient Instructions (Signed)
Please increase the cream to every night Monday through Friday.  We will have a phone visit in 1 month to check in about your symptoms.  I will see you back for an exam in 2 months.  We will tentatively plan for 12 more weeks of treatment plus 4 weeks off treatment before we repeat any biopsies.

## 2022-03-26 NOTE — Progress Notes (Signed)
Gynecologic Oncology Return Clinic Visit  03/26/22  Reason for Visit: follow-up after starting Aldara   Treatment History: 08/12/21: Vulvar biopsy revealed vulvar melanoma   09/12/21: Mild hypermetabolism in the lower vulva likely related to recent biopsy/surgery.  No discrete hypermetabolic mass.  No adenopathy.   11/04/2021: Left vulvar wide local excision and left inguinal sentinel lymph node biopsy with Surgical Oncology. Operative Findings: 1 cm melanotic lesion on medial aspect of anterior left labia minora. 1 sentinel lymph node identified in the subcutaneous fat superficial to the external oblique.  Pathology:  Diagnosis  A: Sentinel lymph node, left inguinal, removal - Negative for melanoma in one lymph node (0/1). B: Vulva, excision - Malignant melanoma. Melanoma in situ extends broadly to 11-12-10 o'clock peripheral margin and focally to the 3 o'clock peripheral margin. Margins are negative for invasive melanoma. Type: Vulvar Clark Level: IV Breslow thickness: 1.8 mm Growth phase: Vertical, spindled melanocytes with light pigment Mitoses: per square millimeter Nuclear grade: 2/3 Tumor infiltrating lymphocytes:  Regression: Not identified Ulceration: Not identified  Microscopic satellite: Not identified Vascular invasion: Not identified Perineural invasion: Not identified Associated nevus: Not identified  Margins: Melanoma in situ extends broadly to 11-12-10 o'clock peripheral margin (B3-B5) and focally to the 3 o'clock peripheral margin (B3). Margins are negative for invasive melanoma Pathologic (pT, AJCC 8th edition) staging: pT2a C: Vulva, deep margin, excision - Connective tissue.  - Negative for melanoma   11/19/2021 Tumor Board recommendations.  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 Plan: Repeat excision of vulvar melanoma in situ  - Medical  Oncology referral for consideration of additional treatment (observation vs pembro vs locoregional radiation), scheduled 5/22 Medical oncology recommendations: CT C/A/P q 3 months for 1 year then q 6 months until year 3, then annual. Consider imiquimod for the in situ component if the margins on reexcision were to show melanoma in situ. Obtain full molecular profile with Tempus testing - testing showed no targetable mutations or obvious germline mutation.   12/24/2021: Partial simple left anterior vulvectomy given margins positive for vulvar melanoma in situ.  Prior scar identified, no definitive evidence visually of melanoma lesion.  mL in situ noted involving all margins.  No residual invasive disease noted.  Interval History: Doing well.  Reports some irritation related to the Aldara but manageable.  Is using Vaseline as barrier around application.  Has had 1 episode of a spot of blood, otherwise denies any bleeding.  Past Medical/Surgical History: Past Medical History:  Diagnosis Date   Asthma    has rescue inhaler, now on Flovent   Chronic back pain    Diabetes mellitus without complication ([place])    on medformin, on Ozempic   GERD (gastroesophageal reflux disease)    History of COVID-19 04/05/2021   Hyperlipemia    Hypertension    Hypothyroidism    Insomnia    OAB (overactive bladder)    Seasonal allergies    Sleep apnea    uses a cpap    Past Surgical History:  Procedure Laterality Date   ANKLE ARTHROTOMY  2003   left-fx   APPENDECTOMY     back fusion  02/14/2019   L4 back fusion   BACK SURGERY  2010   lumb disk   BREAST LUMPECTOMY WITH RADIOACTIVE SEED LOCALIZATION Left 06/13/2019   Procedure: LEFT BREAST LUMPECTOMY WITH RADIOACTIVE SEED LOCALIZATION;  Surgeon: Erroll Luna, MD;  Location: [facility];  Service: General;  Laterality: Left;   CARPAL TUNNEL RELEASE     rt   CARPAL TUNNEL RELEASE Left 08/01/2013   Procedure: LEFT CARPAL TUNNEL RELEASE;   Surgeon: Wynonia Sours, MD;  Location: [place];  Service: Orthopedics;  Laterality: Left;   CESAREAN SECTION  83,86   COLONOSCOPY     DILATATION & CURETTAGE/HYSTEROSCOPY WITH MYOSURE N/A 08/23/2018   Procedure: DILATATION & CURETTAGE/HYSTEROSCOPY WITH MYOSURE POLYPECTOMY;  Surgeon: Nunzio Cobbs, MD;  Location: [facility];  Service: Gynecology;  Laterality: N/A;  fibroid resection   DILATION AND CURETTAGE OF UTERUS     x2   MASS EXCISION N/A 08/04/2017   Procedure: EXCISION LIPOMA UPPER BACK x2 AND NECK;  Surgeon: Erroll Luna, MD;  Location: [place];  Service: General;  Laterality: N/A;   NASAL SEPTOPLASTY W/ TURBINOPLASTY Bilateral 06/21/2015   Procedure: NASAL SEPTOPLASTY WITH BILATERAL TURBINATE REDUCTION;  Surgeon: Jerrell Belfast, MD;  Location: [place];  Service: ENT;  Laterality: Bilateral;   SINOSCOPY     TONSILLECTOMY     TUBAL LIGATION     VULVECTOMY N/A 12/24/2021   Procedure: WIDE EXCISION VULVECTOMY;  Surgeon: [name], MD;  Location: [facility];  Service: Gynecology;  Laterality: N/A;    Family History  Problem Relation Age of Onset   Allergic rhinitis Father    Allergic rhinitis Sister    Asthma Sister    Endometrial cancer Sister 56   Multiple myeloma Sister    Cancer Maternal Grandmother    Asthma Paternal Aunt    Angioedema Neg Hx    Eczema Neg Hx    Immunodeficiency Neg Hx    Urticaria Neg Hx    Ovarian cancer Neg Hx    Breast cancer Neg Hx    Colon cancer Neg Hx    Prostate cancer Neg Hx    Pancreatic cancer Neg Hx     Social History   Socioeconomic History   Marital status: Married    Spouse name: Not on file   Number of children: Not on file   Years of education: Not on file   Highest education level: Not on file  Occupational History   Occupation: retored  Tobacco Use   Smoking status: Never   Smokeless tobacco: Never  Vaping Use   Vaping Use: Never used   Substance and Sexual Activity   Alcohol use: Yes    Comment: 2 glasses of wine/month   Drug use: No   Sexual activity: Yes    Birth control/protection: Post-menopausal  Other Topics Concern   Not on file  Social History Narrative   Not on file   Social Determinants of Health   Financial Resource Strain: Not on file  Food Insecurity: Not on file  Transportation Needs: Not on file  Physical Activity: Not on file  Stress: Not on file  Social Connections: Not on file    Current Medications:  Current Outpatient Medications:    5-Hydroxytryptophan 200 MG CAPS, Take by mouth., Disp: , Rfl:    acetaminophen (TYLENOL) 500 MG tablet, Take by mouth., Disp: , Rfl:    albuterol (VENTOLIN HFA) 108 (90 Base) MCG/ACT inhaler, , Disp: , Rfl:    amLODipine (NORVASC) 5 MG tablet, Take 5 mg by mouth daily., Disp: , Rfl:    azelastine (ASTELIN) 0.1 % nasal spray, Place 2 sprays into both nostrils 2 (two) times daily. Use in each nostril as directed,  Disp: 60 mL, Rfl: 5   baclofen (LIORESAL) 20 MG tablet, Take 20 mg by mouth 3 (three) times daily as needed., Disp: , Rfl:    CINNAMON PO, 2 capsules Orally once a day, Disp: , Rfl:    famotidine-calcium carbonate-magnesium hydroxide (PEPCID COMPLETE) 10-800-165 MG chewable tablet, 1 tablet Orally Twice a day, Disp: , Rfl:    fluticasone (FLONASE) 50 MCG/ACT nasal spray, Place into both nostrils daily., Disp: , Rfl:    fluticasone (FLOVENT HFA) 44 MCG/ACT inhaler, TAKE 1 PUFF BY MOUTH EVERY DAY, Disp: 10.6 each, Rfl: 5   gabapentin (NEURONTIN) 400 MG capsule, Take 1 capsule by mouth 4 (four) times daily., Disp: , Rfl:    GLUCOSAMINE-CHONDROITIN PO, Take 1 tablet by mouth daily. Move Free Ultra Triple Action, Disp: , Rfl:    glucose blood test strip, as directed finger stick once a day for 90 days, Disp: , Rfl:    hydrochlorothiazide (MICROZIDE) 12.5 MG capsule, 1 1/2, Disp: , Rfl:    levocetirizine (XYZAL) 5 MG tablet, TAKE 1 TABLET BY MOUTH EVERY  EVENING, Disp: 90 tablet, Rfl: 1   levothyroxine (SYNTHROID) 112 MCG tablet, 1 tablet on an empty stomach in the morning, Disp: , Rfl:    losartan (COZAAR) 100 MG tablet, Take 100 mg by mouth daily., Disp: , Rfl:    Melatonin 3 MG TABS, Take 3 mg by mouth as needed., Disp: , Rfl:    metFORMIN (GLUCOPHAGE-XR) 500 MG 24 hr tablet, 1 tablet with evening meal Orally Once a day for 90 days, Disp: , Rfl:    montelukast (SINGULAIR) 10 MG tablet, Take 1 tablet (10 mg total) by mouth at bedtime., Disp: 30 tablet, Rfl: 5   Multiple Vitamin (MULTIVITAMIN WITH MINERALS) TABS tablet, Take 1 tablet by mouth at bedtime., Disp: , Rfl:    MYRBETRIQ 50 MG TB24 tablet, Take 50 mg by mouth daily., Disp: , Rfl:    Semaglutide, 2 MG/DOSE, (OZEMPIC, 2 MG/DOSE,) 8 MG/3ML SOPN, Inject 2 mg into the skin once a week., Disp: , Rfl:    sertraline (ZOLOFT) 50 MG tablet, Take 50 mg by mouth daily., Disp: , Rfl:    simvastatin (ZOCOR) 20 MG tablet, Take 20 mg by mouth at bedtime. , Disp: , Rfl:    zolpidem (AMBIEN) 10 MG tablet, Take 5 mg by mouth at bedtime as needed for sleep. , Disp: , Rfl:   Review of Systems: Denies appetite changes, fevers, chills, fatigue, unexplained weight changes. Denies hearing loss, neck lumps or masses, mouth sores, ringing in ears or voice changes. Denies cough or wheezing.  Denies shortness of breath. Denies chest pain or palpitations. Denies leg swelling. Denies abdominal distention, pain, blood in stools, constipation, diarrhea, nausea, vomiting, or early satiety. Denies pain with intercourse, dysuria, frequency, hematuria or incontinence. Denies hot flashes, pelvic pain, vaginal bleeding or vaginal discharge.   Denies joint pain, back pain or muscle pain/cramps. Denies itching, rash, or wounds. Denies dizziness, headaches, numbness or seizures. Denies swollen lymph nodes or glands, denies easy bruising or bleeding. Denies anxiety, depression, confusion, or decreased  concentration.  Physical Exam: BP (!) 153/76 (BP Location: Left Arm, Patient Position: Sitting)   Pulse 69   Temp 98.6 F (37 C) (Oral)   Resp 18   Ht 5' 5.98" (1.676 m)   Wt 227 lb (103 kg)   LMP 07/06/1998 (Approximate)   SpO2 98%   BMI 36.66 kg/m  General: Alert, oriented, no acute distress.  GU: Mildly erythematous skin around  prior incision, small area along the right labia as well.  Some mild thinning of the skin along the left superior vulva.  No visible lesions.  Laboratory & Radiologic Studies: None new  Assessment & Plan: [name] is a 71 y.o. woman with Stage IB vulvar melanoma who underwent repeat excision in an attempt to achieve negative margins given MIS present. Unfortunately, margins still positive for melanoma in situ. Plan for treatment with Aldara.  Tolerating treatment well.  We will plan to increase Aldara application to 5 times a week at night.  I will have a phone visit with her in a month to assure that she is tolerating increased use.  I will see her back in 2 months.  We will plan for an additional 12 weeks of treatment with then a 1 month break prior to repeat biopsies.  12 minutes of total time was spent for this patient encounter, including preparation, face-to-face counseling with the patient and coordination of care, and documentation of the encounter.  Jeral Pinch, MD  Division of Gynecologic Oncology  Department of Obstetrics and Gynecology  [facility]

## 2022-04-07 DIAGNOSIS — G4733 Obstructive sleep apnea (adult) (pediatric): Secondary | ICD-10-CM | POA: Diagnosis not present

## 2022-04-19 DIAGNOSIS — G4733 Obstructive sleep apnea (adult) (pediatric): Secondary | ICD-10-CM | POA: Diagnosis not present

## 2022-04-23 ENCOUNTER — Inpatient Hospital Stay: Payer: PPO | Attending: Gynecologic Oncology | Admitting: Gynecologic Oncology

## 2022-04-23 ENCOUNTER — Encounter: Payer: Self-pay | Admitting: Gynecologic Oncology

## 2022-04-23 DIAGNOSIS — D038 Melanoma in situ of other sites: Secondary | ICD-10-CM | POA: Diagnosis not present

## 2022-04-23 DIAGNOSIS — C519 Malignant neoplasm of vulva, unspecified: Secondary | ICD-10-CM | POA: Diagnosis not present

## 2022-04-23 MED ORDER — IMIQUIMOD 5 % EX CREA: TOPICAL_CREAM | CUTANEOUS | 2 refills | Status: DC

## 2022-04-23 NOTE — Progress Notes (Signed)
Gynecologic Oncology Telehealth Note: Gyn-Onc  I connected with [name] on 04/23/22 at  4:20 PM EDT by telephone and verified that I am speaking with the correct person using two identifiers.  I discussed the limitations, risks, security and privacy concerns of performing an evaluation and management service by telemedicine and the availability of in-person appointments. I also discussed with the patient that there may be a patient responsible charge related to this service. The patient expressed understanding and agreed to proceed.  Other persons participating in the visit and their role in the encounter: none.  Patient's location: home Provider's location: [facility]  Reason for Visit: follow-up in the setting of aldara cream for melanoma in situ  Treatment History: 08/12/21: Vulvar biopsy revealed vulvar melanoma   09/12/21: Mild hypermetabolism in the lower vulva likely related to recent biopsy/surgery.  No discrete hypermetabolic mass.  No adenopathy.   11/04/2021: Left vulvar wide local excision and left inguinal sentinel lymph node biopsy with Surgical Oncology. Operative Findings: 1 cm melanotic lesion on medial aspect of anterior left labia minora. 1 sentinel lymph node identified in the subcutaneous fat superficial to the external oblique.  Pathology:  Diagnosis  A: Sentinel lymph node, left inguinal, removal - Negative for melanoma in one lymph node (0/1). B: Vulva, excision - Malignant melanoma. Melanoma in situ extends broadly to 11-12-10 o'clock peripheral margin and focally to the 3 o'clock peripheral margin. Margins are negative for invasive melanoma. Type: Vulvar Clark Level: IV Breslow thickness: 1.8 mm Growth phase: Vertical, spindled melanocytes with light pigment Mitoses: per square millimeter Nuclear grade: 2/3 Tumor infiltrating lymphocytes:  Regression: Not identified Ulceration: Not identified  Microscopic satellite: Not identified Vascular invasion: Not  identified Perineural invasion: Not identified Associated nevus: Not identified  Margins: Melanoma in situ extends broadly to 11-12-10 o'clock peripheral margin (B3-B5) and focally to the 3 o'clock peripheral margin (B3). Margins are negative for invasive melanoma Pathologic (pT, AJCC 8th edition) staging: pT2a C: Vulva, deep margin, excision - Connective tissue.  - Negative for melanoma   11/19/2021 Tumor Board recommendations.  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 Plan: Repeat excision of vulvar melanoma in situ  - Medical Oncology referral for consideration of additional treatment (observation vs pembro vs locoregional radiation), scheduled 5/22 Medical oncology recommendations: CT C/A/P q 3 months for 1 year then q 6 months until year 3, then annual. Consider imiquimod for the in situ component if the margins on reexcision were to show melanoma in situ. Obtain full molecular profile with [employer] testing - testing showed no targetable mutations or obvious germline mutation.   12/24/2021: Partial simple left anterior vulvectomy given margins positive for vulvar melanoma in situ.  Prior scar identified, no definitive evidence visually of melanoma lesion.  mL in situ noted involving all margins.  No residual invasive disease noted.  Interval History: Some irritation, more the second week after increasing dose. Some swelling. Tolerating things very well. No blistering or bleeding.  Past Medical/Surgical History: Past Medical History:  Diagnosis Date   Asthma    has rescue inhaler, now on Flovent   Chronic back pain    Diabetes mellitus without complication ([place])    on medformin, on Ozempic   GERD (gastroesophageal reflux disease)    History of COVID-19 04/05/2021   Hyperlipemia    Hypertension    Hypothyroidism    Insomnia    OAB (overactive bladder)  Seasonal allergies    Sleep apnea    uses a cpap    Past Surgical History:  Procedure Laterality Date   ANKLE ARTHROTOMY  2003   left-fx   APPENDECTOMY     back fusion  02/14/2019   L4 back fusion   BACK SURGERY  2010   lumb disk   BREAST LUMPECTOMY WITH RADIOACTIVE SEED LOCALIZATION Left 06/13/2019   Procedure: LEFT BREAST LUMPECTOMY WITH RADIOACTIVE SEED LOCALIZATION;  Surgeon: Erroll Luna, MD;  Location: [place];  Service: General;  Laterality: Left;   CARPAL TUNNEL RELEASE     rt   CARPAL TUNNEL RELEASE Left 08/01/2013   Procedure: LEFT CARPAL TUNNEL RELEASE;  Surgeon: Wynonia Sours, MD;  Location: [facility];  Service: Orthopedics;  Laterality: Left;   CESAREAN SECTION  83,86   COLONOSCOPY     DILATATION & CURETTAGE/HYSTEROSCOPY WITH MYOSURE N/A 08/23/2018   Procedure: DILATATION & CURETTAGE/HYSTEROSCOPY WITH MYOSURE POLYPECTOMY;  Surgeon: Nunzio Cobbs, MD;  Location: [facility];  Service: Gynecology;  Laterality: N/A;  fibroid resection   DILATION AND CURETTAGE OF UTERUS     x2   MASS EXCISION N/A 08/04/2017   Procedure: EXCISION LIPOMA UPPER BACK x2 AND NECK;  Surgeon: Erroll Luna, MD;  Location: [facility];  Service: General;  Laterality: N/A;   NASAL SEPTOPLASTY W/ TURBINOPLASTY Bilateral 06/21/2015   Procedure: NASAL SEPTOPLASTY WITH BILATERAL TURBINATE REDUCTION;  Surgeon: Jerrell Belfast, MD;  Location: [facility];  Service: ENT;  Laterality: Bilateral;   SINOSCOPY     TONSILLECTOMY     TUBAL LIGATION     VULVECTOMY N/A 12/24/2021   Procedure: WIDE EXCISION VULVECTOMY;  Surgeon: [name], MD;  Location: [facility];  Service: Gynecology;  Laterality: N/A;    Family History  Problem Relation Age of Onset   Allergic rhinitis Father    Allergic rhinitis Sister    Asthma Sister    Endometrial cancer Sister 23   Multiple myeloma Sister    Cancer Maternal  Grandmother    Asthma Paternal Aunt    Angioedema Neg Hx    Eczema Neg Hx    Immunodeficiency Neg Hx    Urticaria Neg Hx    Ovarian cancer Neg Hx    Breast cancer Neg Hx    Colon cancer Neg Hx    Prostate cancer Neg Hx    Pancreatic cancer Neg Hx     Social History   Socioeconomic History   Marital status: Married    Spouse name: Not on file   Number of children: Not on file   Years of education: Not on file   Highest education level: Not on file  Occupational History   Occupation: retored  Tobacco Use   Smoking status: Never   Smokeless tobacco: Never  Vaping Use   Vaping Use: Never used  Substance and Sexual Activity   Alcohol use: Yes    Comment: 2 glasses of wine/month   Drug use: No   Sexual activity: Yes    Birth control/protection: Post-menopausal  Other Topics Concern   Not on file  Social History Narrative   Not on file   Social Determinants of Health   Financial Resource Strain: Not on file  Food Insecurity: Not on file  Transportation Needs: Not on file  Physical Activity: Not on file  Stress: Not on file  Social Connections: Not on file    Current Medications:  Current  Outpatient Medications:    5-Hydroxytryptophan 200 MG CAPS, Take by mouth., Disp: , Rfl:    acetaminophen (TYLENOL) 500 MG tablet, Take by mouth., Disp: , Rfl:    albuterol (VENTOLIN HFA) 108 (90 Base) MCG/ACT inhaler, , Disp: , Rfl:    amLODipine (NORVASC) 5 MG tablet, Take 5 mg by mouth daily., Disp: , Rfl:    azelastine (ASTELIN) 0.1 % nasal spray, Place 2 sprays into both nostrils 2 (two) times daily. Use in each nostril as directed, Disp: 60 mL, Rfl: 5   baclofen (LIORESAL) 20 MG tablet, Take 20 mg by mouth 3 (three) times daily as needed., Disp: , Rfl:    CINNAMON PO, 2 capsules Orally once a day, Disp: , Rfl:    famotidine-calcium carbonate-magnesium hydroxide (PEPCID COMPLETE) 10-800-165 MG chewable tablet, 1 tablet Orally Twice a day, Disp: , Rfl:    fluticasone (FLONASE)  50 MCG/ACT nasal spray, Place into both nostrils daily., Disp: , Rfl:    fluticasone (FLOVENT HFA) 44 MCG/ACT inhaler, TAKE 1 PUFF BY MOUTH EVERY DAY, Disp: 10.6 each, Rfl: 5   gabapentin (NEURONTIN) 400 MG capsule, Take 1 capsule by mouth 4 (four) times daily., Disp: , Rfl:    GLUCOSAMINE-CHONDROITIN PO, Take 1 tablet by mouth daily. Move Free Ultra Triple Action, Disp: , Rfl:    glucose blood test strip, as directed finger stick once a day for 90 days, Disp: , Rfl:    hydrochlorothiazide (MICROZIDE) 12.5 MG capsule, 1 1/2, Disp: , Rfl:    levocetirizine (XYZAL) 5 MG tablet, TAKE 1 TABLET BY MOUTH EVERY EVENING, Disp: 90 tablet, Rfl: 1   levothyroxine (SYNTHROID) 112 MCG tablet, 1 tablet on an empty stomach in the morning, Disp: , Rfl:    losartan (COZAAR) 100 MG tablet, Take 100 mg by mouth daily., Disp: , Rfl:    Melatonin 3 MG TABS, Take 3 mg by mouth as needed., Disp: , Rfl:    metFORMIN (GLUCOPHAGE-XR) 500 MG 24 hr tablet, 1 tablet with evening meal Orally Once a day for 90 days, Disp: , Rfl:    montelukast (SINGULAIR) 10 MG tablet, Take 1 tablet (10 mg total) by mouth at bedtime., Disp: 30 tablet, Rfl: 5   Multiple Vitamin (MULTIVITAMIN WITH MINERALS) TABS tablet, Take 1 tablet by mouth at bedtime., Disp: , Rfl:    MYRBETRIQ 50 MG TB24 tablet, Take 50 mg by mouth daily., Disp: , Rfl:    Semaglutide, 2 MG/DOSE, (OZEMPIC, 2 MG/DOSE,) 8 MG/3ML SOPN, Inject 2 mg into the skin once a week., Disp: , Rfl:    sertraline (ZOLOFT) 50 MG tablet, Take 50 mg by mouth daily., Disp: , Rfl:    simvastatin (ZOCOR) 20 MG tablet, Take 20 mg by mouth at bedtime. , Disp: , Rfl:    zolpidem (AMBIEN) 10 MG tablet, Take 5 mg by mouth at bedtime as needed for sleep. , Disp: , Rfl:   Review of Symptoms: Pertinent positives as per HPI.  Physical Exam: Deferred given limitations of phone visit.  Laboratory & Radiologic Studies: None new  Assessment & Plan: [name] is a 71 y.o. woman with Stage IB  vulvar melanoma who underwent repeat excision in an attempt to achieve negative margins given MIS present. Unfortunately, margins still positive for melanoma in situ. Undergoing treatment with Aldara.  Tolerating increase to 5x a day application. No significant symptoms. Plan for exam in clinic in 1 month with total of 16 weeks of treatment and a 4 week break prior to  repeating biopsies. Next CT for surveillance ordered for mid-November. New prescription sent for Aldara.  I discussed the assessment and treatment plan with the patient. The patient was provided with an opportunity to ask questions and all were answered. The patient agreed with the plan and demonstrated an understanding of the instructions.   The patient was advised to call back or see an in-person evaluation if the symptoms worsen or if the condition fails to improve as anticipated.   8 minutes of total time was spent for this patient encounter, including preparation, phone counseling with the patient and coordination of care, and documentation of the encounter.   Jeral Pinch, MD  Division of Gynecologic Oncology  Department of Obstetrics and Gynecology  [facility]

## 2022-05-15 ENCOUNTER — Ambulatory Visit (HOSPITAL_COMMUNITY)
Admission: RE | Admit: 2022-05-15 | Discharge: 2022-05-15 | Disposition: A | Discharge status: Home / Self Care | Payer: PPO | Source: Ambulatory Visit | Attending: Gynecologic Oncology | Admitting: Gynecologic Oncology

## 2022-05-15 ENCOUNTER — Encounter (HOSPITAL_COMMUNITY): Payer: Self-pay

## 2022-05-15 DIAGNOSIS — E119 Type 2 diabetes mellitus without complications: Secondary | ICD-10-CM

## 2022-05-15 DIAGNOSIS — K8020 Calculus of gallbladder without cholecystitis without obstruction: Secondary | ICD-10-CM | POA: Diagnosis not present

## 2022-05-15 DIAGNOSIS — I700 Atherosclerosis of aorta: Secondary | ICD-10-CM | POA: Diagnosis not present

## 2022-05-15 DIAGNOSIS — C519 Malignant neoplasm of vulva, unspecified: Secondary | ICD-10-CM

## 2022-05-15 DIAGNOSIS — R918 Other nonspecific abnormal finding of lung field: Secondary | ICD-10-CM | POA: Diagnosis not present

## 2022-05-15 DIAGNOSIS — K760 Fatty (change of) liver, not elsewhere classified: Secondary | ICD-10-CM | POA: Diagnosis not present

## 2022-05-15 DIAGNOSIS — D259 Leiomyoma of uterus, unspecified: Secondary | ICD-10-CM | POA: Diagnosis not present

## 2022-05-15 LAB — POCT I-STAT CREATININE: Creatinine, Ser: 0.9 mg/dL (ref 0.44–1.00)

## 2022-05-15 MED ORDER — IOHEXOL 300 MG/ML  SOLN
100.0000 mL | Freq: Once | INTRAMUSCULAR | Status: AC | PRN
  Administered 2022-05-15: 100 mL via INTRAVENOUS

## 2022-05-18 ENCOUNTER — Telehealth: Payer: Self-pay | Admitting: Gynecologic Oncology

## 2022-05-18 ENCOUNTER — Encounter: Payer: Self-pay | Admitting: Emergency Medicine

## 2022-05-18 DIAGNOSIS — C519 Malignant neoplasm of vulva, unspecified: Secondary | ICD-10-CM

## 2022-05-18 NOTE — Progress Notes (Signed)
Mir, Paula Libra, MD  Donita Brooks D Approved for CT guided biopsy of left inguinal lymph node.  Please see CT from 05/15/2022, axial series, image 123.  The node is small and adjacent to surgical clips, but it is certainly larger since last exam.  There is another left external iliac chain LN which could be targeted given interval growth, but I think is really tough target giving proximity of vessels and small size.  Mir

## 2022-05-18 NOTE — Telephone Encounter (Signed)
I called the patient.  Discussed PET scan findings.  Overall, lung lesions are stable to slightly decreased, overall reassuring.  There are couple lymph nodes in the pelvis, left groin and left external iliac that are enlarged since last scan.  Discussed with her attempting ultrasound-guided biopsy to rule out metastatic melanoma.  Jeral Pinch MD Gynecologic Oncology

## 2022-05-20 DIAGNOSIS — G4733 Obstructive sleep apnea (adult) (pediatric): Secondary | ICD-10-CM | POA: Diagnosis not present

## 2022-05-22 ENCOUNTER — Other Ambulatory Visit: Payer: Self-pay

## 2022-05-22 ENCOUNTER — Inpatient Hospital Stay: Payer: PPO | Attending: Gynecologic Oncology | Admitting: Gynecologic Oncology

## 2022-05-22 ENCOUNTER — Encounter: Payer: Self-pay | Admitting: Gynecologic Oncology

## 2022-05-22 VITALS — BP 160/71 | HR 64 | Temp 98.5°F | Resp 18 | Ht 65.75 in | Wt 222.6 lb

## 2022-05-22 DIAGNOSIS — Z7189 Other specified counseling: Secondary | ICD-10-CM

## 2022-05-22 DIAGNOSIS — R918 Other nonspecific abnormal finding of lung field: Secondary | ICD-10-CM | POA: Diagnosis not present

## 2022-05-22 DIAGNOSIS — C519 Malignant neoplasm of vulva, unspecified: Secondary | ICD-10-CM | POA: Diagnosis not present

## 2022-05-22 DIAGNOSIS — R599 Enlarged lymph nodes, unspecified: Secondary | ICD-10-CM

## 2022-05-22 DIAGNOSIS — Z79899 Other long term (current) drug therapy: Secondary | ICD-10-CM | POA: Diagnosis not present

## 2022-05-22 DIAGNOSIS — D039 Melanoma in situ, unspecified: Secondary | ICD-10-CM

## 2022-05-22 NOTE — Progress Notes (Signed)
Gynecologic Oncology Return Clinic Visit  05/22/22  Reason for Visit: follow-up  Treatment History: 08/12/21: Vulvar biopsy revealed vulvar melanoma   09/12/21: Mild hypermetabolism in the lower vulva likely related to recent biopsy/surgery.  No discrete hypermetabolic mass.  No adenopathy.   11/04/2021: Left vulvar wide local excision and left inguinal sentinel lymph node biopsy with Surgical Oncology. Operative Findings: 1 cm melanotic lesion on medial aspect of anterior left labia minora. 1 sentinel lymph node identified in the subcutaneous fat superficial to the external oblique.  Pathology:  Diagnosis  A: Sentinel lymph node, left inguinal, removal - Negative for melanoma in one lymph node (0/1). B: Vulva, excision - Malignant melanoma. Melanoma in situ extends broadly to 11-12-10 o'clock peripheral margin and focally to the 3 o'clock peripheral margin. Margins are negative for invasive melanoma. Type: Vulvar Clark Level: IV Breslow thickness: 1.8 mm Growth phase: Vertical, spindled melanocytes with light pigment Mitoses: per square millimeter Nuclear grade: 2/3 Tumor infiltrating lymphocytes:  Regression: Not identified Ulceration: Not identified  Microscopic satellite: Not identified Vascular invasion: Not identified Perineural invasion: Not identified Associated nevus: Not identified  Margins: Melanoma in situ extends broadly to 11-12-10 o'clock peripheral margin (B3-B5) and focally to the 3 o'clock peripheral margin (B3). Margins are negative for invasive melanoma Pathologic (pT, AJCC 8th edition) staging: pT2a C: Vulva, deep margin, excision - Connective tissue.  - Negative for melanoma   11/19/2021 Tumor Board recommendations.  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 Plan: Repeat excision of vulvar melanoma in situ  - Medical Oncology referral for  consideration of additional treatment (observation vs pembro vs locoregional radiation), scheduled 5/22 Medical oncology recommendations: CT C/A/P q 3 months for 1 year then q 6 months until year 3, then annual. Consider imiquimod for the in situ component if the margins on reexcision were to show melanoma in situ. Obtain full molecular profile with [employer] testing - testing showed no targetable mutations or obvious germline mutation.   12/24/2021: Partial simple left anterior vulvectomy given margins positive for vulvar melanoma in situ.  Prior scar identified, no definitive evidence visually of melanoma lesion.  mL in situ noted involving all margins.  No residual invasive disease noted.  02/2022: CT chest, abdomen, pelvis shows multiple bilateral pulmonary nodules including previously described 5 mm left lower lobe nodule.  There is suggestion of mild increase in size of 8 mm left upper lobe nodule.  Nonspecific, recommended close follow-up.  Interval postsurgical changes in the left inguinal region.  Too small to characterize 9 mm low-attenuation lesion in the left hepatic lobe, recommended attention on follow-up.  Interval History: Doing well.  Notes some swelling of her vulva.  Has intermittent burning, pruritus, and pain, but is overall tolerating the Aldara well.  She is using it 5 times a week at night.  Denies any discharge or bleeding.  Uses Vaseline sometimes in the morning.  Past Medical/Surgical History: Past Medical History:  Diagnosis Date   Asthma    has rescue inhaler, now on Flovent   Chronic back pain    Diabetes mellitus without complication ([place])    on medformin, on Ozempic   GERD (gastroesophageal reflux disease)    History of COVID-19 04/05/2021   Hyperlipemia    Hypertension    Hypothyroidism    Insomnia    OAB (overactive bladder)    Seasonal allergies    Sleep apnea  uses a cpap    Past Surgical History:  Procedure Laterality Date   ANKLE ARTHROTOMY  2003    left-fx   APPENDECTOMY     back fusion  02/14/2019   L4 back fusion   BACK SURGERY  2010   lumb disk   BREAST LUMPECTOMY WITH RADIOACTIVE SEED LOCALIZATION Left 06/13/2019   Procedure: LEFT BREAST LUMPECTOMY WITH RADIOACTIVE SEED LOCALIZATION;  Surgeon: Erroll Luna, MD;  Location: [place];  Service: General;  Laterality: Left;   CARPAL TUNNEL RELEASE     rt   CARPAL TUNNEL RELEASE Left 08/01/2013   Procedure: LEFT CARPAL TUNNEL RELEASE;  Surgeon: Wynonia Sours, MD;  Location: [place];  Service: Orthopedics;  Laterality: Left;   CESAREAN SECTION  83,86   COLONOSCOPY     DILATATION & CURETTAGE/HYSTEROSCOPY WITH MYOSURE N/A 08/23/2018   Procedure: DILATATION & CURETTAGE/HYSTEROSCOPY WITH MYOSURE POLYPECTOMY;  Surgeon: Nunzio Cobbs, MD;  Location: [facility];  Service: Gynecology;  Laterality: N/A;  fibroid resection   DILATION AND CURETTAGE OF UTERUS     x2   MASS EXCISION N/A 08/04/2017   Procedure: EXCISION LIPOMA UPPER BACK x2 AND NECK;  Surgeon: Erroll Luna, MD;  Location: [place];  Service: General;  Laterality: N/A;   NASAL SEPTOPLASTY W/ TURBINOPLASTY Bilateral 06/21/2015   Procedure: NASAL SEPTOPLASTY WITH BILATERAL TURBINATE REDUCTION;  Surgeon: Jerrell Belfast, MD;  Location: [place];  Service: ENT;  Laterality: Bilateral;   SINOSCOPY     TONSILLECTOMY     TUBAL LIGATION     VULVECTOMY N/A 12/24/2021   Procedure: WIDE EXCISION VULVECTOMY;  Surgeon: [name], MD;  Location: [facility];  Service: Gynecology;  Laterality: N/A;    Family History  Problem Relation Age of Onset   Allergic rhinitis Father    Allergic rhinitis Sister    Asthma Sister    Endometrial cancer Sister 39   Multiple myeloma Sister    Cancer Maternal Grandmother    Asthma Paternal Aunt    Angioedema Neg Hx    Eczema Neg Hx    Immunodeficiency Neg Hx    Urticaria Neg Hx    Ovarian cancer  Neg Hx    Breast cancer Neg Hx    Colon cancer Neg Hx    Prostate cancer Neg Hx    Pancreatic cancer Neg Hx     Social History   Socioeconomic History   Marital status: Married    Spouse name: Not on file   Number of children: Not on file   Years of education: Not on file   Highest education level: Not on file  Occupational History   Occupation: retored  Tobacco Use   Smoking status: Never   Smokeless tobacco: Never  Vaping Use   Vaping Use: Never used  Substance and Sexual Activity   Alcohol use: Yes    Comment: 2 glasses of wine/month   Drug use: No   Sexual activity: Yes    Birth control/protection: Post-menopausal  Other Topics Concern   Not on file  Social History Narrative   Not on file   Social Determinants of Health   Financial Resource Strain: Not on file  Food Insecurity: Not on file  Transportation Needs: Not on file  Physical Activity: Not on file  Stress: Not on file  Social Connections: Not on file    Current Medications:  Current Outpatient Medications:    5-Hydroxytryptophan (5-HTP PO), Take  1 tablet by mouth daily., Disp: , Rfl:    acetaminophen (TYLENOL) 500 MG tablet, Take 500 mg by mouth 2 (two) times daily., Disp: , Rfl:    albuterol (VENTOLIN HFA) 108 (90 Base) MCG/ACT inhaler, Inhale 2 puffs into the lungs every 4 (four) hours as needed for wheezing or shortness of breath., Disp: , Rfl:    amLODipine (NORVASC) 5 MG tablet, Take 5 mg by mouth daily., Disp: , Rfl:    azelastine (ASTELIN) 0.1 % nasal spray, Place 2 sprays into both nostrils 2 (two) times daily. Use in each nostril as directed (Patient taking differently: Place 1 spray into both nostrils every evening. Use in each nostril as directed), Disp: 60 mL, Rfl: 5   baclofen (LIORESAL) 20 MG tablet, Take 20 mg by mouth See admin instructions. Take 20 mg daily, may take up to 3 times daily as needed for spasms, Disp: , Rfl:    CINNAMON PO, Take 1,000 mg by mouth in the morning and at  bedtime., Disp: , Rfl:    diclofenac Sodium (VOLTAREN) 1 % GEL, Apply 2 g topically 4 (four) times daily as needed (pain)., Disp: , Rfl:    famotidine-calcium carbonate-magnesium hydroxide (PEPCID COMPLETE) 10-800-165 MG chewable tablet, Chew 1 tablet by mouth every evening., Disp: , Rfl:    fluticasone (FLONASE) 50 MCG/ACT nasal spray, Place 1 spray into both nostrils daily., Disp: , Rfl:    fluticasone (FLOVENT HFA) 44 MCG/ACT inhaler, TAKE 1 PUFF BY MOUTH EVERY DAY, Disp: 10.6 each, Rfl: 5   gabapentin (NEURONTIN) 400 MG capsule, Take 400-800 mg by mouth See admin instructions. Take 400 mg in the morning and 800 mg at night, Disp: , Rfl:    GLUCOSAMINE-CHONDROITIN PO, Take 2 tablets by mouth daily., Disp: , Rfl:    glucose blood test strip, as directed finger stick once a day for 90 days, Disp: , Rfl:    hydrochlorothiazide (MICROZIDE) 12.5 MG capsule, Take 12.5 mg by mouth daily., Disp: , Rfl:    imiquimod (ALDARA) 5 % cream, Apply topically every Monday, Tuesday, Wednesday, Thursday, and Friday. Use 5x a week (Mon-Fri), Disp: 20 each, Rfl: 2   levocetirizine (XYZAL) 5 MG tablet, TAKE 1 TABLET BY MOUTH EVERY EVENING (Patient taking differently: Take 5 mg by mouth every other day.), Disp: 90 tablet, Rfl: 1   levothyroxine (SYNTHROID) 112 MCG tablet, Take 112 mcg by mouth daily before breakfast., Disp: , Rfl:    losartan (COZAAR) 100 MG tablet, Take 100 mg by mouth daily., Disp: , Rfl:    Melatonin 3 MG TABS, Take 3 mg by mouth at bedtime., Disp: , Rfl:    meloxicam (MOBIC) 7.5 MG tablet, Take 7.5 mg by mouth 2 (two) times daily as needed for pain., Disp: , Rfl:    metFORMIN (GLUCOPHAGE-XR) 500 MG 24 hr tablet, Take 500 mg by mouth daily., Disp: , Rfl:    montelukast (SINGULAIR) 10 MG tablet, Take 1 tablet (10 mg total) by mouth at bedtime., Disp: 30 tablet, Rfl: 5   Multiple Vitamin (MULTIVITAMIN WITH MINERALS) TABS tablet, Take 1 tablet by mouth at bedtime., Disp: , Rfl:    MYRBETRIQ 50 MG TB24  tablet, Take 50 mg by mouth daily., Disp: , Rfl:    Semaglutide, 2 MG/DOSE, (OZEMPIC, 2 MG/DOSE,) 8 MG/3ML SOPN, Inject 2 mg into the skin every Wednesday., Disp: , Rfl:    sertraline (ZOLOFT) 50 MG tablet, Take 50 mg by mouth daily., Disp: , Rfl:    simvastatin (ZOCOR) 20  MG tablet, Take 20 mg by mouth at bedtime. , Disp: , Rfl:    zolpidem (AMBIEN) 10 MG tablet, Take 5 mg by mouth at bedtime as needed for sleep. , Disp: , Rfl:   Review of Systems: + right hip pain Denies appetite changes, fevers, chills, fatigue, unexplained weight changes. Denies hearing loss, neck lumps or masses, mouth sores, ringing in ears or voice changes. Denies cough or wheezing.  Denies shortness of breath. Denies chest pain or palpitations. Denies leg swelling. Denies abdominal distention, pain, blood in stools, constipation, diarrhea, nausea, vomiting, or early satiety. Denies pain with intercourse, dysuria, frequency, hematuria or incontinence. Denies hot flashes, pelvic pain, vaginal bleeding or vaginal discharge.   Denies back pain or muscle pain/cramps. Denies itching, rash, or wounds. Denies dizziness, headaches, numbness or seizures. Denies swollen lymph nodes or glands, denies easy bruising or bleeding. Denies anxiety, depression, confusion, or decreased concentration.  Physical Exam: BP (!) 160/71 (BP Location: Left Arm)   Pulse 64   Temp 98.5 F (36.9 C) (Oral)   Resp 18   Ht 5' 5.75" (1.67 m)   Wt 222 lb 9.6 oz (101 kg)   LMP 07/06/1998 (Approximate)   SpO2 97%   BMI 36.20 kg/m  General: Alert, oriented, no acute distress. HEENT: Normocephalic, atraumatic, sclera anicteric. Chest: Unlabored breathing on room air. Lymphatics: No palpable cervical, supraclavicular, or inguinal adenopathy. GU: External female genitalia notable for significant erythema of bilateral outer labia along the anterior vulva with some crusting along the skin.  The clitoris, tissue on the left aspect of the upper vulva  are normal in appearance.  Laboratory & Radiologic Studies: CT C/A/P on 11/10: IMPRESSION: 1. Interval increase in size of prominent left external iliac and inguinal lymph nodes including 1 directly adjacent to the left inguinal surgical clips, nonspecific and possibly reactive but suspicious for nodal metastatic disease. 2. Stable nonspecific bilateral pulmonary nodules, continued attention on follow-up imaging suggested. 3. Stable 9 mm hypodensity in the left hepatic lobe, technically too small to accurately characterize but distinctly not typical in appearance for that of a melanoma metastasis favored to reflect a cyst or hemangioma. Suggest continued attention on follow-up imaging. 4. Increased prominence of the left gonadal vein and pelvic collateral vessels, which can be seen in the setting of pelvic venous insufficiency. 5. Hepatic steatosis. 6. Cholelithiasis without findings of acute cholecystitis. 7.  Aortic Atherosclerosis (ICD10-I70.0).  Assessment & Plan: [name] is a 71 y.o. woman with Stage IB vulvar melanoma who underwent repeat excision in an attempt to achieve negative margins given MIS present. Unfortunately, margins still positive for melanoma in situ. Undergoing treatment with Aldara.  Patient is tolerating 5 times a week Aldara treatment.  She has another 3 weeks to complete 16 weeks total of treatment.  We will plan for biopsies approximately 4 weeks after she finishes treatment.  Her preference is to do these in the outpatient OR setting.  She will come back for preoperative visit in early January with plan for biopsies likely the second week of January.  She and I have previously discussed recent CT scan findings by phone.  We reviewed these again today.  Pulmonary nodules remain stable to slightly smaller compared to prior imaging.  She has a stable less than 1 cm hypodensity in the left hepatic lobe, favored to be unrelated to her melanoma.  There has  been interval increase in size of 1 prominent left external iliac lymph node and 1 left inguinal lymph node,  adjacent to the surgical clips.   We discussed that it adenopathy in the setting of Aldara use has been described.  Given her history of melanoma, though, with enlarging lymph nodes, I recommend tissue sampling.  She is scheduled for a biopsy on Tuesday.  I will call her with these results.  20 minutes of total time was spent for this patient encounter, including preparation, face-to-face counseling with the patient and coordination of care, and documentation of the encounter.  Jeral Pinch, MD  Division of Gynecologic Oncology  Department of Obstetrics and Gynecology  [facility]

## 2022-05-22 NOTE — Patient Instructions (Signed)
Preparing for your Surgery  Plan for surgery on July 17, 2021 with Dr. Jeral Pinch at [facility]. You will be scheduled for examination under anesthesia, vulvar biopsies.   Pre-operative Testing -You will receive a phone call from presurgical testing at [facility] to discuss surgery instructions and arrange for lab work if needed.  -Bring your insurance card, copy of an advanced directive if applicable, medication list.  -You should not be taking blood thinners or aspirin at least ten days prior to surgery unless instructed by your surgeon.  -Do not take supplements such as fish oil (omega 3), red yeast rice, turmeric before your surgery. You want to avoid medications with aspirin in them including headache powders such as BC or Goody's), Excedrin migraine.  Day Before Surgery at [place] will be advised you can have clear liquids up until 3 hours before your surgery.    Your role in recovery Your role is to become active as soon as directed by your doctor, while still giving yourself time to heal.  Rest when you feel tired. You will be asked to do the following in order to speed your recovery:  - Cough and breathe deeply. This helps to clear and expand your lungs and can prevent pneumonia after surgery.  - [place]. Do mild physical activity. Walking or moving your legs help your circulation and body functions return to normal. Do not try to get up or walk alone the first time after surgery.   -If you develop swelling on one leg or the other, pain in the back of your leg, redness/warmth in one of your legs, please call the office or go to the Emergency Room to have a doppler to rule out a blood clot. For shortness of breath, chest pain-seek care in the Emergency Room as soon as possible. - Actively manage your pain. Managing your pain lets you move in comfort. We will ask you to rate your pain on a scale of zero to 10. It  is your responsibility to tell your doctor or nurse where and how much you hurt so your pain can be treated.  Special Considerations -Your final pathology results from surgery should be available around one week after surgery and the results will be relayed to you when available.  -FMLA forms can be faxed to [phone] and please allow 5-7 business days for completion.  Pain Management After Surgery -Make sure that you have Tylenol and Ibuprofen at home IF [place] to use on a regular basis after surgery for pain control. We recommend alternating the medications every hour to six hours since they work differently and are processed in the body differently for pain relief.  -Review the attached handout on narcotic use and their risks and side effects.   Bowel Regimen -It is important to prevent constipation and drink adequate amounts of liquids. You can stop taking this medication when you are not taking pain medication and you are back on your normal bowel routine.  Risks of Surgery Risks of surgery are low but include bleeding, infection, damage to surrounding structures, re-operation, blood clots, and very rarely death.  AFTER SURGERY INSTRUCTIONS  Return to work:  variable based on occupation  Activity: 1. Be up and out of the bed during the day.  Take a nap if needed.  You may walk up steps but be careful and use the hand rail.  Stair climbing  will tire you more than you think, you may need to stop part way and rest.   2. No lifting or straining for 2 weeks over 10 pounds. No pushing, pulling, straining for 2 weeks minimum.  3. No driving for minimum 24 hours after surgery.  Do not drive if you are taking narcotic pain medicine and make sure that your reaction time has returned.   4. You can shower as soon as the next day after surgery. Shower daily. No tub baths or submerging your body in water until cleared by your surgeon. If you have the soap that  was given to you by pre-surgical testing that was used before surgery, you do not need to use it afterwards because this can irritate your incisions.   5. No sexual activity and nothing in the vagina for 4 weeks.  6. You may experience vaginal spotting and discharge after surgery.  The spotting is normal but if you experience heavy bleeding, call our office.  7. Take Tylenol or ibuprofen first for pain if you are able to take these medications.  Monitor your Tylenol intake to a max of 4,000 mg in a 24 hour period. You can alternate these medications after surgery.  Diet: 1. Low sodium Heart Healthy Diet is recommended but you are cleared to resume your normal (before surgery) diet after your procedure.  2. It is safe to use a laxative, such as Miralax or Colace, if you have difficulty moving your bowels.   Wound Care: 1. Keep clean and dry.  Shower daily.  Reasons to call the Doctor: Fever - Oral temperature greater than 100.4 degrees Fahrenheit Foul-smelling vaginal discharge Difficulty urinating Nausea and vomiting Increased pain at the site of the incision that is unrelieved with pain medicine. Difficulty breathing with or without chest pain New calf pain especially if only on one side Sudden, continuing increased vaginal bleeding with or without clots.   Contacts: For questions or concerns you should contact:  Dr. Jeral Pinch at [phone]  Joylene John, NP at [phone]  After Hours: call [phone] and have the GYN Oncologist paged/contacted (after 5 pm or on the weekends).  Messages sent via mychart are for non-urgent matters and are not responded to after hours so for urgent needs, please call the after hours number.

## 2022-05-24 ENCOUNTER — Encounter (HOSPITAL_COMMUNITY): Payer: Self-pay

## 2022-05-24 NOTE — H&P (Signed)
Chief Complaint: Patient was seen in consultation today for No chief complaint on file.  at the request of Tucker,Katherine R  Referring Physician(s): Tucker,Katherine R  Supervising Physician: {Supervising Physician:[id number]}  Patient Status: {IR Consult Patient Status:21879}  History of Present Illness: [name] is a 71 y.o. female ***  Past Medical History:  Diagnosis Date   Asthma    has rescue inhaler, now on Flovent   Chronic back pain    Diabetes mellitus without complication ([place])    on medformin, on Ozempic   GERD (gastroesophageal reflux disease)    History of COVID-19 04/05/2021   Hyperlipemia    Hypertension    Hypothyroidism    Insomnia    OAB (overactive bladder)    Seasonal allergies    Sleep apnea    uses a cpap    Past Surgical History:  Procedure Laterality Date   ANKLE ARTHROTOMY  2003   left-fx   APPENDECTOMY     back fusion  02/14/2019   L4 back fusion   BACK SURGERY  2010   lumb disk   BREAST LUMPECTOMY WITH RADIOACTIVE SEED LOCALIZATION Left 06/13/2019   Procedure: LEFT BREAST LUMPECTOMY WITH RADIOACTIVE SEED LOCALIZATION;  Surgeon: Erroll Luna, MD;  Location: [place];  Service: General;  Laterality: Left;   CARPAL TUNNEL RELEASE     rt   CARPAL TUNNEL RELEASE Left 08/01/2013   Procedure: LEFT CARPAL TUNNEL RELEASE;  Surgeon: Wynonia Sours, MD;  Location: [place];  Service: Orthopedics;  Laterality: Left;   CESAREAN SECTION  83,86   COLONOSCOPY     DILATATION & CURETTAGE/HYSTEROSCOPY WITH MYOSURE N/A 08/23/2018   Procedure: DILATATION & CURETTAGE/HYSTEROSCOPY WITH MYOSURE POLYPECTOMY;  Surgeon: Nunzio Cobbs, MD;  Location: [facility];  Service: Gynecology;  Laterality: N/A;  fibroid resection   DILATION AND CURETTAGE OF UTERUS     x2   MASS EXCISION N/A 08/04/2017   Procedure: EXCISION LIPOMA UPPER BACK x2 AND NECK;  Surgeon: Erroll Luna, MD;  Location: [place];  Service: General;  Laterality: N/A;   NASAL SEPTOPLASTY W/ TURBINOPLASTY Bilateral 06/21/2015   Procedure: NASAL SEPTOPLASTY WITH BILATERAL TURBINATE REDUCTION;  Surgeon: Jerrell Belfast, MD;  Location: [place];  Service: ENT;  Laterality: Bilateral;   SINOSCOPY     TONSILLECTOMY     TUBAL LIGATION     VULVECTOMY N/A 12/24/2021   Procedure: WIDE EXCISION VULVECTOMY;  Surgeon: [name], MD;  Location: [facility];  Service: Gynecology;  Laterality: N/A;    Allergies: Oxycodone-acetaminophen  Medications: Prior to Admission medications   Medication Sig Start Date End Date Taking? Authorizing Provider  5-Hydroxytryptophan (5-HTP PO) Take 1 tablet by mouth daily.   Yes [provider]  acetaminophen (TYLENOL) 500 MG tablet Take 500 mg by mouth 2 (two) times daily.   Yes [provider]  albuterol (VENTOLIN HFA) 108 (90 Base) MCG/ACT inhaler Inhale 2 puffs into the lungs every 4 (four) hours as needed for wheezing or shortness of breath. 07/12/18  Yes [provider]  amLODipine (NORVASC) 5 MG tablet Take 5 mg by mouth daily.   Yes [provider]  azelastine (ASTELIN) 0.1 % nasal spray Place 2 sprays into both nostrils 2 (two) times daily. Use in each nostril as directed Patient taking differently: Place 1 spray into both nostrils every evening. Use in each nostril as directed 07/17/20  Yes Padgett, Rae Halsted, MD  baclofen (  LIORESAL) 20 MG tablet Take 20 mg by mouth See admin instructions. Take 20 mg daily, may take up to 3 times daily as needed for spasms 11/24/18  Yes [provider]  CINNAMON PO Take 1,000 mg by mouth in the morning and at bedtime.   Yes [provider]  diclofenac Sodium (VOLTAREN) 1 % GEL Apply 2 g topically 4 (four) times daily as needed (pain). 04/30/22  Yes [provider]  famotidine-calcium carbonate-magnesium hydroxide (PEPCID COMPLETE) 10-800-165 MG chewable  tablet Chew 1 tablet by mouth every evening.   Yes [provider]  fluticasone (FLONASE) 50 MCG/ACT nasal spray Place 1 spray into both nostrils daily.   Yes [provider]  fluticasone (FLOVENT HFA) 44 MCG/ACT inhaler TAKE 1 PUFF BY MOUTH EVERY DAY 01/23/22  Yes Padgett, Rae Halsted, MD  gabapentin (NEURONTIN) 400 MG capsule Take 400-800 mg by mouth See admin instructions. Take 400 mg in the morning and 800 mg at night   Yes [provider]  GLUCOSAMINE-CHONDROITIN PO Take 2 tablets by mouth daily.   Yes [provider]  hydrochlorothiazide (MICROZIDE) 12.5 MG capsule Take 12.5 mg by mouth daily.   Yes [provider]  imiquimod [place]) 5 % cream Apply topically every Monday, Tuesday, Wednesday, Thursday, and Friday. Use 5x a week (Mon-Fri) 04/23/22  Yes [name], MD  levocetirizine (XYZAL) 5 MG tablet TAKE 1 TABLET BY MOUTH EVERY EVENING Patient taking differently: Take 5 mg by mouth every other day. 07/24/21  Yes Padgett, Rae Halsted, MD  levothyroxine (SYNTHROID) 112 MCG tablet Take 112 mcg by mouth daily before breakfast.   Yes [provider]  losartan (COZAAR) 100 MG tablet Take 100 mg by mouth daily.   Yes [provider]  Melatonin 3 MG TABS Take 3 mg by mouth at bedtime.   Yes [provider]  meloxicam (MOBIC) 7.5 MG tablet Take 7.5 mg by mouth 2 (two) times daily as needed for pain. 02/27/22  Yes [provider]  metFORMIN (GLUCOPHAGE-XR) 500 MG 24 hr tablet Take 500 mg by mouth daily.   Yes [provider]  montelukast (SINGULAIR) 10 MG tablet Take 1 tablet (10 mg total) by mouth at bedtime. 01/22/22  Yes Padgett, Rae Halsted, MD  Multiple Vitamin (MULTIVITAMIN WITH MINERALS) TABS tablet Take 1 tablet by mouth at bedtime.   Yes [provider]  MYRBETRIQ 50 MG TB24 tablet Take 50 mg by mouth daily. 10/24/21  Yes [provider]  Semaglutide, 2 MG/DOSE,  (OZEMPIC, 2 MG/DOSE,) 8 MG/3ML SOPN Inject 2 mg into the skin every Wednesday.   Yes [provider]  sertraline (ZOLOFT) 50 MG tablet Take 50 mg by mouth daily. 05/15/20  Yes [provider]  simvastatin (ZOCOR) 20 MG tablet Take 20 mg by mouth at bedtime.    Yes [provider]  zolpidem (AMBIEN) 10 MG tablet Take 5 mg by mouth at bedtime as needed for sleep.    Yes [provider]  glucose blood test strip as directed finger stick once a day for 90 days 09/05/12   [provider]     Family History  Problem Relation Age of Onset   Allergic rhinitis Father    Allergic rhinitis Sister    Asthma Sister    Endometrial cancer Sister 21   Multiple myeloma Sister    Cancer Maternal Grandmother    Asthma Paternal Aunt    Angioedema Neg Hx    Eczema Neg Hx  Immunodeficiency Neg Hx    Urticaria Neg Hx    Ovarian cancer Neg Hx    Breast cancer Neg Hx    Colon cancer Neg Hx    Prostate cancer Neg Hx    Pancreatic cancer Neg Hx     Social History   Socioeconomic History   Marital status: Married    Spouse name: Not on file   Number of children: Not on file   Years of education: Not on file   Highest education level: Not on file  Occupational History   Occupation: retored  Tobacco Use   Smoking status: Never   Smokeless tobacco: Never  Vaping Use   Vaping Use: Never used  Substance and Sexual Activity   Alcohol use: Yes    Comment: 2 glasses of wine/month   Drug use: No   Sexual activity: Yes    Birth control/protection: Post-menopausal  Other Topics Concern   Not on file  Social History Narrative   Not on file   Social Determinants of Health   Financial Resource Strain: Not on file  Food Insecurity: Not on file  Transportation Needs: Not on file  Physical Activity: Not on file  Stress: Not on file  Social Connections: Not on file    ECOG Status: {CHL ONC ECOG ZE:[id number]}  Review of Systems: A 12 point ROS  discussed and pertinent positives are indicated in the HPI above.  All other systems are negative.  Review of Systems  Vital Signs: LMP 07/06/1998 (Approximate)   Advance Care Plan: {Advance Care UQJF:35456}    Physical Exam  Imaging: CT CHEST ABDOMEN PELVIS W CONTRAST  Result Date: 05/17/2022 CLINICAL DATA:  History of vulvar melanoma, follow-up. * Tracking Code: BO * EXAM: CT CHEST, ABDOMEN, AND PELVIS WITH CONTRAST TECHNIQUE: Multidetector CT imaging of the chest, abdomen and pelvis was performed following the standard protocol during bolus administration of intravenous contrast. RADIATION DOSE REDUCTION: This exam was performed according to the departmental dose-optimization program which includes automated exposure control, adjustment of the mA and/or kV according to patient size and/or use of iterative reconstruction technique. CONTRAST:  126m OMNIPAQUE IOHEXOL 300 MG/ML  SOLN COMPARISON:  Multiple priors including CT chest abdomen pelvis February 09, 2022 and PET-CT September 12, 2021. FINDINGS: CT CHEST FINDINGS Cardiovascular: Aortic atherosclerosis. No central pulmonary embolus on this nondedicated study. Coronary artery calcifications. Normal size heart. No significant pericardial effusion/thickening. Mediastinum/Nodes: No supraclavicular adenopathy. No suspicious thyroid nodule. No pathologically enlarged mediastinal, hilar or axillary lymph nodes. The esophagus is grossly unremarkable. Lungs/Pleura: No significant interval change of the bilateral pulmonary nodules. No new suspicious pulmonary nodules index nodules are as follows: -left lower lobe pulmonary nodule measures 5 mm on image 106/4, unchanged. -left upper lobe pulmonary nodule measures 6 mm on image 40/4 previously 8 mm. -right lower lobe pulmonary nodule measures 3 mm on image 110/4, unchanged -right lower lobe pulmonary nodule measures 4 mm on image 88/for, previously 5 mm. -right upper lobe pulmonary nodule measures 2 mm on image  88/4, unchanged. -no pleural effusion.  No pneumothorax. Musculoskeletal: No aggressive lytic or blastic lesion of bone. Multilevel degenerative changes spine. CT ABDOMEN PELVIS FINDINGS Hepatobiliary: Hepatic steatosis with focal fatty sparing along the gallbladder fossa. Stable 9 mm hypodensity in the left hepatic lobe on image 60/3 technically too small to accurately characterize. Cholelithiasis without findings of acute cholecystitis. No biliary ductal dilation. Pancreas: No pancreatic ductal dilation or evidence of acute inflammation. Spleen: No splenomegaly. Adrenals/Urinary Tract: Bilateral adrenal glands appear  normal. No hydronephrosis. Kidneys demonstrate symmetric enhancement and excretion of contrast material. Urinary bladder is unremarkable for degree of distension. Stomach/Bowel: Stomach is nondistended limiting evaluation. No pathologic dilation of small or large bowel. No evidence of acute bowel inflammation. Vascular/Lymphatic: Aortic atherosclerosis. Increased prominence of the left gonadal vein and pelvic collateral vessels. Interval increase in size of prominent left external iliac and inguinal lymph nodes including a 9 mm lymph node/soft tissue nodule along the left inguinal surgical clips measuring 9 mm on image 123/3 previously measuring 3 mm. Additional index left external iliac lymph node measures 9 mm in short axis on image 116/3 previously 6 mm. Reproductive: Uterine leiomyomas.  No suspicious adnexal mass. Other: No significant abdominopelvic free fluid. Musculoskeletal: No aggressive lytic or blastic lesion of bone. Levoconvex curvature of the lumbar spine with associated degenerative change. L5-S1 posterior spinal fusion hardware with interbody disc spacer. IMPRESSION: 1. Interval increase in size of prominent left external iliac and inguinal lymph nodes including 1 directly adjacent to the left inguinal surgical clips, nonspecific and possibly reactive but suspicious for nodal  metastatic disease. 2. Stable nonspecific bilateral pulmonary nodules, continued attention on follow-up imaging suggested. 3. Stable 9 mm hypodensity in the left hepatic lobe, technically too small to accurately characterize but distinctly not typical in appearance for that of a melanoma metastasis favored to reflect a cyst or hemangioma. Suggest continued attention on follow-up imaging. 4. Increased prominence of the left gonadal vein and pelvic collateral vessels, which can be seen in the setting of pelvic venous insufficiency. 5. Hepatic steatosis. 6. Cholelithiasis without findings of acute cholecystitis. 7.  Aortic Atherosclerosis (ICD10-I70.0). These results will be called to the ordering clinician or representative by the Radiologist Assistant, and communication documented in the PACS or [employer]. Electronically Signed   By: Dahlia Bailiff M.D.   On: 05/17/2022 08:45    Labs:  CBC: Recent Labs    12/24/21 1306  HGB 13.6  HCT 40.0    COAGS: No results for input(s): "INR", "APTT" in the last 8760 hours.  BMP: Recent Labs    12/24/21 1306 02/09/22 0816 05/15/22 0756  NA 141  --   --   K 3.7  --   --   CL 104  --   --   GLUCOSE 99  --   --   BUN 13  --   --   CREATININE 0.50 0.90 0.90      Assessment and Plan:  71 y.o. female  outpatient. History of  hypothyroidism. HTN. HLD, DM, GERD, vulvar melanoma. Found to have enlarging lymph nodes on recent follow up CT. Team is requesting biopsy. Of the left inguinal lymph node. Case reveiewed and IR Attending Dr. Wanda Plump. Mir. Axial series image 123.   CT CAP from 11.10.23 reads Interval increase in size of prominent left external iliac and inguinal lymph nodes including 1 directly adjacent to the left inguinal surgical clips, nonspecific and possibly reactive but suspicious for nodal metastatic disease.  *** All labs and medications are within acceptable parameters. Allergies include Percocet. Patient has been NPO since midight.    Thank you for this interesting consult.  I greatly enjoyed meeting [name] and look forward to participating in their care.  A copy of this report was sent to the requesting provider on this date.  Electronically Signed: Jacqualine Mau, NP 05/24/2022, 9:10 PM   I spent a total of {New RKYH:[id number]} {New Out-Pt:[id number]}  {Established Out-Pt:[id number]} in face to face in clinical  consultation, greater than 50% of which was counseling/coordinating care for ***

## 2022-05-25 ENCOUNTER — Other Ambulatory Visit: Payer: Self-pay | Admitting: Radiology

## 2022-05-25 DIAGNOSIS — C519 Malignant neoplasm of vulva, unspecified: Secondary | ICD-10-CM

## 2022-05-26 ENCOUNTER — Ambulatory Visit (HOSPITAL_COMMUNITY)
Admission: RE | Admit: 2022-05-26 | Discharge: 2022-05-26 | Disposition: A | Discharge status: Home / Self Care | Payer: PPO | Source: Ambulatory Visit | Attending: Gynecologic Oncology | Admitting: Gynecologic Oncology

## 2022-05-26 ENCOUNTER — Other Ambulatory Visit: Payer: Self-pay

## 2022-05-26 DIAGNOSIS — R591 Generalized enlarged lymph nodes: Secondary | ICD-10-CM | POA: Diagnosis not present

## 2022-05-26 DIAGNOSIS — Z7984 Long term (current) use of oral hypoglycemic drugs: Secondary | ICD-10-CM | POA: Insufficient documentation

## 2022-05-26 DIAGNOSIS — E785 Hyperlipidemia, unspecified: Secondary | ICD-10-CM | POA: Diagnosis not present

## 2022-05-26 DIAGNOSIS — I10 Essential (primary) hypertension: Secondary | ICD-10-CM | POA: Insufficient documentation

## 2022-05-26 DIAGNOSIS — C519 Malignant neoplasm of vulva, unspecified: Secondary | ICD-10-CM | POA: Diagnosis not present

## 2022-05-26 DIAGNOSIS — E119 Type 2 diabetes mellitus without complications: Secondary | ICD-10-CM | POA: Diagnosis not present

## 2022-05-26 DIAGNOSIS — Z8616 Personal history of COVID-19: Secondary | ICD-10-CM | POA: Insufficient documentation

## 2022-05-26 DIAGNOSIS — E039 Hypothyroidism, unspecified: Secondary | ICD-10-CM | POA: Diagnosis not present

## 2022-05-26 DIAGNOSIS — R590 Localized enlarged lymph nodes: Secondary | ICD-10-CM | POA: Diagnosis not present

## 2022-05-26 LAB — GLUCOSE, CAPILLARY
Glucose-Capillary: 120 mg/dL — ABNORMAL HIGH (ref 70–99)
Glucose-Capillary: 102 mg/dL — ABNORMAL HIGH (ref 70–99)

## 2022-05-26 LAB — CBC
HCT: 40.2 % (ref 36.0–46.0)
Hemoglobin: 13.3 g/dL (ref 12.0–15.0)
MCH: 27.8 pg (ref 26.0–34.0)
MCHC: 33.1 g/dL (ref 30.0–36.0)
MCV: 84.1 fL (ref 80.0–100.0)
Platelets: 267 10*3/uL (ref 150–400)
RBC: 4.78 MIL/uL (ref 3.87–5.11)
RDW: 14.5 % (ref 11.5–15.5)
WBC: 7.8 10*3/uL (ref 4.0–10.5)
nRBC: 0 % (ref 0.0–0.2)

## 2022-05-26 LAB — PROTIME-INR
INR: 1 (ref 0.8–1.2)
Prothrombin Time: 12.9 seconds (ref 11.4–15.2)

## 2022-05-26 MED ORDER — MIDAZOLAM HCL 2 MG/2ML IJ SOLN
INTRAMUSCULAR | Status: AC
  Filled 2022-05-26: qty 4

## 2022-05-26 MED ORDER — MIDAZOLAM HCL 2 MG/2ML IJ SOLN
INTRAMUSCULAR | Status: AC | PRN
  Administered 2022-05-26: 1 mg via INTRAVENOUS
  Administered 2022-05-26: .5 mg via INTRAVENOUS

## 2022-05-26 MED ORDER — FENTANYL CITRATE (PF) 100 MCG/2ML IJ SOLN
INTRAMUSCULAR | Status: AC
  Filled 2022-05-26: qty 4

## 2022-05-26 MED ORDER — SODIUM CHLORIDE 0.9 % IV SOLN: INTRAVENOUS | Status: DC

## 2022-05-26 MED ORDER — FENTANYL CITRATE (PF) 100 MCG/2ML IJ SOLN
INTRAMUSCULAR | Status: AC | PRN
  Administered 2022-05-26: 25 ug via INTRAVENOUS
  Administered 2022-05-26: 50 ug via INTRAVENOUS

## 2022-05-26 MED ORDER — LIDOCAINE HCL 1 % IJ SOLN: 10.0000 mL | Freq: Once | INTRAMUSCULAR | Status: DC

## 2022-05-26 NOTE — Procedures (Signed)
Pre Procedure Dx: History of valvular melanoma, now with left inguinal lymphadenpathy Post Procedural Dx: Same  Technically successful US guided biopsy of indeterminate left inguinal lymph node.   EBL: Trace No immediate complications.   Ronny Bacon, MD Pager #: [phone]

## 2022-05-26 NOTE — Progress Notes (Signed)
Pt and husband received d/c instructions, written and verbal. All questions and concerns addressed. PIV removed and intact. Procedure site remains clean and dressing (bandaid) remains intact. Denies any acute pain or discomfort. Pt in stable condition.

## 2022-05-27 ENCOUNTER — Other Ambulatory Visit: Payer: Self-pay | Admitting: Student

## 2022-05-27 DIAGNOSIS — Z8544 Personal history of malignant neoplasm of other female genital organs: Secondary | ICD-10-CM | POA: Diagnosis not present

## 2022-05-27 DIAGNOSIS — C519 Malignant neoplasm of vulva, unspecified: Secondary | ICD-10-CM

## 2022-05-27 DIAGNOSIS — R590 Localized enlarged lymph nodes: Secondary | ICD-10-CM | POA: Diagnosis not present

## 2022-06-02 DIAGNOSIS — H5201 Hypermetropia, right eye: Secondary | ICD-10-CM | POA: Diagnosis not present

## 2022-06-02 DIAGNOSIS — H5212 Myopia, left eye: Secondary | ICD-10-CM | POA: Diagnosis not present

## 2022-06-02 DIAGNOSIS — E119 Type 2 diabetes mellitus without complications: Secondary | ICD-10-CM | POA: Diagnosis not present

## 2022-06-03 LAB — SURGICAL PATHOLOGY

## 2022-06-04 ENCOUNTER — Telehealth: Payer: Self-pay | Admitting: Gynecologic Oncology

## 2022-06-04 ENCOUNTER — Other Ambulatory Visit (HOSPITAL_BASED_OUTPATIENT_CLINIC_OR_DEPARTMENT_OTHER): Payer: Self-pay

## 2022-06-04 DIAGNOSIS — C519 Malignant neoplasm of vulva, unspecified: Secondary | ICD-10-CM

## 2022-06-04 DIAGNOSIS — R599 Enlarged lymph nodes, unspecified: Secondary | ICD-10-CM

## 2022-06-04 MED ORDER — OZEMPIC (2 MG/DOSE) 8 MG/3ML ~~LOC~~ SOPN
2.0000 mg | PEN_INJECTOR | SUBCUTANEOUS | 3 refills | Status: DC
  Filled 2022-06-04: qty 3, 28d supply, fill #0
  Filled 2022-06-25: qty 3, 28d supply, fill #1
  Filled 2022-07-24 – 2022-07-25 (×2): qty 3, 28d supply, fill #2
  Filled 2022-08-26: qty 3, 28d supply, fill #3
  Filled 2022-09-18: qty 3, 28d supply, fill #4
  Filled 2022-10-16: qty 3, 28d supply, fill #5

## 2022-06-04 NOTE — Telephone Encounter (Signed)
Called patient to discuss recent biopsy results - lymph node negative for melanoma. Suspect prominent lymph nodes may be related to Aldara use (there are reports of this). Will plan to get repeat imaging approximately 4 weeks after stop Aldara.  Jeral Pinch MD Gynecologic Oncology

## 2022-06-05 DIAGNOSIS — M4722 Other spondylosis with radiculopathy, cervical region: Secondary | ICD-10-CM | POA: Diagnosis not present

## 2022-06-05 DIAGNOSIS — M5416 Radiculopathy, lumbar region: Secondary | ICD-10-CM | POA: Diagnosis not present

## 2022-06-09 ENCOUNTER — Ambulatory Visit: Payer: PPO | Attending: Orthopaedic Surgery

## 2022-06-09 ENCOUNTER — Other Ambulatory Visit: Payer: Self-pay

## 2022-06-09 DIAGNOSIS — R262 Difficulty in walking, not elsewhere classified: Secondary | ICD-10-CM

## 2022-06-09 DIAGNOSIS — M6281 Muscle weakness (generalized): Secondary | ICD-10-CM

## 2022-06-09 DIAGNOSIS — R252 Cramp and spasm: Secondary | ICD-10-CM

## 2022-06-09 DIAGNOSIS — M5416 Radiculopathy, lumbar region: Secondary | ICD-10-CM | POA: Insufficient documentation

## 2022-06-09 NOTE — Therapy (Signed)
OUTPATIENT PHYSICAL THERAPY THORACOLUMBAR EVALUATION   Patient Name: [name] MRN: [id number] DOB:1951-07-02, 71 y.o., female Today's Date: 06/09/2022  END OF SESSION:  PT End of Session - 06/09/22 1112     Visit Number 1    Date for PT Re-Evaluation 08/04/22    Authorization Type [employer]    PT Start Time 1105    PT Stop Time 1148    PT Time Calculation (min) 43 min    Activity Tolerance Patient tolerated treatment well    Behavior During Therapy WFL for tasks assessed/performed             Past Medical History:  Diagnosis Date   Asthma    has rescue inhaler, now on Flovent   Chronic back pain    Diabetes mellitus without complication ([place])    on medformin, on Ozempic   GERD (gastroesophageal reflux disease)    History of COVID-19 04/05/2021   Hyperlipemia    Hypertension    Hypothyroidism    Insomnia    OAB (overactive bladder)    Seasonal allergies    Sleep apnea    uses a cpap   Past Surgical History:  Procedure Laterality Date   ANKLE ARTHROTOMY  2003   left-fx   APPENDECTOMY     back fusion  02/14/2019   L4 back fusion   BACK SURGERY  2010   lumb disk   BREAST LUMPECTOMY WITH RADIOACTIVE SEED LOCALIZATION Left 06/13/2019   Procedure: LEFT BREAST LUMPECTOMY WITH RADIOACTIVE SEED LOCALIZATION;  Surgeon: Erroll Luna, MD;  Location: [place];  Service: General;  Laterality: Left;   CARPAL TUNNEL RELEASE     rt   CARPAL TUNNEL RELEASE Left 08/01/2013   Procedure: LEFT CARPAL TUNNEL RELEASE;  Surgeon: Wynonia Sours, MD;  Location: [place];  Service: Orthopedics;  Laterality: Left;   CESAREAN SECTION  83,86   COLONOSCOPY     DILATATION & CURETTAGE/HYSTEROSCOPY WITH MYOSURE N/A 08/23/2018   Procedure: DILATATION & CURETTAGE/HYSTEROSCOPY WITH MYOSURE POLYPECTOMY;  Surgeon: Nunzio Cobbs, MD;  Location: [facility];  Service: Gynecology;  Laterality: N/A;  fibroid resection    DILATION AND CURETTAGE OF UTERUS     x2   MASS EXCISION N/A 08/04/2017   Procedure: EXCISION LIPOMA UPPER BACK x2 AND NECK;  Surgeon: Erroll Luna, MD;  Location: [place];  Service: General;  Laterality: N/A;   NASAL SEPTOPLASTY W/ TURBINOPLASTY Bilateral 06/21/2015   Procedure: NASAL SEPTOPLASTY WITH BILATERAL TURBINATE REDUCTION;  Surgeon: Jerrell Belfast, MD;  Location: [place];  Service: ENT;  Laterality: Bilateral;   SINOSCOPY     TONSILLECTOMY     TUBAL LIGATION     VULVECTOMY N/A 12/24/2021   Procedure: WIDE EXCISION VULVECTOMY;  Surgeon: [name], MD;  Location[facility];  Service: Gynecology;  Laterality: N/A;   Patient Active Problem List   Diagnosis Date Noted   Hypothyroidism 01/22/2022   Insomnia 01/22/2022   Lipoma 01/22/2022   Major depression, single episode 01/22/2022   Morbid obesity ([place]) 01/22/2022   Overactive bladder 01/22/2022   Type 2 diabetes mellitus without complications ([place]) 51/88/4166   Allergic rhinitis due to pollen 01/22/2022   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[place]) 08/30/2021   BMI 37.0-37.9, adult 08/30/2021   Spondylolisthesis of lumbosacral region 02/14/2019   Mild persistent asthma without complication 01/03/1600   Mixed rhinitis 06/10/2018   Obstructive sleep apnea 12/11/2016   Palpitations 06/17/2016   Deflected  nasal septum 09/13/2015   Hypertrophy of nasal turbinates 09/13/2015   Deviated nasal septum 06/21/2015    Class: Chronic   HYPERLIPIDEMIA 11/18/2007   Essential hypertension 11/18/2007   Allergic rhinitis 11/18/2007   COUGH 11/18/2007    PCP: Saintclair Halsted, FNP   REFERRING PROVIDER: Starling Manns, MD  REFERRING DIAG: M54.16 (ICD-10-CM) - Radiculopathy, lumbar region  Rationale for Evaluation and Treatment: Rehabilitation  THERAPY DIAG:  M54.16 (ICD-10-CM) - Radiculopathy, lumbar region  ONSET DATE: 06/08/2022  SUBJECTIVE:                                                                                                                                                                                            SUBJECTIVE STATEMENT: Long history of low back pain with previous surgery (2010 with Dr. Carloyn Manner, then another surgery after she fell in 2019- surgery August of 2020 fusion)  She reports that she recovered from her first surgery pretty well but has recently  PERTINENT HISTORY:  Scheduled for surgery for other gynecological issue on 07/29/22  PAIN:  Are you having pain? Yes: NPRS scale: 2/10 Pain location: low back and right leg Pain description: aching and the leg starts burning Aggravating factors: driving Relieving factors: Tylenol, gabapentin, baclofen  PRECAUTIONS: None  WEIGHT BEARING RESTRICTIONS: No  FALLS:  Has patient fallen in last 6 months? No  LIVING ENVIRONMENT: Lives with: lives with their family Lives in: House/apartment  OCCUPATION: retired Marine scientist   PLOF: Independent, Independent with basic ADLs, Independent with household mobility without device, Independent with community mobility without device, Independent with gait, and Independent with transfers  PATIENT GOALS: To eliminate leg pain and avoid further problems with my back.   NEXT MD VISIT: as needed  OBJECTIVE:   DIAGNOSTIC FINDINGS:  none  PATIENT SURVEYS:  FOTO 56 (goal is 40)  SCREENING FOR RED FLAGS: Bowel or bladder incontinence: No Spinal tumors: No Cauda equina syndrome: No Compression fracture: No Abdominal aneurysm: No  COGNITION: Overall cognitive status: Within functional limits for tasks assessed     SENSATION: Light touch: Impaired   MUSCLE LENGTH: Hamstrings: Right 60 deg; Left 60 deg Thomas test: Right pos ; Left pos  POSTURE:  Acquired scoliosis lumbar  LUMBAR ROM: assess next visit  AROM eval  Flexion   Extension   Right lateral flexion   Left lateral flexion   Right rotation   Left rotation    (Blank rows = not tested)  LOWER EXTREMITY ROM:      WFL  LOWER EXTREMITY MMT:    Generally 4 to 4+/5 with exception  LUMBAR SPECIAL TESTS:  Straight leg  raise test: Negative  FUNCTIONAL TESTS:  5 times sit to stand: 14.67 Timed up and go (TUG): 9.52  GAIT: Distance walked: 50 Assistive device utilized: None Level of assistance: Complete Independence Comments: antalgic   TODAY'S TREATMENT:                                                                                                                              DATE: 06/09/22 Initial eval completed and initiated HEP    PATIENT EDUCATION:  Education details: Initiated HEP Person educated: Patient Education method: Consulting civil engineer, Media planner, Verbal cues, and Handouts Education comprehension: verbalized understanding, returned demonstration, and verbal cues required  HOME EXERCISE PROGRAM: Access Code: [email or web address] URL: [email or web address] Date: 06/09/2022 Prepared by: Candyce Churn  Exercises - Standing Hamstring Stretch on Chair  - 1 x daily - 7 x weekly - 1 sets - 3 reps - 30 hold - Standing Hip Flexor Stretch with Foot Elevated  - 1 x daily - 7 x weekly - 1 sets - 3 reps - 30 hold - Lying Prone  - 1 x daily - 7 x weekly - 1 sets - 1 reps - 2 min hold - Static Prone on Elbows  - 1 x daily - 7 x weekly - 1 sets - 1 reps - 2 min hold  ASSESSMENT:  CLINICAL IMPRESSION: Patient is a 71 y.o. female who was seen today for physical therapy evaluation and treatment for lumbar radiculopathy.  She presents with decreased/painful lumbar ROM, decreased strength and elevated pain.    OBJECTIVE IMPAIRMENTS: difficulty walking, decreased ROM, decreased strength, increased muscle spasms, impaired flexibility, impaired sensation, postural dysfunction, and pain.   ACTIVITY LIMITATIONS: carrying, lifting, bending, sitting, standing, squatting, sleeping, transfers, bathing, and dressing  PARTICIPATION LIMITATIONS: meal prep, cleaning, laundry, shopping, and community  activity  PERSONAL FACTORS: Fitness and 1 comorbidity: pending gynecological surgery scheduled in January  are also affecting patient's functional outcome.   REHAB POTENTIAL: Good  CLINICAL DECISION MAKING: Stable/uncomplicated  EVALUATION COMPLEXITY: Low   GOALS: Goals reviewed with patient? Yes  SHORT TERM GOALS: Target date: 07/07/2022    Pain report to be no greater than 4/10  Baseline: Goal status: INITIAL  2.  Patient will be independent with initial HEP  Baseline:  Goal status: INITIAL  3.  Patient to report 50% improvement in overall symptoms Baseline:  Goal status: INITIAL   LONG TERM GOALS: Target date: 08/04/2022    Patient to be independent with advanced HEP  Baseline:  Goal status: INITIAL  2.  Patient to report pain no greater than 2/10  Baseline:  Goal status: INITIAL  3.  Patient to report 85% improvement in overall symptoms  Baseline:  Goal status: INITIAL  4.  FOTO score to be 63 Baseline: 57 Goal status: INITIAL  5.  5 times sit to stand and TUG to improve by 3-5 sec Baseline:  Goal status: INITIAL  6.  Radicular symptoms to be centralized Baseline:  Goal  status: INITIAL  PLAN:  PT FREQUENCY: 1-2x/week  PT DURATION: 8 weeks  PLANNED INTERVENTIONS: Therapeutic exercises, Therapeutic activity, Neuromuscular re-education, Balance training, Gait training, Patient/Family education, Self Care, Joint mobilization, Stair training, Aquatic Therapy, Dry Needling, Electrical stimulation, Spinal mobilization, Cryotherapy, Moist heat, Taping, Traction, Ultrasound, Ionotophoresis '4mg'$ /ml Dexamethasone, Manual therapy, and Re-evaluation.  PLAN FOR NEXT SESSION: Review HEP, Nustep, begin core exercise,  assess effectiveness of Mckenzie extension   Isabel Caprice, PT 06/09/2022, 3:18 PM

## 2022-06-10 ENCOUNTER — Ambulatory Visit: Payer: PPO

## 2022-06-10 DIAGNOSIS — M5416 Radiculopathy, lumbar region: Secondary | ICD-10-CM | POA: Diagnosis not present

## 2022-06-10 DIAGNOSIS — R252 Cramp and spasm: Secondary | ICD-10-CM

## 2022-06-10 DIAGNOSIS — R262 Difficulty in walking, not elsewhere classified: Secondary | ICD-10-CM

## 2022-06-10 DIAGNOSIS — M5412 Radiculopathy, cervical region: Secondary | ICD-10-CM

## 2022-06-10 DIAGNOSIS — R293 Abnormal posture: Secondary | ICD-10-CM

## 2022-06-10 DIAGNOSIS — M6281 Muscle weakness (generalized): Secondary | ICD-10-CM

## 2022-06-10 NOTE — Therapy (Signed)
OUTPATIENT PHYSICAL THERAPY THORACOLUMBAR TREATMENT NOTE   Patient Name: [name] MRN: [id number] DOB:16-Nov-1950, 71 y.o., female Today's Date: 06/10/2022  END OF SESSION:  PT End of Session - 06/10/22 0935     Visit Number 2    Date for PT Re-Evaluation 08/04/22    Authorization Type [employer]    PT Start Time 0932    PT Stop Time 1015    PT Time Calculation (min) 43 min    Activity Tolerance Patient tolerated treatment well    Behavior During Therapy WFL for tasks assessed/performed             Past Medical History:  Diagnosis Date   Asthma    has rescue inhaler, now on Flovent   Chronic back pain    Diabetes mellitus without complication ([place])    on medformin, on Ozempic   GERD (gastroesophageal reflux disease)    History of COVID-19 04/05/2021   Hyperlipemia    Hypertension    Hypothyroidism    Insomnia    OAB (overactive bladder)    Seasonal allergies    Sleep apnea    uses a cpap   Past Surgical History:  Procedure Laterality Date   ANKLE ARTHROTOMY  2003   left-fx   APPENDECTOMY     back fusion  02/14/2019   L4 back fusion   BACK SURGERY  2010   lumb disk   BREAST LUMPECTOMY WITH RADIOACTIVE SEED LOCALIZATION Left 06/13/2019   Procedure: LEFT BREAST LUMPECTOMY WITH RADIOACTIVE SEED LOCALIZATION;  Surgeon: Erroll Luna, MD;  Location: [place];  Service: General;  Laterality: Left;   CARPAL TUNNEL RELEASE     rt   CARPAL TUNNEL RELEASE Left 08/01/2013   Procedure: LEFT CARPAL TUNNEL RELEASE;  Surgeon: Wynonia Sours, MD;  Location: [place];  Service: Orthopedics;  Laterality: Left;   CESAREAN SECTION  83,86   COLONOSCOPY     DILATATION & CURETTAGE/HYSTEROSCOPY WITH MYOSURE N/A 08/23/2018   Procedure: DILATATION & CURETTAGE/HYSTEROSCOPY WITH MYOSURE POLYPECTOMY;  Surgeon: Nunzio Cobbs, MD;  Location: [facility];  Service: Gynecology;  Laterality: N/A;  fibroid resection    DILATION AND CURETTAGE OF UTERUS     x2   MASS EXCISION N/A 08/04/2017   Procedure: EXCISION LIPOMA UPPER BACK x2 AND NECK;  Surgeon: Erroll Luna, MD;  Location: [place];  Service: General;  Laterality: N/A;   NASAL SEPTOPLASTY W/ TURBINOPLASTY Bilateral 06/21/2015   Procedure: NASAL SEPTOPLASTY WITH BILATERAL TURBINATE REDUCTION;  Surgeon: Jerrell Belfast, MD;  Location: [place];  Service: ENT;  Laterality: Bilateral;   SINOSCOPY     TONSILLECTOMY     TUBAL LIGATION     VULVECTOMY N/A 12/24/2021   Procedure: WIDE EXCISION VULVECTOMY;  Surgeon: [name], MD;  Location: [facility];  Service: Gynecology;  Laterality: N/A;   Patient Active Problem List   Diagnosis Date Noted   Hypothyroidism 01/22/2022   Insomnia 01/22/2022   Lipoma 01/22/2022   Major depression, single episode 01/22/2022   Morbid obesity ([place]) 01/22/2022   Overactive bladder 01/22/2022   Type 2 diabetes mellitus without complications ([place]) 67/89/3810   Allergic rhinitis due to pollen 01/22/2022   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[place]) 08/30/2021   BMI 37.0-37.9, adult 08/30/2021   Spondylolisthesis of lumbosacral region 02/14/2019   Mild persistent asthma without complication 17/51/0258   Mixed rhinitis 06/10/2018   Obstructive sleep apnea 12/11/2016   Palpitations 06/17/2016  Deflected nasal septum 09/13/2015   Hypertrophy of nasal turbinates 09/13/2015   Deviated nasal septum 06/21/2015    Class: Chronic   HYPERLIPIDEMIA 11/18/2007   Essential hypertension 11/18/2007   Allergic rhinitis 11/18/2007   COUGH 11/18/2007    PCP: Saintclair Halsted, FNP   REFERRING PROVIDER: Starling Manns, MD  REFERRING DIAG: M54.16 (ICD-10-CM) - Radiculopathy, lumbar region  Rationale for Evaluation and Treatment: Rehabilitation  THERAPY DIAG:  M54.16 (ICD-10-CM) - Radiculopathy, lumbar region  ONSET DATE: 06/08/2022  SUBJECTIVE:                                                                                                                                                                                            SUBJECTIVE STATEMENT: Patient reports she did her exercises last night and did ok but her leg pain is bad today.  She rates this a 6/10 today.    EVAL: Long history of low back pain with previous surgery (2010 with Dr. Carloyn Manner, then another surgery after she fell in 2019- surgery August of 2020 fusion)  She reports that she recovered from her first surgery pretty well but has recently started having burning in the right leg.  She is a retired Marine scientist.  She enjoys spending time with her grandkids and being with family.  She does not do any exercise regularly.  She is able to do most routine activity but has pain in the leg and has to sit and rest frequently to reduce the pain.  She states most of her symptoms occur when driving or sitting in a chair with no back support.    PERTINENT HISTORY:  Scheduled for surgery for other gynecological issue on 07/29/22  PAIN:  Are you having pain? Yes: NPRS scale: 6/10 Pain location: low back and right leg Pain description: aching and the leg starts burning Aggravating factors: driving Relieving factors: Tylenol, gabapentin, baclofen  PRECAUTIONS: None  WEIGHT BEARING RESTRICTIONS: No  FALLS:  Has patient fallen in last 6 months? No  LIVING ENVIRONMENT: Lives with: lives with their family Lives in: House/apartment  OCCUPATION: retired Marine scientist   PLOF: Independent, Independent with basic ADLs, Independent with household mobility without device, Independent with community mobility without device, Independent with gait, and Independent with transfers  PATIENT GOALS: To eliminate leg pain and avoid further problems with my back.   NEXT MD VISIT: as needed  OBJECTIVE:   DIAGNOSTIC FINDINGS:  none  PATIENT SURVEYS:  FOTO 56 (goal is 80)  SCREENING FOR RED FLAGS: Bowel or bladder incontinence: No Spinal tumors: No Cauda equina  syndrome: No Compression fracture: No Abdominal aneurysm: No  COGNITION:  Overall cognitive status: Within functional limits for tasks assessed     SENSATION: Light touch: Impaired   MUSCLE LENGTH: Hamstrings: Right 60 deg; Left 60 deg Thomas test: Right pos ; Left pos  POSTURE:  Acquired scoliosis lumbar  LUMBAR ROM: assess next visit  AROM eval  Flexion   Extension   Right lateral flexion   Left lateral flexion   Right rotation   Left rotation    (Blank rows = not tested)  LOWER EXTREMITY ROM:     WFL  LOWER EXTREMITY MMT:    Generally 4 to 4+/5 with exception  LUMBAR SPECIAL TESTS:  Straight leg raise test: Negative  FUNCTIONAL TESTS:  5 times sit to stand: 14.67 Timed up and go (TUG): 9.52  GAIT: Distance walked: 50 Assistive device utilized: None Level of assistance: Complete Independence Comments: antalgic   TODAY'S TREATMENT:                                                                                                                              DATE: 06/10/22 Recumbent bike x 5 min level 2 Prone lying x 2 min  Prone on elbows x 2 min Prone press up x 10 reps Reviewed HEP: standing hamstring stretch x 2 hold 30 sec each, standing quad stretch 2 x 30 sec  Added seated piriformis stretch 2 x 30 sec each Trigger Point Dry-Needling  Treatment instructions: Expect mild to moderate muscle soreness. S/S of pneumothorax if dry needled over a lung field, and to seek immediate medical attention should they occur. Patient verbalized understanding of these instructions and education.  Patient Consent Given: Yes Education handout provided: Yes Muscles treated: lumbar multifidi, iliocostalis lumborum Electrical stimulation performed: No Parameters: N/A Treatment response/outcome: Manual techniques used to identify tight muscle bands and trigger points in the low back.  Once identified, these muscles were treated.  Patient experienced muscle twitch and there  was palpable elongation of affected muscles tissue.  Patient reports decreased pain following treatment.    DATE: 06/09/22 Initial eval completed and initiated HEP    PATIENT EDUCATION:  Education details: Initiated HEP Person educated: Patient Education method: Consulting civil engineer, Media planner, Verbal cues, and Handouts Education comprehension: verbalized understanding, returned demonstration, and verbal cues required  HOME EXERCISE PROGRAM: Access Code: [email or web address] URL: [email or web address] Date: 06/10/2022 Prepared by: Candyce Churn  Exercises - Standing Hamstring Stretch on Chair  - 1 x daily - 7 x weekly - 1 sets - 3 reps - 30 hold - Standing Hip Flexor Stretch with Foot Elevated  - 1 x daily - 7 x weekly - 1 sets - 3 reps - 30 hold - Lying Prone  - 1 x daily - 7 x weekly - 1 sets - 1 reps - 2 min hold - Static Prone on Elbows  - 1 x daily - 7 x weekly - 1 sets - 1 reps - 2 min hold - Prone Press Up  - 1 x daily -  7 x weekly - 1 sets - 10 reps - Seated Piriformis Stretch with Trunk Bend  - 1 x daily - 7 x weekly - 1 sets - 3 reps - 30 hold ASSESSMENT:  CLINICAL IMPRESSION: Gerica had good results with dry needling today.  She also had immediate relief of the radicular symptoms in the left right LE with McKenzie extension progression.  We added piriformis stretch and prone press up to HEP.  She would benefit from continued skilled PT to centralize radicular symptoms and restore core strength and flexibility to avoid recurrence.   OBJECTIVE IMPAIRMENTS: difficulty walking, decreased ROM, decreased strength, increased muscle spasms, impaired flexibility, impaired sensation, postural dysfunction, and pain.   ACTIVITY LIMITATIONS: carrying, lifting, bending, sitting, standing, squatting, sleeping, transfers, bathing, and dressing  PARTICIPATION LIMITATIONS: meal prep, cleaning, laundry, shopping, and community activity  PERSONAL FACTORS: Fitness and 1 comorbidity: pending  gynecological surgery scheduled in January  are also affecting patient's functional outcome.   REHAB POTENTIAL: Good  CLINICAL DECISION MAKING: Stable/uncomplicated  EVALUATION COMPLEXITY: Low   GOALS: Goals reviewed with patient? Yes  SHORT TERM GOALS: Target date: 07/07/2022    Pain report to be no greater than 4/10  Baseline: Goal status: INITIAL  2.  Patient will be independent with initial HEP  Baseline:  Goal status: INITIAL  3.  Patient to report 50% improvement in overall symptoms Baseline:  Goal status: INITIAL   LONG TERM GOALS: Target date: 08/04/2022    Patient to be independent with advanced HEP  Baseline:  Goal status: INITIAL  2.  Patient to report pain no greater than 2/10  Baseline:  Goal status: INITIAL  3.  Patient to report 85% improvement in overall symptoms  Baseline:  Goal status: INITIAL  4.  FOTO score to be 63 Baseline: 57 Goal status: INITIAL  5.  5 times sit to stand and TUG to improve by 3-5 sec Baseline:  Goal status: INITIAL  6.  Radicular symptoms to be centralized Baseline:  Goal status: INITIAL  PLAN:  PT FREQUENCY: 1-2x/week  PT DURATION: 8 weeks  PLANNED INTERVENTIONS: Therapeutic exercises, Therapeutic activity, Neuromuscular re-education, Balance training, Gait training, Patient/Family education, Self Care, Joint mobilization, Stair training, Aquatic Therapy, Dry Needling, Electrical stimulation, Spinal mobilization, Cryotherapy, Moist heat, Taping, Traction, Ultrasound, Ionotophoresis '4mg'$ /ml Dexamethasone, Manual therapy, and Re-evaluation.  PLAN FOR NEXT SESSION: Review HEP, Nustep, begin core exercise,  assess effectiveness of Mckenzie extension   [place] B. Mishell Donalson, PT 06/10/22 11:34 AM  [facility] [address] [place], [place] [address] Phone # [phone] Fax [phone]

## 2022-06-12 ENCOUNTER — Telehealth: Payer: Self-pay | Admitting: *Deleted

## 2022-06-12 NOTE — Telephone Encounter (Signed)
Pre Dr Berline Lopes, patient scheduled for a CT 2 weeks after her surgery. Patient aware of date/time/instructions for the CT on 1/22

## 2022-06-15 ENCOUNTER — Other Ambulatory Visit (HOSPITAL_BASED_OUTPATIENT_CLINIC_OR_DEPARTMENT_OTHER): Payer: Self-pay

## 2022-06-15 ENCOUNTER — Encounter: Payer: Self-pay | Admitting: Rehabilitative and Restorative Service Providers"

## 2022-06-15 ENCOUNTER — Ambulatory Visit: Payer: PPO | Admitting: Rehabilitative and Restorative Service Providers"

## 2022-06-15 DIAGNOSIS — R262 Difficulty in walking, not elsewhere classified: Secondary | ICD-10-CM

## 2022-06-15 DIAGNOSIS — M6281 Muscle weakness (generalized): Secondary | ICD-10-CM

## 2022-06-15 DIAGNOSIS — R252 Cramp and spasm: Secondary | ICD-10-CM

## 2022-06-15 DIAGNOSIS — M5416 Radiculopathy, lumbar region: Secondary | ICD-10-CM

## 2022-06-15 NOTE — Therapy (Signed)
OUTPATIENT PHYSICAL THERAPY TREATMENT NOTE   Patient Name: [name] MRN: [id number] DOB:24-Oct-1950, 71 y.o., female Today's Date: 06/15/2022  END OF SESSION:  PT End of Session - 06/15/22 1534     Visit Number 3    Date for PT Re-Evaluation 08/04/22    Authorization Type [employer]    PT Start Time 1530    PT Stop Time 1610    PT Time Calculation (min) 40 min    Activity Tolerance Patient tolerated treatment well    Behavior During Therapy WFL for tasks assessed/performed             Past Medical History:  Diagnosis Date   Asthma    has rescue inhaler, now on Flovent   Chronic back pain    Diabetes mellitus without complication ([place])    on medformin, on Ozempic   GERD (gastroesophageal reflux disease)    History of COVID-19 04/05/2021   Hyperlipemia    Hypertension    Hypothyroidism    Insomnia    OAB (overactive bladder)    Seasonal allergies    Sleep apnea    uses a cpap   Past Surgical History:  Procedure Laterality Date   ANKLE ARTHROTOMY  2003   left-fx   APPENDECTOMY     back fusion  02/14/2019   L4 back fusion   BACK SURGERY  2010   lumb disk   BREAST LUMPECTOMY WITH RADIOACTIVE SEED LOCALIZATION Left 06/13/2019   Procedure: LEFT BREAST LUMPECTOMY WITH RADIOACTIVE SEED LOCALIZATION;  Surgeon: Erroll Luna, MD;  Location: [place];  Service: General;  Laterality: Left;   CARPAL TUNNEL RELEASE     rt   CARPAL TUNNEL RELEASE Left 08/01/2013   Procedure: LEFT CARPAL TUNNEL RELEASE;  Surgeon: Wynonia Sours, MD;  Location: [place];  Service: Orthopedics;  Laterality: Left;   CESAREAN SECTION  83,86   COLONOSCOPY     DILATATION & CURETTAGE/HYSTEROSCOPY WITH MYOSURE N/A 08/23/2018   Procedure: DILATATION & CURETTAGE/HYSTEROSCOPY WITH MYOSURE POLYPECTOMY;  Surgeon: Nunzio Cobbs, MD;  Location: [facility];  Service: Gynecology;  Laterality: N/A;  fibroid resection   DILATION AND  CURETTAGE OF UTERUS     x2   MASS EXCISION N/A 08/04/2017   Procedure: EXCISION LIPOMA UPPER BACK x2 AND NECK;  Surgeon: Erroll Luna, MD;  Location: [place];  Service: General;  Laterality: N/A;   NASAL SEPTOPLASTY W/ TURBINOPLASTY Bilateral 06/21/2015   Procedure: NASAL SEPTOPLASTY WITH BILATERAL TURBINATE REDUCTION;  Surgeon: Jerrell Belfast, MD;  Location: [place];  Service: ENT;  Laterality: Bilateral;   SINOSCOPY     TONSILLECTOMY     TUBAL LIGATION     VULVECTOMY N/A 12/24/2021   Procedure: WIDE EXCISION VULVECTOMY;  Surgeon: [name], MD;  Location: [facility];  Service: Gynecology;  Laterality: N/A;   Patient Active Problem List   Diagnosis Date Noted   Hypothyroidism 01/22/2022   Insomnia 01/22/2022   Lipoma 01/22/2022   Major depression, single episode 01/22/2022   Morbid obesity ([place]) 01/22/2022   Overactive bladder 01/22/2022   Type 2 diabetes mellitus without complications ([place]) 41/32/4401   Allergic rhinitis due to pollen 01/22/2022   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[place]) 08/30/2021   BMI 37.0-37.9, adult 08/30/2021   Spondylolisthesis of lumbosacral region 02/14/2019   Mild persistent asthma without complication 02/72/5366   Mixed rhinitis 06/10/2018   Obstructive sleep apnea 12/11/2016   Palpitations 06/17/2016   Deflected  nasal septum 09/13/2015   Hypertrophy of nasal turbinates 09/13/2015   Deviated nasal septum 06/21/2015    Class: Chronic   HYPERLIPIDEMIA 11/18/2007   Essential hypertension 11/18/2007   Allergic rhinitis 11/18/2007   COUGH 11/18/2007    PCP: Saintclair Halsted, FNP   REFERRING PROVIDER: Starling Manns, MD  REFERRING DIAG: M54.16 (ICD-10-CM) - Radiculopathy, lumbar region  Rationale for Evaluation and Treatment: Rehabilitation  THERAPY DIAG:  M54.16 (ICD-10-CM) - Radiculopathy, lumbar region  ONSET DATE: 06/08/2022  SUBJECTIVE:                                                                                                                                                                                            SUBJECTIVE STATEMENT: Pt reports that she is feeling much better today and driving did not bother her as much as it had been.  PERTINENT HISTORY:  Scheduled for surgery for other gynecological issue on 07/29/22  PAIN:  Are you having pain? Yes: NPRS scale: 2/10 Pain location: low back and right leg Pain description: aching and the leg starts burning Aggravating factors: driving Relieving factors: Tylenol, gabapentin, baclofen  PRECAUTIONS: None  WEIGHT BEARING RESTRICTIONS: No  FALLS:  Has patient fallen in last 6 months? No  LIVING ENVIRONMENT: Lives with: lives with their family Lives in: House/apartment  OCCUPATION: retired Marine scientist   PLOF: Independent, Independent with basic ADLs, Independent with household mobility without device, Independent with community mobility without device, Independent with gait, and Independent with transfers  PATIENT GOALS: To eliminate leg pain and avoid further problems with my back.   NEXT MD VISIT: as needed  OBJECTIVE:   DIAGNOSTIC FINDINGS:  none  PATIENT SURVEYS:  FOTO 56 (goal is 36)  SCREENING FOR RED FLAGS: Bowel or bladder incontinence: No Spinal tumors: No Cauda equina syndrome: No Compression fracture: No Abdominal aneurysm: No  COGNITION: Overall cognitive status: Within functional limits for tasks assessed     SENSATION: Light touch: Impaired   MUSCLE LENGTH: Hamstrings: Right 60 deg; Left 60 deg Thomas test: Right pos ; Left pos  POSTURE:  Acquired scoliosis lumbar   LOWER EXTREMITY ROM:     WFL  LOWER EXTREMITY MMT:    Generally 4 to 4+/5 with exception  LUMBAR SPECIAL TESTS:  Straight leg raise test: Negative  FUNCTIONAL TESTS:  Eval: 5 times sit to stand: 14.67 Timed up and go (TUG): 9.52  GAIT: Distance walked: 50 Assistive device utilized: None Level of assistance: Complete  Independence Comments: antalgic   TODAY'S TREATMENT:  DATE: 06/15/2022 Nustep level 3 x6 min with PT present to discuss status Seated hamstring stretch 2x20 sec Seated piriformis stretch 2x20 sec Prone on elbows x 2 min Prone press ups 2x10 Prone hip extension 2x10 bilat Sit to/from stand holding 4# dumbbell 2x10 Seated modified dead lift with 4# 2x10 Trigger Point Dry-Needling  Treatment instructions: Expect mild to moderate muscle soreness. S/S of pneumothorax if dry needled over a lung field, and to seek immediate medical attention should they occur. Patient verbalized understanding of these instructions and education. Patient Consent Given: Yes Education handout provided: Yes Muscles treated: lumbar multifidi Electrical stimulation performed: No Parameters: N/A Treatment response/outcome: Manual techniques used to identify tight muscle bands and trigger points in the low back.  Once identified, these muscles were treated.  Patient experienced muscle twitch and there was palpable elongation of affected muscles tissue.  Patient reports decreased pain following treatment.   Manual Therapy:  Soft tissue mobilization to encourage further tissue elongation following dry needling.  DATE: 06/10/22 Recumbent bike x 5 min level 2 Prone lying x 2 min  Prone on elbows x 2 min Prone press up x 10 reps Reviewed HEP: standing hamstring stretch x 2 hold 30 sec each, standing quad stretch 2 x 30 sec  Added seated piriformis stretch 2 x 30 sec each Trigger Point Dry-Needling  Treatment instructions: Expect mild to moderate muscle soreness. S/S of pneumothorax if dry needled over a lung field, and to seek immediate medical attention should they occur. Patient verbalized understanding of these instructions and education. Patient Consent Given: Yes Education handout provided:  Yes Muscles treated: lumbar multifidi, iliocostalis lumborum Electrical stimulation performed: No Parameters: N/A Treatment response/outcome: Manual techniques used to identify tight muscle bands and trigger points in the low back.  Once identified, these muscles were treated.  Patient experienced muscle twitch and there was palpable elongation of affected muscles tissue.  Patient reports decreased pain following treatment.    DATE: 06/09/22 Initial eval completed and initiated HEP    PATIENT EDUCATION:  Education details: Initiated HEP Person educated: Patient Education method: Consulting civil engineer, Media planner, Verbal cues, and Handouts Education comprehension: verbalized understanding, returned demonstration, and verbal cues required  HOME EXERCISE PROGRAM: Access Code: [email or web address] URL: [email or web address] Date: 06/10/2022 Prepared by: Candyce Churn  Exercises - Standing Hamstring Stretch on Chair  - 1 x daily - 7 x weekly - 1 sets - 3 reps - 30 hold - Standing Hip Flexor Stretch with Foot Elevated  - 1 x daily - 7 x weekly - 1 sets - 3 reps - 30 hold - Lying Prone  - 1 x daily - 7 x weekly - 1 sets - 1 reps - 2 min hold - Static Prone on Elbows  - 1 x daily - 7 x weekly - 1 sets - 1 reps - 2 min hold - Prone Press Up  - 1 x daily - 7 x weekly - 1 sets - 10 reps - Seated Piriformis Stretch with Trunk Bend  - 1 x daily - 7 x weekly - 1 sets - 3 reps - 30 hold  ASSESSMENT:  CLINICAL IMPRESSION: Myrl continues with overall decreased pain reported today.  Patient overall has decreased pain noted today and continues to report relief with stretching.  Patient requested dry needling to continue to progress with decreased muscle spasms.  Patient with symptoms worse on right side of lumbar than left.  Patient reports decreased pain following dry needling and manual therapy.  Patient with good response to  therapy treatment session and is on track to meet all goals.  OBJECTIVE  IMPAIRMENTS: difficulty walking, decreased ROM, decreased strength, increased muscle spasms, impaired flexibility, impaired sensation, postural dysfunction, and pain.   ACTIVITY LIMITATIONS: carrying, lifting, bending, sitting, standing, squatting, sleeping, transfers, bathing, and dressing  PARTICIPATION LIMITATIONS: meal prep, cleaning, laundry, shopping, and community activity  PERSONAL FACTORS: Fitness and 1 comorbidity: pending gynecological surgery scheduled in January  are also affecting patient's functional outcome.   REHAB POTENTIAL: Good  CLINICAL DECISION MAKING: Stable/uncomplicated  EVALUATION COMPLEXITY: Low   GOALS: Goals reviewed with patient? Yes  SHORT TERM GOALS: Target date: 07/07/2022    Pain report to be no greater than 4/10  Baseline: Goal status: IN PROGRESS  2.  Patient will be independent with initial HEP  Baseline:  Goal status: MET  3.  Patient to report 50% improvement in overall symptoms Baseline:  Goal status: INITIAL   LONG TERM GOALS: Target date: 08/04/2022    Patient to be independent with advanced HEP  Baseline:  Goal status: INITIAL  2.  Patient to report pain no greater than 2/10  Baseline:  Goal status: INITIAL  3.  Patient to report 85% improvement in overall symptoms  Baseline:  Goal status: INITIAL  4.  FOTO score to be 63 Baseline: 57 Goal status: INITIAL  5.  5 times sit to stand and TUG to improve by 3-5 sec Baseline:  Goal status: INITIAL  6.  Radicular symptoms to be centralized Baseline:  Goal status: INITIAL  PLAN:  PT FREQUENCY: 1-2x/week  PT DURATION: 8 weeks  PLANNED INTERVENTIONS: Therapeutic exercises, Therapeutic activity, Neuromuscular re-education, Balance training, Gait training, Patient/Family education, Self Care, Joint mobilization, Stair training, Aquatic Therapy, Dry Needling, Electrical stimulation, Spinal mobilization, Cryotherapy, Moist heat, Taping, Traction, Ultrasound, Ionotophoresis  33m/ml Dexamethasone, Manual therapy, and Re-evaluation.  PLAN FOR NEXT SESSION: Review HEP, Nustep, begin core exercise, manual therapy/dry needling as indicated   SJuel Burrow PT 06/15/22 4:16 PM   [facility] [address] [place] [address]Phone # [phone] [phone]

## 2022-06-17 ENCOUNTER — Ambulatory Visit: Payer: PPO

## 2022-06-17 DIAGNOSIS — R252 Cramp and spasm: Secondary | ICD-10-CM

## 2022-06-17 DIAGNOSIS — R293 Abnormal posture: Secondary | ICD-10-CM

## 2022-06-17 DIAGNOSIS — M6281 Muscle weakness (generalized): Secondary | ICD-10-CM

## 2022-06-17 DIAGNOSIS — R262 Difficulty in walking, not elsewhere classified: Secondary | ICD-10-CM

## 2022-06-17 DIAGNOSIS — M5416 Radiculopathy, lumbar region: Secondary | ICD-10-CM | POA: Diagnosis not present

## 2022-06-17 NOTE — Therapy (Signed)
OUTPATIENT PHYSICAL THERAPY TREATMENT NOTE   Patient Name: [name] MRN: [id number] DOB:[birth year], 71 y.o., female Today's Date: 06/17/2022  END OF SESSION:  PT End of Session - 06/17/22 1020     Visit Number 4    Date for PT Re-Evaluation 08/04/22    Authorization Type [employer]    PT Start Time 0930    PT Stop Time 1014    PT Time Calculation (min) 44 min    Activity Tolerance Patient tolerated treatment well    Behavior During Therapy WFL for tasks assessed/performed              Past Medical History:  Diagnosis Date   Asthma    has rescue inhaler, now on Flovent   Chronic back pain    Diabetes mellitus without complication ([place])    on medformin, on Ozempic   GERD (gastroesophageal reflux disease)    History of COVID-19 04/05/2021   Hyperlipemia    Hypertension    Hypothyroidism    Insomnia    OAB (overactive bladder)    Seasonal allergies    Sleep apnea    uses a cpap   Past Surgical History:  Procedure Laterality Date   ANKLE ARTHROTOMY  2003   left-fx   APPENDECTOMY     back fusion  02/14/2019   L4 back fusion   BACK SURGERY  2010   lumb disk   BREAST LUMPECTOMY WITH RADIOACTIVE SEED LOCALIZATION Left 06/13/2019   Procedure: LEFT BREAST LUMPECTOMY WITH RADIOACTIVE SEED LOCALIZATION;  Surgeon: Erroll Luna, MD;  Location: [place];  Service: General;  Laterality: Left;   CARPAL TUNNEL RELEASE     rt   CARPAL TUNNEL RELEASE Left 08/01/2013   Procedure: LEFT CARPAL TUNNEL RELEASE;  Surgeon: Wynonia Sours, MD;  Location: [place];  Service: Orthopedics;  Laterality: Left;   CESAREAN SECTION  83,86   COLONOSCOPY     DILATATION & CURETTAGE/HYSTEROSCOPY WITH MYOSURE N/A 08/23/2018   Procedure: DILATATION & CURETTAGE/HYSTEROSCOPY WITH MYOSURE POLYPECTOMY;  Surgeon: Nunzio Cobbs, MD;  Location: [facility];  Service: Gynecology;  Laterality: N/A;  fibroid resection   DILATION  AND CURETTAGE OF UTERUS     x2   MASS EXCISION N/A 08/04/2017   Procedure: EXCISION LIPOMA UPPER BACK x2 AND NECK;  Surgeon: Erroll Luna, MD;  Location: [place];  Service: General;  Laterality: N/A;   NASAL SEPTOPLASTY W/ TURBINOPLASTY Bilateral 06/21/2015   Procedure: NASAL SEPTOPLASTY WITH BILATERAL TURBINATE REDUCTION;  Surgeon: Jerrell Belfast, MD;  Location: [place];  Service: ENT;  Laterality: Bilateral;   SINOSCOPY     TONSILLECTOMY     TUBAL LIGATION     VULVECTOMY N/A 12/24/2021   Procedure: WIDE EXCISION VULVECTOMY;  Surgeon: [name], MD;  Location: [facility];  Service: Gynecology;  Laterality: N/A;   Patient Active Problem List   Diagnosis Date Noted   Hypothyroidism 01/22/2022   Insomnia 01/22/2022   Lipoma 01/22/2022   Major depression, single episode 01/22/2022   Morbid obesity ([place]) 01/22/2022   Overactive bladder 01/22/2022   Type 2 diabetes mellitus without complications ([place]) 23/55/7322   Allergic rhinitis due to pollen 01/22/2022   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[place]) 08/30/2021   BMI 37.0-37.9, adult 08/30/2021   Spondylolisthesis of lumbosacral region 02/14/2019   Mild persistent asthma without complication 02/54/2706   Mixed rhinitis 06/10/2018   Obstructive sleep apnea 12/11/2016   Palpitations 06/17/2016  Deflected nasal septum 09/13/2015   Hypertrophy of nasal turbinates 09/13/2015   Deviated nasal septum 06/21/2015    Class: Chronic   HYPERLIPIDEMIA 11/18/2007   Essential hypertension 11/18/2007   Allergic rhinitis 11/18/2007   COUGH 11/18/2007    PCP: Saintclair Halsted, FNP   REFERRING PROVIDER: Starling Manns, MD  REFERRING DIAG: M54.16 (ICD-10-CM) - Radiculopathy, lumbar region  Rationale for Evaluation and Treatment: Rehabilitation  THERAPY DIAG:  M54.16 (ICD-10-CM) - Radiculopathy, lumbar region  ONSET DATE: 06/08/2022  SUBJECTIVE:                                                                                                                                                                                            SUBJECTIVE STATEMENT: I am feeling better with PT. Driving is better.  40% overall reduction in pain.   PERTINENT HISTORY:  Scheduled for surgery for other gynecological issue on 07/29/22  PAIN:  Are you having pain? Yes: NPRS scale: 2/10 Pain location: low back and right leg Pain description: aching and the leg starts burning Aggravating factors: driving Relieving factors: Tylenol, gabapentin, baclofen  PRECAUTIONS: None  WEIGHT BEARING RESTRICTIONS: No  FALLS:  Has patient fallen in last 6 months? No  LIVING ENVIRONMENT: Lives with: lives with their family Lives in: House/apartment  OCCUPATION: retired Marine scientist   PLOF: Independent, Independent with basic ADLs, Independent with household mobility without device, Independent with community mobility without device, Independent with gait, and Independent with transfers  PATIENT GOALS: To eliminate leg pain and avoid further problems with my back.   NEXT MD VISIT: as needed  OBJECTIVE:   DIAGNOSTIC FINDINGS:  none  PATIENT SURVEYS:  FOTO 56 (goal is 15)  SCREENING FOR RED FLAGS: Bowel or bladder incontinence: No Spinal tumors: No Cauda equina syndrome: No Compression fracture: No Abdominal aneurysm: No  COGNITION: Overall cognitive status: Within functional limits for tasks assessed     SENSATION: Light touch: Impaired   MUSCLE LENGTH: Hamstrings: Right 60 deg; Left 60 deg Thomas test: Right pos ; Left pos  POSTURE:  Acquired scoliosis lumbar   LOWER EXTREMITY ROM:     WFL  LOWER EXTREMITY MMT:    Generally 4 to 4+/5 with exception  LUMBAR SPECIAL TESTS:  Straight leg raise test: Negative  FUNCTIONAL TESTS:  Eval: 5 times sit to stand: 14.67 Timed up and go (TUG): 9.52  GAIT: Distance walked: 50 Assistive device utilized: None Level of assistance: Complete Independence Comments:  antalgic   TODAY'S TREATMENT:                      DATE: 06/17/2022  Nustep level 3 x6 min with PT present to discuss status Seated hamstring stretch 2x20 sec Seated piriformis stretch 2x20 sec Prone on elbows x 2 min Prone press ups 2x10 Prone hip extension 2x10 bilat Sit to/from stand holding 4# dumbbell 2x10 Supine knee to chest 2x20 seconds  Sidelying clam on Rt 2x10 with tactile and verbal cues for alignment. Manual: addaday to Rt gluteals in sidelying x10 min                                                                                                 DATE: 06/15/2022 Nustep level 3 x6 min with PT present to discuss status Seated hamstring stretch 2x20 sec Seated piriformis stretch 2x20 sec Prone on elbows x 2 min Prone press ups 2x10 Prone hip extension 2x10 bilat Sit to/from stand holding 4# dumbbell 2x10 Seated modified dead lift with 4# 2x10 Trigger Point Dry-Needling  Treatment instructions: Expect mild to moderate muscle soreness. S/S of pneumothorax if dry needled over a lung field, and to seek immediate medical attention should they occur. Patient verbalized understanding of these instructions and education. Patient Consent Given: Yes Education handout provided: Yes Muscles treated: lumbar multifidi Electrical stimulation performed: No Parameters: N/A Treatment response/outcome: Manual techniques used to identify tight muscle bands and trigger points in the low back.  Once identified, these muscles were treated.  Patient experienced muscle twitch and there was palpable elongation of affected muscles tissue.  Patient reports decreased pain following treatment.   Manual Therapy:  Soft tissue mobilization to encourage further tissue elongation following dry needling.  DATE: 06/10/22 Recumbent bike x 5 min level 2 Prone lying x 2 min  Prone on elbows x 2 min Prone press up x 10 reps Reviewed HEP: standing hamstring stretch x 2 hold 30 sec each, standing quad stretch  2 x 30 sec  Added seated piriformis stretch 2 x 30 sec each Trigger Point Dry-Needling  Treatment instructions: Expect mild to moderate muscle soreness. S/S of pneumothorax if dry needled over a lung field, and to seek immediate medical attention should they occur. Patient verbalized understanding of these instructions and education. Patient Consent Given: Yes Education handout provided: Yes Muscles treated: lumbar multifidi, iliocostalis lumborum Electrical stimulation performed: No Parameters: N/A Treatment response/outcome: Manual techniques used to identify tight muscle bands and trigger points in the low back.  Once identified, these muscles were treated.  Patient experienced muscle twitch and there was palpable elongation of affected muscles tissue.  Patient reports decreased pain following treatment.     PATIENT EDUCATION:  Education details: Initiated HEP Person educated: Patient Education method: Consulting civil engineer, Media planner, Verbal cues, and Handouts Education comprehension: verbalized understanding, returned demonstration, and verbal cues required  HOME EXERCISE PROGRAM: Access Code: [email or web address] URL: [email or web address] Date: 06/10/2022 Prepared by: Candyce Churn  Exercises - Standing Hamstring Stretch on Chair  - 1 x daily - 7 x weekly - 1 sets - 3 reps - 30 hold - Standing Hip Flexor Stretch with Foot Elevated  - 1 x daily - 7 x weekly - 1 sets - 3 reps - 30 hold - Lying Prone  -  1 x daily - 7 x weekly - 1 sets - 1 reps - 2 min hold - Static Prone on Elbows  - 1 x daily - 7 x weekly - 1 sets - 1 reps - 2 min hold - Prone Press Up  - 1 x daily - 7 x weekly - 1 sets - 10 reps - Seated Piriformis Stretch with Trunk Bend  - 1 x daily - 7 x weekly - 1 sets - 3 reps - 30 hold  ASSESSMENT:  CLINICAL IMPRESSION: Pt reports that she is feeling better overall. She reports 40% overall improvement in symptoms since the start of care.  Pt is independent and compliant  in HEP.  Pt tolerated all exercise well today and PT provided verbal and tactile cues as needed for alignment.  Patient with good response to therapy treatment session and is on track to meet all goals.  OBJECTIVE IMPAIRMENTS: difficulty walking, decreased ROM, decreased strength, increased muscle spasms, impaired flexibility, impaired sensation, postural dysfunction, and pain.   ACTIVITY LIMITATIONS: carrying, lifting, bending, sitting, standing, squatting, sleeping, transfers, bathing, and dressing  PARTICIPATION LIMITATIONS: meal prep, cleaning, laundry, shopping, and community activity  PERSONAL FACTORS: Fitness and 1 comorbidity: pending gynecological surgery scheduled in January  are also affecting patient's functional outcome.   REHAB POTENTIAL: Good  CLINICAL DECISION MAKING: Stable/uncomplicated  EVALUATION COMPLEXITY: Low   GOALS: Goals reviewed with patient? Yes  SHORT TERM GOALS: Target date: 07/07/2022    Pain report to be no greater than 4/10  Baseline: Goal status: IN PROGRESS  2.  Patient will be independent with initial HEP  Baseline:  Goal status: MET  3.  Patient to report 50% improvement in overall symptoms Baseline: 40% (06/17/22) Goal status: In progress   LONG TERM GOALS: Target date: 08/04/2022    Patient to be independent with advanced HEP  Baseline:  Goal status: INITIAL  2.  Patient to report pain no greater than 2/10  Baseline:  Goal status: INITIAL  3.  Patient to report 85% improvement in overall symptoms  Baseline:  Goal status: INITIAL  4.  FOTO score to be 63 Baseline: 57 Goal status: INITIAL  5.  5 times sit to stand and TUG to improve by 3-5 sec Baseline:  Goal status: INITIAL  6.  Radicular symptoms to be centralized Baseline:  Goal status: INITIAL  PLAN:  PT FREQUENCY: 1-2x/week  PT DURATION: 8 weeks  PLANNED INTERVENTIONS: Therapeutic exercises, Therapeutic activity, Neuromuscular re-education, Balance training,  Gait training, Patient/Family education, Self Care, Joint mobilization, Stair training, Aquatic Therapy, Dry Needling, Electrical stimulation, Spinal mobilization, Cryotherapy, Moist heat, Taping, Traction, Ultrasound, Ionotophoresis 40m/ml Dexamethasone, Manual therapy, and Re-evaluation.  PLAN FOR NEXT SESSION: body mechanics for lifting grandchildren, core strength, extension based exercise, DN as helpful   KSigurd Sos PT 06/17/22 10:21 AM   [id number] [place] River Hills[place] [address]Phone # [phone] [phone]

## 2022-06-19 DIAGNOSIS — G4733 Obstructive sleep apnea (adult) (pediatric): Secondary | ICD-10-CM | POA: Diagnosis not present

## 2022-06-22 ENCOUNTER — Ambulatory Visit: Payer: PPO

## 2022-06-22 DIAGNOSIS — M5416 Radiculopathy, lumbar region: Secondary | ICD-10-CM | POA: Diagnosis not present

## 2022-06-22 DIAGNOSIS — R262 Difficulty in walking, not elsewhere classified: Secondary | ICD-10-CM

## 2022-06-22 DIAGNOSIS — R252 Cramp and spasm: Secondary | ICD-10-CM

## 2022-06-22 DIAGNOSIS — R293 Abnormal posture: Secondary | ICD-10-CM

## 2022-06-22 DIAGNOSIS — M6281 Muscle weakness (generalized): Secondary | ICD-10-CM

## 2022-06-22 NOTE — Therapy (Signed)
OUTPATIENT PHYSICAL THERAPY TREATMENT NOTE   Patient Name: [name] MRN: [id number] DOB:February 03, 1951, 71 y.o., female Today's Date: 06/22/2022  END OF SESSION:  PT End of Session - 06/22/22 1613     Visit Number 5    Date for PT Re-Evaluation 08/04/22    Authorization Type [employer]    PT Start Time 1530    PT Stop Time 1613    PT Time Calculation (min) 43 min    Activity Tolerance Patient tolerated treatment well    Behavior During Therapy WFL for tasks assessed/performed               Past Medical History:  Diagnosis Date   Asthma    has rescue inhaler, now on Flovent   Chronic back pain    Diabetes mellitus without complication ([place])    on medformin, on Ozempic   GERD (gastroesophageal reflux disease)    History of COVID-19 04/05/2021   Hyperlipemia    Hypertension    Hypothyroidism    Insomnia    OAB (overactive bladder)    Seasonal allergies    Sleep apnea    uses a cpap   Past Surgical History:  Procedure Laterality Date   ANKLE ARTHROTOMY  2003   left-fx   APPENDECTOMY     back fusion  02/14/2019   L4 back fusion   BACK SURGERY  2010   lumb disk   BREAST LUMPECTOMY WITH RADIOACTIVE SEED LOCALIZATION Left 06/13/2019   Procedure: LEFT BREAST LUMPECTOMY WITH RADIOACTIVE SEED LOCALIZATION;  Surgeon: Erroll Luna, MD;  Location: [place];  Service: General;  Laterality: Left;   CARPAL TUNNEL RELEASE     rt   CARPAL TUNNEL RELEASE Left 08/01/2013   Procedure: LEFT CARPAL TUNNEL RELEASE;  Surgeon: Wynonia Sours, MD;  Location: [place];  Service: Orthopedics;  Laterality: Left;   CESAREAN SECTION  83,86   COLONOSCOPY     DILATATION & CURETTAGE/HYSTEROSCOPY WITH MYOSURE N/A 08/23/2018   Procedure: DILATATION & CURETTAGE/HYSTEROSCOPY WITH MYOSURE POLYPECTOMY;  Surgeon: Nunzio Cobbs, MD;  Location: [facility];  Service: Gynecology;  Laterality: N/A;  fibroid resection   DILATION  AND CURETTAGE OF UTERUS     x2   MASS EXCISION N/A 08/04/2017   Procedure: EXCISION LIPOMA UPPER BACK x2 AND NECK;  Surgeon: Erroll Luna, MD;  Location: [place];  Service: General;  Laterality: N/A;   NASAL SEPTOPLASTY W/ TURBINOPLASTY Bilateral 06/21/2015   Procedure: NASAL SEPTOPLASTY WITH BILATERAL TURBINATE REDUCTION;  Surgeon: Jerrell Belfast, MD;  Location: [place];  Service: ENT;  Laterality: Bilateral;   SINOSCOPY     TONSILLECTOMY     TUBAL LIGATION     VULVECTOMY N/A 12/24/2021   Procedure: WIDE EXCISION VULVECTOMY;  Surgeon: [name], MD;  Location: [facility];  Service: Gynecology;  Laterality: N/A;   Patient Active Problem List   Diagnosis Date Noted   Hypothyroidism 01/22/2022   Insomnia 01/22/2022   Lipoma 01/22/2022   Major depression, single episode 01/22/2022   Morbid obesity ([place]) 01/22/2022   Overactive bladder 01/22/2022   Type 2 diabetes mellitus without complications ([place]) 12/45/8099   Allergic rhinitis due to pollen 01/22/2022   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[place]) 08/30/2021   BMI 37.0-37.9, adult 08/30/2021   Spondylolisthesis of lumbosacral region 02/14/2019   Mild persistent asthma without complication 83/38/2505   Mixed rhinitis 06/10/2018   Obstructive sleep apnea 12/11/2016   Palpitations 06/17/2016  Deflected nasal septum 09/13/2015   Hypertrophy of nasal turbinates 09/13/2015   Deviated nasal septum 06/21/2015    Class: Chronic   HYPERLIPIDEMIA 11/18/2007   Essential hypertension 11/18/2007   Allergic rhinitis 11/18/2007   COUGH 11/18/2007    PCP: Saintclair Halsted, FNP   REFERRING PROVIDER: Starling Manns, MD  REFERRING DIAG: M54.16 (ICD-10-CM) - Radiculopathy, lumbar region  Rationale for Evaluation and Treatment: Rehabilitation  THERAPY DIAG:  M54.16 (ICD-10-CM) - Radiculopathy, lumbar region  ONSET DATE: 06/08/2022  SUBJECTIVE:                                                                                                                                                                                            SUBJECTIVE STATEMENT: I have more pain when I am active with my grand kids.  40% overall reduction in pain.   PERTINENT HISTORY:  Scheduled for surgery for other gynecological issue on 07/29/22  PAIN:  Are you having pain? Yes: NPRS scale: 3/10 Pain location: low back and right leg Pain description: aching and the leg starts burning Aggravating factors: driving Relieving factors: Tylenol, gabapentin, baclofen  PRECAUTIONS: None  WEIGHT BEARING RESTRICTIONS: No  FALLS:  Has patient fallen in last 6 months? No  LIVING ENVIRONMENT: Lives with: lives with their family Lives in: House/apartment  OCCUPATION: retired Marine scientist   PLOF: Independent, Independent with basic ADLs, Independent with household mobility without device, Independent with community mobility without device, Independent with gait, and Independent with transfers  PATIENT GOALS: To eliminate leg pain and avoid further problems with my back.   NEXT MD VISIT: as needed  OBJECTIVE:   DIAGNOSTIC FINDINGS:  none  PATIENT SURVEYS:  FOTO 56 (goal is 43)  SCREENING FOR RED FLAGS: Bowel or bladder incontinence: No Spinal tumors: No Cauda equina syndrome: No Compression fracture: No Abdominal aneurysm: No  COGNITION: Overall cognitive status: Within functional limits for tasks assessed     SENSATION: Light touch: Impaired   MUSCLE LENGTH: Hamstrings: Right 60 deg; Left 60 deg Thomas test: Right pos ; Left pos  POSTURE:  Acquired scoliosis lumbar   LOWER EXTREMITY ROM:     WFL  LOWER EXTREMITY MMT:    Generally 4 to 4+/5 with exception  LUMBAR SPECIAL TESTS:  Straight leg raise test: Negative  FUNCTIONAL TESTS:  Eval: 5 times sit to stand: 14.67 Timed up and go (TUG): 9.52  GAIT: Distance walked: 50 Assistive device utilized: None Level of assistance: Complete  Independence Comments: antalgic   TODAY'S TREATMENT:         DATE: 06/22/2022 Nustep level 3 x6 min with PT present to discuss  status Seated hamstring stretch 2x20 sec Seated piriformis stretch 2x20 sec Prone on elbows x 2 min Standing hip abduction and extension with TA contraction 2x10 Sit to/from stand holding 4# dumbbell 2x10 Supine knee to chest 2x20 seconds  Sidelying clam on Rt 2x10 with tactile and verbal cues for alignment. Manual: addaday to Rt gluteals in sidelying x10 min               DATE: 06/17/2022 Nustep level 3 x6 min with PT present to discuss status Seated hamstring stretch 2x20 sec Seated piriformis stretch 2x20 sec Prone on elbows x 2 min Prone press ups 2x10 Prone hip extension 2x10 bilat Sit to/from stand holding 4# dumbbell 2x10 Supine knee to chest 2x20 seconds  Sidelying clam on Rt 2x10 with tactile and verbal cues for alignment. Manual: addaday to Rt gluteals in sidelying x10 min                                                                                                 DATE: 06/15/2022 Nustep level 3 x6 min with PT present to discuss status Seated hamstring stretch 2x20 sec Seated piriformis stretch 2x20 sec Prone on elbows x 2 min Prone press ups 2x10 Prone hip extension 2x10 bilat Sit to/from stand holding 4# dumbbell 2x10 Seated modified dead lift with 4# 2x10 Trigger Point Dry-Needling  Treatment instructions: Expect mild to moderate muscle soreness. S/S of pneumothorax if dry needled over a lung field, and to seek immediate medical attention should they occur. Patient verbalized understanding of these instructions and education. Patient Consent Given: Yes Education handout provided: Yes Muscles treated: lumbar multifidi Electrical stimulation performed: No Parameters: N/A Treatment response/outcome: Manual techniques used to identify tight muscle bands and trigger points in the low back.  Once identified, these muscles were treated.   Patient experienced muscle twitch and there was palpable elongation of affected muscles tissue.  Patient reports decreased pain following treatment.   Manual Therapy:  Soft tissue mobilization to encourage further tissue elongation following dry needling.   PATIENT EDUCATION:  Education details: Initiated HEP Person educated: Patient Education method: Consulting civil engineer, Media planner, Verbal cues, and Handouts Education comprehension: verbalized understanding, returned demonstration, and verbal cues required  HOME EXERCISE PROGRAM: Access Code: [email or web address] URL: [email or web address] Date: 06/10/2022 Prepared by: Candyce Churn  Exercises - Standing Hamstring Stretch on Chair  - 1 x daily - 7 x weekly - 1 sets - 3 reps - 30 hold - Standing Hip Flexor Stretch with Foot Elevated  - 1 x daily - 7 x weekly - 1 sets - 3 reps - 30 hold - Lying Prone  - 1 x daily - 7 x weekly - 1 sets - 1 reps - 2 min hold - Static Prone on Elbows  - 1 x daily - 7 x weekly - 1 sets - 1 reps - 2 min hold - Prone Press Up  - 1 x daily - 7 x weekly - 1 sets - 10 reps - Seated Piriformis Stretch with Trunk Bend  - 1 x daily - 7 x weekly - 1 sets - 3 reps -  30 hold  ASSESSMENT:  CLINICAL IMPRESSION: Pt reports that she is feeling better overall and has increased pain when she is active with caring for her grandchildren. She reports 40% overall improvement in symptoms since the start of care.  Pt is independent and compliant in HEP.  Pt tolerated all exercise well today including advancement to standing exercises.  PT provided verbal and tactile cues as needed for alignment.  Patient with good response to therapy treatment session and is on track to meet all goals.  OBJECTIVE IMPAIRMENTS: difficulty walking, decreased ROM, decreased strength, increased muscle spasms, impaired flexibility, impaired sensation, postural dysfunction, and pain.   ACTIVITY LIMITATIONS: carrying, lifting, bending, sitting, standing,  squatting, sleeping, transfers, bathing, and dressing  PARTICIPATION LIMITATIONS: meal prep, cleaning, laundry, shopping, and community activity  PERSONAL FACTORS: Fitness and 1 comorbidity: pending gynecological surgery scheduled in January  are also affecting patient's functional outcome.   REHAB POTENTIAL: Good  CLINICAL DECISION MAKING: Stable/uncomplicated  EVALUATION COMPLEXITY: Low   GOALS: Goals reviewed with patient? Yes  SHORT TERM GOALS: Target date: 07/07/2022    Pain report to be no greater than 4/10  Baseline: Goal status: IN PROGRESS  2.  Patient will be independent with initial HEP  Baseline:  Goal status: MET  3.  Patient to report 50% improvement in overall symptoms Baseline: 40% (06/17/22) Goal status: In progress   LONG TERM GOALS: Target date: 08/04/2022    Patient to be independent with advanced HEP  Baseline:  Goal status: INITIAL  2.  Patient to report pain no greater than 2/10  Baseline:  Goal status: INITIAL  3.  Patient to report 85% improvement in overall symptoms  Baseline:  Goal status: INITIAL  4.  FOTO score to be 63 Baseline: 57 Goal status: INITIAL  5.  5 times sit to stand and TUG to improve by 3-5 sec Baseline:  Goal status: INITIAL  6.  Radicular symptoms to be centralized Baseline:  Goal status: INITIAL  PLAN:  PT FREQUENCY: 1-2x/week  PT DURATION: 8 weeks  PLANNED INTERVENTIONS: Therapeutic exercises, Therapeutic activity, Neuromuscular re-education, Balance training, Gait training, Patient/Family education, Self Care, Joint mobilization, Stair training, Aquatic Therapy, Dry Needling, Electrical stimulation, Spinal mobilization, Cryotherapy, Moist heat, Taping, Traction, Ultrasound, Ionotophoresis 78m/ml Dexamethasone, Manual therapy, and Re-evaluation.  PLAN FOR NEXT SESSION: body mechanics for lifting grandchildren, core strength, extension based exercise, DN as helpful   KSigurd Sos PT 06/22/22 4:15 PM    [address] [place] [address]Phone # [phone] [phone]

## 2022-06-25 ENCOUNTER — Ambulatory Visit: Payer: PPO

## 2022-06-25 ENCOUNTER — Other Ambulatory Visit (HOSPITAL_BASED_OUTPATIENT_CLINIC_OR_DEPARTMENT_OTHER): Payer: Self-pay

## 2022-06-25 DIAGNOSIS — M5416 Radiculopathy, lumbar region: Secondary | ICD-10-CM

## 2022-06-25 DIAGNOSIS — M6281 Muscle weakness (generalized): Secondary | ICD-10-CM

## 2022-06-25 DIAGNOSIS — R262 Difficulty in walking, not elsewhere classified: Secondary | ICD-10-CM

## 2022-06-25 DIAGNOSIS — R252 Cramp and spasm: Secondary | ICD-10-CM

## 2022-06-25 NOTE — Therapy (Signed)
OUTPATIENT PHYSICAL THERAPY TREATMENT NOTE   Patient Name: [name] MRN: [id number] DOB:1950/12/28, 71 y.o., female Today's Date: 06/25/2022  END OF SESSION:  PT End of Session - 06/25/22 1012     Visit Number 6    Date for PT Re-Evaluation 08/04/22    Authorization Type [employer]    PT Start Time 0930    PT Stop Time 1012    PT Time Calculation (min) 42 min    Activity Tolerance Patient tolerated treatment well    Behavior During Therapy WFL for tasks assessed/performed                Past Medical History:  Diagnosis Date   Asthma    has rescue inhaler, now on Flovent   Chronic back pain    Diabetes mellitus without complication ([place])    on medformin, on Ozempic   GERD (gastroesophageal reflux disease)    History of COVID-19 04/05/2021   Hyperlipemia    Hypertension    Hypothyroidism    Insomnia    OAB (overactive bladder)    Seasonal allergies    Sleep apnea    uses a cpap   Past Surgical History:  Procedure Laterality Date   ANKLE ARTHROTOMY  2003   left-fx   APPENDECTOMY     back fusion  02/14/2019   L4 back fusion   BACK SURGERY  2010   lumb disk   BREAST LUMPECTOMY WITH RADIOACTIVE SEED LOCALIZATION Left 06/13/2019   Procedure: LEFT BREAST LUMPECTOMY WITH RADIOACTIVE SEED LOCALIZATION;  Surgeon: Erroll Luna, MD;  Location: [place];  Service: General;  Laterality: Left;   CARPAL TUNNEL RELEASE     rt   CARPAL TUNNEL RELEASE Left 08/01/2013   Procedure: LEFT CARPAL TUNNEL RELEASE;  Surgeon: Wynonia Sours, MD;  Location: [place];  Service: Orthopedics;  Laterality: Left;   CESAREAN SECTION  83,86   COLONOSCOPY     DILATATION & CURETTAGE/HYSTEROSCOPY WITH MYOSURE N/A 08/23/2018   Procedure: DILATATION & CURETTAGE/HYSTEROSCOPY WITH MYOSURE POLYPECTOMY;  Surgeon: Nunzio Cobbs, MD;  Location: [facility];  Service: Gynecology;  Laterality: N/A;  fibroid resection    DILATION AND CURETTAGE OF UTERUS     x2   MASS EXCISION N/A 08/04/2017   Procedure: EXCISION LIPOMA UPPER BACK x2 AND NECK;  Surgeon: Erroll Luna, MD;  Location: [place];  Service: General;  Laterality: N/A;   NASAL SEPTOPLASTY W/ TURBINOPLASTY Bilateral 06/21/2015   Procedure: NASAL SEPTOPLASTY WITH BILATERAL TURBINATE REDUCTION;  Surgeon: Jerrell Belfast, MD;  Location: [place];  Service: ENT;  Laterality: Bilateral;   SINOSCOPY     TONSILLECTOMY     TUBAL LIGATION     VULVECTOMY N/A 12/24/2021   Procedure: WIDE EXCISION VULVECTOMY;  Surgeon: [name], MD;  Location: [facility];  Service: Gynecology;  Laterality: N/A;   Patient Active Problem List   Diagnosis Date Noted   Hypothyroidism 01/22/2022   Insomnia 01/22/2022   Lipoma 01/22/2022   Major depression, single episode 01/22/2022   Morbid obesity ([place]) 01/22/2022   Overactive bladder 01/22/2022   Type 2 diabetes mellitus without complications ([place]) 79/39/0300   Allergic rhinitis due to pollen 01/22/2022   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[place]) 08/30/2021   BMI 37.0-37.9, adult 08/30/2021   Spondylolisthesis of lumbosacral region 02/14/2019   Mild persistent asthma without complication 92/33/0076   Mixed rhinitis 06/10/2018   Obstructive sleep apnea 12/11/2016   Palpitations 06/17/2016  Deflected nasal septum 09/13/2015   Hypertrophy of nasal turbinates 09/13/2015   Deviated nasal septum 06/21/2015    Class: Chronic   HYPERLIPIDEMIA 11/18/2007   Essential hypertension 11/18/2007   Allergic rhinitis 11/18/2007   COUGH 11/18/2007    PCP: Saintclair Halsted, FNP   REFERRING PROVIDER: Starling Manns, MD  REFERRING DIAG: M54.16 (ICD-10-CM) - Radiculopathy, lumbar region  Rationale for Evaluation and Treatment: Rehabilitation  THERAPY DIAG:  M54.16 (ICD-10-CM) - Radiculopathy, lumbar region  ONSET DATE: 06/08/2022  SUBJECTIVE:                                                                                                                                                                                            SUBJECTIVE STATEMENT: I have move pain today.  40% overall reduction in pain.   PERTINENT HISTORY:  Scheduled for surgery for other gynecological issue on 07/29/22  PAIN:  Are you having pain? Yes: NPRS scale: 5/10 Pain location: low back and right leg Pain description: aching and the leg starts burning Aggravating factors: driving Relieving factors: Tylenol, gabapentin, baclofen  PRECAUTIONS: None  WEIGHT BEARING RESTRICTIONS: No  FALLS:  Has patient fallen in last 6 months? No  LIVING ENVIRONMENT: Lives with: lives with their family Lives in: House/apartment  OCCUPATION: retired Marine scientist   PLOF: Independent, Independent with basic ADLs, Independent with household mobility without device, Independent with community mobility without device, Independent with gait, and Independent with transfers  PATIENT GOALS: To eliminate leg pain and avoid further problems with my back.   NEXT MD VISIT: as needed  OBJECTIVE:   DIAGNOSTIC FINDINGS:  none  PATIENT SURVEYS:  FOTO 56 (goal is 38)  SCREENING FOR RED FLAGS: Bowel or bladder incontinence: No Spinal tumors: No Cauda equina syndrome: No Compression fracture: No Abdominal aneurysm: No  COGNITION: Overall cognitive status: Within functional limits for tasks assessed     SENSATION: Light touch: Impaired   MUSCLE LENGTH: Hamstrings: Right 60 deg; Left 60 deg Thomas test: Right pos ; Left pos  POSTURE:  Acquired scoliosis lumbar   LOWER EXTREMITY ROM:     WFL  LOWER EXTREMITY MMT:    Generally 4 to 4+/5 with exception  LUMBAR SPECIAL TESTS:  Straight leg raise test: Negative  FUNCTIONAL TESTS:  Eval: 5 times sit to stand: 14.67 Timed up and go (TUG): 9.52  GAIT: Distance walked: 50 Assistive device utilized: None Level of assistance: Complete Independence Comments: antalgic    TODAY'S TREATMENT:      DATE: 06/25/2022 Nustep level 3 x6 min with PT present to discuss status Seated hamstring stretch 2x20 sec Seated piriformis stretch 2x20  sec Standing hip abduction and extension with TA contraction 2x10 Sit to/from stand holding 4# dumbbell 2x10         Trigger Point Dry-Needling  Treatment instructions: Expect mild to moderate muscle soreness. S/S of pneumothorax if dry needled over a lung field, and to seek immediate medical attention should they occur. Patient verbalized understanding of these instructions and education. Patient Consent Given: Yes Education handout provided: Yes Muscles treated: lumbar multifidi, Rt gluteals  Treatment response/outcome: Manual techniques used to identify tight muscle bands and trigger points in the low back.  Once identified, these muscles were treated.  Patient experienced muscle twitch and there was palpable elongation of affected muscles tissue.  Patient reports decreased pain following treatment.   Manual Therapy:  Soft tissue mobilization to encourage further tissue elongation following dry needling. DATE: 06/22/2022 Nustep level 3 x6 min with PT present to discuss status Seated hamstring stretch 2x20 sec Seated piriformis stretch 2x20 sec Prone on elbows x 2 min Standing hip abduction and extension with TA contraction 2x10 Sit to/from stand holding 4# dumbbell 2x10 Supine knee to chest 2x20 seconds  Sidelying clam on Rt 2x10 with tactile and verbal cues for alignment. Manual: addaday to Rt gluteals in sidelying x10 min               DATE: 06/17/2022 Nustep level 3 x6 min with PT present to discuss status Seated hamstring stretch 2x20 sec Seated piriformis stretch 2x20 sec Prone on elbows x 2 min Prone press ups 2x10 Prone hip extension 2x10 bilat Sit to/from stand holding 4# dumbbell 2x10 Supine knee to chest 2x20 seconds  Sidelying clam on Rt 2x10 with tactile and verbal cues for alignment. Manual: addaday to  Rt gluteals in sidelying x10 min                                                                                                 PATIENT EDUCATION:  Education details: Initiated HEP Person educated: Patient Education method: Consulting civil engineer, Media planner, Verbal cues, and Handouts Education comprehension: verbalized understanding, returned demonstration, and verbal cues required  HOME EXERCISE PROGRAM: Access Code: [email or web address] URL: [email or web address] Date: 06/10/2022 Prepared by: Candyce Churn  Exercises - Standing Hamstring Stretch on Chair  - 1 x daily - 7 x weekly - 1 sets - 3 reps - 30 hold - Standing Hip Flexor Stretch with Foot Elevated  - 1 x daily - 7 x weekly - 1 sets - 3 reps - 30 hold - Lying Prone  - 1 x daily - 7 x weekly - 1 sets - 1 reps - 2 min hold - Static Prone on Elbows  - 1 x daily - 7 x weekly - 1 sets - 1 reps - 2 min hold - Prone Press Up  - 1 x daily - 7 x weekly - 1 sets - 10 reps - Seated Piriformis Stretch with Trunk Bend  - 1 x daily - 7 x weekly - 1 sets - 3 reps - 30 hold  ASSESSMENT:  CLINICAL IMPRESSION: Pt reports that she is feeling better overall and has increased pain when  she is active with caring for her grandchildren. She reports 40% overall improvement in symptoms since the start of care.  Pt is independent and compliant in HEP.  Pt with tension in bil lumbar paraspinals and Rt gluteals and had good twitch response and improved tissue mobility after DN and manual today.  PT provided verbal and tactile cues as needed for alignment.  Patient with good response to therapy treatment session and is on track to meet all goals.  OBJECTIVE IMPAIRMENTS: difficulty walking, decreased ROM, decreased strength, increased muscle spasms, impaired flexibility, impaired sensation, postural dysfunction, and pain.   ACTIVITY LIMITATIONS: carrying, lifting, bending, sitting, standing, squatting, sleeping, transfers, bathing, and  dressing  PARTICIPATION LIMITATIONS: meal prep, cleaning, laundry, shopping, and community activity  PERSONAL FACTORS: Fitness and 1 comorbidity: pending gynecological surgery scheduled in January  are also affecting patient's functional outcome.   REHAB POTENTIAL: Good  CLINICAL DECISION MAKING: Stable/uncomplicated  EVALUATION COMPLEXITY: Low   GOALS: Goals reviewed with patient? Yes  SHORT TERM GOALS: Target date: 07/07/2022    Pain report to be no greater than 4/10  Baseline: Goal status: IN PROGRESS  2.  Patient will be independent with initial HEP  Baseline:  Goal status: MET  3.  Patient to report 50% improvement in overall symptoms Baseline: 40% (06/17/22) Goal status: In progress   LONG TERM GOALS: Target date: 08/04/2022    Patient to be independent with advanced HEP  Baseline:  Goal status: INITIAL  2.  Patient to report pain no greater than 2/10  Baseline:  Goal status: INITIAL  3.  Patient to report 85% improvement in overall symptoms  Baseline:  Goal status: INITIAL  4.  FOTO score to be 63 Baseline: 57 Goal status: INITIAL  5.  5 times sit to stand and TUG to improve by 3-5 sec Baseline:  Goal status: INITIAL  6.  Radicular symptoms to be centralized Baseline:  Goal status: INITIAL  PLAN:  PT FREQUENCY: 1-2x/week  PT DURATION: 8 weeks  PLANNED INTERVENTIONS: Therapeutic exercises, Therapeutic activity, Neuromuscular re-education, Balance training, Gait training, Patient/Family education, Self Care, Joint mobilization, Stair training, Aquatic Therapy, Dry Needling, Electrical stimulation, Spinal mobilization, Cryotherapy, Moist heat, Taping, Traction, Ultrasound, Ionotophoresis 59m/ml Dexamethasone, Manual therapy, and Re-evaluation.  PLAN FOR NEXT SESSION: body mechanics for lifting grandchildren, core strength, extension based exercise, DN as helpful   KSigurd Sos PT 06/25/22 10:13 AM   [facility] [address] [place] [address]Phone # [phone] [phone]

## 2022-06-26 ENCOUNTER — Other Ambulatory Visit: Payer: Self-pay | Admitting: Allergy

## 2022-06-30 ENCOUNTER — Ambulatory Visit: Payer: PPO

## 2022-07-02 ENCOUNTER — Telehealth: Payer: Self-pay | Admitting: *Deleted

## 2022-07-02 NOTE — Telephone Encounter (Signed)
Moved the patient's appt with Melissa APP from 1/12 to 1/3

## 2022-07-03 ENCOUNTER — Ambulatory Visit: Payer: PPO

## 2022-07-03 DIAGNOSIS — M5416 Radiculopathy, lumbar region: Secondary | ICD-10-CM

## 2022-07-03 DIAGNOSIS — R252 Cramp and spasm: Secondary | ICD-10-CM

## 2022-07-03 DIAGNOSIS — R262 Difficulty in walking, not elsewhere classified: Secondary | ICD-10-CM

## 2022-07-03 DIAGNOSIS — M6281 Muscle weakness (generalized): Secondary | ICD-10-CM

## 2022-07-03 NOTE — Therapy (Signed)
OUTPATIENT PHYSICAL THERAPY TREATMENT NOTE   Patient Name: [name] MRN: [id number] DOB:22-Apr-1951, 71 y.o., female Today's Date: 07/03/2022  END OF SESSION:  PT End of Session - 07/03/22 0805     Visit Number 7    Date for PT Re-Evaluation 08/04/22    Authorization Type [employer]    PT Start Time 0800    PT Stop Time 0838    PT Time Calculation (min) 38 min    Activity Tolerance Patient tolerated treatment well    Behavior During Therapy WFL for tasks assessed/performed                Past Medical History:  Diagnosis Date   Asthma    has rescue inhaler, now on Flovent   Chronic back pain    Diabetes mellitus without complication ([place])    on medformin, on Ozempic   GERD (gastroesophageal reflux disease)    History of COVID-19 04/05/2021   Hyperlipemia    Hypertension    Hypothyroidism    Insomnia    OAB (overactive bladder)    Seasonal allergies    Sleep apnea    uses a cpap   Past Surgical History:  Procedure Laterality Date   ANKLE ARTHROTOMY  2003   left-fx   APPENDECTOMY     back fusion  02/14/2019   L4 back fusion   BACK SURGERY  2010   lumb disk   BREAST LUMPECTOMY WITH RADIOACTIVE SEED LOCALIZATION Left 06/13/2019   Procedure: LEFT BREAST LUMPECTOMY WITH RADIOACTIVE SEED LOCALIZATION;  Surgeon: Erroll Luna, MD;  Location: [place];  Service: General;  Laterality: Left;   CARPAL TUNNEL RELEASE     rt   CARPAL TUNNEL RELEASE Left 08/01/2013   Procedure: LEFT CARPAL TUNNEL RELEASE;  Surgeon: Wynonia Sours, MD;  Location: [place];  Service: Orthopedics;  Laterality: Left;   CESAREAN SECTION  83,86   COLONOSCOPY     DILATATION & CURETTAGE/HYSTEROSCOPY WITH MYOSURE N/A 08/23/2018   Procedure: DILATATION & CURETTAGE/HYSTEROSCOPY WITH MYOSURE POLYPECTOMY;  Surgeon: Nunzio Cobbs, MD;  Location: [facility];  Service: Gynecology;  Laterality: N/A;  fibroid resection    DILATION AND CURETTAGE OF UTERUS     x2   MASS EXCISION N/A 08/04/2017   Procedure: EXCISION LIPOMA UPPER BACK x2 AND NECK;  Surgeon: Erroll Luna, MD;  Location: [place];  Service: General;  Laterality: N/A;   NASAL SEPTOPLASTY W/ TURBINOPLASTY Bilateral 06/21/2015   Procedure: NASAL SEPTOPLASTY WITH BILATERAL TURBINATE REDUCTION;  Surgeon: Jerrell Belfast, MD;  Location: [place];  Service: ENT;  Laterality: Bilateral;   SINOSCOPY     TONSILLECTOMY     TUBAL LIGATION     VULVECTOMY N/A 12/24/2021   Procedure: WIDE EXCISION VULVECTOMY;  Surgeon: [name], MD;  Location: [facility];  Service: Gynecology;  Laterality: N/A;   Patient Active Problem List   Diagnosis Date Noted   Hypothyroidism 01/22/2022   Insomnia 01/22/2022   Lipoma 01/22/2022   Major depression, single episode 01/22/2022   Morbid obesity ([place]) 01/22/2022   Overactive bladder 01/22/2022   Type 2 diabetes mellitus without complications ([place]) 84/13/2440   Allergic rhinitis due to pollen 01/22/2022   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[place]) 08/30/2021   BMI 37.0-37.9, adult 08/30/2021   Spondylolisthesis of lumbosacral region 02/14/2019   Mild persistent asthma without complication 05/02/2535   Mixed rhinitis 06/10/2018   Obstructive sleep apnea 12/11/2016   Palpitations 06/17/2016  Deflected nasal septum 09/13/2015   Hypertrophy of nasal turbinates 09/13/2015   Deviated nasal septum 06/21/2015    Class: Chronic   HYPERLIPIDEMIA 11/18/2007   Essential hypertension 11/18/2007   Allergic rhinitis 11/18/2007   COUGH 11/18/2007    PCP: Saintclair Halsted, FNP   REFERRING PROVIDER: Starling Manns, MD  REFERRING DIAG: M54.16 (ICD-10-CM) - Radiculopathy, lumbar region  Rationale for Evaluation and Treatment: Rehabilitation  THERAPY DIAG:  M54.16 (ICD-10-CM) - Radiculopathy, lumbar region  ONSET DATE: 06/08/2022  SUBJECTIVE:                                                                                                                                                                                            SUBJECTIVE STATEMENT: Patient reports she is doing well today.  She went to [employer] and had to do a lot of walking.  This normally would have set her back but she tolerated this with only minimal soreness.  She reports her pain at 3/10.    PERTINENT HISTORY:  Scheduled for surgery for other gynecological issue on 07/29/22  PAIN:  Are you having pain? Yes: NPRS scale: 5/10 Pain location: low back and right leg Pain description: aching and the leg starts burning Aggravating factors: driving Relieving factors: Tylenol, gabapentin, baclofen  PRECAUTIONS: None  WEIGHT BEARING RESTRICTIONS: No  FALLS:  Has patient fallen in last 6 months? No  LIVING ENVIRONMENT: Lives with: lives with their family Lives in: House/apartment  OCCUPATION: retired Marine scientist   PLOF: Independent, Independent with basic ADLs, Independent with household mobility without device, Independent with community mobility without device, Independent with gait, and Independent with transfers  PATIENT GOALS: To eliminate leg pain and avoid further problems with my back.   NEXT MD VISIT: as needed  OBJECTIVE:   DIAGNOSTIC FINDINGS:  none  PATIENT SURVEYS:  FOTO 56 (goal is 27)  SCREENING FOR RED FLAGS: Bowel or bladder incontinence: No Spinal tumors: No Cauda equina syndrome: No Compression fracture: No Abdominal aneurysm: No  COGNITION: Overall cognitive status: Within functional limits for tasks assessed     SENSATION: Light touch: Impaired   MUSCLE LENGTH: Hamstrings: Right 60 deg; Left 60 deg Thomas test: Right pos ; Left pos  POSTURE:  Acquired scoliosis lumbar   LOWER EXTREMITY ROM:     WFL  LOWER EXTREMITY MMT:    Generally 4 to 4+/5 with exception  LUMBAR SPECIAL TESTS:  Straight leg raise test: Negative  FUNCTIONAL TESTS:  Eval: 5 times sit to  stand: 14.67 Timed up and go (TUG): 9.52  GAIT: Distance walked: 50 Assistive device utilized: None Level of assistance:  Complete Independence Comments: antalgic   TODAY'S TREATMENT:      DATE: 07/03/2022 Nustep level 5 x 5 min with PT present to discuss status Seated hamstring stretch 2x20 sec Seated piriformis stretch 2x20 sec Standing hip abduction and extension with TA contraction 2x10 Sit to/from stand holding 5# kb 2x10         Trigger Point Dry-Needling  Treatment instructions: Expect mild to moderate muscle soreness. S/S of pneumothorax if dry needled over a lung field, and to seek immediate medical attention should they occur. Patient verbalized understanding of these instructions and education. Patient Consent Given: Yes Education handout provided: Yes Muscles treated: lumbar multifidi, Rt gluteals  Treatment response/outcome: Manual techniques used to identify tight muscle bands and trigger points in the low back.  Once identified, these muscles were treated.  Patient experienced muscle twitch and there was palpable elongation of affected muscles tissue.  Patient reports decreased pain following treatment.   Manual Therapy:  Soft tissue mobilization to encourage further tissue elongation following dry needling.  DATE: 06/25/2022 Nustep level 3 x6 min with PT present to discuss status Seated hamstring stretch 2x20 sec Seated piriformis stretch 2x20 sec Standing hip abduction and extension with TA contraction 2x10 Sit to/from stand holding 4# dumbbell 2x10         Trigger Point Dry-Needling  Treatment instructions: Expect mild to moderate muscle soreness. S/S of pneumothorax if dry needled over a lung field, and to seek immediate medical attention should they occur. Patient verbalized understanding of these instructions and education. Patient Consent Given: Yes Education handout provided: Yes Muscles treated: lumbar multifidi, Rt gluteals  Treatment response/outcome:  Manual techniques used to identify tight muscle bands and trigger points in the low back.  Once identified, these muscles were treated.  Patient experienced muscle twitch and there was palpable elongation of affected muscles tissue.  Patient reports decreased pain following treatment.   Manual Therapy:  Soft tissue mobilization to encourage further tissue elongation following dry needling. DATE: 06/22/2022 Nustep level 3 x6 min with PT present to discuss status Seated hamstring stretch 2x20 sec Seated piriformis stretch 2x20 sec Prone on elbows x 2 min Standing hip abduction and extension with TA contraction 2x10 Sit to/from stand holding 4# dumbbell 2x10 Supine knee to chest 2x20 seconds  Sidelying clam on Rt 2x10 with tactile and verbal cues for alignment. Manual: addaday to Rt gluteals in sidelying x10 min                PATIENT EDUCATION:  Education details: Initiated HEP Person educated: Patient Education method: Consulting civil engineer, Media planner, Verbal cues, and Handouts Education comprehension: verbalized understanding, returned demonstration, and verbal cues required  HOME EXERCISE PROGRAM: Access Code: [email or web address] URL: [email or web address] Date: 06/10/2022 Prepared by: Candyce Churn  Exercises - Standing Hamstring Stretch on Chair  - 1 x daily - 7 x weekly - 1 sets - 3 reps - 30 hold - Standing Hip Flexor Stretch with Foot Elevated  - 1 x daily - 7 x weekly - 1 sets - 3 reps - 30 hold - Lying Prone  - 1 x daily - 7 x weekly - 1 sets - 1 reps - 2 min hold - Static Prone on Elbows  - 1 x daily - 7 x weekly - 1 sets - 1 reps - 2 min hold - Prone Press Up  - 1 x daily - 7 x weekly - 1 sets - 10 reps - Seated Piriformis Stretch with Trunk Bend  - 1 x daily -  7 x weekly - 1 sets - 3 reps - 30 hold  ASSESSMENT:  CLINICAL IMPRESSION: [name] is progressing appropriately.  She was able to walk a long distance this past week and had only minimal soreness.  She is compliant  and well motivated and working toward her goals with no issues.  She should continue to do well.  She would benefit from skilled PT for core strengthening and LE flexibility.    OBJECTIVE IMPAIRMENTS: difficulty walking, decreased ROM, decreased strength, increased muscle spasms, impaired flexibility, impaired sensation, postural dysfunction, and pain.   ACTIVITY LIMITATIONS: carrying, lifting, bending, sitting, standing, squatting, sleeping, transfers, bathing, and dressing  PARTICIPATION LIMITATIONS: meal prep, cleaning, laundry, shopping, and community activity  PERSONAL FACTORS: Fitness and 1 comorbidity: pending gynecological surgery scheduled in January  are also affecting patient's functional outcome.   REHAB POTENTIAL: Good  CLINICAL DECISION MAKING: Stable/uncomplicated  EVALUATION COMPLEXITY: Low   GOALS: Goals reviewed with patient? Yes  SHORT TERM GOALS: Target date: 07/07/2022    Pain report to be no greater than 4/10  Baseline: Goal status: IN PROGRESS  2.  Patient will be independent with initial HEP  Baseline:  Goal status: MET  3.  Patient to report 50% improvement in overall symptoms Baseline: 40% (06/17/22) Goal status: In progress   LONG TERM GOALS: Target date: 08/04/2022    Patient to be independent with advanced HEP  Baseline:  Goal status: INITIAL  2.  Patient to report pain no greater than 2/10  Baseline:  Goal status: INITIAL  3.  Patient to report 85% improvement in overall symptoms  Baseline:  Goal status: INITIAL  4.  FOTO score to be 63 Baseline: 57 Goal status: INITIAL  5.  5 times sit to stand and TUG to improve by 3-5 sec Baseline:  Goal status: INITIAL  6.  Radicular symptoms to be centralized Baseline:  Goal status: INITIAL  PLAN:  PT FREQUENCY: 1-2x/week  PT DURATION: 8 weeks  PLANNED INTERVENTIONS: Therapeutic exercises, Therapeutic activity, Neuromuscular re-education, Balance training, Gait training,  Patient/Family education, Self Care, Joint mobilization, Stair training, Aquatic Therapy, Dry Needling, Electrical stimulation, Spinal mobilization, Cryotherapy, Moist heat, Taping, Traction, Ultrasound, Ionotophoresis 51m/ml Dexamethasone, Manual therapy, and Re-evaluation.  PLAN FOR NEXT SESSION: body mechanics for lifting grandchildren, core strength, extension based exercise, DN as helpful   Jefferey Lippmann B. Marlis Oldaker, PT 07/03/22 8:45 AM   [facility] [address] [place] [address]Phone # [phone] [phone]

## 2022-07-07 ENCOUNTER — Telehealth: Payer: PPO | Admitting: Gynecologic Oncology

## 2022-07-08 ENCOUNTER — Encounter: Payer: Self-pay | Admitting: Gynecologic Oncology

## 2022-07-08 ENCOUNTER — Inpatient Hospital Stay: Payer: PPO | Attending: Gynecologic Oncology

## 2022-07-08 ENCOUNTER — Ambulatory Visit: Payer: PPO | Attending: Orthopaedic Surgery

## 2022-07-08 DIAGNOSIS — C519 Malignant neoplasm of vulva, unspecified: Secondary | ICD-10-CM | POA: Insufficient documentation

## 2022-07-08 DIAGNOSIS — R252 Cramp and spasm: Secondary | ICD-10-CM | POA: Insufficient documentation

## 2022-07-08 DIAGNOSIS — M6281 Muscle weakness (generalized): Secondary | ICD-10-CM | POA: Diagnosis not present

## 2022-07-08 DIAGNOSIS — R293 Abnormal posture: Secondary | ICD-10-CM | POA: Diagnosis not present

## 2022-07-08 DIAGNOSIS — M5416 Radiculopathy, lumbar region: Secondary | ICD-10-CM | POA: Diagnosis not present

## 2022-07-08 DIAGNOSIS — R262 Difficulty in walking, not elsewhere classified: Secondary | ICD-10-CM | POA: Diagnosis not present

## 2022-07-08 NOTE — Therapy (Signed)
OUTPATIENT PHYSICAL THERAPY TREATMENT NOTE   Patient Name: [name] MRN: [id number] DOB:Nov 19, 1950, 72 y.o., female Today's Date: 07/08/2022  END OF SESSION:  PT End of Session - 07/08/22 1013     Visit Number 8    Date for PT Re-Evaluation 08/04/22    Authorization Type [employer]    PT Start Time 0933    PT Stop Time 1013    PT Time Calculation (min) 40 min    Activity Tolerance Patient tolerated treatment well    Behavior During Therapy WFL for tasks assessed/performed                 Past Medical History:  Diagnosis Date   Asthma    has rescue inhaler, now on Flovent   Chronic back pain    Diabetes mellitus without complication ([place])    on medformin, on Ozempic   GERD (gastroesophageal reflux disease)    History of COVID-19 04/05/2021   Hyperlipemia    Hypertension    Hypothyroidism    Insomnia    OAB (overactive bladder)    Seasonal allergies    Sleep apnea    uses a cpap   Past Surgical History:  Procedure Laterality Date   ANKLE ARTHROTOMY  2003   left-fx   APPENDECTOMY     back fusion  02/14/2019   L4 back fusion   BACK SURGERY  2010   lumb disk   BREAST LUMPECTOMY WITH RADIOACTIVE SEED LOCALIZATION Left 06/13/2019   Procedure: LEFT BREAST LUMPECTOMY WITH RADIOACTIVE SEED LOCALIZATION;  Surgeon: Erroll Luna, MD;  Location: [facility];  Service: General;  Laterality: Left;   CARPAL TUNNEL RELEASE     rt   CARPAL TUNNEL RELEASE Left 08/01/2013   Procedure: LEFT CARPAL TUNNEL RELEASE;  Surgeon: Wynonia Sours, MD;  Location: [facility];  Service: Orthopedics;  Laterality: Left;   CESAREAN SECTION  83,86   COLONOSCOPY     DILATATION & CURETTAGE/HYSTEROSCOPY WITH MYOSURE N/A 08/23/2018   Procedure: DILATATION & CURETTAGE/HYSTEROSCOPY WITH MYOSURE POLYPECTOMY;  Surgeon: Nunzio Cobbs, MD;  Location: [facility];  Service: Gynecology;  Laterality: N/A;  fibroid resection    DILATION AND CURETTAGE OF UTERUS     x2   MASS EXCISION N/A 08/04/2017   Procedure: EXCISION LIPOMA UPPER BACK x2 AND NECK;  Surgeon: Erroll Luna, MD;  Location: [facility];  Service: General;  Laterality: N/A;   NASAL SEPTOPLASTY W/ TURBINOPLASTY Bilateral 06/21/2015   Procedure: NASAL SEPTOPLASTY WITH BILATERAL TURBINATE REDUCTION;  Surgeon: Jerrell Belfast, MD;  Location: [facility];  Service: ENT;  Laterality: Bilateral;   SINOSCOPY     TONSILLECTOMY     TUBAL LIGATION     VULVECTOMY N/A 12/24/2021   Procedure: WIDE EXCISION VULVECTOMY;  Surgeon: [name], MD;  Location: ;  Service: Gynecology;  Laterality: N/A;   Patient Active Problem List   Diagnosis Date Noted   Hypothyroidism 01/22/2022   Insomnia 01/22/2022   Lipoma 01/22/2022   Major depression, single episode 01/22/2022   Morbid obesity ([place]) 01/22/2022   Overactive bladder 01/22/2022   Type 2 diabetes mellitus without complications ([place]) 38/04/1750   Allergic rhinitis due to pollen 01/22/2022   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[place]) 08/30/2021   BMI 37.0-37.9, adult 08/30/2021   Spondylolisthesis of lumbosacral region 02/14/2019   Mild persistent asthma without complication 02/58/5277   Mixed rhinitis 06/10/2018   Obstructive sleep apnea 12/11/2016   Palpitations  06/17/2016   Deflected nasal septum 09/13/2015   Hypertrophy of nasal turbinates 09/13/2015   Deviated nasal septum 06/21/2015    Class: Chronic   HYPERLIPIDEMIA 11/18/2007   Essential hypertension 11/18/2007   Allergic rhinitis 11/18/2007   COUGH 11/18/2007    PCP: Saintclair Halsted, FNP   REFERRING PROVIDER: Starling Manns, MD  REFERRING DIAG: M54.16 (ICD-10-CM) - Radiculopathy, lumbar region  Rationale for Evaluation and Treatment: Rehabilitation  THERAPY DIAG:  M54.16 (ICD-10-CM) - Radiculopathy, lumbar region  ONSET DATE: 06/08/2022  SUBJECTIVE:                                                                                                                                                                                            SUBJECTIVE STATEMENT: I haven't been caring for my grandkids so my back has been feeing good.  I have them today so I'll see how I feel.    PERTINENT HISTORY:  Scheduled for surgery for other gynecological issue on 07/29/22  PAIN:  Are you having pain? Yes: NPRS scale: 5/10 Pain location: low back and right leg Pain description: aching and the leg starts burning Aggravating factors: driving Relieving factors: Tylenol, gabapentin, baclofen  PRECAUTIONS: None  WEIGHT BEARING RESTRICTIONS: No  FALLS:  Has patient fallen in last 6 months? No  LIVING ENVIRONMENT: Lives with: lives with their family Lives in: House/apartment  OCCUPATION: retired Marine scientist   PLOF: Independent, Independent with basic ADLs, Independent with household mobility without device, Independent with community mobility without device, Independent with gait, and Independent with transfers  PATIENT GOALS: To eliminate leg pain and avoid further problems with my back.   NEXT MD VISIT: as needed  OBJECTIVE:   DIAGNOSTIC FINDINGS:  none  PATIENT SURVEYS:  FOTO 56 (goal is 73)  SCREENING FOR RED FLAGS: Bowel or bladder incontinence: No Spinal tumors: No Cauda equina syndrome: No Compression fracture: No Abdominal aneurysm: No  COGNITION: Overall cognitive status: Within functional limits for tasks assessed     SENSATION: Light touch: Impaired   MUSCLE LENGTH: Hamstrings: Right 60 deg; Left 60 deg Thomas test: Right pos ; Left pos  POSTURE:  Acquired scoliosis lumbar   LOWER EXTREMITY ROM:     WFL  LOWER EXTREMITY MMT:    Generally 4 to 4+/5 with exception  LUMBAR SPECIAL TESTS:  Straight leg raise test: Negative  FUNCTIONAL TESTS:  Eval: 5 times sit to stand: 14.67 Timed up and go (TUG): 9.52  GAIT: Distance walked: 50 Assistive device utilized: None Level of  assistance: Complete Independence Comments: antalgic   TODAY'S TREATMENT:      DATE: 07/08/22 Nustep level  5 x 7 min with PT present to discuss status Seated hamstring stretch 2x20 sec Seated piriformis stretch 2x20 sec Low trunk rotation: 3x20 seconds  Standing hip abduction and extension with TA contraction 2x10 Sit to/from stand holding 5# kb 2x10 Supine TA activation    DATE: 07/03/2022 Nustep level 5 x 5 min with PT present to discuss status Seated hamstring stretch 2x20 sec Seated piriformis stretch 2x20 sec Standing hip abduction and extension with TA contraction 2x10 Sit to/from stand holding 5# kb 2x10         Trigger Point Dry-Needling  Treatment instructions: Expect mild to moderate muscle soreness. S/S of pneumothorax if dry needled over a lung field, and to seek immediate medical attention should they occur. Patient verbalized understanding of these instructions and education. Patient Consent Given: Yes Education handout provided: Yes Muscles treated: lumbar multifidi, Rt gluteals  Treatment response/outcome: Manual techniques used to identify tight muscle bands and trigger points in the low back.  Once identified, these muscles were treated.  Patient experienced muscle twitch and there was palpable elongation of affected muscles tissue.  Patient reports decreased pain following treatment.   Manual Therapy:  Soft tissue mobilization to encourage further tissue elongation following dry needling.  DATE: 06/25/2022 Nustep level 3 x6 min with PT present to discuss status Seated hamstring stretch 2x20 sec Seated piriformis stretch 2x20 sec Standing hip abduction and extension with TA contraction 2x10 Sit to/from stand holding 4# dumbbell 2x10         Trigger Point Dry-Needling  Treatment instructions: Expect mild to moderate muscle soreness. S/S of pneumothorax if dry needled over a lung field, and to seek immediate medical attention should they occur. Patient verbalized  understanding of these instructions and education. Patient Consent Given: Yes Education handout provided: Yes Muscles treated: lumbar multifidi, Rt gluteals  Treatment response/outcome: Manual techniques used to identify tight muscle bands and trigger points in the low back.  Once identified, these muscles were treated.  Patient experienced muscle twitch and there was palpable elongation of affected muscles tissue.  Patient reports decreased pain following treatment.   Manual Therapy:  Soft tissue mobilization to encourage further tissue elongation following dry needling.  PATIENT EDUCATION:  Education details: Initiated HEP Person educated: Patient Education method: Consulting civil engineer, Media planner, Verbal cues, and Handouts Education comprehension: verbalized understanding, returned demonstration, and verbal cues required  HOME EXERCISE PROGRAM: Access Code: [email or web address] URL: [email or web address] Date: 06/10/2022 Prepared by: Candyce Churn  Exercises - Standing Hamstring Stretch on Chair  - 1 x daily - 7 x weekly - 1 sets - 3 reps - 30 hold - Standing Hip Flexor Stretch with Foot Elevated  - 1 x daily - 7 x weekly - 1 sets - 3 reps - 30 hold - Lying Prone  - 1 x daily - 7 x weekly - 1 sets - 1 reps - 2 min hold - Static Prone on Elbows  - 1 x daily - 7 x weekly - 1 sets - 1 reps - 2 min hold - Prone Press Up  - 1 x daily - 7 x weekly - 1 sets - 10 reps - Seated Piriformis Stretch with Trunk Bend  - 1 x daily - 7 x weekly - 1 sets - 3 reps - 30 hold  ASSESSMENT:  CLINICAL IMPRESSION: Pt has been able to walk longer distances without limitation.  She has not been caring for her grandchildren so her back has been feeling overall better.  Pt reports 60-70% overall reduction in  her pain and 4/10 max pain that is mostly centralized into her low back now.  All STGs are met.  She would benefit from skilled PT for core strengthening and LE flexibility.    OBJECTIVE IMPAIRMENTS:  difficulty walking, decreased ROM, decreased strength, increased muscle spasms, impaired flexibility, impaired sensation, postural dysfunction, and pain.   ACTIVITY LIMITATIONS: carrying, lifting, bending, sitting, standing, squatting, sleeping, transfers, bathing, and dressing  PARTICIPATION LIMITATIONS: meal prep, cleaning, laundry, shopping, and community activity  PERSONAL FACTORS: Fitness and 1 comorbidity: pending gynecological surgery scheduled in January  are also affecting patient's functional outcome.   REHAB POTENTIAL: Good  CLINICAL DECISION MAKING: Stable/uncomplicated  EVALUATION COMPLEXITY: Low   GOALS: Goals reviewed with patient? Yes  SHORT TERM GOALS: Target date: 07/07/2022    Pain report to be no greater than 4/10  Baseline: Goal status: IN PROGRESS  2.  Patient will be independent with initial HEP  Baseline:  Goal status: MET  3.  Patient to report 50% improvement in overall symptoms Baseline: 70% (07/08/22) Goal status: MET   LONG TERM GOALS: Target date: 08/04/2022    Patient to be independent with advanced HEP  Baseline:  Goal status: INITIAL  2.  Patient to report pain no greater than 2/10  Baseline:  Goal status: INITIAL  3.  Patient to report 85% improvement in overall symptoms  Baseline: 60-70% (07/08/22) Goal status: in progress   4.  FOTO score to be 63 Baseline: 57 Goal status: INITIAL  5.  5 times sit to stand and TUG to improve by 3-5 sec Baseline:  Goal status: INITIAL  6.  Radicular symptoms to be centralized Baseline: mostly back, mild into leg that is intermittent (07/08/22) Goal status: In progress   PLAN:  PT FREQUENCY: 1-2x/week  PT DURATION: 8 weeks  PLANNED INTERVENTIONS: Therapeutic exercises, Therapeutic activity, Neuromuscular re-education, Balance training, Gait training, Patient/Family education, Self Care, Joint mobilization, Stair training, Aquatic Therapy, Dry Needling, Electrical stimulation, Spinal  mobilization, Cryotherapy, Moist heat, Taping, Traction, Ultrasound, Ionotophoresis 68m/ml Dexamethasone, Manual therapy, and Re-evaluation.  PLAN FOR NEXT SESSION:  core strength, extension based exercise, DN as helpful   KSigurd Sos PT 07/08/22 10:14 AM   [place] SSecretaryGMontgomery Village [place] [address]Phone # [phone] [phone]

## 2022-07-08 NOTE — Progress Notes (Signed)
Telephone call to check on pre-operative status.  Patient compliant with pre-operative instructions.  Reinforced nothing to eat after midnight. Clear liquids until 4:15am. Patient to arrive at 5:15am.  No questions or concerns voiced.  Instructed to call for any needs.

## 2022-07-10 ENCOUNTER — Other Ambulatory Visit: Payer: Self-pay | Admitting: Gynecologic Oncology

## 2022-07-10 ENCOUNTER — Encounter: Payer: Self-pay | Admitting: Physical Therapy

## 2022-07-10 ENCOUNTER — Ambulatory Visit: Payer: PPO | Admitting: Physical Therapy

## 2022-07-10 DIAGNOSIS — R252 Cramp and spasm: Secondary | ICD-10-CM

## 2022-07-10 DIAGNOSIS — M6281 Muscle weakness (generalized): Secondary | ICD-10-CM

## 2022-07-10 DIAGNOSIS — M5416 Radiculopathy, lumbar region: Secondary | ICD-10-CM

## 2022-07-10 DIAGNOSIS — M4722 Other spondylosis with radiculopathy, cervical region: Secondary | ICD-10-CM | POA: Diagnosis not present

## 2022-07-10 DIAGNOSIS — R293 Abnormal posture: Secondary | ICD-10-CM

## 2022-07-10 DIAGNOSIS — C519 Malignant neoplasm of vulva, unspecified: Secondary | ICD-10-CM

## 2022-07-10 DIAGNOSIS — R262 Difficulty in walking, not elsewhere classified: Secondary | ICD-10-CM

## 2022-07-10 MED ORDER — TRAMADOL HCL 50 MG PO TABS: 50.0000 mg | ORAL_TABLET | Freq: Four times a day (QID) | ORAL | 0 refills | Status: DC | PRN

## 2022-07-10 NOTE — Therapy (Signed)
OUTPATIENT PHYSICAL THERAPY TREATMENT NOTE   Patient Name: [name] MRN: [id number] DOB:[birth year], 72 y.o., female Today's Date: 07/10/2022  END OF SESSION:  PT End of Session - 07/10/22 1057     Visit Number 9    Date for PT Re-Evaluation 08/04/22    Authorization Type [employer]    PT Start Time 1055    PT Stop Time 1135    PT Time Calculation (min) 40 min    Activity Tolerance Patient tolerated treatment well    Behavior During Therapy WFL for tasks assessed/performed                  Past Medical History:  Diagnosis Date   Asthma    has rescue inhaler, now on Flovent   Chronic back pain    Diabetes mellitus without complication ([place])    on medformin, on Ozempic   GERD (gastroesophageal reflux disease)    History of COVID-19 04/05/2021   Hyperlipemia    Hypertension    Hypothyroidism    Insomnia    OAB (overactive bladder)    Seasonal allergies    Sleep apnea    uses a cpap   Past Surgical History:  Procedure Laterality Date   ANKLE ARTHROTOMY  2003   left-fx   APPENDECTOMY     back fusion  02/14/2019   L4 back fusion   BACK SURGERY  2010   lumb disk   BREAST LUMPECTOMY WITH RADIOACTIVE SEED LOCALIZATION Left 06/13/2019   Procedure: LEFT BREAST LUMPECTOMY WITH RADIOACTIVE SEED LOCALIZATION;  Surgeon: Erroll Luna, MD;  Location: [facility];  Service: General;  Laterality: Left;   CARPAL TUNNEL RELEASE     rt   CARPAL TUNNEL RELEASE Left 08/01/2013   Procedure: LEFT CARPAL TUNNEL RELEASE;  Surgeon: Wynonia Sours, MD;  Location: [facility];  Service: Orthopedics;  Laterality: Left;   CESAREAN SECTION  83,86   COLONOSCOPY     DILATATION & CURETTAGE/HYSTEROSCOPY WITH MYOSURE N/A 08/23/2018   Procedure: DILATATION & CURETTAGE/HYSTEROSCOPY WITH MYOSURE POLYPECTOMY;  Surgeon: Nunzio Cobbs, MD;  Location: [facility];  Service: Gynecology;  Laterality: N/A;  fibroid resection    DILATION AND CURETTAGE OF UTERUS     x2   MASS EXCISION N/A 08/04/2017   Procedure: EXCISION LIPOMA UPPER BACK x2 AND NECK;  Surgeon: Erroll Luna, MD;  Location: [facility];  Service: General;  Laterality: N/A;   NASAL SEPTOPLASTY W/ TURBINOPLASTY Bilateral 06/21/2015   Procedure: NASAL SEPTOPLASTY WITH BILATERAL TURBINATE REDUCTION;  Surgeon: Jerrell Belfast, MD;  Location: [facility];  Service: ENT;  Laterality: Bilateral;   SINOSCOPY     TONSILLECTOMY     TUBAL LIGATION     VULVECTOMY N/A 12/24/2021   Procedure: WIDE EXCISION VULVECTOMY;  Surgeon: [name], MD;  Location: [facility];  Service: Gynecology;  Laterality: N/A;   Patient Active Problem List   Diagnosis Date Noted   Hypothyroidism 01/22/2022   Insomnia 01/22/2022   Lipoma 01/22/2022   Major depression, single episode 01/22/2022   Morbid obesity ([place]) 01/22/2022   Overactive bladder 01/22/2022   Type 2 diabetes mellitus without complications ([place]) 66/12/3014   Allergic rhinitis due to pollen 01/22/2022   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[place]) 08/30/2021   BMI 37.0-37.9, adult 08/30/2021   Spondylolisthesis of lumbosacral region 02/14/2019   Mild persistent asthma without complication 07/14/3233   Mixed rhinitis 06/10/2018   Obstructive sleep apnea 12/11/2016  Palpitations 06/17/2016   Deflected nasal septum 09/13/2015   Hypertrophy of nasal turbinates 09/13/2015   Deviated nasal septum 06/21/2015    Class: Chronic   HYPERLIPIDEMIA 11/18/2007   Essential hypertension 11/18/2007   Allergic rhinitis 11/18/2007   COUGH 11/18/2007    PCP: Saintclair Halsted, FNP   REFERRING PROVIDER: Starling Manns, MD  REFERRING DIAG: M54.16 (ICD-10-CM) - Radiculopathy, lumbar region  Rationale for Evaluation and Treatment: Rehabilitation  THERAPY DIAG:  M54.16 (ICD-10-CM) - Radiculopathy, lumbar region  ONSET DATE: 06/08/2022  SUBJECTIVE:                                                                                                                                                                                            SUBJECTIVE STATEMENT:  I am doing well, the stretching is really helping me.  PERTINENT HISTORY:  Scheduled for surgery for other gynecological issue on 07/29/22  PAIN:  Are you having pain? Yes: NPRS scale: 0/10 Pain location: low back and right leg Pain description: aching and the leg starts burning Aggravating factors: driving Relieving factors: Tylenol, gabapentin, baclofen  PRECAUTIONS: None  WEIGHT BEARING RESTRICTIONS: No  FALLS:  Has patient fallen in last 6 months? No  LIVING ENVIRONMENT: Lives with: lives with their family Lives in: House/apartment  OCCUPATION: retired Marine scientist   PLOF: Independent, Independent with basic ADLs, Independent with household mobility without device, Independent with community mobility without device, Independent with gait, and Independent with transfers  PATIENT GOALS: To eliminate leg pain and avoid further problems with my back.   NEXT MD VISIT: as needed  OBJECTIVE:   DIAGNOSTIC FINDINGS:  none  PATIENT SURVEYS:  FOTO 56 (goal is 56)  SCREENING FOR RED FLAGS: Bowel or bladder incontinence: No Spinal tumors: No Cauda equina syndrome: No Compression fracture: No Abdominal aneurysm: No  COGNITION: Overall cognitive status: Within functional limits for tasks assessed     SENSATION: Light touch: Impaired   MUSCLE LENGTH: Hamstrings: Right 60 deg; Left 60 deg Thomas test: Right pos ; Left pos  POSTURE:  Acquired scoliosis lumbar   LOWER EXTREMITY ROM:     WFL  LOWER EXTREMITY MMT:    Generally 4 to 4+/5 with exception  LUMBAR SPECIAL TESTS:  Straight leg raise test: Negative  FUNCTIONAL TESTS:  Eval: 5 times sit to stand: 14.67 Timed up and go (TUG): 9.52  GAIT: Distance walked: 50 Assistive device utilized: None Level of assistance: Complete Independence Comments: antalgic   TODAY'S  TREATMENT:      07/10/21: Nustep level 5 x 7 min with PTA  present to discuss status Seated hamstring stretch 2x30 sec  Seated piriformis stretch 2x30 sec Supine hip cross over stretch Bil 3x 20 sec Bil Low trunk rotation: 3x20 seconds  Supine TA with concurrent Ball squeeze 2x5 sec: initially pt would bulge at abdominals, improved with TC. Sit to stand: holding 5# KB 3x5 Standing hip abduction and extension Bil 2x10 0#. Pt had better hip extension "tapping foot behind her"   DATE: 07/08/22 Nustep level 5 x 7 min with PT present to discuss status Seated hamstring stretch 2x20 sec Seated piriformis stretch 2x20 sec Low trunk rotation: 3x20 seconds  Standing hip abduction and extension with TA contraction 2x10 Sit to/from stand holding 5# kb 2x10 Supine TA activation    DATE: 07/03/2022 Nustep level 5 x 5 min with PT present to discuss status Seated hamstring stretch 2x20 sec Seated piriformis stretch 2x20 sec Standing hip abduction and extension with TA contraction 2x10 Sit to/from stand holding 5# kb 2x10         Trigger Point Dry-Needling  Treatment instructions: Expect mild to moderate muscle soreness. S/S of pneumothorax if dry needled over a lung field, and to seek immediate medical attention should they occur. Patient verbalized understanding of these instructions and education. Patient Consent Given: Yes Education handout provided: Yes Muscles treated: lumbar multifidi, Rt gluteals  Treatment response/outcome: Manual techniques used to identify tight muscle bands and trigger points in the low back.  Once identified, these muscles were treated.  Patient experienced muscle twitch and there was palpable elongation of affected muscles tissue.  Patient reports decreased pain following treatment.   Manual Therapy:  Soft tissue mobilization to encourage further tissue elongation following dry needling.  PATIENT EDUCATION:  Education details: Initiated HEP Person educated:  Patient Education method: Consulting civil engineer, Media planner, Verbal cues, and Handouts Education comprehension: verbalized understanding, returned demonstration, and verbal cues required  HOME EXERCISE PROGRAM: Access Code: [email or web address] URL: [email or web address] Date: 06/10/2022 Prepared by: Candyce Churn  Exercises - Standing Hamstring Stretch on Chair  - 1 x daily - 7 x weekly - 1 sets - 3 reps - 30 hold - Standing Hip Flexor Stretch with Foot Elevated  - 1 x daily - 7 x weekly - 1 sets - 3 reps - 30 hold - Lying Prone  - 1 x daily - 7 x weekly - 1 sets - 1 reps - 2 min hold - Static Prone on Elbows  - 1 x daily - 7 x weekly - 1 sets - 1 reps - 2 min hold - Prone Press Up  - 1 x daily - 7 x weekly - 1 sets - 10 reps - Seated Piriformis Stretch with Trunk Bend  - 1 x daily - 7 x weekly - 1 sets - 3 reps - 30 hold  ASSESSMENT:  CLINICAL IMPRESSION: Pt arrives pain free and continues to do well/better. Increased hold times on stretches. Pt had no difficulty doing this. Small advances for core strength building upon her previous treatment. Pt has tight hips that makes AROM a challenge.   OBJECTIVE IMPAIRMENTS: difficulty walking, decreased ROM, decreased strength, increased muscle spasms, impaired flexibility, impaired sensation, postural dysfunction, and pain.   ACTIVITY LIMITATIONS: carrying, lifting, bending, sitting, standing, squatting, sleeping, transfers, bathing, and dressing  PARTICIPATION LIMITATIONS: meal prep, cleaning, laundry, shopping, and community activity  PERSONAL FACTORS: Fitness and 1 comorbidity: pending gynecological surgery scheduled in January  are also affecting patient's functional outcome.   REHAB POTENTIAL: Good  CLINICAL DECISION MAKING: Stable/uncomplicated  EVALUATION COMPLEXITY: Low   GOALS: Goals reviewed with patient? Yes  SHORT TERM GOALS: Target date: 07/07/2022    Pain report to be no greater than 4/10  Baseline: Goal status: IN  PROGRESS  2.  Patient will be independent with initial HEP  Baseline:  Goal status: MET  3.  Patient to report 50% improvement in overall symptoms Baseline: 70% (07/08/22) Goal status: MET   LONG TERM GOALS: Target date: 08/04/2022    Patient to be independent with advanced HEP  Baseline:  Goal status: INITIAL  2.  Patient to report pain no greater than 2/10  Baseline:  Goal status: INITIAL  3.  Patient to report 85% improvement in overall symptoms  Baseline: 60-70% (07/08/22) Goal status: in progress   4.  FOTO score to be 63 Baseline: 57 Goal status: INITIAL  5.  5 times sit to stand and TUG to improve by 3-5 sec Baseline:  Goal status: INITIAL  6.  Radicular symptoms to be centralized Baseline: mostly back, mild into leg that is intermittent (07/08/22) Goal status: In progress   PLAN:  PT FREQUENCY: 1-2x/week  PT DURATION: 8 weeks  PLANNED INTERVENTIONS: Therapeutic exercises, Therapeutic activity, Neuromuscular re-education, Balance training, Gait training, Patient/Family education, Self Care, Joint mobilization, Stair training, Aquatic Therapy, Dry Needling, Electrical stimulation, Spinal mobilization, Cryotherapy, Moist heat, Taping, Traction, Ultrasound, Ionotophoresis '4mg'$ /ml Dexamethasone, Manual therapy, and Re-evaluation.  PLAN FOR NEXT SESSION:  core strength, extension based exercise, DN as helpful   Myrene Galas, PTA 07/10/22 11:43 AM   [facility] [address] [place], [place] [address] Phone # [phone] Fax [phone]

## 2022-07-13 ENCOUNTER — Ambulatory Visit: Payer: PPO

## 2022-07-13 DIAGNOSIS — M5416 Radiculopathy, lumbar region: Secondary | ICD-10-CM

## 2022-07-13 DIAGNOSIS — M6281 Muscle weakness (generalized): Secondary | ICD-10-CM

## 2022-07-13 DIAGNOSIS — R262 Difficulty in walking, not elsewhere classified: Secondary | ICD-10-CM

## 2022-07-13 DIAGNOSIS — R252 Cramp and spasm: Secondary | ICD-10-CM

## 2022-07-13 NOTE — Therapy (Signed)
OUTPATIENT PHYSICAL THERAPY TREATMENT NOTE   Patient Name: [name] MRN: [id number] DOB:[birth year], 72 y.o., female Today's Date: 07/13/2022  END OF SESSION:  PT End of Session - 07/13/22 1106     Visit Number 10    Date for PT Re-Evaluation 08/04/22    Authorization Type [employer]    Progress Note Due on Visit 20    PT Start Time 1101    PT Stop Time 1145    PT Time Calculation (min) 44 min    Activity Tolerance Patient tolerated treatment well    Behavior During Therapy [facility] for tasks assessed/performed                Progress Note Reporting Period 06/09/22 to 07/13/22  See note below for Objective Data and Assessment of Progress/Goals.       Past Medical History:  Diagnosis Date   Asthma    has rescue inhaler, now on Flovent   Chronic back pain    Diabetes mellitus without complication ([place])    on medformin, on Ozempic   GERD (gastroesophageal reflux disease)    History of COVID-19 04/05/2021   Hyperlipemia    Hypertension    Hypothyroidism    Insomnia    OAB (overactive bladder)    Seasonal allergies    Sleep apnea    uses a cpap   Past Surgical History:  Procedure Laterality Date   ANKLE ARTHROTOMY  2003   left-fx   APPENDECTOMY     back fusion  02/14/2019   L4 back fusion   BACK SURGERY  2010   lumb disk   BREAST LUMPECTOMY WITH RADIOACTIVE SEED LOCALIZATION Left 06/13/2019   Procedure: LEFT BREAST LUMPECTOMY WITH RADIOACTIVE SEED LOCALIZATION;  Surgeon: Erroll Luna, MD;  Location: [place];  Service: General;  Laterality: Left;   CARPAL TUNNEL RELEASE     rt   CARPAL TUNNEL RELEASE Left 08/01/2013   Procedure: LEFT CARPAL TUNNEL RELEASE;  Surgeon: Wynonia Sours, MD;  Location: [place];  Service: Orthopedics;  Laterality: Left;   CESAREAN SECTION  83,86   COLONOSCOPY     DILATATION & CURETTAGE/HYSTEROSCOPY WITH MYOSURE N/A 08/23/2018   Procedure: DILATATION & CURETTAGE/HYSTEROSCOPY WITH  MYOSURE POLYPECTOMY;  Surgeon: Nunzio Cobbs, MD;  Location: [facility];  Service: Gynecology;  Laterality: N/A;  fibroid resection   DILATION AND CURETTAGE OF UTERUS     x2   MASS EXCISION N/A 08/04/2017   Procedure: EXCISION LIPOMA UPPER BACK x2 AND NECK;  Surgeon: Erroll Luna, MD;  Location: [place];  Service: General;  Laterality: N/A;   NASAL SEPTOPLASTY W/ TURBINOPLASTY Bilateral 06/21/2015   Procedure: NASAL SEPTOPLASTY WITH BILATERAL TURBINATE REDUCTION;  Surgeon: Jerrell Belfast, MD;  Location: [place];  Service: ENT;  Laterality: Bilateral;   SINOSCOPY     TONSILLECTOMY     TUBAL LIGATION     VULVECTOMY N/A 12/24/2021   Procedure: WIDE EXCISION VULVECTOMY;  Surgeon: [name], MD;  Location[facility];  Service: Gynecology;  Laterality: N/A;   Patient Active Problem List   Diagnosis Date Noted   Hypothyroidism 01/22/2022   Insomnia 01/22/2022   Lipoma 01/22/2022   Major depression, single episode 01/22/2022   Morbid obesity ([place]) 01/22/2022   Overactive bladder 01/22/2022   Type 2 diabetes mellitus without complications ([place]) 62/69/4854   Allergic rhinitis due to pollen 01/22/2022   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[place]) 08/30/2021   BMI  37.0-37.9, adult 08/30/2021   Spondylolisthesis of lumbosacral region 02/14/2019   Mild persistent asthma without complication 16/04/9603   Mixed rhinitis 06/10/2018   Obstructive sleep apnea 12/11/2016   Palpitations 06/17/2016   Deflected nasal septum 09/13/2015   Hypertrophy of nasal turbinates 09/13/2015   Deviated nasal septum 06/21/2015    Class: Chronic   HYPERLIPIDEMIA 11/18/2007   Essential hypertension 11/18/2007   Allergic rhinitis 11/18/2007   COUGH 11/18/2007    PCP: Saintclair Halsted, FNP   REFERRING PROVIDER: Starling Manns, MD  REFERRING DIAG: M54.16 (ICD-10-CM) - Radiculopathy, lumbar region  Rationale for Evaluation and Treatment: Rehabilitation  THERAPY  DIAG:  M54.16 (ICD-10-CM) - Radiculopathy, lumbar region  ONSET DATE: 06/08/2022  SUBJECTIVE:                                                                                                                                                                                           SUBJECTIVE STATEMENT:  I overdid it a bit over the weekend with hosting an event at my house.    PERTINENT HISTORY:  Scheduled for surgery for other gynecological issue on 07/29/22  PAIN:  Are you having pain? Yes: NPRS scale: 2/10 Pain location: low back and right leg Pain description: aching and the leg starts burning Aggravating factors: driving Relieving factors: Tylenol, gabapentin, baclofen  PRECAUTIONS: None  WEIGHT BEARING RESTRICTIONS: No  FALLS:  Has patient fallen in last 6 months? No  LIVING ENVIRONMENT: Lives with: lives with their family Lives in: House/apartment  OCCUPATION: retired Marine scientist   PLOF: Independent, Independent with basic ADLs, Independent with household mobility without device, Independent with community mobility without device, Independent with gait, and Independent with transfers  PATIENT GOALS: To eliminate leg pain and avoid further problems with my back.   NEXT MD VISIT: as needed  OBJECTIVE:   DIAGNOSTIC FINDINGS:  none  PATIENT SURVEYS:  FOTO 56 (goal is 38) 07/13/22: 59 FOTO  SCREENING FOR RED FLAGS: Bowel or bladder incontinence: No Spinal tumors: No Cauda equina syndrome: No Compression fracture: No Abdominal aneurysm: No  COGNITION: Overall cognitive status: Within functional limits for tasks assessed     SENSATION: Light touch: Impaired   MUSCLE LENGTH: Hamstrings: Right 60 deg; Left 60 deg Thomas test: Right pos ; Left pos  POSTURE:  Acquired scoliosis lumbar   LOWER EXTREMITY ROM:     WFL  LOWER EXTREMITY MMT:    Generally 4 to 4+/5 with exception  LUMBAR SPECIAL TESTS:  Straight leg raise test: Negative  FUNCTIONAL TESTS:   Eval: 5 times sit to stand: 14.67 Timed up and go (TUG): 9.52  GAIT: Distance  walked: 50 Assistive device utilized: None Level of assistance: Complete Independence Comments: antalgic   TODAY'S TREATMENT:      07/13/21: Nustep level 5 x 7 min with PT  present to discuss status Seated hamstring stretch 2x30 sec Seated piriformis stretch 2x30 sec Supine hip cross over stretch Bil 3x 20 sec Bil Low trunk rotation: 3x20 seconds  Supine TA with concurrent Ball squeeze 2x10sec: verbal cues for TA activation Sit to stand: holding 5# KB 2x10 Trigger Point Dry-Needling  Treatment instructions: Expect mild to moderate muscle soreness. S/S of pneumothorax if dry needled over a lung field, and to seek immediate medical attention should they occur. Patient verbalized understanding of these instructions and education.  Patient Consent Given: Yes Education handout provided: Previously provided Muscles treated: bil gluteals, lumbar paraspinals bil. Treatment response/outcome: Utilized skilled palpation to identify trigger points.  During dry needling able to palpate muscle twitch and muscle elongation.  Skilled palpation and monitoring by PT during dry needling   07/10/21: Nustep level 5 x 7 min with PTA  present to discuss status Seated hamstring stretch 2x30 sec Seated piriformis stretch 2x30 sec Supine hip cross over stretch Bil 3x 20 sec Bil Low trunk rotation: 3x20 seconds  Supine TA with concurrent Ball squeeze 2x5 sec: initially pt would bulge at abdominals, improved with TC. Sit to stand: holding 5# KB 3x5 Standing hip abduction and extension Bil 2x10 0#. Pt had better hip extension "tapping foot behind her"   DATE: 07/08/22 Nustep level 5 x 7 min with PT present to discuss status Seated hamstring stretch 2x20 sec Seated piriformis stretch 2x20 sec Low trunk rotation: 3x20 seconds  Standing hip abduction and extension with TA contraction 2x10 Sit to/from stand holding 5# kb  2x10 Supine TA activation    PATIENT EDUCATION:  Education details: Initiated HEP Person educated: Patient Education method: Consulting civil engineer, Media planner, Verbal cues, and Handouts Education comprehension: verbalized understanding, returned demonstration, and verbal cues required  HOME EXERCISE PROGRAM: Access Code: [email or web address] URL: [email or web address] Date: 06/10/2022 Prepared by: Candyce Churn  Exercises - Standing Hamstring Stretch on Chair  - 1 x daily - 7 x weekly - 1 sets - 3 reps - 30 hold - Standing Hip Flexor Stretch with Foot Elevated  - 1 x daily - 7 x weekly - 1 sets - 3 reps - 30 hold - Lying Prone  - 1 x daily - 7 x weekly - 1 sets - 1 reps - 2 min hold - Static Prone on Elbows  - 1 x daily - 7 x weekly - 1 sets - 1 reps - 2 min hold - Prone Press Up  - 1 x daily - 7 x weekly - 1 sets - 10 reps - Seated Piriformis Stretch with Trunk Bend  - 1 x daily - 7 x weekly - 1 sets - 3 reps - 30 hold  ASSESSMENT:  CLINICAL IMPRESSION: Pt reports 60-70% overall improvement in frequency of back pain since the start of care.  Pt continues to experience high levels of pain at times when she is more active and stands for long periods.  Pt is independent in and compliant with HEP.  FOTO is improved, progress toward goal.  Pain is more centralized with reduced leg pain vs initial visit.  Pt is challenged with TA activation with reduced abdominal bulge. Twitch response and improved tissue mobility after DN and manual therapy today. Patient will benefit from skilled PT to address the below impairments and improve overall function.  OBJECTIVE IMPAIRMENTS: difficulty walking, decreased ROM, decreased strength, increased muscle spasms, impaired flexibility, impaired sensation, postural dysfunction, and pain.   ACTIVITY LIMITATIONS: carrying, lifting, bending, sitting, standing, squatting, sleeping, transfers, bathing, and dressing  PARTICIPATION LIMITATIONS: meal prep,  cleaning, laundry, shopping, and community activity  PERSONAL FACTORS: Fitness and 1 comorbidity: pending gynecological surgery scheduled in January  are also affecting patient's functional outcome.   REHAB POTENTIAL: Good  CLINICAL DECISION MAKING: Stable/uncomplicated  EVALUATION COMPLEXITY: Low   GOALS: Goals reviewed with patient? Yes  SHORT TERM GOALS: Target date: 07/07/2022    Pain report to be no greater than 4/10  Baseline: Goal status: IN PROGRESS  2.  Patient will be independent with initial HEP  Baseline:  Goal status: MET  3.  Patient to report 50% improvement in overall symptoms Baseline: 70% (07/08/22) Goal status: MET   LONG TERM GOALS: Target date: 08/04/2022    Patient to be independent with advanced HEP  Baseline:  Goal status: In progress   2.  Patient to report pain no greater than 2/10  Baseline: 7/10 when very active (07/13/22) Goal status: in progress   3.  Patient to report 85% improvement in overall symptoms  Baseline: 60-70% (07/13/22) Goal status: in progress   4.  FOTO score to be 63 Baseline: 59 (07/13/22) Goal status: In progress   5.  5 times sit to stand and TUG to improve by 3-5 sec Baseline:  Goal status: INITIAL  6.  Radicular symptoms to be centralized Baseline: mostly back, mild into leg that is intermittent (07/08/22) Goal status: In progress   PLAN:  PT FREQUENCY: 1-2x/week  PT DURATION: 8 weeks  PLANNED INTERVENTIONS: Therapeutic exercises, Therapeutic activity, Neuromuscular re-education, Balance training, Gait training, Patient/Family education, Self Care, Joint mobilization, Stair training, Aquatic Therapy, Dry Needling, Electrical stimulation, Spinal mobilization, Cryotherapy, Moist heat, Taping, Traction, Ultrasound, Ionotophoresis '4mg'$ /ml Dexamethasone, Manual therapy, and Re-evaluation.  PLAN FOR NEXT SESSION:  core strength, extension based exercise, DN as helpful, test TUG and 5x sit to stand  Sigurd Sos,  PT 07/13/22 11:45 AM    [facility] [address], [place], [place] [address] Phone # [phone] Fax [phone]

## 2022-07-14 ENCOUNTER — Encounter (HOSPITAL_BASED_OUTPATIENT_CLINIC_OR_DEPARTMENT_OTHER): Payer: Self-pay | Admitting: Gynecologic Oncology

## 2022-07-14 NOTE — Progress Notes (Addendum)
Spoke w/ via phone for pre-op interview--- Sharyn Dross needs dos----   ISTAT            Lab results------ Current EKG in Epic dated 12/2021 COVID test -----patient states asymptomatic no test needed Arrive at -------0530 NPO after MN NO Solid Food.  Med rec completed Medications to take morning of surgery ----- Norvasc, Flovent, Neurontin, Synthroid, Myrbetriq,  Diabetic medication ----- Patient instructed no nail polish to be worn day of surgery Patient instructed to bring photo id and insurance card day of surgery Patient aware to have Driver (ride ) / caregiver Husband Nadara Mustard   for 24 hours after surgery  Patient Special Instructions ----- Patient is currently taking Ozempic, last dose 07/09/21, patient verbalized understanding to hold Ozempic dose until after surgery. Pre-Op special Istructions ----- Patient verbalized understanding of instructions that were given at this phone interview. Patient denies shortness of breath, chest pain, fever, cough at this phone interview.

## 2022-07-15 ENCOUNTER — Ambulatory Visit: Payer: PPO

## 2022-07-15 DIAGNOSIS — M5416 Radiculopathy, lumbar region: Secondary | ICD-10-CM | POA: Diagnosis not present

## 2022-07-15 DIAGNOSIS — M6281 Muscle weakness (generalized): Secondary | ICD-10-CM

## 2022-07-15 DIAGNOSIS — R262 Difficulty in walking, not elsewhere classified: Secondary | ICD-10-CM

## 2022-07-15 DIAGNOSIS — R252 Cramp and spasm: Secondary | ICD-10-CM

## 2022-07-15 NOTE — Therapy (Signed)
OUTPATIENT PHYSICAL THERAPY TREATMENT NOTE   Patient Name: [name] MRN: [id number] DOB:1951-01-27, 72 y.o., female Today's Date: 07/15/2022  END OF SESSION:  PT End of Session - 07/15/22 1151     Visit Number 11    Date for PT Re-Evaluation 08/04/22    Authorization Type [employer]    Progress Note Due on Visit 20    PT Start Time 1103    PT Stop Time 1146    PT Time Calculation (min) 43 min    Activity Tolerance Patient tolerated treatment well    Behavior During Therapy WFL for tasks assessed/performed                   Past Medical History:  Diagnosis Date   Asthma    has rescue inhaler, now on Flovent   Chronic back pain    Diabetes mellitus without complication ([place])    on medformin, on Ozempic   GERD (gastroesophageal reflux disease)    History of COVID-19 04/05/2021   Hyperlipemia    Hypertension    Hypothyroidism    Insomnia    OAB (overactive bladder)    Seasonal allergies    Sleep apnea    uses a cpap   Past Surgical History:  Procedure Laterality Date   ANKLE ARTHROTOMY  2003   left-fx   APPENDECTOMY     back fusion  02/14/2019   L4 back fusion   BACK SURGERY  2010   lumb disk   BREAST LUMPECTOMY WITH RADIOACTIVE SEED LOCALIZATION Left 06/13/2019   Procedure: LEFT BREAST LUMPECTOMY WITH RADIOACTIVE SEED LOCALIZATION;  Surgeon: Erroll Luna, MD;  Location: [place];  Service: General;  Laterality: Left;   CARPAL TUNNEL RELEASE     rt   CARPAL TUNNEL RELEASE Left 08/01/2013   Procedure: LEFT CARPAL TUNNEL RELEASE;  Surgeon: Wynonia Sours, MD;  Location: [place];  Service: Orthopedics;  Laterality: Left;   CESAREAN SECTION  83,86   COLONOSCOPY     DILATATION & CURETTAGE/HYSTEROSCOPY WITH MYOSURE N/A 08/23/2018   Procedure: DILATATION & CURETTAGE/HYSTEROSCOPY WITH MYOSURE POLYPECTOMY;  Surgeon: Nunzio Cobbs, MD;  Location: [facility];  Service: Gynecology;   Laterality: N/A;  fibroid resection   DILATION AND CURETTAGE OF UTERUS     x2   MASS EXCISION N/A 08/04/2017   Procedure: EXCISION LIPOMA UPPER BACK x2 AND NECK;  Surgeon: Erroll Luna, MD;  Location: [place];  Service: General;  Laterality: N/A;   NASAL SEPTOPLASTY W/ TURBINOPLASTY Bilateral 06/21/2015   Procedure: NASAL SEPTOPLASTY WITH BILATERAL TURBINATE REDUCTION;  Surgeon: Jerrell Belfast, MD;  Location: [place];  Service: ENT;  Laterality: Bilateral;   SINOSCOPY     TONSILLECTOMY     TUBAL LIGATION     VULVECTOMY N/A 12/24/2021   Procedure: WIDE EXCISION VULVECTOMY;  Surgeon: [name], MD;  Location: [facility];  Service: Gynecology;  Laterality: N/A;   Patient Active Problem List   Diagnosis Date Noted   Hypothyroidism 01/22/2022   Insomnia 01/22/2022   Lipoma 01/22/2022   Major depression, single episode 01/22/2022   Morbid obesity ([place]) 01/22/2022   Overactive bladder 01/22/2022   Type 2 diabetes mellitus without complications ([place]) 40/34/7425   Allergic rhinitis due to pollen 01/22/2022   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[place]) 08/30/2021   BMI 37.0-37.9, adult 08/30/2021   Spondylolisthesis of lumbosacral region 02/14/2019   Mild persistent asthma without complication 95/63/8756   Mixed  rhinitis 06/10/2018   Obstructive sleep apnea 12/11/2016   Palpitations 06/17/2016   Deflected nasal septum 09/13/2015   Hypertrophy of nasal turbinates 09/13/2015   Deviated nasal septum 06/21/2015    Class: Chronic   HYPERLIPIDEMIA 11/18/2007   Essential hypertension 11/18/2007   Allergic rhinitis 11/18/2007   COUGH 11/18/2007    PCP: Saintclair Halsted, FNP   REFERRING PROVIDER: Starling Manns, MD  REFERRING DIAG: M54.16 (ICD-10-CM) - Radiculopathy, lumbar region  Rationale for Evaluation and Treatment: Rehabilitation  THERAPY DIAG:  M54.16 (ICD-10-CM) - Radiculopathy, lumbar region  ONSET DATE: 06/08/2022  SUBJECTIVE:                                                                                                                                                                                            SUBJECTIVE STATEMENT:  I am feeling pretty good today.  I have a biopsy procedure on Friday.    PERTINENT HISTORY:  Scheduled for surgery for other gynecological issue on 07/29/22  PAIN:  Are you having pain? Yes: NPRS scale: 2/10 Pain location: low back and right leg Pain description: aching and the leg starts burning Aggravating factors: driving Relieving factors: Tylenol, gabapentin, baclofen  PRECAUTIONS: None  WEIGHT BEARING RESTRICTIONS: No  FALLS:  Has patient fallen in last 6 months? No  LIVING ENVIRONMENT: Lives with: lives with their family Lives in: House/apartment  OCCUPATION: retired Marine scientist   PLOF: Independent, Independent with basic ADLs, Independent with household mobility without device, Independent with community mobility without device, Independent with gait, and Independent with transfers  PATIENT GOALS: To eliminate leg pain and avoid further problems with my back.   NEXT MD VISIT: as needed  OBJECTIVE:   DIAGNOSTIC FINDINGS:  none  PATIENT SURVEYS:  FOTO 56 (goal is 27) 07/13/22: 64 FOTO  SCREENING FOR RED FLAGS: Bowel or bladder incontinence: No Spinal tumors: No Cauda equina syndrome: No Compression fracture: No Abdominal aneurysm: No  COGNITION: Overall cognitive status: Within functional limits for tasks assessed     SENSATION: Light touch: Impaired   MUSCLE LENGTH: Hamstrings: Right 60 deg; Left 60 deg Thomas test: Right pos ; Left pos  POSTURE:  Acquired scoliosis lumbar   LOWER EXTREMITY ROM:    WFL  LOWER EXTREMITY MMT:    Generally 4 to 4+/5 with exception  LUMBAR SPECIAL TESTS:  Straight leg raise test: Negative  FUNCTIONAL TESTS:  Eval: 5 times sit to stand: 14.67 Timed up and go (TUG): 9.52   07/15/22: 5x sit to stand: 12.2 seconds  GAIT: Distance  walked: 50 Assistive device utilized: None Level of assistance: Complete Independence Comments: antalgic  TODAY'S TREATMENT:      07/15/21: Nustep level 5 x 7 min with PT  present to discuss status Seated hamstring stretch 2x30 sec Seated piriformis stretch 2x30 sec Low trunk rotation: 3x20 seconds  Supine TA with concurrent Ball squeeze 2x10sec: verbal cues for TA activation Supine TA with concurrent hip abduction with yellow band 2x10sec: verbal cues for TA activation Sit to stand: holding 5# KB 2x10 Standing hip abduction 2x10 07/13/21: Nustep level 5 x 7 min with PT  present to discuss status Seated hamstring stretch 2x30 sec Seated piriformis stretch 2x30 sec Supine hip cross over stretch Bil 3x 20 sec Bil Low trunk rotation: 3x20 seconds  Supine TA with concurrent Ball squeeze 2x10sec: verbal cues for TA activation Sit to stand: holding 5# KB 2x10 Trigger Point Dry-Needling  Treatment instructions: Expect mild to moderate muscle soreness. S/S of pneumothorax if dry needled over a lung field, and to seek immediate medical attention should they occur. Patient verbalized understanding of these instructions and education.  Patient Consent Given: Yes Education handout provided: Previously provided Muscles treated: bil gluteals, lumbar paraspinals bil. Treatment response/outcome: Utilized skilled palpation to identify trigger points.  During dry needling able to palpate muscle twitch and muscle elongation.  Skilled palpation and monitoring by PT during dry needling   07/10/21: Nustep level 5 x 7 min with PTA  present to discuss status Seated hamstring stretch 2x30 sec Seated piriformis stretch 2x30 sec Supine hip cross over stretch Bil 3x 20 sec Bil Low trunk rotation: 3x20 seconds  Supine TA with concurrent Ball squeeze 2x5 sec: initially pt would bulge at abdominals, improved with TC. Sit to stand: holding 5# KB 3x5 Standing hip abduction and extension Bil 2x10 0#. Pt had  better hip extension "tapping foot behind her"   PATIENT EDUCATION:  Education details: Initiated HEP Person educated: Patient Education method: Consulting civil engineer, Media planner, Verbal cues, and Handouts Education comprehension: verbalized understanding, returned demonstration, and verbal cues required  HOME EXERCISE PROGRAM: Access Code: [email or web address] URL: [email or web address] Date: 06/10/2022 Prepared by: Candyce Churn  Exercises - Standing Hamstring Stretch on Chair  - 1 x daily - 7 x weekly - 1 sets - 3 reps - 30 hold - Standing Hip Flexor Stretch with Foot Elevated  - 1 x daily - 7 x weekly - 1 sets - 3 reps - 30 hold - Lying Prone  - 1 x daily - 7 x weekly - 1 sets - 1 reps - 2 min hold - Static Prone on Elbows  - 1 x daily - 7 x weekly - 1 sets - 1 reps - 2 min hold - Prone Press Up  - 1 x daily - 7 x weekly - 1 sets - 10 reps - Seated Piriformis Stretch with Trunk Bend  - 1 x daily - 7 x weekly - 1 sets - 3 reps - 30 hold  ASSESSMENT:  CLINICAL IMPRESSION: Pt arrived with little pain today.  5x sit to stand is improved by 2 seconds from evaluation. Pt is independent in and compliant with HEP.  Pain is more centralized with reduced leg pain vs initial visit.  Pt fatigues with exercises today. Patient will benefit from skilled PT to address the below impairments and improve overall function.   OBJECTIVE IMPAIRMENTS: difficulty walking, decreased ROM, decreased strength, increased muscle spasms, impaired flexibility, impaired sensation, postural dysfunction, and pain.   ACTIVITY LIMITATIONS: carrying, lifting, bending, sitting, standing, squatting, sleeping, transfers, bathing, and dressing  PARTICIPATION LIMITATIONS: meal prep, cleaning, laundry, shopping,  and community activity  PERSONAL FACTORS: Fitness and 1 comorbidity: pending gynecological surgery scheduled in January  are also affecting patient's functional outcome.   REHAB POTENTIAL: Good  CLINICAL DECISION  MAKING: Stable/uncomplicated  EVALUATION COMPLEXITY: Low   GOALS: Goals reviewed with patient? Yes  SHORT TERM GOALS: Target date: 07/07/2022    Pain report to be no greater than 4/10  Baseline: Goal status: IN PROGRESS  2.  Patient will be independent with initial HEP  Baseline:  Goal status: MET  3.  Patient to report 50% improvement in overall symptoms Baseline: 70% (07/08/22) Goal status: MET   LONG TERM GOALS: Target date: 08/04/2022    Patient to be independent with advanced HEP  Baseline:  Goal status: In progress   2.  Patient to report pain no greater than 2/10  Baseline: 7/10 when very active (07/13/22) Goal status: in progress   3.  Patient to report 85% improvement in overall symptoms  Baseline: 60-70% (07/13/22) Goal status: in progress   4.  FOTO score to be 63 Baseline: 59 (07/13/22) Goal status: In progress   5.  5 times sit to stand and TUG to improve by 3-5 sec Baseline: 5x sit to stand 12.2 seconds, improved by 2 seconds (07/15/22) Goal status: In progress   6.  Radicular symptoms to be centralized Baseline: mostly back, mild into leg that is intermittent (07/08/22) Goal status: In progress   PLAN:  PT FREQUENCY: 1-2x/week  PT DURATION: 8 weeks  PLANNED INTERVENTIONS: Therapeutic exercises, Therapeutic activity, Neuromuscular re-education, Balance training, Gait training, Patient/Family education, Self Care, Joint mobilization, Stair training, Aquatic Therapy, Dry Needling, Electrical stimulation, Spinal mobilization, Cryotherapy, Moist heat, Taping, Traction, Ultrasound, Ionotophoresis '4mg'$ /ml Dexamethasone, Manual therapy, and Re-evaluation.  PLAN FOR NEXT SESSION:  core strength, extension based exercise, DN as helpful, test TUG   Sigurd Sos, PT 07/15/22 11:54 AM    [facility] [address] [place], [place] [address] Phone # [phone] Fax [phone]

## 2022-07-16 NOTE — Anesthesia Preprocedure Evaluation (Addendum)
Anesthesia Evaluation  Patient identified by MRN, date of birth, ID band Patient awake    Reviewed: Allergy & Precautions, NPO status , Patient's Chart, lab work & pertinent test results  History of Anesthesia Complications Negative for: history of anesthetic complications  Airway Mallampati: III  TM Distance: >3 FB Neck ROM: Full  Mouth opening: Limited Mouth Opening  Dental  (+) Dental Advisory Given, Teeth Intact   Pulmonary asthma , sleep apnea    Pulmonary exam normal        Cardiovascular hypertension, Pt. on medications Normal cardiovascular exam     Neuro/Psych  PSYCHIATRIC DISORDERS  Depression    negative neurological ROS     GI/Hepatic Neg liver ROS,GERD  Controlled and Medicated,,  Endo/Other  diabetes, Type 2, Oral Hypoglycemic AgentsHypothyroidism   Obesity   Renal/GU negative Renal ROS Bladder dysfunction      Musculoskeletal negative musculoskeletal ROS (+)    Abdominal   Peds  Hematology negative hematology ROS (+)   Anesthesia Other Findings GLP-1a - held x 1 week  Reproductive/Obstetrics                             Anesthesia Physical Anesthesia Plan  ASA: 2  Anesthesia Plan: General   Post-op Pain Management: Tylenol PO (pre-op)*   Induction: Intravenous  PONV Risk Score and Plan: 3 and Treatment may vary due to age or medical condition, Ondansetron and Dexamethasone  Airway Management Planned: LMA  Additional Equipment: None  Intra-op Plan:   Post-operative Plan: Extubation in OR  Informed Consent: I have reviewed the patients History and Physical, chart, labs and discussed the procedure including the risks, benefits and alternatives for the proposed anesthesia with the patient or authorized representative who has indicated his/her understanding and acceptance.     Dental advisory given  Plan Discussed with: CRNA and  Anesthesiologist  Anesthesia Plan Comments:        Anesthesia Quick Evaluation

## 2022-07-17 ENCOUNTER — Encounter (HOSPITAL_BASED_OUTPATIENT_CLINIC_OR_DEPARTMENT_OTHER): Payer: Self-pay | Admitting: Gynecologic Oncology

## 2022-07-17 ENCOUNTER — Ambulatory Visit (HOSPITAL_BASED_OUTPATIENT_CLINIC_OR_DEPARTMENT_OTHER): Payer: PPO | Admitting: Anesthesiology

## 2022-07-17 ENCOUNTER — Other Ambulatory Visit: Payer: Self-pay

## 2022-07-17 ENCOUNTER — Encounter (HOSPITAL_BASED_OUTPATIENT_CLINIC_OR_DEPARTMENT_OTHER)
Admission: RE | Disposition: A | Discharge status: Home / Self Care | Payer: Self-pay | Source: Home / Self Care | Attending: Gynecologic Oncology

## 2022-07-17 ENCOUNTER — Ambulatory Visit (HOSPITAL_BASED_OUTPATIENT_CLINIC_OR_DEPARTMENT_OTHER)
Admission: RE | Admit: 2022-07-17 | Discharge: 2022-07-17 | Disposition: A | Discharge status: Home / Self Care | Payer: PPO | Attending: Gynecologic Oncology | Admitting: Gynecologic Oncology

## 2022-07-17 DIAGNOSIS — K219 Gastro-esophageal reflux disease without esophagitis: Secondary | ICD-10-CM | POA: Diagnosis not present

## 2022-07-17 DIAGNOSIS — Z7984 Long term (current) use of oral hypoglycemic drugs: Secondary | ICD-10-CM | POA: Diagnosis not present

## 2022-07-17 DIAGNOSIS — C519 Malignant neoplasm of vulva, unspecified: Secondary | ICD-10-CM | POA: Insufficient documentation

## 2022-07-17 DIAGNOSIS — N9089 Other specified noninflammatory disorders of vulva and perineum: Secondary | ICD-10-CM | POA: Diagnosis not present

## 2022-07-17 DIAGNOSIS — E119 Type 2 diabetes mellitus without complications: Secondary | ICD-10-CM | POA: Insufficient documentation

## 2022-07-17 DIAGNOSIS — I10 Essential (primary) hypertension: Secondary | ICD-10-CM | POA: Diagnosis not present

## 2022-07-17 DIAGNOSIS — G4733 Obstructive sleep apnea (adult) (pediatric): Secondary | ICD-10-CM | POA: Diagnosis not present

## 2022-07-17 DIAGNOSIS — D039 Melanoma in situ, unspecified: Secondary | ICD-10-CM

## 2022-07-17 LAB — POCT I-STAT, CHEM 8
BUN: 15 mg/dL (ref 8–23)
Calcium, Ion: 1.21 mmol/L (ref 1.15–1.40)
Chloride: 106 mmol/L (ref 98–111)
Creatinine, Ser: 0.6 mg/dL (ref 0.44–1.00)
Glucose, Bld: 112 mg/dL — ABNORMAL HIGH (ref 70–99)
HCT: 38 % (ref 36.0–46.0)
Hemoglobin: 12.9 g/dL (ref 12.0–15.0)
Potassium: 3.4 mmol/L — ABNORMAL LOW (ref 3.5–5.1)
Sodium: 143 mmol/L (ref 135–145)
TCO2: 23 mmol/L (ref 22–32)

## 2022-07-17 LAB — GLUCOSE, CAPILLARY: Glucose-Capillary: 112 mg/dL — ABNORMAL HIGH (ref 70–99)

## 2022-07-17 SURGERY — EXAM UNDER ANESTHESIA
Anesthesia: General

## 2022-07-17 MED ORDER — BUPIVACAINE LIPOSOME 1.3 % IJ SUSP
INTRAMUSCULAR | Status: DC | PRN
  Administered 2022-07-17: 10 mL

## 2022-07-17 MED ORDER — LIDOCAINE 2% (20 MG/ML) 5 ML SYRINGE
INTRAMUSCULAR | Status: DC | PRN
  Administered 2022-07-17: 60 mg via INTRAVENOUS

## 2022-07-17 MED ORDER — MIDAZOLAM HCL 2 MG/2ML IJ SOLN
INTRAMUSCULAR | Status: DC | PRN
  Administered 2022-07-17: 2 mg via INTRAVENOUS

## 2022-07-17 MED ORDER — ONDANSETRON HCL 4 MG PO TABS: 4.0000 mg | ORAL_TABLET | Freq: Four times a day (QID) | ORAL | Status: DC | PRN

## 2022-07-17 MED ORDER — ACETAMINOPHEN 500 MG PO TABS: 1000.0000 mg | ORAL_TABLET | ORAL | Status: DC

## 2022-07-17 MED ORDER — PROPOFOL 10 MG/ML IV BOLUS
INTRAVENOUS | Status: DC | PRN
  Administered 2022-07-17: 200 mg via INTRAVENOUS

## 2022-07-17 MED ORDER — TRAMADOL HCL 50 MG PO TABS: 50.0000 mg | ORAL_TABLET | Freq: Four times a day (QID) | ORAL | Status: DC | PRN

## 2022-07-17 MED ORDER — MIDAZOLAM HCL 2 MG/2ML IJ SOLN
INTRAMUSCULAR | Status: AC
  Filled 2022-07-17: qty 2

## 2022-07-17 MED ORDER — DEXAMETHASONE SODIUM PHOSPHATE 10 MG/ML IJ SOLN
INTRAMUSCULAR | Status: DC | PRN
  Administered 2022-07-17: 5 mg via INTRAVENOUS

## 2022-07-17 MED ORDER — FENTANYL CITRATE (PF) 100 MCG/2ML IJ SOLN
INTRAMUSCULAR | Status: AC
  Filled 2022-07-17: qty 2

## 2022-07-17 MED ORDER — ACETAMINOPHEN 500 MG PO TABS
1000.0000 mg | ORAL_TABLET | Freq: Once | ORAL | Status: AC
  Administered 2022-07-17: 1000 mg via ORAL

## 2022-07-17 MED ORDER — LACTATED RINGERS IV SOLN: INTRAVENOUS | Status: DC

## 2022-07-17 MED ORDER — ONDANSETRON HCL 4 MG/2ML IJ SOLN: 4.0000 mg | Freq: Four times a day (QID) | INTRAMUSCULAR | Status: DC | PRN

## 2022-07-17 MED ORDER — ONDANSETRON HCL 4 MG/2ML IJ SOLN: 4.0000 mg | Freq: Once | INTRAMUSCULAR | Status: DC | PRN

## 2022-07-17 MED ORDER — LIDOCAINE HCL (PF) 1 % IJ SOLN
INTRAMUSCULAR | Status: DC | PRN
  Administered 2022-07-17: 10 mL

## 2022-07-17 MED ORDER — DEXAMETHASONE SODIUM PHOSPHATE 10 MG/ML IJ SOLN: 4.0000 mg | INTRAMUSCULAR | Status: DC

## 2022-07-17 MED ORDER — FENTANYL CITRATE (PF) 100 MCG/2ML IJ SOLN: 25.0000 ug | INTRAMUSCULAR | Status: DC | PRN

## 2022-07-17 MED ORDER — PROPOFOL 10 MG/ML IV BOLUS
INTRAVENOUS | Status: AC
  Filled 2022-07-17: qty 20

## 2022-07-17 MED ORDER — ACETAMINOPHEN 500 MG PO TABS
ORAL_TABLET | ORAL | Status: AC
  Filled 2022-07-17: qty 2

## 2022-07-17 MED ORDER — DEXAMETHASONE SODIUM PHOSPHATE 10 MG/ML IJ SOLN
INTRAMUSCULAR | Status: AC
  Filled 2022-07-17: qty 1

## 2022-07-17 MED ORDER — ONDANSETRON HCL 4 MG/2ML IJ SOLN
INTRAMUSCULAR | Status: AC
  Filled 2022-07-17: qty 2

## 2022-07-17 MED ORDER — LIDOCAINE HCL (PF) 2 % IJ SOLN
INTRAMUSCULAR | Status: AC
  Filled 2022-07-17: qty 5

## 2022-07-17 MED ORDER — FENTANYL CITRATE (PF) 100 MCG/2ML IJ SOLN
INTRAMUSCULAR | Status: DC | PRN
  Administered 2022-07-17 (×2): 25 ug via INTRAVENOUS
  Administered 2022-07-17: 50 ug via INTRAVENOUS

## 2022-07-17 SURGICAL SUPPLY — 10 items
GLOVE BIO SURGEON STRL SZ 6 (GLOVE) ×2 IMPLANT
KIT TURNOVER CYSTO (KITS) ×1 IMPLANT
NDL HYPO 25X1 1.5 SAFETY (NEEDLE) ×1 IMPLANT
NEEDLE HYPO 25X1 1.5 SAFETY (NEEDLE) ×1 IMPLANT
NS IRRIG 500ML POUR BTL (IV SOLUTION) ×1 IMPLANT
PACK PERINEAL COLD (PAD) ×1 IMPLANT
PACK VAGINAL WOMENS (CUSTOM PROCEDURE TRAY) ×1 IMPLANT
PUNCH BIOPSY 3 (MISCELLANEOUS) IMPLANT
SUT VIC AB 4-0 PS2 18 (SUTURE) IMPLANT
TOWEL OR 17X26 10 PK STRL BLUE (TOWEL DISPOSABLE) ×2 IMPLANT

## 2022-07-17 NOTE — Anesthesia Procedure Notes (Signed)
Procedure Name: LMA Insertion Date/Time: 07/17/2022 7:32 AM  Performed by: Suan Halter, CRNAPre-anesthesia Checklist: Patient identified, Emergency Drugs available, Suction available and Patient being monitored Patient Re-evaluated:Patient Re-evaluated prior to induction Oxygen Delivery Method: Circle system utilized Preoxygenation: Pre-oxygenation with 100% oxygen Induction Type: IV induction Ventilation: Mask ventilation without difficulty LMA: LMA inserted LMA Size: 4.0 Number of attempts: 1 Airway Equipment and Method: Bite block Placement Confirmation: positive ETCO2 Tube secured with: Tape Dental Injury: Teeth and Oropharynx as per pre-operative assessment

## 2022-07-17 NOTE — Discharge Instructions (Addendum)
AFTER SURGERY INSTRUCTIONS   Return to work:  2-3 DAYS if applicable   Activity: 1. Be up and out of the bed during the day.  Take a nap if needed.  You may walk up steps but be careful and use the hand rail.  Stair climbing will tire you more than you think, you may need to stop part way and rest.    2. No lifting or straining for 2 weeks over 10 pounds. No pushing, pulling, straining for 2 weeks.   3. No driving for minimum 24 hours after surgery.  Do not drive if you are taking narcotic pain medicine and make sure that your reaction time has returned.    4. You can shower as soon as the next day after surgery. Shower daily. No tub baths or submerging your body in water until cleared by your surgeon. If you have the soap that was given to you by pre-surgical testing that was used before surgery, you do not need to use it afterwards because this can irritate your incisions.    5. No sexual activity and nothing in the vagina for 4 weeks.   6. You may experience vaginal spotting and discharge after surgery.  The spotting is normal but if you experience heavy bleeding, call our office.   7. Take Tylenol or ibuprofen (OR MELOXICAM (MOBIC)) first for pain if you are able to take these medications and only use narcotic pain medication for severe pain not relieved by the Tylenol or Ibuprofen.  Monitor your Tylenol intake to a max of 4,000 mg in a 24 hour period. You can alternate these medications after surgery.   Diet: 1. Low sodium Heart Healthy Diet is recommended but you are cleared to resume your normal (before surgery) diet after your procedure.   2. It is safe to use a laxative, such as Miralax or Colace, if you have difficulty moving your bowels.     Wound Care: 1. Keep clean and dry.  Shower daily.   Reasons to call the Doctor: Fever - Oral temperature greater than 100.4 degrees Fahrenheit Foul-smelling vaginal discharge Difficulty urinating Nausea and vomiting Increased pain at  the site of the incision that is unrelieved with pain medicine. Difficulty breathing with or without chest pain New calf pain especially if only on one side Sudden, continuing increased vaginal bleeding with or without clots.   Contacts: For questions or concerns you should contact:   Dr. Jeral Pinch at [phone]   Joylene John, NP at [phone]   After Hours: call [phone] and have the GYN Oncologist paged/contacted (after 5 pm or on the weekends).   Messages sent via mychart are for non-urgent matters and are not responded to after hours so for urgent needs, please call the after hours number.    No acetaminophen/Tylenol until after 12:00pm today if needed for pain.    Post Anesthesia Home Care Instructions  Activity: Get plenty of rest for the remainder of the day. A responsible individual must stay with you for 24 hours following the procedure.  For the next 24 hours, DO NOT: -Drive a car -Paediatric nurse -Drink alcoholic beverages -Take any medication unless instructed by your physician -Make any legal decisions or sign important papers.  Meals: Start with liquid foods such as gelatin or soup. Progress to regular foods as tolerated. Avoid greasy, spicy, heavy foods. If nausea and/or vomiting occur, drink only clear liquids until the nausea and/or vomiting subsides. Call your physician if vomiting continues.  Special  Instructions/Symptoms: Your throat may feel dry or sore from the anesthesia or the breathing tube placed in your throat during surgery. If this causes discomfort, gargle with warm salt water. The discomfort should disappear within 24 hours.

## 2022-07-17 NOTE — Anesthesia Postprocedure Evaluation (Signed)
Anesthesia Post Note  Patient: [name]  Procedure(s) Performed: PELVIC EXAM UNDER ANESTHESIA, VULVAR BIOPSIES     Patient location during evaluation: PACU Anesthesia Type: General Level of consciousness: awake and alert Pain management: pain level controlled Vital Signs Assessment: post-procedure vital signs reviewed and stable Respiratory status: spontaneous breathing, nonlabored ventilation and respiratory function stable Cardiovascular status: stable and blood pressure returned to baseline Anesthetic complications: no   No notable events documented.  Last Vitals:  Vitals:   07/17/22 0830 07/17/22 0845  BP: 127/73 (!) 147/79  Pulse: (!) 57 60  Resp: 13 16  Temp: 36.6 C 36.6 C  SpO2: 94% 96%    Last Pain:  Vitals:   07/17/22 0845  TempSrc:   PainSc: 0-No pain                 Audry Pili

## 2022-07-17 NOTE — Transfer of Care (Signed)
  Immediate Anesthesia Transfer of Care Note  Patient: [name]  Procedure(s) Performed: Procedure(s) (LRB): PELVIC EXAM UNDER ANESTHESIA, VULVAR BIOPSIES (N/A)  Patient Location: PACU  Anesthesia Type: General  Level of Consciousness: awake, oriented, sedated and patient cooperative  Airway & Oxygen Therapy: Patient Spontanous Breathing and Patient connected to face mask oxygen  Post-op Assessment: Report given to PACU RN and Post -op Vital signs reviewed and stable  Post vital signs: Reviewed and stable  Complications: No apparent anesthesia complications Last Vitals:  Vitals Value Taken Time  BP 133/61 07/17/22 0801  Temp    Pulse 61 07/17/22 0804  Resp 16 07/17/22 0804  SpO2 95 % 07/17/22 0804  Vitals shown include unvalidated device data.  Last Pain:  Vitals:   07/17/22 0537  TempSrc: Oral      Patients Stated Pain Goal: 6 (35/59/74 1638)  Complications: No notable events documented.

## 2022-07-17 NOTE — Brief Op Note (Signed)
07/17/2022  7:54 AM  PATIENT:  [name]  72 y.o. female  PRE-OPERATIVE DIAGNOSIS:  vulvar melanoma  POST-OPERATIVE DIAGNOSIS:  vulvar melanoma  PROCEDURE:  Procedure(s): PELVIC EXAM UNDER ANESTHESIA, VULVAR BIOPSIES (N/A)  SURGEON:  Surgeon(s) and Role:    * [name], MD - Primary  ANESTHESIA:   LMA  EBL:  15 mL   BLOOD ADMINISTERED:none  DRAINS: none   LOCAL MEDICATIONS USED:  Exparel with lidocaine  SPECIMEN:  multiple left vulvar and peri-clitral biopsies  DISPOSITION OF SPECIMEN:  PATHOLOGY  COUNTS:  YES  TOURNIQUET:  * No tourniquets in log *  DICTATION: .Note written in EPIC  PLAN OF CARE: Discharge to home after PACU  PATIENT DISPOSITION:  PACU - hemodynamically stable.   Delay start of Pharmacological VTE agent (>24hrs) due to surgical blood loss or risk of bleeding: not applicable

## 2022-07-17 NOTE — Op Note (Signed)
Operative Report  PATIENT: [name] DATE: 07/17/22  Preop Diagnosis: History of vulvar melanoma and MIS, s/p recent treatment with Aldara for melanoma in situ  Postoperative Diagnosis: same as above  Surgery: Left anterior vulvar and peri-clitoral biopsies  Surgeons:  Valarie Cones MD  Assistant: none  Anesthesia: General   Estimated blood loss: 15 ml  IVF:  see I&O flowsheet  Urine output: n/a   Complications: None apparent  Pathology: Multiple vulvar biopsies, lateral left labia at 1, 2, and 3 o'c. Clitoral hood biopsy and peri-clitoral (just to left lateral aspect) biopsy. Medial aspect of left vulva at 2 o'c. Mid lesion biopsy.    Operative findings: No vulvar lesions or discoloration, well healed prior vulvectomy site.  Procedure: The patient was identified in the preoperative holding area. Informed consent was signed on the chart. Patient was seen history was reviewed and exam was performed.   The patient was then taken to the operating room and placed in the supine position with SCD hose on. General anesthesia was then induced without difficulty. She was then placed in the dorsolithotomy position. The perineum was prepped with Betadine. The vagina was prepped with Betadine. The patient was then draped after the prep was dried.  Timeout was performed the patient, procedure, antibiotic, allergy, and length of procedure. Multiple vulvar biopsies were taken. The bovie was used to obtain hemostasis at the surgical bed. The subcutaneous tissues were irrigated and made hemostatic. Lateral biopsy at 1 o'c required two interrupted 4-0 Vicryl stitches to achieve hemostasis. 20 cc of Exparel with 1% lidocaine was injected for local anesthesia.   All instrument, suture, laparotomy, Ray-Tec, and needle counts were correct x2. The patient tolerated the procedure well and was taken recovery room in stable condition.   Jeral Pinch MD Gynecologic Oncology

## 2022-07-17 NOTE — H&P (Signed)
Gynecologic Oncology H&P  07/17/21  Treatment History: 08/12/21: Vulvar biopsy revealed vulvar melanoma   09/12/21: Mild hypermetabolism in the lower vulva likely related to recent biopsy/surgery.  No discrete hypermetabolic mass.  No adenopathy.   11/04/2021: Left vulvar wide local excision and left inguinal sentinel lymph node biopsy with Surgical Oncology. Operative Findings: 1 cm melanotic lesion on medial aspect of anterior left labia minora. 1 sentinel lymph node identified in the subcutaneous fat superficial to the external oblique.  Pathology:  Diagnosis  A: Sentinel lymph node, left inguinal, removal - Negative for melanoma in one lymph node (0/1). B: Vulva, excision - Malignant melanoma. Melanoma in situ extends broadly to 11-12-10 o'clock peripheral margin and focally to the 3 o'clock peripheral margin. Margins are negative for invasive melanoma. Type: Vulvar Clark Level: IV Breslow thickness: 1.8 mm Growth phase: Vertical, spindled melanocytes with light pigment Mitoses: per square millimeter Nuclear grade: 2/3 Tumor infiltrating lymphocytes:  Regression: Not identified Ulceration: Not identified  Microscopic satellite: Not identified Vascular invasion: Not identified Perineural invasion: Not identified Associated nevus: Not identified  Margins: Melanoma in situ extends broadly to 11-12-10 o'clock peripheral margin (B3-B5) and focally to the 3 o'clock peripheral margin (B3). Margins are negative for invasive melanoma Pathologic (pT, AJCC 8th edition) staging: pT2a C: Vulva, deep margin, excision - Connective tissue.  - Negative for melanoma   11/19/2021 Tumor Board recommendations.  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 Plan: Repeat excision of vulvar melanoma in situ  - Medical Oncology referral for consideration of additional treatment  (observation vs pembro vs locoregional radiation), scheduled 5/22 Medical oncology recommendations: CT C/A/P q 3 months for 1 year then q 6 months until year 3, then annual. Consider imiquimod for the in situ component if the margins on reexcision were to show melanoma in situ. Obtain full molecular profile with [employer] testing - testing showed no targetable mutations or obvious germline mutation.   12/24/2021: Partial simple left anterior vulvectomy given margins positive for vulvar melanoma in situ.  Prior scar identified, no definitive evidence visually of melanoma lesion.  mL in situ noted involving all margins.  No residual invasive disease noted.   02/2022: CT chest, abdomen, pelvis shows multiple bilateral pulmonary nodules including previously described 5 mm left lower lobe nodule.  There is suggestion of mild increase in size of 8 mm left upper lobe nodule.  Nonspecific, recommended close follow-up.  Interval postsurgical changes in the left inguinal region.  Too small to characterize 9 mm low-attenuation lesion in the left hepatic lobe, recommended attention on follow-up.  05/15/22: CT C/A/P - interval increase in size of prominent left external iliac and inguinal lymph nodes including one adjacent to surgical inguinal clips, nonspecific, possibly reactive but suspicious for metastatic disease. Stable bilateral pulmonary nodules.   05/26/22: CT biopsy of left inguinal lymph node. Pathology reveals benign lymph node.  Interval History: Doing well.  Past Medical/Surgical History: Past Medical History:  Diagnosis Date   Asthma    has rescue inhaler, now on Flovent   Chronic back pain    Diabetes mellitus without complication ([place])    on medformin, on Ozempic   GERD (gastroesophageal reflux disease)    History of COVID-19 04/05/2021   Hyperlipemia    Hypertension    Hypothyroidism    Insomnia    OAB (overactive bladder)    Seasonal allergies    Sleep apnea  uses a cpap     Past Surgical History:  Procedure Laterality Date   ANKLE ARTHROTOMY  2003   left-fx   APPENDECTOMY     back fusion  02/14/2019   L4 back fusion   BACK SURGERY  2010   lumb disk   BREAST LUMPECTOMY WITH RADIOACTIVE SEED LOCALIZATION Left 06/13/2019   Procedure: LEFT BREAST LUMPECTOMY WITH RADIOACTIVE SEED LOCALIZATION;  Surgeon: Erroll Luna, MD;  Location: [facility];  Service: General;  Laterality: Left;   CARPAL TUNNEL RELEASE     rt   CARPAL TUNNEL RELEASE Left 08/01/2013   Procedure: LEFT CARPAL TUNNEL RELEASE;  Surgeon: Wynonia Sours, MD;  Location: [facility];  Service: Orthopedics;  Laterality: Left;   CESAREAN SECTION  83,86   COLONOSCOPY     DILATATION & CURETTAGE/HYSTEROSCOPY WITH MYOSURE N/A 08/23/2018   Procedure: DILATATION & CURETTAGE/HYSTEROSCOPY WITH MYOSURE POLYPECTOMY;  Surgeon: Nunzio Cobbs, MD;  Location: [facility];  Service: Gynecology;  Laterality: N/A;  fibroid resection   DILATION AND CURETTAGE OF UTERUS     x2   MASS EXCISION N/A 08/04/2017   Procedure: EXCISION LIPOMA UPPER BACK x2 AND NECK;  Surgeon: Erroll Luna, MD;  Location: [facility];  Service: General;  Laterality: N/A;   NASAL SEPTOPLASTY W/ TURBINOPLASTY Bilateral 06/21/2015   Procedure: NASAL SEPTOPLASTY WITH BILATERAL TURBINATE REDUCTION;  Surgeon: Jerrell Belfast, MD;  Location: [facility];  Service: ENT;  Laterality: Bilateral;   SINOSCOPY     TONSILLECTOMY     TUBAL LIGATION     VULVECTOMY N/A 12/24/2021   Procedure: WIDE EXCISION VULVECTOMY;  Surgeon: [name], MD;  Location: [facility];  Service: Gynecology;  Laterality: N/A;    Family History  Problem Relation Age of Onset   Allergic rhinitis Father    Allergic rhinitis Sister    Asthma Sister    Endometrial cancer Sister 71   Multiple myeloma Sister    Cancer Maternal Grandmother    Asthma Paternal Aunt    Angioedema Neg Hx     Eczema Neg Hx    Immunodeficiency Neg Hx    Urticaria Neg Hx    Ovarian cancer Neg Hx    Breast cancer Neg Hx    Colon cancer Neg Hx    Prostate cancer Neg Hx    Pancreatic cancer Neg Hx     Social History   Socioeconomic History   Marital status: Married    Spouse name: Not on file   Number of children: Not on file   Years of education: Not on file   Highest education level: Not on file  Occupational History   Occupation: retored  Tobacco Use   Smoking status: Never   Smokeless tobacco: Never  Vaping Use   Vaping Use: Never used  Substance and Sexual Activity   Alcohol use: Yes    Comment: 2 glasses of wine/month   Drug use: No   Sexual activity: Yes    Birth control/protection: Post-menopausal  Other Topics Concern   Not on file  Social History Narrative   Not on file   Social Determinants of Health   Financial Resource Strain: Not on file  Food Insecurity: Not on file  Transportation Needs: Not on file  Physical Activity: Not on file  Stress: Not on file  Social Connections: Not on file    Current Medications:  Current Facility-Administered Medications:    dexamethasone (DECADRON) injection 4  mg, 4 mg, Intravenous, On Call to OR, Cross, Melissa D, NP   lactated ringers infusion, , Intravenous, Continuous, Pervis Hocking, DO, Last Rate: 10 mL/hr at 07/17/22 0614, New Bag at 07/17/22 0614  Review of Systems: Denies appetite changes, fevers, chills, fatigue, unexplained weight changes. Denies hearing loss, neck lumps or masses, mouth sores, ringing in ears or voice changes. Denies cough or wheezing.  Denies shortness of breath. Denies chest pain or palpitations. Denies leg swelling. Denies abdominal distention, pain, blood in stools, constipation, diarrhea, nausea, vomiting, or early satiety. Denies pain with intercourse, dysuria, frequency, hematuria or incontinence. Denies hot flashes, pelvic pain, vaginal bleeding or vaginal discharge.   Denies  joint pain, back pain or muscle pain/cramps. Denies itching, rash, or wounds. Denies dizziness, headaches, numbness or seizures. Denies swollen lymph nodes or glands, denies easy bruising or bleeding. Denies anxiety, depression, confusion, or decreased concentration.  Physical Exam: BP (!) 150/81   Pulse (!) 57   Temp 97.6 F (36.4 C) (Oral)   Resp 17   Ht '5\' 6"'$  (1.676 m)   Wt 226 lb 3.2 oz (102.6 kg)   LMP 07/06/1998 (Approximate)   SpO2 98%   BMI 36.51 kg/m  Exam on 11/17 General: Alert, oriented, no acute distress. HEENT: Normocephalic, atraumatic, sclera anicteric. Chest: Unlabored breathing on room air. Lymphatics: No palpable cervical, supraclavicular, or inguinal adenopathy. GU: External female genitalia notable for significant erythema of bilateral outer labia along the anterior vulva with some crusting along the skin.  The clitoris, tissue on the left aspect of the upper vulva are normal in appearance.  Laboratory & Radiologic Studies:    Latest Ref Rng & Units 07/17/2022    6:15 AM 05/15/2022    7:56 AM 02/09/2022    8:16 AM  BMP  Glucose 70 - 99 mg/dL 112     BUN 8 - 23 mg/dL 15     Creatinine 0.44 - 1.00 mg/dL 0.60  0.90  0.90   Sodium 135 - 145 mmol/L 143     Potassium 3.5 - 5.1 mmol/L 3.4     Chloride 98 - 111 mmol/L 106       Assessment & Plan: [name] is a 72 y.o. woman with Stage IB vulvar melanoma who underwent repeat excision in an attempt to achieve negative margins given MIS present. Unfortunately, margins still positive for melanoma in situ. Now 1 month s/p completion of 16 weeks of Aldara treatment.  Plan for vulvar biopsies today to assess treatment response to Aldara.  Jeral Pinch, MD  Division of Gynecologic Oncology  Department of Obstetrics and Gynecology  [facility]

## 2022-07-21 ENCOUNTER — Ambulatory Visit: Payer: PPO

## 2022-07-21 DIAGNOSIS — M6281 Muscle weakness (generalized): Secondary | ICD-10-CM

## 2022-07-21 DIAGNOSIS — R252 Cramp and spasm: Secondary | ICD-10-CM

## 2022-07-21 DIAGNOSIS — M5416 Radiculopathy, lumbar region: Secondary | ICD-10-CM | POA: Diagnosis not present

## 2022-07-21 DIAGNOSIS — R262 Difficulty in walking, not elsewhere classified: Secondary | ICD-10-CM

## 2022-07-21 NOTE — Therapy (Addendum)
OUTPATIENT PHYSICAL THERAPY TREATMENT NOTE   Patient Name: NEELAH MANNINGS MRN: [id number] DOB:1950-08-12, 72 y.o., female Today's Date: 07/21/2022  END OF SESSION:  PT End of Session - 07/21/22 1142     Visit Number 12    Date for PT Re-Evaluation 08/04/22    Authorization Type [employer]    Progress Note Due on Visit 20    PT Start Time 1103    PT Stop Time 1143    PT Time Calculation (min) 40 min    Activity Tolerance Patient tolerated treatment well    Behavior During Therapy WFL for tasks assessed/performed                    Past Medical History:  Diagnosis Date   Asthma    has rescue inhaler, now on Flovent   Chronic back pain    Diabetes mellitus without complication ([place])    on medformin, on Ozempic   GERD (gastroesophageal reflux disease)    History of COVID-19 04/05/2021   Hyperlipemia    Hypertension    Hypothyroidism    Insomnia    OAB (overactive bladder)    Seasonal allergies    Sleep apnea    uses a cpap   Past Surgical History:  Procedure Laterality Date   ANKLE ARTHROTOMY  2003   left-fx   APPENDECTOMY     back fusion  02/14/2019   L4 back fusion   BACK SURGERY  2010   lumb disk   BREAST LUMPECTOMY WITH RADIOACTIVE SEED LOCALIZATION Left 06/13/2019   Procedure: LEFT BREAST LUMPECTOMY WITH RADIOACTIVE SEED LOCALIZATION;  Surgeon: Erroll Luna, MD;  Location: [place];  Service: General;  Laterality: Left;   CARPAL TUNNEL RELEASE     rt   CARPAL TUNNEL RELEASE Left 08/01/2013   Procedure: LEFT CARPAL TUNNEL RELEASE;  Surgeon: Wynonia Sours, MD;  Location: [place];  Service: Orthopedics;  Laterality: Left;   CESAREAN SECTION  83,86   COLONOSCOPY     DILATATION & CURETTAGE/HYSTEROSCOPY WITH MYOSURE N/A 08/23/2018   Procedure: DILATATION & CURETTAGE/HYSTEROSCOPY WITH MYOSURE POLYPECTOMY;  Surgeon: Nunzio Cobbs, MD;  Location: [facility];  Service: Gynecology;   Laterality: N/A;  fibroid resection   DILATION AND CURETTAGE OF UTERUS     x2   MASS EXCISION N/A 08/04/2017   Procedure: EXCISION LIPOMA UPPER BACK x2 AND NECK;  Surgeon: Erroll Luna, MD;  Location: [place];  Service: General;  Laterality: N/A;   NASAL SEPTOPLASTY W/ TURBINOPLASTY Bilateral 06/21/2015   Procedure: NASAL SEPTOPLASTY WITH BILATERAL TURBINATE REDUCTION;  Surgeon: Jerrell Belfast, MD;  Location: [place];  Service: ENT;  Laterality: Bilateral;   SINOSCOPY     TONSILLECTOMY     TUBAL LIGATION     VULVECTOMY N/A 12/24/2021   Procedure: WIDE EXCISION VULVECTOMY;  Surgeon: [name], MD;  Location: [facility];  Service: Gynecology;  Laterality: N/A;   Patient Active Problem List   Diagnosis Date Noted   Melanoma in situ ([place]) 07/17/2022   Hypothyroidism 01/22/2022   Insomnia 01/22/2022   Lipoma 01/22/2022   Major depression, single episode 01/22/2022   Morbid obesity ([place]) 01/22/2022   Overactive bladder 01/22/2022   Type 2 diabetes mellitus without complications ([place]) 73/71/0626   Allergic rhinitis due to pollen 01/22/2022   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[place]) 08/30/2021   BMI 37.0-37.9, adult 08/30/2021   Spondylolisthesis of lumbosacral region 02/14/2019   Mild  persistent asthma without complication 95/62/1308   Mixed rhinitis 06/10/2018   Obstructive sleep apnea 12/11/2016   Palpitations 06/17/2016   Deflected nasal septum 09/13/2015   Hypertrophy of nasal turbinates 09/13/2015   Deviated nasal septum 06/21/2015    Class: Chronic   HYPERLIPIDEMIA 11/18/2007   Essential hypertension 11/18/2007   Allergic rhinitis 11/18/2007   COUGH 11/18/2007    PCP: Saintclair Halsted, FNP   REFERRING PROVIDER: Starling Manns, MD  REFERRING DIAG: M54.16 (ICD-10-CM) - Radiculopathy, lumbar region  Rationale for Evaluation and Treatment: Rehabilitation  THERAPY DIAG:  M54.16 (ICD-10-CM) - Radiculopathy, lumbar region  ONSET DATE:  06/08/2022  SUBJECTIVE:                                                                                                                                                                                           SUBJECTIVE STATEMENT: I overdid it yesterday due to having my grandchildren.  My back is bothering me today.  I had biopsy surgery on Friday so I am sore from that.    PERTINENT HISTORY:  Scheduled for surgery for other gynecological issue on 07/29/22  PAIN:  Are you having pain? Yes: NPRS scale: 4/10 Pain location: low back and right leg Pain description: aching and the leg starts burning Aggravating factors: driving Relieving factors: Tylenol, gabapentin, baclofen  PRECAUTIONS: None  WEIGHT BEARING RESTRICTIONS: No  FALLS:  Has patient fallen in last 6 months? No  LIVING ENVIRONMENT: Lives with: lives with their family Lives in: House/apartment  OCCUPATION: retired Marine scientist   PLOF: Independent, Independent with basic ADLs, Independent with household mobility without device, Independent with community mobility without device, Independent with gait, and Independent with transfers  PATIENT GOALS: To eliminate leg pain and avoid further problems with my back.   NEXT MD VISIT: as needed  OBJECTIVE:   DIAGNOSTIC FINDINGS:  none  PATIENT SURVEYS:  FOTO 56 (goal is 59) 07/13/22: 98 FOTO  SCREENING FOR RED FLAGS: Bowel or bladder incontinence: No Spinal tumors: No Cauda equina syndrome: No Compression fracture: No Abdominal aneurysm: No  COGNITION: Overall cognitive status: Within functional limits for tasks assessed     SENSATION: Light touch: Impaired   MUSCLE LENGTH: Hamstrings: Right 60 deg; Left 60 deg Thomas test: Right pos ; Left pos  POSTURE:  Acquired scoliosis lumbar   LOWER EXTREMITY ROM:    WFL  LOWER EXTREMITY MMT:    Generally 4 to 4+/5 with exception  LUMBAR SPECIAL TESTS:  Straight leg raise test: Negative  FUNCTIONAL TESTS:   Eval: 5 times sit to stand: 14.67 Timed up and go (TUG): 9.52   07/15/22: 5x  sit to stand: 12.2 seconds  GAIT: Distance walked: 50 Assistive device utilized: None Level of assistance: Complete Independence Comments: antalgic   TODAY'S TREATMENT:    07/21/21: Didn't do NuStep due to recent surgery Seated hamstring stretch 2x30 sec Seated piriformis stretch 2x30 sec Low trunk rotation: 3x20 seconds  Supine TA with concurrent Ball squeeze 2x10sec: verbal cues for TA activation Supine TA with concurrent hip abduction with yellow band 2x10sec: verbal cues for TA activation Trigger Point Dry-Needling  Treatment instructions: Expect mild to moderate muscle soreness. S/S of pneumothorax if dry needled over a lung field, and to seek immediate medical attention should they occur. Patient verbalized understanding of these instructions and education.  Patient Consent Given: Yes Education handout provided: Previously provided Muscles treated: bil gluteals, lumbar paraspinals bil. Treatment response/outcome: Utilized skilled palpation to identify trigger points.  During dry needling able to palpate muscle twitch and muscle elongation.  Skilled palpation and monitoring by PT during dry needling    07/15/21: Nustep level 5 x 7 min with PT  present to discuss status Seated hamstring stretch 2x30 sec Seated piriformis stretch 2x30 sec Low trunk rotation: 3x20 seconds  Supine TA with concurrent Ball squeeze 2x10sec: verbal cues for TA activation Supine TA with concurrent hip abduction with yellow band 2x10sec: verbal cues for TA activation Sit to stand: holding 5# KB 2x10 Standing hip abduction 2x10 07/13/21: Nustep level 5 x 7 min with PT  present to discuss status Seated hamstring stretch 2x30 sec Seated piriformis stretch 2x30 sec Supine hip cross over stretch Bil 3x 20 sec Bil Low trunk rotation: 3x20 seconds  Supine TA with concurrent Ball squeeze 2x10sec: verbal cues for TA activation Sit  to stand: holding 5# KB 2x10 Trigger Point Dry-Needling  Treatment instructions: Expect mild to moderate muscle soreness. S/S of pneumothorax if dry needled over a lung field, and to seek immediate medical attention should they occur. Patient verbalized understanding of these instructions and education.  Patient Consent Given: Yes Education handout provided: Previously provided Muscles treated: bil gluteals, lumbar paraspinals bil. Treatment response/outcome: Utilized skilled palpation to identify trigger points.  During dry needling able to palpate muscle twitch and muscle elongation.  Skilled palpation and monitoring by PT during dry needling   07/10/21: Nustep level 5 x 7 min with PTA  present to discuss status Seated hamstring stretch 2x30 sec Seated piriformis stretch 2x30 sec Supine hip cross over stretch Bil 3x 20 sec Bil Low trunk rotation: 3x20 seconds  Supine TA with concurrent Ball squeeze 2x5 sec: initially pt would bulge at abdominals, improved with TC. Sit to stand: holding 5# KB 3x5 Standing hip abduction and extension Bil 2x10 0#. Pt had better hip extension "tapping foot behind her"   PATIENT EDUCATION:  Education details: Initiated HEP Person educated: Patient Education method: Consulting civil engineer, Media planner, Verbal cues, and Handouts Education comprehension: verbalized understanding, returned demonstration, and verbal cues required  HOME EXERCISE PROGRAM: Access Code: [email or web address] URL: [email or web address] Date: 06/10/2022 Prepared by: Candyce Churn  Exercises - Standing Hamstring Stretch on Chair  - 1 x daily - 7 x weekly - 1 sets - 3 reps - 30 hold - Standing Hip Flexor Stretch with Foot Elevated  - 1 x daily - 7 x weekly - 1 sets - 3 reps - 30 hold - Lying Prone  - 1 x daily - 7 x weekly - 1 sets - 1 reps - 2 min hold - Static Prone on Elbows  - 1 x daily - 7 x weekly -  1 sets - 1 reps - 2 min hold - Prone Press Up  - 1 x daily - 7 x weekly - 1 sets  - 10 reps - Seated Piriformis Stretch with Trunk Bend  - 1 x daily - 7 x weekly - 1 sets - 3 reps - 30 hold  ASSESSMENT:  CLINICAL IMPRESSION: Pt arrived with increased stiffness and lumbar pain.  She had surgery for vaginal tissue biopsy 4 days ago and then took care of her young grandchildren yesterday including a lot of walking [facility] the science [facility].  Session focused on gentle mobility exercises and DN and manual therapy to the lumbar spine to reduce pain.  Pt with good twitch response to DN with improved tissue mobility and reduced pain after session.  Patient will benefit from skilled PT to address the below impairments and improve overall function.   OBJECTIVE IMPAIRMENTS: difficulty walking, decreased ROM, decreased strength, increased muscle spasms, impaired flexibility, impaired sensation, postural dysfunction, and pain.   ACTIVITY LIMITATIONS: carrying, lifting, bending, sitting, standing, squatting, sleeping, transfers, bathing, and dressing  PARTICIPATION LIMITATIONS: meal prep, cleaning, laundry, shopping, and community activity  PERSONAL FACTORS: Fitness and 1 comorbidity: pending gynecological surgery scheduled in January  are also affecting patient's functional outcome.   REHAB POTENTIAL: Good  CLINICAL DECISION MAKING: Stable/uncomplicated  EVALUATION COMPLEXITY: Low   GOALS: Goals reviewed with patient? Yes  SHORT TERM GOALS: Target date: 07/07/2022    Pain report to be no greater than 4/10  Baseline: Goal status: IN PROGRESS  2.  Patient will be independent with initial HEP  Baseline:  Goal status: MET  3.  Patient to report 50% improvement in overall symptoms Baseline: 70% (07/08/22) Goal status: MET   LONG TERM GOALS: Target date: 08/04/2022    Patient to be independent with advanced HEP  Baseline:  Goal status: In progress   2.  Patient to report pain no greater than 2/10  Baseline: 7/10 when very active (07/13/22) Goal status: in progress   3.   Patient to report 85% improvement in overall symptoms  Baseline: 60-70% (07/13/22) Goal status: in progress   4.  FOTO score to be 63 Baseline: 59 (07/13/22) Goal status: In progress   5.  5 times sit to stand and TUG to improve by 3-5 sec Baseline: 5x sit to stand 12.2 seconds, improved by 2 seconds (07/15/22) Goal status: In progress   6.  Radicular symptoms to be centralized Baseline: mostly back, mild into leg that is intermittent (07/08/22) Goal status: In progress   PLAN:  PT FREQUENCY: 1-2x/week  PT DURATION: 8 weeks  PLANNED INTERVENTIONS: Therapeutic exercises, Therapeutic activity, Neuromuscular re-education, Balance training, Gait training, Patient/Family education, Self Care, Joint mobilization, Stair training, Aquatic Therapy, Dry Needling, Electrical stimulation, Spinal mobilization, Cryotherapy, Moist heat, Taping, Traction, Ultrasound, Ionotophoresis '4mg'$ /ml Dexamethasone, Manual therapy, and Re-evaluation.  PLAN FOR NEXT SESSION:  core strength, extension based exercise, assess response to DN, resume weights and NuStep as able.    Sigurd Sos, PT 07/21/22 11:46 AM    [facility] [address], [place], [place] [address] Phone # [phone] Fax [phone]

## 2022-07-23 ENCOUNTER — Ambulatory Visit: Payer: PPO | Admitting: Gynecologic Oncology

## 2022-07-23 ENCOUNTER — Ambulatory Visit: Payer: PPO

## 2022-07-23 DIAGNOSIS — R262 Difficulty in walking, not elsewhere classified: Secondary | ICD-10-CM

## 2022-07-23 DIAGNOSIS — M5416 Radiculopathy, lumbar region: Secondary | ICD-10-CM | POA: Diagnosis not present

## 2022-07-23 DIAGNOSIS — M6281 Muscle weakness (generalized): Secondary | ICD-10-CM

## 2022-07-23 LAB — SURGICAL PATHOLOGY

## 2022-07-23 NOTE — Therapy (Signed)
OUTPATIENT PHYSICAL THERAPY TREATMENT NOTE   Patient Name: [name] MRN: [id number] DOB:1950/10/13, 72 y.o., female Today's Date: 07/23/2022  END OF SESSION:  PT End of Session - 07/23/22 1138     Visit Number 13    Date for PT Re-Evaluation 08/04/22    Authorization Type [employer]    Progress Note Due on Visit 20    PT Start Time 1100    PT Stop Time 1138    PT Time Calculation (min) 38 min    Activity Tolerance Patient tolerated treatment well    Behavior During Therapy WFL for tasks assessed/performed                     Past Medical History:  Diagnosis Date   Asthma    has rescue inhaler, now on Flovent   Chronic back pain    Diabetes mellitus without complication ([place])    on medformin, on Ozempic   GERD (gastroesophageal reflux disease)    History of COVID-19 04/05/2021   Hyperlipemia    Hypertension    Hypothyroidism    Insomnia    OAB (overactive bladder)    Seasonal allergies    Sleep apnea    uses a cpap   Past Surgical History:  Procedure Laterality Date   ANKLE ARTHROTOMY  2003   left-fx   APPENDECTOMY     back fusion  02/14/2019   L4 back fusion   BACK SURGERY  2010   lumb disk   BREAST LUMPECTOMY WITH RADIOACTIVE SEED LOCALIZATION Left 06/13/2019   Procedure: LEFT BREAST LUMPECTOMY WITH RADIOACTIVE SEED LOCALIZATION;  Surgeon: Erroll Luna, MD;  Location: [place];  Service: General;  Laterality: Left;   CARPAL TUNNEL RELEASE     rt   CARPAL TUNNEL RELEASE Left 08/01/2013   Procedure: LEFT CARPAL TUNNEL RELEASE;  Surgeon: Wynonia Sours, MD;  Location: [place];  Service: Orthopedics;  Laterality: Left;   CESAREAN SECTION  83,86   COLONOSCOPY     DILATATION & CURETTAGE/HYSTEROSCOPY WITH MYOSURE N/A 08/23/2018   Procedure: DILATATION & CURETTAGE/HYSTEROSCOPY WITH MYOSURE POLYPECTOMY;  Surgeon: Nunzio Cobbs, MD;  Location: [facility];  Service: Gynecology;   Laterality: N/A;  fibroid resection   DILATION AND CURETTAGE OF UTERUS     x2   MASS EXCISION N/A 08/04/2017   Procedure: EXCISION LIPOMA UPPER BACK x2 AND NECK;  Surgeon: Erroll Luna, MD;  Location: [place];  Service: General;  Laterality: N/A;   NASAL SEPTOPLASTY W/ TURBINOPLASTY Bilateral 06/21/2015   Procedure: NASAL SEPTOPLASTY WITH BILATERAL TURBINATE REDUCTION;  Surgeon: Jerrell Belfast, MD;  Location: [place];  Service: ENT;  Laterality: Bilateral;   SINOSCOPY     TONSILLECTOMY     TUBAL LIGATION     VULVECTOMY N/A 12/24/2021   Procedure: WIDE EXCISION VULVECTOMY;  Surgeon: [name], MD;  Location: [facility];  Service: Gynecology;  Laterality: N/A;   Patient Active Problem List   Diagnosis Date Noted   Melanoma in situ ([place]) 07/17/2022   Hypothyroidism 01/22/2022   Insomnia 01/22/2022   Lipoma 01/22/2022   Major depression, single episode 01/22/2022   Morbid obesity ([place]) 01/22/2022   Overactive bladder 01/22/2022   Type 2 diabetes mellitus without complications ([place]) 05/02/2535   Allergic rhinitis due to pollen 01/22/2022   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[place]) 08/30/2021   BMI 37.0-37.9, adult 08/30/2021   Spondylolisthesis of lumbosacral region 02/14/2019  Mild persistent asthma without complication 51/08/5850   Mixed rhinitis 06/10/2018   Obstructive sleep apnea 12/11/2016   Palpitations 06/17/2016   Deflected nasal septum 09/13/2015   Hypertrophy of nasal turbinates 09/13/2015   Deviated nasal septum 06/21/2015    Class: Chronic   HYPERLIPIDEMIA 11/18/2007   Essential hypertension 11/18/2007   Allergic rhinitis 11/18/2007   COUGH 11/18/2007    PCP: Saintclair Halsted, FNP   REFERRING PROVIDER: Starling Manns, MD  REFERRING DIAG: M54.16 (ICD-10-CM) - Radiculopathy, lumbar region  Rationale for Evaluation and Treatment: Rehabilitation  THERAPY DIAG:  M54.16 (ICD-10-CM) - Radiculopathy, lumbar region  ONSET DATE:  06/08/2022  SUBJECTIVE:                                                                                                                                                                                           SUBJECTIVE STATEMENT: I had the grand kids yesterday and I worked to limit lifting them.  I am still recovering from surgery.   PERTINENT HISTORY:  Scheduled for surgery for other gynecological issue on 07/29/22  PAIN:  Are you having pain? Yes: NPRS scale: 2/10 Pain location: low back and right leg Pain description: aching and the leg starts burning Aggravating factors: driving Relieving factors: Tylenol, gabapentin, baclofen  PRECAUTIONS: None  WEIGHT BEARING RESTRICTIONS: No  FALLS:  Has patient fallen in last 6 months? No  LIVING ENVIRONMENT: Lives with: lives with their family Lives in: House/apartment  OCCUPATION: retired Marine scientist   PLOF: Independent, Independent with basic ADLs, Independent with household mobility without device, Independent with community mobility without device, Independent with gait, and Independent with transfers  PATIENT GOALS: To eliminate leg pain and avoid further problems with my back.   NEXT MD VISIT: as needed  OBJECTIVE:   DIAGNOSTIC FINDINGS:  none  PATIENT SURVEYS:  FOTO 56 (goal is 83) 07/13/22: 68 FOTO  SCREENING FOR RED FLAGS: Bowel or bladder incontinence: No Spinal tumors: No Cauda equina syndrome: No Compression fracture: No Abdominal aneurysm: No  COGNITION: Overall cognitive status: Within functional limits for tasks assessed     SENSATION: Light touch: Impaired   MUSCLE LENGTH: Hamstrings: Right 60 deg; Left 60 deg Thomas test: Right pos ; Left pos  POSTURE:  Acquired scoliosis lumbar   LOWER EXTREMITY ROM:    WFL  LOWER EXTREMITY MMT:    Generally 4 to 4+/5 with exception  LUMBAR SPECIAL TESTS:  Straight leg raise test: Negative  FUNCTIONAL TESTS:  Eval: 5 times sit to stand: 14.67 Timed up  and go (TUG): 9.52   07/15/22: 5x sit to stand: 12.2 seconds  GAIT: Distance walked:  50 Assistive device utilized: None Level of assistance: Complete Independence Comments: antalgic   TODAY'S TREATMENT:    07/23/21: Nustep level 5 x 7 min with PT  present to discuss status Seated hamstring stretch 2x30 sec Seated piriformis stretch 2x30 sec Low trunk rotation: 3x20 seconds  Supine TA with concurrent Ball squeeze 2x10sec: verbal cues for TA activation Supine TA with concurrent hip abduction with yellow band 2x10sec: verbal cues for TA activation Sit to stand: holding 5# KB 2x10 Manual: addaday to Rt low back and gluteals.  07/21/21: Didn't do NuStep due to recent surgery Seated hamstring stretch 2x30 sec Seated piriformis stretch 2x30 sec Low trunk rotation: 3x20 seconds  Supine TA with concurrent Ball squeeze 2x10sec: verbal cues for TA activation Supine TA with concurrent hip abduction with yellow band 2x10sec: verbal cues for TA activation Trigger Point Dry-Needling  Treatment instructions: Expect mild to moderate muscle soreness. S/S of pneumothorax if dry needled over a lung field, and to seek immediate medical attention should they occur. Patient verbalized understanding of these instructions and education.  Patient Consent Given: Yes Education handout provided: Previously provided Muscles treated: bil gluteals, lumbar paraspinals bil. Treatment response/outcome: Utilized skilled palpation to identify trigger points.  During dry needling able to palpate muscle twitch and muscle elongation.  Skilled palpation and monitoring by PT during dry needling    07/15/21: Nustep level 5 x 7 min with PT  present to discuss status Seated hamstring stretch 2x30 sec Seated piriformis stretch 2x30 sec Low trunk rotation: 3x20 seconds  Supine TA with concurrent Ball squeeze 2x10sec: verbal cues for TA activation Supine TA with concurrent hip abduction with yellow band 2x10sec: verbal cues  for TA activation Sit to stand: holding 5# KB 2x10 Standing hip abduction 2x10  PATIENT EDUCATION:  Education details: Initiated HEP Person educated: Patient Education method: Consulting civil engineer, Media planner, Verbal cues, and Handouts Education comprehension: verbalized understanding, returned demonstration, and verbal cues required  HOME EXERCISE PROGRAM: Access Code: [email or web address] URL: [email or web address] Date: 06/10/2022 Prepared by: Candyce Churn  Exercises - Standing Hamstring Stretch on Chair  - 1 x daily - 7 x weekly - 1 sets - 3 reps - 30 hold - Standing Hip Flexor Stretch with Foot Elevated  - 1 x daily - 7 x weekly - 1 sets - 3 reps - 30 hold - Lying Prone  - 1 x daily - 7 x weekly - 1 sets - 1 reps - 2 min hold - Static Prone on Elbows  - 1 x daily - 7 x weekly - 1 sets - 1 reps - 2 min hold - Prone Press Up  - 1 x daily - 7 x weekly - 1 sets - 10 reps - Seated Piriformis Stretch with Trunk Bend  - 1 x daily - 7 x weekly - 1 sets - 3 reps - 30 hold  ASSESSMENT:  CLINICAL IMPRESSION: Pt was able to resume regular exercise in the clinic today. Pt is working to reduce lumbar strain by limiting lifting and carrying heavy objects.  She does experience more pain when more active. PT monitored pt throughout session for cueing.  Patient will benefit from skilled PT to address the below impairments and improve overall function.   OBJECTIVE IMPAIRMENTS: difficulty walking, decreased ROM, decreased strength, increased muscle spasms, impaired flexibility, impaired sensation, postural dysfunction, and pain.   ACTIVITY LIMITATIONS: carrying, lifting, bending, sitting, standing, squatting, sleeping, transfers, bathing, and dressing  PARTICIPATION LIMITATIONS: meal prep, cleaning, laundry, shopping, and community activity  PERSONAL FACTORS:  Fitness and 1 comorbidity: pending gynecological surgery scheduled in January  are also affecting patient's functional outcome.   REHAB  POTENTIAL: Good  CLINICAL DECISION MAKING: Stable/uncomplicated  EVALUATION COMPLEXITY: Low   GOALS: Goals reviewed with patient? Yes  SHORT TERM GOALS: Target date: 07/07/2022    Pain report to be no greater than 4/10  Baseline: Goal status: IN PROGRESS  2.  Patient will be independent with initial HEP  Baseline:  Goal status: MET  3.  Patient to report 50% improvement in overall symptoms Baseline: 70% (07/08/22) Goal status: MET   LONG TERM GOALS: Target date: 08/04/2022    Patient to be independent with advanced HEP  Baseline:  Goal status: In progress   2.  Patient to report pain no greater than 2/10  Baseline: 7/10 when very active (07/13/22) Goal status: in progress   3.  Patient to report 85% improvement in overall symptoms  Baseline: 60-70% (07/13/22) Goal status: in progress   4.  FOTO score to be 63 Baseline: 59 (07/13/22) Goal status: In progress   5.  5 times sit to stand and TUG to improve by 3-5 sec Baseline: 5x sit to stand 12.2 seconds, improved by 2 seconds (07/15/22) Goal status: In progress   6.  Radicular symptoms to be centralized Baseline: mostly back, mild into leg that is intermittent (07/08/22) Goal status: In progress   PLAN:  PT FREQUENCY: 1-2x/week  PT DURATION: 8 weeks  PLANNED INTERVENTIONS: Therapeutic exercises, Therapeutic activity, Neuromuscular re-education, Balance training, Gait training, Patient/Family education, Self Care, Joint mobilization, Stair training, Aquatic Therapy, Dry Needling, Electrical stimulation, Spinal mobilization, Cryotherapy, Moist heat, Taping, Traction, Ultrasound, Ionotophoresis '4mg'$ /ml Dexamethasone, Manual therapy, and Re-evaluation.  PLAN FOR NEXT SESSION:  core strength, extension based exercise, assess response to DN   Sigurd Sos, PT 07/23/22 11:39 AM    [facility] [address], [place], [place] [address] Phone # [phone] Fax [phone]

## 2022-07-24 ENCOUNTER — Telehealth: Payer: Self-pay | Admitting: Gynecologic Oncology

## 2022-07-24 DIAGNOSIS — C519 Malignant neoplasm of vulva, unspecified: Secondary | ICD-10-CM

## 2022-07-24 NOTE — Telephone Encounter (Signed)
Called patient after speaking with pathologist.  Biopsies from procedure last week showed no melanoma or melanoma in situ of vulvar biopsies.  2 of the biopsies taken had slight increase in melanocytes which may be solely related to recent administration of Aldara and inflammation.  Given small amount of tissue sampling, it can be hard to tell what is normal.  To help establish if this is patient's new baseline, pathologist recommended punch biopsies in these areas in 3 months.  Will plan to schedule patient for return visit to see me in 3 months.  She is due for next full-body CT imaging in mid February.  Will plan to cancel pelvic CT next week and get CT chest/abdomen/pelvis scheduled for mid February.  Jeral Pinch MD Gynecologic Oncology

## 2022-07-25 ENCOUNTER — Other Ambulatory Visit (HOSPITAL_BASED_OUTPATIENT_CLINIC_OR_DEPARTMENT_OTHER): Payer: Self-pay

## 2022-07-27 ENCOUNTER — Telehealth: Payer: Self-pay | Admitting: *Deleted

## 2022-07-27 ENCOUNTER — Encounter (HOSPITAL_COMMUNITY): Payer: Self-pay

## 2022-07-27 ENCOUNTER — Ambulatory Visit (HOSPITAL_COMMUNITY): Payer: PPO

## 2022-07-27 NOTE — Telephone Encounter (Signed)
Per Dr Berline Lopes scheduled the patient for a follow up appt on 4/19

## 2022-07-28 ENCOUNTER — Ambulatory Visit: Payer: PPO

## 2022-07-28 DIAGNOSIS — M5416 Radiculopathy, lumbar region: Secondary | ICD-10-CM

## 2022-07-28 DIAGNOSIS — M6281 Muscle weakness (generalized): Secondary | ICD-10-CM

## 2022-07-28 DIAGNOSIS — R262 Difficulty in walking, not elsewhere classified: Secondary | ICD-10-CM

## 2022-07-28 DIAGNOSIS — R252 Cramp and spasm: Secondary | ICD-10-CM

## 2022-07-28 NOTE — Therapy (Addendum)
OUTPATIENT PHYSICAL THERAPY TREATMENT NOTE   Patient Name: [name] MRN: [id number] DOB:[birth year], 72 y.o., female Today's Date: 07/28/2022  END OF SESSION:  PT End of Session - 07/28/22 1141     Visit Number 14    Date for PT Re-Evaluation 08/04/22    Authorization Type [employer]    Progress Note Due on Visit 20    PT Start Time 1104    PT Stop Time 1139    PT Time Calculation (min) 35 min    Activity Tolerance Patient tolerated treatment well    Behavior During Therapy WFL for tasks assessed/performed                      Past Medical History:  Diagnosis Date   Asthma    has rescue inhaler, now on Flovent   Chronic back pain    Diabetes mellitus without complication ([place])    on medformin, on Ozempic   GERD (gastroesophageal reflux disease)    History of COVID-19 04/05/2021   Hyperlipemia    Hypertension    Hypothyroidism    Insomnia    OAB (overactive bladder)    Seasonal allergies    Sleep apnea    uses a cpap   Past Surgical History:  Procedure Laterality Date   ANKLE ARTHROTOMY  2003   left-fx   APPENDECTOMY     back fusion  02/14/2019   L4 back fusion   BACK SURGERY  2010   lumb disk   BREAST LUMPECTOMY WITH RADIOACTIVE SEED LOCALIZATION Left 06/13/2019   Procedure: LEFT BREAST LUMPECTOMY WITH RADIOACTIVE SEED LOCALIZATION;  Surgeon: Erroll Luna, MD;  Location: [place];  Service: General;  Laterality: Left;   CARPAL TUNNEL RELEASE     rt   CARPAL TUNNEL RELEASE Left 08/01/2013   Procedure: LEFT CARPAL TUNNEL RELEASE;  Surgeon: Wynonia Sours, MD;  Location: [place];  Service: Orthopedics;  Laterality: Left;   CESAREAN SECTION  83,86   COLONOSCOPY     DILATATION & CURETTAGE/HYSTEROSCOPY WITH MYOSURE N/A 08/23/2018   Procedure: DILATATION & CURETTAGE/HYSTEROSCOPY WITH MYOSURE POLYPECTOMY;  Surgeon: Nunzio Cobbs, MD;  Location: [facility];  Service: Gynecology;   Laterality: N/A;  fibroid resection   DILATION AND CURETTAGE OF UTERUS     x2   MASS EXCISION N/A 08/04/2017   Procedure: EXCISION LIPOMA UPPER BACK x2 AND NECK;  Surgeon: Erroll Luna, MD;  Location: [place];  Service: General;  Laterality: N/A;   NASAL SEPTOPLASTY W/ TURBINOPLASTY Bilateral 06/21/2015   Procedure: NASAL SEPTOPLASTY WITH BILATERAL TURBINATE REDUCTION;  Surgeon: Jerrell Belfast, MD;  Location: [place];  Service: ENT;  Laterality: Bilateral;   SINOSCOPY     TONSILLECTOMY     TUBAL LIGATION     VULVECTOMY N/A 12/24/2021   Procedure: WIDE EXCISION VULVECTOMY;  Surgeon: [name], MD;  Location: [facility];  Service: Gynecology;  Laterality: N/A;   Patient Active Problem List   Diagnosis Date Noted   Melanoma in situ ([place]) 07/17/2022   Hypothyroidism 01/22/2022   Insomnia 01/22/2022   Lipoma 01/22/2022   Major depression, single episode 01/22/2022   Morbid obesity ([place]) 01/22/2022   Overactive bladder 01/22/2022   Type 2 diabetes mellitus without complications ([place]) 63/84/5364   Allergic rhinitis due to pollen 01/22/2022   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[place]) 08/30/2021   BMI 37.0-37.9, adult 08/30/2021   Spondylolisthesis of lumbosacral region 02/14/2019  Mild persistent asthma without complication 65/09/5463   Mixed rhinitis 06/10/2018   Obstructive sleep apnea 12/11/2016   Palpitations 06/17/2016   Deflected nasal septum 09/13/2015   Hypertrophy of nasal turbinates 09/13/2015   Deviated nasal septum 06/21/2015    Class: Chronic   HYPERLIPIDEMIA 11/18/2007   Essential hypertension 11/18/2007   Allergic rhinitis 11/18/2007   COUGH 11/18/2007    PCP: Saintclair Halsted, FNP   REFERRING PROVIDER: Starling Manns, MD  REFERRING DIAG: M54.16 (ICD-10-CM) - Radiculopathy, lumbar region  Rationale for Evaluation and Treatment: Rehabilitation  THERAPY DIAG:  M54.16 (ICD-10-CM) - Radiculopathy, lumbar region  ONSET DATE:  06/08/2022  SUBJECTIVE:                                                                                                                                                                                           SUBJECTIVE STATEMENT:  I haven't been as good with my exercises due to still healing from surgery.  My pathology report was clear.  My low back is feeling pretty good.    PERTINENT HISTORY:  Scheduled for surgery for other gynecological issue on 07/29/22  PAIN:  Are you having pain? Yes: NPRS scale: 0/10 Pain location: low back and right leg Pain description: aching and the leg starts burning Aggravating factors: driving Relieving factors: Tylenol, gabapentin, baclofen  PRECAUTIONS: None  WEIGHT BEARING RESTRICTIONS: No  FALLS:  Has patient fallen in last 6 months? No  LIVING ENVIRONMENT: Lives with: lives with their family Lives in: House/apartment  OCCUPATION: retired Marine scientist   PLOF: Independent, Independent with basic ADLs, Independent with household mobility without device, Independent with community mobility without device, Independent with gait, and Independent with transfers  PATIENT GOALS: To eliminate leg pain and avoid further problems with my back.   NEXT MD VISIT: as needed  OBJECTIVE:   DIAGNOSTIC FINDINGS:  none  PATIENT SURVEYS:  FOTO 56 (goal is 41) 07/13/22: 58 FOTO  SCREENING FOR RED FLAGS: Bowel or bladder incontinence: No Spinal tumors: No Cauda equina syndrome: No Compression fracture: No Abdominal aneurysm: No  COGNITION: Overall cognitive status: Within functional limits for tasks assessed     SENSATION: Light touch: Impaired   MUSCLE LENGTH: Hamstrings: Right 60 deg; Left 60 deg Thomas test: Right pos ; Left pos  POSTURE:  Acquired scoliosis lumbar   LOWER EXTREMITY ROM:    WFL  LOWER EXTREMITY MMT:    Generally 4 to 4+/5 with exception  LUMBAR SPECIAL TESTS:  Straight leg raise test: Negative  FUNCTIONAL TESTS:   Eval: 5 times sit to stand: 14.67 Timed up and go (TUG): 9.52   07/15/22:  5x sit to stand: 12.2 seconds  GAIT: Distance walked: 50 Assistive device utilized: None Level of assistance: Complete Independence Comments: antalgic   TODAY'S TREATMENT:     07/28/21: Nustep level 5 x 7 min with PT  present to discuss status Seated hamstring stretch 2x30 sec Seated piriformis stretch 2x30 sec Low trunk rotation: 3x20 seconds  Supine TA with concurrent Ball squeeze 2x10sec: verbal cues for TA activation Supine TA with concurrent hip abduction with red band 2x10sec: verbal cues for TA activation Sit to stand: holding 5# KB 2x10 Manual: addaday to Rt low back and gluteals.   07/23/21: Nustep level 5 x 7 min with PT  present to discuss status Seated hamstring stretch 2x30 sec Seated piriformis stretch 2x30 sec Low trunk rotation: 3x20 seconds  Supine TA with concurrent Ball squeeze 2x10sec: verbal cues for TA activation Supine TA with concurrent hip abduction with yellow band 2x10sec: verbal cues for TA activation Sit to stand: holding 5# KB 2x10 Manual: addaday to Rt low back and gluteals.   07/21/21: Didn't do NuStep due to recent surgery Seated hamstring stretch 2x30 sec Seated piriformis stretch 2x30 sec Low trunk rotation: 3x20 seconds  Supine TA with concurrent Ball squeeze 2x10sec: verbal cues for TA activation Supine TA with concurrent hip abduction with yellow band 2x10sec: verbal cues for TA activation Trigger Point Dry-Needling  Treatment instructions: Expect mild to moderate muscle soreness. S/S of pneumothorax if dry needled over a lung field, and to seek immediate medical attention should they occur. Patient verbalized understanding of these instructions and education.  Patient Consent Given: Yes Education handout provided: Previously provided Muscles treated: bil gluteals, lumbar paraspinals bil. Treatment response/outcome: Utilized skilled palpation to identify trigger  points.  During dry needling able to palpate muscle twitch and muscle elongation.  Skilled palpation and monitoring by PT during dry needling   PATIENT EDUCATION:  Education details: Initiated HEP Person educated: Patient Education method: Consulting civil engineer, Media planner, Verbal cues, and Handouts Education comprehension: verbalized understanding, returned demonstration, and verbal cues required  HOME EXERCISE PROGRAM: Access Code: [email or web address] URL: [email or web address] Date: 06/10/2022 Prepared by: Candyce Churn  Exercises - Standing Hamstring Stretch on Chair  - 1 x daily - 7 x weekly - 1 sets - 3 reps - 30 hold - Standing Hip Flexor Stretch with Foot Elevated  - 1 x daily - 7 x weekly - 1 sets - 3 reps - 30 hold - Lying Prone  - 1 x daily - 7 x weekly - 1 sets - 1 reps - 2 min hold - Static Prone on Elbows  - 1 x daily - 7 x weekly - 1 sets - 1 reps - 2 min hold - Prone Press Up  - 1 x daily - 7 x weekly - 1 sets - 10 reps - Seated Piriformis Stretch with Trunk Bend  - 1 x daily - 7 x weekly - 1 sets - 3 reps - 30 hold  ASSESSMENT:  CLINICAL IMPRESSION: Pt reports 70% overall reduction in pain since the start of care.  Pain is variable and based on activity. Pt is working to reduce lumbar strain by limiting lifting and carrying heavy objects.  Pt has not been as active with exercises due to healing from surgery.  PT monitored pt throughout session for cueing.  Patient will benefit from skilled PT to address the below impairments and improve overall function.   OBJECTIVE IMPAIRMENTS: difficulty walking, decreased ROM, decreased strength, increased muscle spasms, impaired flexibility, impaired sensation, postural dysfunction,  and pain.   ACTIVITY LIMITATIONS: carrying, lifting, bending, sitting, standing, squatting, sleeping, transfers, bathing, and dressing  PARTICIPATION LIMITATIONS: meal prep, cleaning, laundry, shopping, and community activity  PERSONAL FACTORS:  Fitness and 1 comorbidity: pending gynecological surgery scheduled in January  are also affecting patient's functional outcome.   REHAB POTENTIAL: Good  CLINICAL DECISION MAKING: Stable/uncomplicated  EVALUATION COMPLEXITY: Low   GOALS: Goals reviewed with patient? Yes  SHORT TERM GOALS: Target date: 07/07/2022    Pain report to be no greater than 4/10  Baseline: Goal status: IN PROGRESS  2.  Patient will be independent with initial HEP  Baseline:  Goal status: MET  3.  Patient to report 50% improvement in overall symptoms Baseline: 70% (07/08/22) Goal status: MET   LONG TERM GOALS: Target date: 08/04/2022    Patient to be independent with advanced HEP  Baseline:  Goal status: In progress   2.  Patient to report pain no greater than 2/10  Baseline: 7/10 when very active (07/13/22) Goal status: in progress   3.  Patient to report 85% improvement in overall symptoms  Baseline: 60-70% (07/13/22) Goal status: in progress   4.  FOTO score to be 63 Baseline: 59 (07/13/22) Goal status: In progress   5.  5 times sit to stand and TUG to improve by 3-5 sec Baseline: 5x sit to stand 12.2 seconds, improved by 2 seconds (07/15/22) Goal status: In progress   6.  Radicular symptoms to be centralized Baseline: mostly back, intermittent in leg (07/28/22) Goal status: In progress   PLAN:  PT FREQUENCY: 1-2x/week  PT DURATION: 8 weeks  PLANNED INTERVENTIONS: Therapeutic exercises, Therapeutic activity, Neuromuscular re-education, Balance training, Gait training, Patient/Family education, Self Care, Joint mobilization, Stair training, Aquatic Therapy, Dry Needling, Electrical stimulation, Spinal mobilization, Cryotherapy, Moist heat, Taping, Traction, Ultrasound, Ionotophoresis '4mg'$ /ml Dexamethasone, Manual therapy, and Re-evaluation.  PLAN FOR NEXT SESSION:  core strength, flexibility, manual as needed.  ERO next week.  Probable reduce to 1x/wk   Sigurd Sos, PT 07/28/22 11:43 AM     [facility] [address], [place], [place] [address] Phone # [phone] Fax [phone]

## 2022-07-29 ENCOUNTER — Encounter: Payer: Self-pay | Admitting: Allergy

## 2022-07-29 ENCOUNTER — Other Ambulatory Visit: Payer: Self-pay

## 2022-07-29 ENCOUNTER — Ambulatory Visit: Payer: PPO | Admitting: Allergy

## 2022-07-29 VITALS — BP 124/72 | HR 60 | Temp 98.0°F | Resp 18

## 2022-07-29 DIAGNOSIS — J4530 Mild persistent asthma, uncomplicated: Secondary | ICD-10-CM

## 2022-07-29 DIAGNOSIS — J310 Chronic rhinitis: Secondary | ICD-10-CM | POA: Diagnosis not present

## 2022-07-29 MED ORDER — LEVOCETIRIZINE DIHYDROCHLORIDE 5 MG PO TABS: ORAL_TABLET | ORAL | 5 refills | Status: DC

## 2022-07-29 MED ORDER — MONTELUKAST SODIUM 10 MG PO TABS: ORAL_TABLET | ORAL | 5 refills | Status: DC

## 2022-07-29 MED ORDER — ALBUTEROL SULFATE HFA 108 (90 BASE) MCG/ACT IN AERS: 2.0000 | INHALATION_SPRAY | RESPIRATORY_TRACT | 1 refills | Status: DC | PRN

## 2022-07-29 MED ORDER — AZELASTINE HCL 0.1 % NA SOLN: 2.0000 | Freq: Two times a day (BID) | NASAL | 5 refills | Status: DC

## 2022-07-29 NOTE — Patient Instructions (Addendum)
Asthma  - continue Flovent 44 g  1 puff once a day at this time.  Once pollen season is in full swing if not having any increased asthma symptoms and meeting control goals will consider trial off Flovent  - asthma action plan if having asthma flare: take Flovent 2 puffs twice a day   - Continue Singulair 10 mg daily  - have access to albuterol inhaler 2 puffs every 4-6 hours as needed for cough/wheeze/shortness of breath/chest tightness.  May use 15-20 minutes prior to activity.   Monitor frequency of use.    Asthma control goals:  Full participation in all desired activities (may need albuterol before activity) Albuterol use two time or less a week on average (not counting use with activity) Cough interfering with sleep two time or less a month Oral steroids no more than once a year No hospitalizations    Mixed rhinitis  - Continue Singulair as above  - Continue Xyzal every other day at this time.  If not noting increase in allergy symptoms come April in full blown pollen season then can continue weaning xyzal to as needed use.    - Continue Flonase 1-2 sprays each nostril daily as needed for congestion.  Use for 1-2 weeks at a time before stopping once symptoms improved.   - Continue Astelin 1-2 sprays 1-2 times a day as needed for nasal drainage/post-nasal drip   Follow-up in 6 months or sooner if needed

## 2022-07-29 NOTE — Progress Notes (Signed)
Follow-up Note  RE: [name] MRN: [id number] DOB: 1951-04-05 Date of Office Visit: 07/29/2022   History of present illness: [name] is a 72 y.o. female presenting today for follow-up of asthma and mixed rhinitis.  She was last seen in the office on 01/22/22 by myself.   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  After her last visit she had full CT in August that showed the b/l pulmonary nodules were stable with 1 in LUL with a mild increase in size. CT from Nov showed stable pulm nodules. This is being followed closely and has planned repeat full CT in February. She had a biopsy of lt inguinal lymph node that was benign She is planning to get back into walking once she is better healed.   During the fall she states she did have more allergy symptoms with more nasal symptoms.  She has been using xyzal every other day.  She states she did need to adjust to the every other day dosing of xyzal when she changed form daily dosing.  She takes flonase and asteline daily.  She states her asthma control has been doing well.  She continues on Flovent 1 puff daily at this time and singulair.  She believe she may need to use albuterol more once she starts walking again for exercise.  She has not had any UC/ED visits or systemic steroid needs since last visit.   Review of systems in the past 4 weeks: Review of Systems  Constitutional: Negative.   HENT: Negative.    Eyes: Negative.   Respiratory: Negative.    Cardiovascular: Negative.   Gastrointestinal: Negative.   Musculoskeletal: Negative.   Skin: Negative.   Allergic/Immunologic: Negative.   Neurological: Negative.      All other systems negative unless noted above in HPI  Past medical/social/surgical/family history have been reviewed and are unchanged unless specifically indicated below.  No changes  Medication List: Current Outpatient  Medications  Medication Sig Dispense Refill   5-Hydroxytryptophan (5-HTP PO) Take 1 tablet by mouth daily.     acetaminophen (TYLENOL) 500 MG tablet Take 500 mg by mouth 2 (two) times daily.     albuterol (VENTOLIN HFA) 108 (90 Base) MCG/ACT inhaler Inhale 2 puffs into the lungs every 4 (four) hours as needed for wheezing or shortness of breath.     albuterol (VENTOLIN HFA) 108 (90 Base) MCG/ACT inhaler Inhale 2 puffs into the lungs every 4 (four) hours as needed for wheezing or shortness of breath. 18 g 1   amLODipine (NORVASC) 5 MG tablet Take 5 mg by mouth daily.     baclofen (LIORESAL) 20 MG tablet Take 20 mg by mouth See admin instructions. Take 20 mg daily, may take up to 3 times daily as needed for spasms     CINNAMON PO Take 1,000 mg by mouth in the morning and at bedtime.     diclofenac Sodium (VOLTAREN) 1 % GEL Apply 2 g topically 4 (four) times daily as needed (pain).     famotidine-calcium carbonate-magnesium hydroxide (PEPCID COMPLETE) 10-800-165 MG chewable tablet Chew 1 tablet by mouth every evening.     fluticasone (FLONASE) 50 MCG/ACT nasal spray Place 1 spray into both nostrils daily.     fluticasone (FLOVENT HFA) 44 MCG/ACT inhaler TAKE 1 PUFF BY MOUTH EVERY DAY 10.6 each 5   gabapentin (NEURONTIN) 400 MG capsule Take 400-800 mg  by mouth See admin instructions. Take 400 mg in the morning and 800 mg at night     GLUCOSAMINE-CHONDROITIN PO Take 2 tablets by mouth daily.     glucose blood test strip as directed finger stick once a day for 90 days     hydrochlorothiazide (MICROZIDE) 12.5 MG capsule Take 12.5 mg by mouth daily.     levothyroxine (SYNTHROID) 112 MCG tablet Take 112 mcg by mouth daily before breakfast.     losartan (COZAAR) 100 MG tablet Take 100 mg by mouth daily.     Melatonin 3 MG TABS Take 3 mg by mouth at bedtime.     meloxicam (MOBIC) 7.5 MG tablet Take 7.5 mg by mouth 2 (two) times daily as needed for pain.     metFORMIN (GLUCOPHAGE-XR) 500 MG 24 hr tablet  Take 500 mg by mouth daily.     Multiple Vitamin (MULTIVITAMIN WITH MINERALS) TABS tablet Take 1 tablet by mouth at bedtime.     MYRBETRIQ 50 MG TB24 tablet Take 50 mg by mouth daily.     Semaglutide, 2 MG/DOSE, (OZEMPIC, 2 MG/DOSE,) 8 MG/3ML SOPN Inject 2 mg into the skin every Wednesday.     Semaglutide, 2 MG/DOSE, (OZEMPIC, 2 MG/DOSE,) 8 MG/3ML SOPN Inject 2 mg into the skin once a week. 9 mL 3   sertraline (ZOLOFT) 50 MG tablet Take 50 mg by mouth daily.     simvastatin (ZOCOR) 20 MG tablet Take 20 mg by mouth at bedtime.      zolpidem (AMBIEN) 10 MG tablet Take 5 mg by mouth at bedtime as needed for sleep.      azelastine (ASTELIN) 0.1 % nasal spray Place 2 sprays into both nostrils 2 (two) times daily. Use in each nostril as directed 30 mL 5   levocetirizine (XYZAL) 5 MG tablet TAKE 1 TABLET BY MOUTH EVERY EVENING 30 tablet 5   montelukast (SINGULAIR) 10 MG tablet TAKE 1 TABLET BY MOUTH EVERYDAY AT BEDTIME 30 tablet 5   traMADol (ULTRAM) 50 MG tablet Take 1 tablet (50 mg total) by mouth every 6 (six) hours as needed for severe pain or moderate pain. For AFTER surgery only, do not take and drive (Patient not taking: Reported on 07/29/2022) 5 tablet 0   No current facility-administered medications for this visit.     Known medication allergies: Allergies  Allergen Reactions   Oxycodone-Acetaminophen Nausea Only     Physical examination: Blood pressure 124/72, pulse 60, temperature 98 F (36.7 C), temperature source Temporal, resp. rate 18, last menstrual period 07/06/1998, SpO2 95 %.  General: Alert, interactive, in no acute distress. HEENT: PERRLA, TMs pearly gray, turbinates minimally edematous without discharge, post-pharynx non erythematous. Neck: Supple without lymphadenopathy. Lungs: Clear to auscultation without wheezing, rhonchi or rales. {no increased work of breathing. CV: Normal S1, S2 without murmurs. Abdomen: Nondistended, nontender. Skin: Warm and dry, without  lesions or rashes. Extremities:  No clubbing, cyanosis or edema. Neuro:   Grossly intact.  Diagnositics/Labs: None today  Assessment and plan:   Asthma  - continue Flovent 44 g  1 puff once a day at this time.  Once pollen season is in full swing if not having any increased asthma symptoms and meeting control goals will consider trial off Flovent  - asthma action plan if having asthma flare: take Flovent 2 puffs twice a day   - Continue Singulair 10 mg daily  - have access to albuterol inhaler 2 puffs every 4-6 hours as needed for cough/wheeze/shortness  of breath/chest tightness.  May use 15-20 minutes prior to activity.   Monitor frequency of use.    Asthma control goals:  Full participation in all desired activities (may need albuterol before activity) Albuterol use two time or less a week on average (not counting use with activity) Cough interfering with sleep two time or less a month Oral steroids no more than once a year No hospitalizations    Mixed rhinitis  - Continue Singulair as above  - Continue Xyzal every other day at this time.  If not noting increase in allergy symptoms come April in full blown pollen season then can continue weaning xyzal to as needed use.    - Continue Flonase 1-2 sprays each nostril daily as needed for congestion.  Use for 1-2 weeks at a time before stopping once symptoms improved.   - Continue Astelin 1-2 sprays 1-2 times a day as needed for nasal drainage/post-nasal drip   Follow-up in 6 months or sooner if needed  I appreciate the opportunity to take part in Jenilee's care. Please do not hesitate to contact me with questions.  Sincerely,   Prudy Feeler, MD Allergy/Immunology [facility]

## 2022-07-31 ENCOUNTER — Ambulatory Visit: Payer: PPO

## 2022-07-31 DIAGNOSIS — R262 Difficulty in walking, not elsewhere classified: Secondary | ICD-10-CM

## 2022-07-31 DIAGNOSIS — M5416 Radiculopathy, lumbar region: Secondary | ICD-10-CM

## 2022-07-31 DIAGNOSIS — R293 Abnormal posture: Secondary | ICD-10-CM

## 2022-07-31 DIAGNOSIS — M6281 Muscle weakness (generalized): Secondary | ICD-10-CM

## 2022-07-31 DIAGNOSIS — R252 Cramp and spasm: Secondary | ICD-10-CM

## 2022-07-31 NOTE — Therapy (Incomplete)
OUTPATIENT PHYSICAL THERAPY TREATMENT NOTE   Patient Name: [name] MRN: [id number] DOB:03-31-1951, 72 y.o., female Today's Date: 08/01/2022  END OF SESSION:   PT End of Session - 07/28/22 1141       Visit Number 15    Date for PT Re-Evaluation 08/04/22     Authorization Type [employer]     Progress Note Due on Visit 20     PT Start Time 1017    PT Stop Time 1100    PT Time Calculation (min) 35 min     Activity Tolerance Patient tolerated treatment well     Behavior During Therapy WFL for tasks assessed/performed       Past Medical History:  Diagnosis Date   Asthma    has rescue inhaler, now on Flovent   Chronic back pain    Diabetes mellitus without complication ([place])    on medformin, on Ozempic   GERD (gastroesophageal reflux disease)    History of COVID-19 04/05/2021   Hyperlipemia    Hypertension    Hypothyroidism    Insomnia    OAB (overactive bladder)    Seasonal allergies    Sleep apnea    uses a cpap   Past Surgical History:  Procedure Laterality Date   ANKLE ARTHROTOMY  2003   left-fx   APPENDECTOMY     back fusion  02/14/2019   L4 back fusion   BACK SURGERY  2010   lumb disk   BREAST LUMPECTOMY WITH RADIOACTIVE SEED LOCALIZATION Left 06/13/2019   Procedure: LEFT BREAST LUMPECTOMY WITH RADIOACTIVE SEED LOCALIZATION;  Surgeon: Erroll Luna, MD;  Location: [facility];  Service: General;  Laterality: Left;   CARPAL TUNNEL RELEASE     rt   CARPAL TUNNEL RELEASE Left 08/01/2013   Procedure: LEFT CARPAL TUNNEL RELEASE;  Surgeon: Wynonia Sours, MD;  Location: [facility];  Service: Orthopedics;  Laterality: Left;   CESAREAN SECTION  83,86   COLONOSCOPY     DILATATION & CURETTAGE/HYSTEROSCOPY WITH MYOSURE N/A 08/23/2018   Procedure: DILATATION & CURETTAGE/HYSTEROSCOPY WITH MYOSURE POLYPECTOMY;  Surgeon: Nunzio Cobbs, MD;  Location: [facility];  Service: Gynecology;  Laterality: N/A;   fibroid resection   DILATION AND CURETTAGE OF UTERUS     x2   MASS EXCISION N/A 08/04/2017   Procedure: EXCISION LIPOMA UPPER BACK x2 AND NECK;  Surgeon: Erroll Luna, MD;  Location: [facility];  Service: General;  Laterality: N/A;   NASAL SEPTOPLASTY W/ TURBINOPLASTY Bilateral 06/21/2015   Procedure: NASAL SEPTOPLASTY WITH BILATERAL TURBINATE REDUCTION;  Surgeon: Jerrell Belfast, MD;  Location: [place];  Service: ENT;  Laterality: Bilateral;   SINOSCOPY     TONSILLECTOMY     TUBAL LIGATION     VULVECTOMY N/A 12/24/2021   Procedure: WIDE EXCISION VULVECTOMY;  Surgeon: [name], MD;  Location[facility];  Service: Gynecology;  Laterality: N/A;   Patient Active Problem List   Diagnosis Date Noted   Melanoma in situ ([place]) 07/17/2022   Hypothyroidism 01/22/2022   Insomnia 01/22/2022   Lipoma 01/22/2022   Major depression, single episode 01/22/2022   Morbid obesity ([place]) 01/22/2022   Overactive bladder 01/22/2022   Type 2 diabetes mellitus without complications ([place]) 99/83/3825   Allergic rhinitis due to pollen 01/22/2022   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[place]) 08/30/2021   BMI 37.0-37.9, adult 08/30/2021   Spondylolisthesis of lumbosacral region 02/14/2019   Mild persistent asthma without complication 05/39/7673  Mixed rhinitis 06/10/2018   Obstructive sleep apnea 12/11/2016   Palpitations 06/17/2016   Deflected nasal septum 09/13/2015   Hypertrophy of nasal turbinates 09/13/2015   Deviated nasal septum 06/21/2015    Class: Chronic   HYPERLIPIDEMIA 11/18/2007   Essential hypertension 11/18/2007   Allergic rhinitis 11/18/2007   COUGH 11/18/2007    PCP: Saintclair Halsted, FNP   REFERRING PROVIDER: Starling Manns, MD  REFERRING DIAG: M54.16 (ICD-10-CM) - Radiculopathy, lumbar region  Rationale for Evaluation and Treatment: Rehabilitation  THERAPY DIAG:  M54.16 (ICD-10-CM) - Radiculopathy, lumbar region  ONSET DATE: 06/08/2022  SUBJECTIVE:                                                                                                                                                                                            SUBJECTIVE STATEMENT:  I'm having a little soreness today. Pain reported at 3/10.   PERTINENT HISTORY:  Scheduled for surgery for other gynecological issue on 07/29/22  PAIN:  Are you having pain? Yes: NPRS scale: 0/10 Pain location: low back and right leg Pain description: aching and the leg starts burning Aggravating factors: driving Relieving factors: Tylenol, gabapentin, baclofen  PRECAUTIONS: None  WEIGHT BEARING RESTRICTIONS: No  FALLS:  Has patient fallen in last 6 months? No  LIVING ENVIRONMENT: Lives with: lives with their family Lives in: House/apartment  OCCUPATION: retired Marine scientist   PLOF: Independent, Independent with basic ADLs, Independent with household mobility without device, Independent with community mobility without device, Independent with gait, and Independent with transfers  PATIENT GOALS: To eliminate leg pain and avoid further problems with my back.   NEXT MD VISIT: as needed  OBJECTIVE:   DIAGNOSTIC FINDINGS:  none  PATIENT SURVEYS:  FOTO 56 (goal is 48) 07/13/22: 42 FOTO  SCREENING FOR RED FLAGS: Bowel or bladder incontinence: No Spinal tumors: No Cauda equina syndrome: No Compression fracture: No Abdominal aneurysm: No  COGNITION: Overall cognitive status: Within functional limits for tasks assessed     SENSATION: Light touch: Impaired   MUSCLE LENGTH: Hamstrings: Right 60 deg; Left 60 deg Thomas test: Right pos ; Left pos  POSTURE:  Acquired scoliosis lumbar   LOWER EXTREMITY ROM:    WFL  LOWER EXTREMITY MMT:    Generally 4 to 4+/5 with exception  LUMBAR SPECIAL TESTS:  Straight leg raise test: Negative  FUNCTIONAL TESTS:  Eval: 5 times sit to stand: 14.67 Timed up and go (TUG): 9.52   07/15/22: 5x sit to stand: 12.2 seconds  GAIT: Distance  walked: 50 Assistive device utilized: None Level of assistance: Complete Independence Comments: antalgic   TODAY'S TREATMENT:  07/31/21: Nustep level 5 x 7 min with PT  present to discuss status Seated hamstring stretch 2x30 sec Seated piriformis stretch 2x30 sec Low trunk rotation: 3x20 seconds  Supine TA with concurrent Ball squeeze 2x10sec: verbal cues for TA activation Supine TA with concurrent hip abduction with red band 2x10sec: verbal cues for TA activation Sit to stand: holding 5# KB 2x10 Manual: addaday to Rt low back and gluteals.   07/28/21: Nustep level 5 x 7 min with PT  present to discuss status Seated hamstring stretch 2x30 sec Seated piriformis stretch 2x30 sec Low trunk rotation: 3x20 seconds  Supine TA with concurrent Ball squeeze 2x10sec: verbal cues for TA activation Supine TA with concurrent hip abduction with red band 2x10sec: verbal cues for TA activation Sit to stand: holding 5# KB 2x10 Manual: addaday to Rt low back and gluteals.   07/23/21: Nustep level 5 x 7 min with PT  present to discuss status Seated hamstring stretch 2x30 sec Seated piriformis stretch 2x30 sec Low trunk rotation: 3x20 seconds  Supine TA with concurrent Ball squeeze 2x10sec: verbal cues for TA activation Supine TA with concurrent hip abduction with yellow band 2x10sec: verbal cues for TA activation Sit to stand: holding 5# KB 2x10 Manual: addaday to Rt low back and gluteals.   PATIENT EDUCATION:  Education details: Initiated HEP Person educated: Patient Education method: Consulting civil engineer, Media planner, Verbal cues, and Handouts Education comprehension: verbalized understanding, returned demonstration, and verbal cues required  HOME EXERCISE PROGRAM: Access Code: [email or web address] URL: [email or web address] Date: 06/10/2022 Prepared by: Candyce Churn  Exercises - Standing Hamstring Stretch on Chair  - 1 x daily - 7 x weekly - 1 sets - 3 reps - 30 hold - Standing Hip  Flexor Stretch with Foot Elevated  - 1 x daily - 7 x weekly - 1 sets - 3 reps - 30 hold - Lying Prone  - 1 x daily - 7 x weekly - 1 sets - 1 reps - 2 min hold - Static Prone on Elbows  - 1 x daily - 7 x weekly - 1 sets - 1 reps - 2 min hold - Prone Press Up  - 1 x daily - 7 x weekly - 1 sets - 10 reps - Seated Piriformis Stretch with Trunk Bend  - 1 x daily - 7 x weekly - 1 sets - 3 reps - 30 hold  ASSESSMENT:  CLINICAL IMPRESSION: Savonna is progressing appropriately toward final goals.  She has not been as compliant with her HEP recently due to her surgery on 07/29/22. She will likely be more compliant now that this is out of the way and her path report was good.   Patient will benefit from skilled PT to address the below impairments and improve overall function.   OBJECTIVE IMPAIRMENTS: difficulty walking, decreased ROM, decreased strength, increased muscle spasms, impaired flexibility, impaired sensation, postural dysfunction, and pain.   ACTIVITY LIMITATIONS: carrying, lifting, bending, sitting, standing, squatting, sleeping, transfers, bathing, and dressing  PARTICIPATION LIMITATIONS: meal prep, cleaning, laundry, shopping, and community activity  PERSONAL FACTORS: Fitness and 1 comorbidity: pending gynecological surgery scheduled in January  are also affecting patient's functional outcome.   REHAB POTENTIAL: Good  CLINICAL DECISION MAKING: Stable/uncomplicated  EVALUATION COMPLEXITY: Low   GOALS: Goals reviewed with patient? Yes  SHORT TERM GOALS: Target date: 07/07/2022    Pain report to be no greater than 4/10  Baseline: Goal status: IN PROGRESS  2.  Patient will be independent with initial HEP  Baseline:  Goal status: MET  3.  Patient to report 50% improvement in overall symptoms Baseline: 70% (07/08/22) Goal status: MET   LONG TERM GOALS: Target date: 08/04/2022    Patient to be independent with advanced HEP  Baseline:  Goal status: In progress   2.  Patient to  report pain no greater than 2/10  Baseline: 7/10 when very active (07/13/22) Goal status: in progress   3.  Patient to report 85% improvement in overall symptoms  Baseline: 60-70% (07/13/22) Goal status: in progress   4.  FOTO score to be 63 Baseline: 59 (07/13/22) Goal status: In progress   5.  5 times sit to stand and TUG to improve by 3-5 sec Baseline: 5x sit to stand 12.2 seconds, improved by 2 seconds (07/15/22) Goal status: In progress   6.  Radicular symptoms to be centralized Baseline: mostly back, intermittent in leg (07/28/22) Goal status: In progress   PLAN:  PT FREQUENCY: 1-2x/week  PT DURATION: 8 weeks  PLANNED INTERVENTIONS: Therapeutic exercises, Therapeutic activity, Neuromuscular re-education, Balance training, Gait training, Patient/Family education, Self Care, Joint mobilization, Stair training, Aquatic Therapy, Dry Needling, Electrical stimulation, Spinal mobilization, Cryotherapy, Moist heat, Taping, Traction, Ultrasound, Ionotophoresis '4mg'$ /ml Dexamethasone, Manual therapy, and Re-evaluation.  PLAN FOR NEXT SESSION:  core strength, flexibility, manual as needed.  ERO next week.  Probable reduce to 1x/wk    Anderson Malta B. Anntionette Madkins, PT 08/01/22 9:47 PM   [facility] [address] [place], [place] [address] Phone # [phone] Fax [phone]

## 2022-08-04 ENCOUNTER — Ambulatory Visit: Payer: PPO

## 2022-08-04 DIAGNOSIS — R252 Cramp and spasm: Secondary | ICD-10-CM

## 2022-08-04 DIAGNOSIS — M6281 Muscle weakness (generalized): Secondary | ICD-10-CM

## 2022-08-04 DIAGNOSIS — R262 Difficulty in walking, not elsewhere classified: Secondary | ICD-10-CM

## 2022-08-04 DIAGNOSIS — M5416 Radiculopathy, lumbar region: Secondary | ICD-10-CM

## 2022-08-04 NOTE — Therapy (Signed)
OUTPATIENT PHYSICAL THERAPY TREATMENT NOTE   Patient Name: [name] MRN: [id number] DOB:30-May-1951, 72 y.o., female Today's Date: 08/04/2022  END OF SESSION:   PT End of Session - 07/28/22 1141       Visit Number 15    Date for PT Re-Evaluation 08/04/22     Authorization Type [employer]     Progress Note Due on Visit 20     PT Start Time 1017    PT Stop Time 1100    PT Time Calculation (min) 35 min     Activity Tolerance Patient tolerated treatment well     Behavior During Therapy WFL for tasks assessed/performed       Past Medical History:  Diagnosis Date   Asthma    has rescue inhaler, now on Flovent   Chronic back pain    Diabetes mellitus without complication ([place])    on medformin, on Ozempic   GERD (gastroesophageal reflux disease)    History of COVID-19 04/05/2021   Hyperlipemia    Hypertension    Hypothyroidism    Insomnia    OAB (overactive bladder)    Seasonal allergies    Sleep apnea    uses a cpap   Past Surgical History:  Procedure Laterality Date   ANKLE ARTHROTOMY  2003   left-fx   APPENDECTOMY     back fusion  02/14/2019   L4 back fusion   BACK SURGERY  2010   lumb disk   BREAST LUMPECTOMY WITH RADIOACTIVE SEED LOCALIZATION Left 06/13/2019   Procedure: LEFT BREAST LUMPECTOMY WITH RADIOACTIVE SEED LOCALIZATION;  Surgeon: Erroll Luna, MD;  Location: [facility];  Service: General;  Laterality: Left;   CARPAL TUNNEL RELEASE     rt   CARPAL TUNNEL RELEASE Left 08/01/2013   Procedure: LEFT CARPAL TUNNEL RELEASE;  Surgeon: Wynonia Sours, MD;  Location: [facility];  Service: Orthopedics;  Laterality: Left;   CESAREAN SECTION  83,86   COLONOSCOPY     DILATATION & CURETTAGE/HYSTEROSCOPY WITH MYOSURE N/A 08/23/2018   Procedure: DILATATION & CURETTAGE/HYSTEROSCOPY WITH MYOSURE POLYPECTOMY;  Surgeon: Nunzio Cobbs, MD;  [facility];  Service: Gynecology;  Laterality: N/A;   fibroid resection   DILATION AND CURETTAGE OF UTERUS     x2   MASS EXCISION N/A 08/04/2017   Procedure: EXCISION LIPOMA UPPER BACK x2 AND NECK;  Surgeon: Erroll Luna, MD;  Location: [facility];  Service: General;  Laterality: N/A;   NASAL SEPTOPLASTY W/ TURBINOPLASTY Bilateral 06/21/2015   Procedure: NASAL SEPTOPLASTY WITH BILATERAL TURBINATE REDUCTION;  Surgeon: Jerrell Belfast, MD;  Location: [facility];  Service: ENT;  Laterality: Bilateral;   SINOSCOPY     TONSILLECTOMY     TUBAL LIGATION     VULVECTOMY N/A 12/24/2021   Procedure: WIDE EXCISION VULVECTOMY;  Surgeon: [name], MD;  Location: [facility];  Service: Gynecology;  Laterality: N/A;   Patient Active Problem List   Diagnosis Date Noted   Melanoma in situ ([place]) 07/17/2022   Hypothyroidism 01/22/2022   Insomnia 01/22/2022   Lipoma 01/22/2022   Major depression, single episode 01/22/2022   Morbid obesity ([place]) 01/22/2022   Overactive bladder 01/22/2022   Type 2 diabetes mellitus without complications ([place]) 70/78/6754   Allergic rhinitis due to pollen 01/22/2022   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[place]) 08/30/2021   BMI 37.0-37.9, adult 08/30/2021   Spondylolisthesis of lumbosacral region 02/14/2019   Mild persistent asthma without complication 49/20/1007  Mixed rhinitis 06/10/2018   Obstructive sleep apnea 12/11/2016   Palpitations 06/17/2016   Deflected nasal septum 09/13/2015   Hypertrophy of nasal turbinates 09/13/2015   Deviated nasal septum 06/21/2015    Class: Chronic   HYPERLIPIDEMIA 11/18/2007   Essential hypertension 11/18/2007   Allergic rhinitis 11/18/2007   COUGH 11/18/2007    PCP: Saintclair Halsted, FNP   REFERRING PROVIDER: Starling Manns, MD  REFERRING DIAG: M54.16 (ICD-10-CM) - Radiculopathy, lumbar region  Rationale for Evaluation and Treatment: Rehabilitation  THERAPY DIAG:  M54.16 (ICD-10-CM) - Radiculopathy, lumbar region  ONSET DATE: 06/08/2022  SUBJECTIVE:                                                                                                                                                                                            SUBJECTIVE STATEMENT:  I am feeling really good.  I've been trying to walk more that my surgical site is healed.  I am 90% better.    PERTINENT HISTORY:  Scheduled for surgery for other gynecological issue on 07/29/22  PAIN:  Are you having pain? Yes: NPRS scale: 0/10 Pain location: low back and right leg Pain description: aching and the leg starts burning Aggravating factors: driving Relieving factors: Tylenol, gabapentin, baclofen  PRECAUTIONS: None  WEIGHT BEARING RESTRICTIONS: No  FALLS:  Has patient fallen in last 6 months? No  LIVING ENVIRONMENT: Lives with: lives with their family Lives in: House/apartment  OCCUPATION: retired Marine scientist   PLOF: Independent, Independent with basic ADLs, Independent with household mobility without device, Independent with community mobility without device, Independent with gait, and Independent with transfers  PATIENT GOALS: To eliminate leg pain and avoid further problems with my back.   NEXT MD VISIT: as needed  OBJECTIVE:   DIAGNOSTIC FINDINGS:  none  PATIENT SURVEYS:  FOTO 56 (goal is 45) 07/13/22: 69 FOTO 08/04/22: FOTO 67  SCREENING FOR RED FLAGS: Bowel or bladder incontinence: No Spinal tumors: No Cauda equina syndrome: No Compression fracture: No Abdominal aneurysm: No  COGNITION: Overall cognitive status: Within functional limits for tasks assessed     SENSATION: Light touch: Impaired   MUSCLE LENGTH: Hamstrings: Right 60 deg; Left 60 deg Thomas test: Right pos ; Left pos  POSTURE:  Acquired scoliosis lumbar   LOWER EXTREMITY ROM:    WFL  LOWER EXTREMITY MMT:    Generally 4 to 4+/5 with exception  LUMBAR SPECIAL TESTS:  Straight leg raise test: Negative  FUNCTIONAL TESTS:  Eval: 5 times sit to stand: 14.67 Timed up and go  (TUG): 9.52   07/15/22: 5x sit to stand: 12.2 seconds  GAIT: Distance walked:  50 Assistive device utilized: None Level of assistance: Complete Independence Comments: antalgic   TODAY'S TREATMENT:     08/04/21: Nustep level 5 x 7 min with PT  present to discuss status Seated hamstring stretch 2x30 sec Seated piriformis stretch 2x30 sec Low trunk rotation: 3x20 seconds  Supine TA with concurrent Ball squeeze 2x10sec: verbal cues for TA activation Supine TA with concurrent hip abduction with red band 2x10sec: verbal cues for TA activation Sit to stand: holding 5# KB 2x10 Manual: addaday to Rt low back and gluteals.   07/31/21: Nustep level 5 x 7 min with PT  present to discuss status Seated hamstring stretch 2x30 sec Seated piriformis stretch 2x30 sec Low trunk rotation: 3x20 seconds  Supine TA with concurrent Ball squeeze 2x10sec: verbal cues for TA activation Supine TA with concurrent hip abduction with red band 2x10sec: verbal cues for TA activation Sit to stand: holding 5# KB 2x10 Manual: addaday to Rt low back and gluteals.   07/28/21: Nustep level 5 x 7 min with PT  present to discuss status Seated hamstring stretch 2x30 sec Seated piriformis stretch 2x30 sec Low trunk rotation: 3x20 seconds  Supine TA with concurrent Ball squeeze 2x10sec: verbal cues for TA activation Supine TA with concurrent hip abduction with red band 2x10sec: verbal cues for TA activation Sit to stand: holding 5# KB 2x10 Manual: addaday to Rt low back and gluteals.    PATIENT EDUCATION:  Education details: Initiated HEP Person educated: Patient Education method: Consulting civil engineer, Media planner, Verbal cues, and Handouts Education comprehension: verbalized understanding, returned demonstration, and verbal cues required  HOME EXERCISE PROGRAM: Access Code: [email or web address] URL: [email or web address] Date: 06/10/2022 Prepared by: Candyce Churn  Exercises - Standing Hamstring Stretch on Chair   - 1 x daily - 7 x weekly - 1 sets - 3 reps - 30 hold - Standing Hip Flexor Stretch with Foot Elevated  - 1 x daily - 7 x weekly - 1 sets - 3 reps - 30 hold - Lying Prone  - 1 x daily - 7 x weekly - 1 sets - 1 reps - 2 min hold - Static Prone on Elbows  - 1 x daily - 7 x weekly - 1 sets - 1 reps - 2 min hold - Prone Press Up  - 1 x daily - 7 x weekly - 1 sets - 10 reps - Seated Piriformis Stretch with Trunk Bend  - 1 x daily - 7 x weekly - 1 sets - 3 reps - 30 hold  ASSESSMENT:  CLINICAL IMPRESSION: Pt reports 90% overall improvement in symptoms since the start of care.  FOTO is improved to 67, meeting goal.  She is independent and compliant in HEP for flexibility and strength and does well with TA contraction.  She has been able to be more active since her recent surgery.  Pt will D/C to HEP.    OBJECTIVE IMPAIRMENTS: difficulty walking, decreased ROM, decreased strength, increased muscle spasms, impaired flexibility, impaired sensation, postural dysfunction, and pain.   ACTIVITY LIMITATIONS: carrying, lifting, bending, sitting, standing, squatting, sleeping, transfers, bathing, and dressing  PARTICIPATION LIMITATIONS: meal prep, cleaning, laundry, shopping, and community activity  PERSONAL FACTORS: Fitness and 1 comorbidity: pending gynecological surgery scheduled in January  are also affecting patient's functional outcome.   REHAB POTENTIAL: Good  CLINICAL DECISION MAKING: Stable/uncomplicated  EVALUATION COMPLEXITY: Low   GOALS: Goals reviewed with patient? Yes  SHORT TERM GOALS: Target date: 07/07/2022    Pain report to be no greater than 4/10  Baseline: Goal status: IN PROGRESS  2.  Patient will be independent with initial HEP  Baseline:  Goal status: MET  3.  Patient to report 50% improvement in overall symptoms Baseline: 70% (07/08/22) Goal status: MET   LONG TERM GOALS: Target date: 08/05/22    Patient to be independent with advanced HEP  Baseline:  Goal status:  MET  2.  Patient to report pain no greater than 2/10  Baseline: 3/10 (08/04/22) Goal status: MET  3.  Patient to report 85% improvement in overall symptoms  Baseline: 90% (08/04/22) Goal status: MET  4.  FOTO score to be 63 Baseline: 67 (08/04/22) Goal status: MET   5.  5 times sit to stand and TUG to improve by 3-5 sec Baseline:  Goal status: In progress   6.  Radicular symptoms to be centralized Baseline: mostly back and gluteals (08/04/22) Goal status: MET  PLAN:  D/C PT to HEP PHYSICAL THERAPY DISCHARGE SUMMARY  Visits from Start of Care: 16  Current functional level related to goals / functional outcomes: See above for current status.    Remaining deficits: Intermittent LBP when more active.     Education / Equipment: HEP   Patient agrees to discharge. Patient goals were met. Patient is being discharged due to meeting the stated rehab goals.   Sigurd Sos, PT 08/04/22 11:44 AM    [facility] [address] [place], [place] [address] Phone # [phone] Fax [phone]

## 2022-08-05 ENCOUNTER — Telehealth: Payer: Self-pay | Admitting: *Deleted

## 2022-08-05 ENCOUNTER — Encounter: Payer: Self-pay | Admitting: *Deleted

## 2022-08-05 NOTE — Patient Outreach (Signed)
  Care Coordination   Initial Visit Note   08/05/2022 Name: HARLEA GOETZINGER MRN: [id number] DOB: Feb 12, 1951  DENIECE RANKIN is a 72 y.o. year old female who sees Saintclair Halsted, FNP for primary care. I spoke with  Justin Mend by phone today.  What matters to the patients health and wellness today?  No needs    Goals Addressed             This Visit's Progress    COMPLETED: care management activity       Care Coordination Interventions: Reviewed medications with patient and discussed adherence with no needed refills Reviewed scheduled/upcoming provider appointments including sufficient transportation source Screening for signs and symptoms of depression related to chronic disease state  Assessed social determinant of health barriers Educated on care management services with no immediate needs presented at this time.          SDOH assessments and interventions completed:  Yes  SDOH Interventions Today    Flowsheet Row Most Recent Value  SDOH Interventions   Food Insecurity Interventions Intervention Not Indicated  Housing Interventions Intervention Not Indicated  Transportation Interventions Intervention Not Indicated  Utilities Interventions Intervention Not Indicated        Care Coordination Interventions:  Yes, provided   Follow up plan: No further intervention required.   Encounter Outcome:  Pt. Visit Completed   Raina Mina, RN Care Management Coordinator [place] Office [phone]

## 2022-08-05 NOTE — Patient Instructions (Signed)
Visit Information  Thank you for taking time to visit with me today. Please don't hesitate to contact me if I can be of assistance to you.   Following are the goals we discussed today:   Goals Addressed             This Visit's Progress    COMPLETED: care management activity       Care Coordination Interventions: Reviewed medications with patient and discussed adherence with no needed refills Reviewed scheduled/upcoming provider appointments including sufficient transportation source Screening for signs and symptoms of depression related to chronic disease state  Assessed social determinant of health barriers Educated on care management services with no immediate needs presented at this time.          Please call the care guide team at [phone] if you need to cancel or reschedule your appointment.   If you are experiencing a Mental Health or [place] or need someone to talk to, please call the Suicide and Crisis Lifeline: 988  Patient verbalizes understanding of instructions and care plan provided today and agrees to view in [facility]. Active MyChart status and patient understanding of how to access instructions and care plan via MyChart confirmed with patient.     No further follow up required: no needs  Raina Mina, RN Care Management Coordinator [place] Office [phone]

## 2022-08-13 ENCOUNTER — Other Ambulatory Visit (HOSPITAL_BASED_OUTPATIENT_CLINIC_OR_DEPARTMENT_OTHER): Payer: Self-pay

## 2022-08-13 DIAGNOSIS — Z03818 Encounter for observation for suspected exposure to other biological agents ruled out: Secondary | ICD-10-CM | POA: Diagnosis not present

## 2022-08-13 DIAGNOSIS — R5383 Other fatigue: Secondary | ICD-10-CM | POA: Diagnosis not present

## 2022-08-13 DIAGNOSIS — R051 Acute cough: Secondary | ICD-10-CM | POA: Diagnosis not present

## 2022-08-13 DIAGNOSIS — J069 Acute upper respiratory infection, unspecified: Secondary | ICD-10-CM | POA: Diagnosis not present

## 2022-08-13 MED ORDER — GUAIFENESIN-CODEINE 100-10 MG/5ML PO SOLN
ORAL | 0 refills | Status: DC
  Filled 2022-08-13: qty 60, 4d supply, fill #0

## 2022-08-14 ENCOUNTER — Other Ambulatory Visit (HOSPITAL_BASED_OUTPATIENT_CLINIC_OR_DEPARTMENT_OTHER): Payer: Self-pay

## 2022-08-14 ENCOUNTER — Ambulatory Visit (HOSPITAL_COMMUNITY)
Admission: RE | Admit: 2022-08-14 | Discharge: 2022-08-14 | Disposition: A | Discharge status: Home / Self Care | Payer: PPO | Source: Ambulatory Visit | Attending: Gynecologic Oncology | Admitting: Gynecologic Oncology

## 2022-08-14 ENCOUNTER — Encounter (HOSPITAL_COMMUNITY): Payer: Self-pay

## 2022-08-14 DIAGNOSIS — R918 Other nonspecific abnormal finding of lung field: Secondary | ICD-10-CM | POA: Diagnosis not present

## 2022-08-14 DIAGNOSIS — J9811 Atelectasis: Secondary | ICD-10-CM | POA: Diagnosis not present

## 2022-08-14 DIAGNOSIS — C519 Malignant neoplasm of vulva, unspecified: Secondary | ICD-10-CM | POA: Diagnosis not present

## 2022-08-14 DIAGNOSIS — D259 Leiomyoma of uterus, unspecified: Secondary | ICD-10-CM | POA: Diagnosis not present

## 2022-08-14 DIAGNOSIS — I700 Atherosclerosis of aorta: Secondary | ICD-10-CM | POA: Diagnosis not present

## 2022-08-14 DIAGNOSIS — Z86006 Personal history of melanoma in-situ: Secondary | ICD-10-CM | POA: Diagnosis not present

## 2022-08-14 DIAGNOSIS — K8020 Calculus of gallbladder without cholecystitis without obstruction: Secondary | ICD-10-CM | POA: Diagnosis not present

## 2022-08-14 MED ORDER — IOHEXOL 9 MG/ML PO SOLN
ORAL | Status: AC
  Filled 2022-08-14: qty 1000

## 2022-08-14 MED ORDER — IOHEXOL 9 MG/ML PO SOLN
1000.0000 mL | ORAL | Status: AC
  Administered 2022-08-14: 1000 mL via ORAL

## 2022-08-14 MED ORDER — SODIUM CHLORIDE (PF) 0.9 % IJ SOLN
INTRAMUSCULAR | Status: AC
  Filled 2022-08-14: qty 50

## 2022-08-14 MED ORDER — IOHEXOL 300 MG/ML  SOLN
100.0000 mL | Freq: Once | INTRAMUSCULAR | Status: AC | PRN
  Administered 2022-08-14: 100 mL via INTRAVENOUS

## 2022-08-18 ENCOUNTER — Encounter: Payer: Self-pay | Admitting: Gynecologic Oncology

## 2022-08-18 NOTE — Progress Notes (Signed)
Called the patient. Discussed recent CT findings. She is currently sick with upper respiratory infection, likely the cause of left upper lobe findings. Will plan on close interval imaging.  Valarie Cones MD

## 2022-08-23 DIAGNOSIS — J4540 Moderate persistent asthma, uncomplicated: Secondary | ICD-10-CM | POA: Diagnosis not present

## 2022-08-23 DIAGNOSIS — J069 Acute upper respiratory infection, unspecified: Secondary | ICD-10-CM | POA: Diagnosis not present

## 2022-08-26 ENCOUNTER — Other Ambulatory Visit (HOSPITAL_BASED_OUTPATIENT_CLINIC_OR_DEPARTMENT_OTHER): Payer: Self-pay

## 2022-08-27 ENCOUNTER — Ambulatory Visit
Admission: RE | Admit: 2022-08-27 | Discharge: 2022-08-27 | Disposition: A | Discharge status: Home / Self Care | Payer: PPO | Source: Ambulatory Visit | Attending: Family Medicine | Admitting: Family Medicine

## 2022-08-27 ENCOUNTER — Other Ambulatory Visit: Payer: Self-pay | Admitting: Family Medicine

## 2022-08-27 DIAGNOSIS — R0602 Shortness of breath: Secondary | ICD-10-CM | POA: Diagnosis not present

## 2022-08-27 DIAGNOSIS — R5383 Other fatigue: Secondary | ICD-10-CM | POA: Diagnosis not present

## 2022-08-27 DIAGNOSIS — R053 Chronic cough: Secondary | ICD-10-CM

## 2022-08-27 DIAGNOSIS — J45901 Unspecified asthma with (acute) exacerbation: Secondary | ICD-10-CM | POA: Diagnosis not present

## 2022-08-27 DIAGNOSIS — J3489 Other specified disorders of nose and nasal sinuses: Secondary | ICD-10-CM | POA: Diagnosis not present

## 2022-08-27 DIAGNOSIS — R059 Cough, unspecified: Secondary | ICD-10-CM | POA: Diagnosis not present

## 2022-09-03 ENCOUNTER — Other Ambulatory Visit (HOSPITAL_BASED_OUTPATIENT_CLINIC_OR_DEPARTMENT_OTHER): Payer: Self-pay

## 2022-09-03 ENCOUNTER — Ambulatory Visit
Admission: RE | Admit: 2022-09-03 | Discharge: 2022-09-03 | Disposition: A | Discharge status: Home / Self Care | Payer: PPO | Source: Ambulatory Visit | Attending: Family Medicine | Admitting: Family Medicine

## 2022-09-03 ENCOUNTER — Other Ambulatory Visit: Payer: Self-pay | Admitting: Family Medicine

## 2022-09-03 DIAGNOSIS — E118 Type 2 diabetes mellitus with unspecified complications: Secondary | ICD-10-CM | POA: Diagnosis not present

## 2022-09-03 DIAGNOSIS — M25561 Pain in right knee: Secondary | ICD-10-CM

## 2022-09-03 DIAGNOSIS — E782 Mixed hyperlipidemia: Secondary | ICD-10-CM | POA: Diagnosis not present

## 2022-09-03 DIAGNOSIS — E039 Hypothyroidism, unspecified: Secondary | ICD-10-CM | POA: Diagnosis not present

## 2022-09-03 DIAGNOSIS — N3281 Overactive bladder: Secondary | ICD-10-CM | POA: Diagnosis not present

## 2022-09-03 DIAGNOSIS — I10 Essential (primary) hypertension: Secondary | ICD-10-CM | POA: Diagnosis not present

## 2022-09-03 DIAGNOSIS — Z0000 Encounter for general adult medical examination without abnormal findings: Secondary | ICD-10-CM | POA: Diagnosis not present

## 2022-09-03 DIAGNOSIS — Z1159 Encounter for screening for other viral diseases: Secondary | ICD-10-CM | POA: Diagnosis not present

## 2022-09-03 DIAGNOSIS — E559 Vitamin D deficiency, unspecified: Secondary | ICD-10-CM | POA: Diagnosis not present

## 2022-09-03 DIAGNOSIS — F3341 Major depressive disorder, recurrent, in partial remission: Secondary | ICD-10-CM | POA: Diagnosis not present

## 2022-09-03 DIAGNOSIS — E1142 Type 2 diabetes mellitus with diabetic polyneuropathy: Secondary | ICD-10-CM | POA: Diagnosis not present

## 2022-09-03 DIAGNOSIS — C519 Malignant neoplasm of vulva, unspecified: Secondary | ICD-10-CM | POA: Diagnosis not present

## 2022-09-03 MED ORDER — OZEMPIC (2 MG/DOSE) 8 MG/3ML ~~LOC~~ SOPN: 2.0000 mg | PEN_INJECTOR | SUBCUTANEOUS | 3 refills | Status: DC

## 2022-09-11 DIAGNOSIS — Z780 Asymptomatic menopausal state: Secondary | ICD-10-CM | POA: Diagnosis not present

## 2022-09-18 ENCOUNTER — Other Ambulatory Visit (HOSPITAL_BASED_OUTPATIENT_CLINIC_OR_DEPARTMENT_OTHER): Payer: Self-pay

## 2022-10-12 DIAGNOSIS — D239 Other benign neoplasm of skin, unspecified: Secondary | ICD-10-CM | POA: Diagnosis not present

## 2022-10-12 DIAGNOSIS — L578 Other skin changes due to chronic exposure to nonionizing radiation: Secondary | ICD-10-CM | POA: Diagnosis not present

## 2022-10-12 DIAGNOSIS — L814 Other melanin hyperpigmentation: Secondary | ICD-10-CM | POA: Diagnosis not present

## 2022-10-12 DIAGNOSIS — L570 Actinic keratosis: Secondary | ICD-10-CM | POA: Diagnosis not present

## 2022-10-12 DIAGNOSIS — C510 Malignant neoplasm of labium majus: Secondary | ICD-10-CM | POA: Diagnosis not present

## 2022-10-12 DIAGNOSIS — L821 Other seborrheic keratosis: Secondary | ICD-10-CM | POA: Diagnosis not present

## 2022-10-12 DIAGNOSIS — D225 Melanocytic nevi of trunk: Secondary | ICD-10-CM | POA: Diagnosis not present

## 2022-10-13 DIAGNOSIS — Z6837 Body mass index (BMI) 37.0-37.9, adult: Secondary | ICD-10-CM | POA: Diagnosis not present

## 2022-10-13 DIAGNOSIS — M5412 Radiculopathy, cervical region: Secondary | ICD-10-CM | POA: Diagnosis not present

## 2022-10-13 DIAGNOSIS — M4722 Other spondylosis with radiculopathy, cervical region: Secondary | ICD-10-CM | POA: Diagnosis not present

## 2022-10-14 ENCOUNTER — Other Ambulatory Visit: Payer: Self-pay | Admitting: Physician Assistant

## 2022-10-14 DIAGNOSIS — M5416 Radiculopathy, lumbar region: Secondary | ICD-10-CM

## 2022-10-14 DIAGNOSIS — M5412 Radiculopathy, cervical region: Secondary | ICD-10-CM

## 2022-10-15 ENCOUNTER — Ambulatory Visit: Payer: PPO | Attending: Physician Assistant | Admitting: Physical Therapy

## 2022-10-15 ENCOUNTER — Encounter: Payer: Self-pay | Admitting: Physical Therapy

## 2022-10-15 DIAGNOSIS — R252 Cramp and spasm: Secondary | ICD-10-CM | POA: Insufficient documentation

## 2022-10-15 DIAGNOSIS — R293 Abnormal posture: Secondary | ICD-10-CM | POA: Diagnosis not present

## 2022-10-15 DIAGNOSIS — M6281 Muscle weakness (generalized): Secondary | ICD-10-CM | POA: Insufficient documentation

## 2022-10-15 DIAGNOSIS — M5412 Radiculopathy, cervical region: Secondary | ICD-10-CM | POA: Insufficient documentation

## 2022-10-15 DIAGNOSIS — G4733 Obstructive sleep apnea (adult) (pediatric): Secondary | ICD-10-CM | POA: Diagnosis not present

## 2022-10-15 NOTE — Therapy (Signed)
OUTPATIENT PHYSICAL THERAPY CERVICAL TREATMENT   Patient Name: [name] MRN: [id number] DOB:1950-07-07, 72 y.o., female Today's Date: 10/16/2022  END OF SESSION:  PT End of Session - 10/16/22 0850     Visit Number 2    Number of Visits 12    Date for PT Re-Evaluation 12/03/22    Authorization Type [employer]    Progress Note Due on Visit 10    PT Start Time 0846    PT Stop Time 0926    PT Time Calculation (min) 40 min    Activity Tolerance Patient tolerated treatment well    Behavior During Therapy [facility] for tasks assessed/performed              Past Medical History:  Diagnosis Date   Asthma    has rescue inhaler, now on Flovent   Chronic back pain    Diabetes mellitus without complication    on medformin, on Ozempic   GERD (gastroesophageal reflux disease)    History of COVID-19 04/05/2021   Hyperlipemia    Hypertension    Hypothyroidism    Insomnia    melanoma 11/2021   vulva   OAB (overactive bladder)    Seasonal allergies    Sleep apnea    uses a cpap   Past Surgical History:  Procedure Laterality Date   ANKLE ARTHROTOMY  2003   left-fx   APPENDECTOMY     back fusion  02/14/2019   L4 back fusion   BACK SURGERY  2010   lumb disk   BREAST LUMPECTOMY WITH RADIOACTIVE SEED LOCALIZATION Left 06/13/2019   Procedure: LEFT BREAST LUMPECTOMY WITH RADIOACTIVE SEED LOCALIZATION;  Surgeon: Harriette Bouillon, MD;  Location: [facility];  Service: General;  Laterality: Left;   CARPAL TUNNEL RELEASE     rt   CARPAL TUNNEL RELEASE Left 08/01/2013   Procedure: LEFT CARPAL TUNNEL RELEASE;  Surgeon: Nicki Reaper, MD;  Location: [facility];  Service: Orthopedics;  Laterality: Left;   CESAREAN SECTION  83,86   COLONOSCOPY     DILATATION & CURETTAGE/HYSTEROSCOPY WITH MYOSURE N/A 08/23/2018   Procedure: DILATATION & CURETTAGE/HYSTEROSCOPY WITH MYOSURE POLYPECTOMY;  Surgeon: Patton Salles, MD;  Location: [facility];  Service: Gynecology;  Laterality: N/A;  fibroid resection   DILATION AND CURETTAGE OF UTERUS     x2   MASS EXCISION N/A 08/04/2017   Procedure: EXCISION LIPOMA UPPER BACK x2 AND NECK;  Surgeon: Harriette Bouillon, MD;  Location: [facility];  Service: General;  Laterality: N/A;   NASAL SEPTOPLASTY W/ TURBINOPLASTY Bilateral 06/21/2015   Procedure: NASAL SEPTOPLASTY WITH BILATERAL TURBINATE REDUCTION;  Surgeon: Osborn Coho, MD;  Location: [facility];  Service: ENT;  Laterality: Bilateral;   SINOSCOPY     TONSILLECTOMY     TUBAL LIGATION     VULVECTOMY N/A 12/24/2021   Procedure: WIDE EXCISION VULVECTOMY;  Surgeon: Carver Fila, MD;  Location: [facility];  Service: Gynecology;  Laterality: N/A;   Patient Active Problem List   Diagnosis Date Noted   Melanoma in situ 07/17/2022   Hypothyroidism 01/22/2022   Insomnia 01/22/2022   Lipoma 01/22/2022   Major depression, single episode 01/22/2022   Morbid obesity 01/22/2022   Overactive bladder 01/22/2022   Type 2 diabetes mellitus without complications 01/22/2022   Allergic rhinitis due to pollen 01/22/2022   Melanoma of vulva 08/30/2021   BMI 37.0-37.9, adult 08/30/2021   Spondylolisthesis of lumbosacral region 02/14/2019  Mild persistent asthma without complication 06/10/2018   Mixed rhinitis 06/10/2018   Obstructive sleep apnea 12/11/2016   Palpitations 06/17/2016   Deflected nasal septum 09/13/2015   Hypertrophy of nasal turbinates 09/13/2015   Deviated nasal septum 06/21/2015    Class: Chronic   HYPERLIPIDEMIA 11/18/2007   Essential hypertension 11/18/2007   Allergic rhinitis 11/18/2007   COUGH 11/18/2007    PCP: Camie PatienceHayes, Romana S, FNP   REFERRING PROVIDER: William HamburgerHeagen, Kristyn, PA  REFERRING DIAG:  [phone] (ICD-10-CM) - Brachial neuritis  M54.16 (ICD-10-CM) - Lumbar radiculopathy    THERAPY DIAG:  Abnormal posture  Muscle weakness (generalized)  Radiculopathy, cervical  region  Rationale for Evaluation and Treatment: Rehabilitation  ONSET DATE: 3+ months    SUBJECTIVE:                                                                                                                                                                                                         SUBJECTIVE STATEMENT: Pt states that she slept well last night. She continues to note pain in her neck with tingling into her arm.   Pt states that she had an insidious onset of tingling in her R UE. She reports no know MOI. Pt to receive an MRI of lower back and neck on April 27th. Pt is able to reduce symptoms with head tilt to the L.  Hand dominance: Right  PERTINENT HISTORY:  Asthma, chronic back pain, DM, GERD, HTN, hypothyroidism, Sleep apnea.   PAIN:  Are you having pain? Yes: NPRS scale: 4-5/10 Pain location: R upper extremity into hand.  Pain description: N/T Aggravating factors: Head in neutral position, sleeping positions,  Relieving factors: Tilting head to R, traction, DN.  PRECAUTIONS: None   WEIGHT BEARING RESTRICTIONS: No   FALLS:  Has patient fallen in last 6 months? No   LIVING ENVIRONMENT: Lives with: lives with their family Lives in: House/apartment   OCCUPATION: retired Engineer, civil (consulting)nurse    PLOF: Independent, Independent with basic ADLs, Independent with household mobility without device, Independent with community mobility without device, Independent with gait, and Independent with transfers   PATIENT GOALS: To eliminate neck pain and avoid further problems.    NEXT MD VISIT: as needed  OBJECTIVE:   DIAGNOSTIC FINDINGS:  None recently.   PATIENT SURVEYS:  FOTO 45.49%  COGNITION: Overall cognitive status: Within functional limits for tasks assessed  SENSATION: N/T into thumb and index finger.   POSTURE: rounded shoulders, forward head, and Head tilt to L.   PALPATION: Tenderness to R cervical paraspinals and into UT. Pt reports  her index finger being  very itchy.    CERVICAL ROM:   Active ROM A/PROM (deg) eval  Flexion 56  Extension 18 with numbness, tingling  Right lateral flexion 40  Left lateral flexion 33 with numbness, tingling and stiffness reported.  Right rotation 26 tight, numbness, tingling  Left rotation 45   (Blank rows = not tested)  UPPER EXTREMITY ROM:  Active ROM Right eval Left eval  Shoulder flexion [facility]  Shoulder abduction [facility]  Shoulder internal rotation [facility]  Shoulder external rotation WFL WFL   (Blank rows = not tested)  UPPER EXTREMITY MMT:  MMT Right eval Left eval  Shoulder flexion 4- 4-  Shoulder abduction 4- 4-  Shoulder internal rotation 4 4  Shoulder external rotation 4 4  Grip strength 50 53   (Blank rows = not tested)  CERVICAL SPECIAL TESTS:  Upper limb tension test (ULTT): Positive and Spurling's test: Positive  TODAY'S TREATMENT:   DATE: 10/16/2022 UBE 2 fwd/ 2 bkwd UT/ LS stretch 2x30 sec holds.  Chin tucks Nerve glides STM following TPDN to R UT, cervical paraspinals.   Trigger Point Dry-Needling  Treatment instructions: Expect mild to moderate muscle soreness. S/S of pneumothorax if dry needled over a lung field, and to seek immediate medical attention should they occur. Patient verbalized understanding of these instructions and education.  Patient Consent Given: Yes Education handout provided: Previously provided Muscles treated: R posterior UT, cervical paraspinals, suboccipitals.  Electrical stimulation performed: No Parameters: N/A Treatment response/outcome: No adverse effects. Pt reports elimination of tingling symptoms.                                                                                                                             DATE: Creating, reviewing, and completing below HEP   PATIENT EDUCATION:  Education details: Educated pt on anatomy and physiology of current symptoms, FOTO, diagnosis, prognosis, HEP,  and POC. Person educated:  Patient Education method: Medical illustrator Education comprehension: verbalized understanding and returned demonstration  HOME EXERCISE PROGRAM: Access Code: [email or web address] URL: [email or web address] Date: 10/15/2022 Prepared by: Royal Hawthorn  Exercises - Seated Scapular Retraction  - 2 x daily - 7 x weekly - 2 sets - 10 reps - Seated Upper Trapezius Stretch  - 2 x daily - 7 x weekly - 2 sets - 2 reps - 30 hold - Seated Levator Scapulae Stretch  - 2 x daily - 7 x weekly - 2 sets - 2 reps - 30 hold - Putty Squeezes  - 2 x daily - 7 x weekly - 2 sets - 10 reps - Median Nerve Tensioner  - 2 x daily - 7 x weekly - 2 sets - 10 reps  ASSESSMENT:  CLINICAL IMPRESSION: Pt reports to first f/u appt with continued pain and tingling. She has been active with her HEP and continues to require cues for proper form with nerve glides. Session with focus on symptom reduction with pt  responding well to TPDN and STM with no tingling noted after. Pt is interested in possibly buying a at home traction machine. Plan to incorporate parascapular strengthening next session if appropriate. Encouraged pt to bring it in to ensure proper usage if brought. Pt will continue to benefit from skilled PT to address continued deficits.    OBJECTIVE IMPAIRMENTS: decreased activity tolerance, decreased shoulder mobility, decreased ROM, decreased strength, impaired flexibility, impaired UE use, postural dysfunction, and pain.  ACTIVITY LIMITATIONS: reaching, lifting, carry,  cleaning, driving, and or occupation  PERSONAL FACTORS:  also affecting patient's functional outcome.  REHAB POTENTIAL: Good  CLINICAL DECISION MAKING: Stable/uncomplicated  EVALUATION COMPLEXITY: Low    GOALS: Short term PT Goals Target date: 10/29/2022 Pt will be I and compliant with HEP. Baseline:  Goal status: New Pt will decrease pain by 25% overall Baseline: Goal status: New  Long term PT goals Target date:  12/03/2022  Pt will improve cervical shoulder AROM to [facility] to improve functional reaching Baseline: Goal status: New Pt will improve bilat shoulder strength to at least 4+/5 MMT to improve functional strength Baseline: Goal status: New Pt will improve FOTO to at least 57% functional to show improved function Baseline: Goal status: New Pt will reduce pain to overall less than <2/10 with usual activity and work activity. Baseline: Goal status: New Pt will be able to hold head in neutral without onset of symptoms.  Baseline: Goal status: New Pt will improve posture with min cues.  Baseline: Goal status: New  PLAN: PT FREQUENCY: 2x per week   PT DURATION: 6 weeks.   PLANNED INTERVENTIONS (unless contraindicated): aquatic PT, Canalith repositioning, cryotherapy, Electrical stimulation, Iontophoresis with 4 mg/ml dexamethasome, Moist heat, traction, Ultrasound, gait training, Therapeutic exercise, balance training, neuromuscular re-education, patient/family education, prosthetic training, manual techniques, passive ROM, dry needling, taping, vasopnuematic device, vestibular, spinal manipulations, joint manipulations  PLAN FOR NEXT SESSION: review/ update HEP, Traction, DN, parascapular strengthening, UBE, postural control.     Champ Mungo, PT 10/16/2022, 9:33 AM

## 2022-10-15 NOTE — Therapy (Signed)
OUTPATIENT PHYSICAL THERAPY CERVICAL EVALUATION   Patient Name: [name] MRN: [id number] DOB:02-10-1951, 72 y.o., female Today's Date: 10/15/2022  END OF SESSION:  PT End of Session - 10/15/22 1311     Visit Number 1    Number of Visits 12    Date for PT Re-Evaluation 12/03/22    Authorization Type [employer]    Progress Note Due on Visit 10    PT Start Time 1145    PT Stop Time 1230    PT Time Calculation (min) 45 min    Activity Tolerance Patient tolerated treatment well    Behavior During Therapy WFL for tasks assessed/performed             Past Medical History:  Diagnosis Date   Asthma    has rescue inhaler, now on Flovent   Chronic back pain    Diabetes mellitus without complication    on medformin, on Ozempic   GERD (gastroesophageal reflux disease)    History of COVID-19 04/05/2021   Hyperlipemia    Hypertension    Hypothyroidism    Insomnia    melanoma 11/2021   vulva   OAB (overactive bladder)    Seasonal allergies    Sleep apnea    uses a cpap   Past Surgical History:  Procedure Laterality Date   ANKLE ARTHROTOMY  2003   left-fx   APPENDECTOMY     back fusion  02/14/2019   L4 back fusion   BACK SURGERY  2010   lumb disk   BREAST LUMPECTOMY WITH RADIOACTIVE SEED LOCALIZATION Left 06/13/2019   Procedure: LEFT BREAST LUMPECTOMY WITH RADIOACTIVE SEED LOCALIZATION;  Surgeon: Harriette Bouillonornett, Thomas, MD;  Location: [facility];  Service: General;  Laterality: Left;   CARPAL TUNNEL RELEASE     rt   CARPAL TUNNEL RELEASE Left 08/01/2013   Procedure: LEFT CARPAL TUNNEL RELEASE;  Surgeon: Nicki ReaperGary R Kuzma, MD;  Location: [facility];  Service: Orthopedics;  Laterality: Left;   CESAREAN SECTION  83,86   COLONOSCOPY     DILATATION & CURETTAGE/HYSTEROSCOPY WITH MYOSURE N/A 08/23/2018   Procedure: DILATATION & CURETTAGE/HYSTEROSCOPY WITH MYOSURE POLYPECTOMY;  Surgeon: Patton SallesAmundson C Silva, Brook E, MD;  [facility];  Service: Gynecology;  Laterality: N/A;  fibroid resection   DILATION AND CURETTAGE OF UTERUS     x2   MASS EXCISION N/A 08/04/2017   Procedure: EXCISION LIPOMA UPPER BACK x2 AND NECK;  Surgeon: Harriette Bouillonornett, Thomas, MD;  Location: [facility];  Service: General;  Laterality: N/A;   NASAL SEPTOPLASTY W/ TURBINOPLASTY Bilateral 06/21/2015   Procedure: NASAL SEPTOPLASTY WITH BILATERAL TURBINATE REDUCTION;  Surgeon: Osborn Cohoavid Shoemaker, MD;  [facility];  Service: ENT;  Laterality: Bilateral;   SINOSCOPY     TONSILLECTOMY     TUBAL LIGATION     VULVECTOMY N/A 12/24/2021   Procedure: WIDE EXCISION VULVECTOMY;  Surgeon: Carver Filaucker, Katherine R, MD;  [facility];  Service: Gynecology;  Laterality: N/A;   Patient Active Problem List   Diagnosis Date Noted   Melanoma in situ 07/17/2022   Hypothyroidism 01/22/2022   Insomnia 01/22/2022   Lipoma 01/22/2022   Major depression, single episode 01/22/2022   Morbid obesity 01/22/2022   Overactive bladder 01/22/2022   Type 2 diabetes mellitus without complications 01/22/2022   Allergic rhinitis due to pollen 01/22/2022   Melanoma of vulva 08/30/2021   BMI 37.0-37.9, adult 08/30/2021   Spondylolisthesis of lumbosacral region 02/14/2019  Mild persistent asthma without complication 06/10/2018   Mixed rhinitis 06/10/2018   Obstructive sleep apnea 12/11/2016   Palpitations 06/17/2016   Deflected nasal septum 09/13/2015   Hypertrophy of nasal turbinates 09/13/2015   Deviated nasal septum 06/21/2015    Class: Chronic   HYPERLIPIDEMIA 11/18/2007   Essential hypertension 11/18/2007   Allergic rhinitis 11/18/2007   COUGH 11/18/2007    PCP: Camie Patience, FNP   REFERRING PROVIDER: William Hamburger, PA  REFERRING DIAG:  [phone] (ICD-10-CM) - Brachial neuritis  M54.16 (ICD-10-CM) - Lumbar radiculopathy    THERAPY DIAG:  Abnormal posture  Muscle weakness (generalized)  Radiculopathy, cervical  region  Rationale for Evaluation and Treatment: Rehabilitation  ONSET DATE: 3+ months    SUBJECTIVE:                                                                                                                                                                                                         SUBJECTIVE STATEMENT: Pt states that she had an insidious onset of tingling in her R UE. She reports no know MOI. Pt to receive an MRI of lower back and neck on April 27th. Pt is able to reduce symptoms with head tilt to the L.  Hand dominance: Right  PERTINENT HISTORY:  Asthma, chronic back pain, DM, GERD, HTN, hypothyroidism, Sleep apnea.   PAIN:  Are you having pain? Yes: NPRS scale: 4-5/10 Pain location: R upper extremity into hand.  Pain description: N/T Aggravating factors: Head in neutral position, sleeping positions,  Relieving factors: Tilting head to R, traction, DN.  PRECAUTIONS: None   WEIGHT BEARING RESTRICTIONS: No   FALLS:  Has patient fallen in last 6 months? No   LIVING ENVIRONMENT: Lives with: lives with their family Lives in: House/apartment   OCCUPATION: retired Engineer, civil (consulting)    PLOF: Independent, Independent with basic ADLs, Independent with household mobility without device, Independent with community mobility without device, Independent with gait, and Independent with transfers   PATIENT GOALS: To eliminate neck pain and avoid further problems.    NEXT MD VISIT: as needed  OBJECTIVE:   DIAGNOSTIC FINDINGS:  None recently.   PATIENT SURVEYS:  FOTO 45.49%  COGNITION: Overall cognitive status: Within functional limits for tasks assessed  SENSATION: N/T into thumb and index finger.   POSTURE: rounded shoulders, forward head, and Head tilt to L.   PALPATION: Tenderness to R cervical paraspinals and into UT. Pt reports her index finger being very itchy.    CERVICAL ROM:   Active ROM A/PROM (deg) eval  Flexion 56  Extension  18 with numbness,  tingling  Right lateral flexion 40  Left lateral flexion 33 with numbness, tingling and stiffness reported.  Right rotation 26 tight, numbness, tingling  Left rotation 45   (Blank rows = not tested)  UPPER EXTREMITY ROM:  Active ROM Right eval Left eval  Shoulder flexion [facility]  Shoulder abduction [facility]  Shoulder internal rotation [facility]  Shoulder external rotation WFL WFL   (Blank rows = not tested)  UPPER EXTREMITY MMT:  MMT Right eval Left eval  Shoulder flexion 4- 4-  Shoulder abduction 4- 4-  Shoulder internal rotation 4 4  Shoulder external rotation 4 4  Grip strength 50 53   (Blank rows = not tested)  CERVICAL SPECIAL TESTS:  Upper limb tension test (ULTT): Positive and Spurling's test: Positive  TODAY'S TREATMENT:                                                                                                                              DATE: Creating, reviewing, and completing below HEP   PATIENT EDUCATION:  Education details: Educated pt on anatomy and physiology of current symptoms, FOTO, diagnosis, prognosis, HEP,  and POC. Person educated: Patient Education method: Medical illustrator Education comprehension: verbalized understanding and returned demonstration  HOME EXERCISE PROGRAM: Access Code: [email or web address] URL: [email or web address] Date: 10/15/2022 Prepared by: Royal Hawthorn  Exercises - Seated Scapular Retraction  - 2 x daily - 7 x weekly - 2 sets - 10 reps - Seated Upper Trapezius Stretch  - 2 x daily - 7 x weekly - 2 sets - 2 reps - 30 hold - Seated Levator Scapulae Stretch  - 2 x daily - 7 x weekly - 2 sets - 2 reps - 30 hold - Putty Squeezes  - 2 x daily - 7 x weekly - 2 sets - 10 reps - Median Nerve Tensioner  - 2 x daily - 7 x weekly - 2 sets - 10 reps  ASSESSMENT:  CLINICAL IMPRESSION: Patient referred to PT for cervical radiculopathy. She demonstrates decreased cervical ROM and significant weakness in bilat UE's.  Pt prefers to hold her head to the L to help decompress spine with improvements in symptoms. She is unable to look behind her or above her without overcompensation from shoulder/ trunk rotation. Patient will benefit from skilled PT to address below impairments, limitations and improve overall function.  OBJECTIVE IMPAIRMENTS: decreased activity tolerance, decreased shoulder mobility, decreased ROM, decreased strength, impaired flexibility, impaired UE use, postural dysfunction, and pain.  ACTIVITY LIMITATIONS: reaching, lifting, carry,  cleaning, driving, and or occupation  PERSONAL FACTORS:  also affecting patient's functional outcome.  REHAB POTENTIAL: Good  CLINICAL DECISION MAKING: Stable/uncomplicated  EVALUATION COMPLEXITY: Low    GOALS: Short term PT Goals Target date: 10/29/2022 Pt will be I and compliant with HEP. Baseline:  Goal status: New Pt will decrease pain by 25% overall Baseline: Goal status: New  Long term PT goals Target date:  12/03/2022  Pt will improve cervical shoulder AROM to [facility] to improve functional reaching Baseline: Goal status: New Pt will improve bilat shoulder strength to at least 4+/5 MMT to improve functional strength Baseline: Goal status: New Pt will improve FOTO to at least 57% functional to show improved function Baseline: Goal status: New Pt will reduce pain to overall less than <2/10 with usual activity and work activity. Baseline: Goal status: New Pt will be able to hold head in neutral without onset of symptoms.  Baseline: Goal status: New Pt will improve posture with min cues.  Baseline: Goal status: New  PLAN: PT FREQUENCY: 2x per week   PT DURATION: 6 weeks.   PLANNED INTERVENTIONS (unless contraindicated): aquatic PT, Canalith repositioning, cryotherapy, Electrical stimulation, Iontophoresis with 4 mg/ml dexamethasome, Moist heat, traction, Ultrasound, gait training, Therapeutic exercise, balance training, neuromuscular  re-education, patient/family education, prosthetic training, manual techniques, passive ROM, dry needling, taping, vasopnuematic device, vestibular, spinal manipulations, joint manipulations  PLAN FOR NEXT SESSION: review/ update HEP, Traction, DN, parascapular strengthening, UBE, postural control.     Champ Mungo, PT 10/15/2022, 1:12 PM

## 2022-10-16 ENCOUNTER — Encounter: Payer: Self-pay | Admitting: Physical Therapy

## 2022-10-16 ENCOUNTER — Other Ambulatory Visit (HOSPITAL_BASED_OUTPATIENT_CLINIC_OR_DEPARTMENT_OTHER): Payer: Self-pay

## 2022-10-16 ENCOUNTER — Ambulatory Visit: Payer: PPO | Admitting: Physical Therapy

## 2022-10-16 DIAGNOSIS — M5412 Radiculopathy, cervical region: Secondary | ICD-10-CM

## 2022-10-16 DIAGNOSIS — R293 Abnormal posture: Secondary | ICD-10-CM

## 2022-10-16 DIAGNOSIS — M6281 Muscle weakness (generalized): Secondary | ICD-10-CM

## 2022-10-19 ENCOUNTER — Ambulatory Visit: Payer: PPO | Attending: Orthopaedic Surgery

## 2022-10-19 DIAGNOSIS — M6281 Muscle weakness (generalized): Secondary | ICD-10-CM | POA: Insufficient documentation

## 2022-10-19 DIAGNOSIS — M5412 Radiculopathy, cervical region: Secondary | ICD-10-CM | POA: Diagnosis not present

## 2022-10-19 DIAGNOSIS — R293 Abnormal posture: Secondary | ICD-10-CM | POA: Insufficient documentation

## 2022-10-19 NOTE — Therapy (Signed)
OUTPATIENT PHYSICAL THERAPY CERVICAL TREATMENT   Patient Name: [name] MRN: [id number] DOB:07-Jun-1951, 71 y.o., female Today's Date: 10/19/2022  END OF SESSION:  PT End of Session - 10/19/22 0926     Visit Number 3    Date for PT Re-Evaluation 12/03/22    Authorization Type [employer]    Progress Note Due on Visit 10    PT Start Time 0847    PT Stop Time 0932    PT Time Calculation (min) 45 min    Activity Tolerance Patient tolerated treatment well    Behavior During Therapy [facility] for tasks assessed/performed               Past Medical History:  Diagnosis Date   Asthma    has rescue inhaler, now on Flovent   Chronic back pain    Diabetes mellitus without complication    on medformin, on Ozempic   GERD (gastroesophageal reflux disease)    History of COVID-19 04/05/2021   Hyperlipemia    Hypertension    Hypothyroidism    Insomnia    melanoma 11/2021   vulva   OAB (overactive bladder)    Seasonal allergies    Sleep apnea    uses a cpap   Past Surgical History:  Procedure Laterality Date   ANKLE ARTHROTOMY  2003   left-fx   APPENDECTOMY     back fusion  02/14/2019   L4 back fusion   BACK SURGERY  2010   lumb disk   BREAST LUMPECTOMY WITH RADIOACTIVE SEED LOCALIZATION Left 06/13/2019   Procedure: LEFT BREAST LUMPECTOMY WITH RADIOACTIVE SEED LOCALIZATION;  Surgeon: Harriette Bouillon, MD;  Location: [facility];  Service: General;  Laterality: Left;   CARPAL TUNNEL RELEASE     rt   CARPAL TUNNEL RELEASE Left 08/01/2013   Procedure: LEFT CARPAL TUNNEL RELEASE;  Surgeon: Nicki Reaper, MD;  Location: [facility];  Service: Orthopedics;  Laterality: Left;   CESAREAN SECTION  83,86   COLONOSCOPY     DILATATION & CURETTAGE/HYSTEROSCOPY WITH MYOSURE N/A 08/23/2018   Procedure: DILATATION & CURETTAGE/HYSTEROSCOPY WITH MYOSURE POLYPECTOMY;  Surgeon: Patton Salles, MD;  Location: [facility];  Service:  Gynecology;  Laterality: N/A;  fibroid resection   DILATION AND CURETTAGE OF UTERUS     x2   MASS EXCISION N/A 08/04/2017   Procedure: EXCISION LIPOMA UPPER BACK x2 AND NECK;  Surgeon: Harriette Bouillon, MD;  Location: [facility];  Service: General;  Laterality: N/A;   NASAL SEPTOPLASTY W/ TURBINOPLASTY Bilateral 06/21/2015   Procedure: NASAL SEPTOPLASTY WITH BILATERAL TURBINATE REDUCTION;  Surgeon: Osborn Coho, MD;  Location: [facility];  Service: ENT;  Laterality: Bilateral;   SINOSCOPY     TONSILLECTOMY     TUBAL LIGATION     VULVECTOMY N/A 12/24/2021   Procedure: WIDE EXCISION VULVECTOMY;  Surgeon: Carver Fila, MD;  Location: [facility];  Service: Gynecology;  Laterality: N/A;   Patient Active Problem List   Diagnosis Date Noted   Melanoma in situ 07/17/2022   Hypothyroidism 01/22/2022   Insomnia 01/22/2022   Lipoma 01/22/2022   Major depression, single episode 01/22/2022   Morbid obesity 01/22/2022   Overactive bladder 01/22/2022   Type 2 diabetes mellitus without complications 01/22/2022   Allergic rhinitis due to pollen 01/22/2022   Melanoma of vulva 08/30/2021   BMI 37.0-37.9, adult 08/30/2021   Spondylolisthesis of lumbosacral region 02/14/2019   Mild persistent asthma without  complication 06/10/2018   Mixed rhinitis 06/10/2018   Obstructive sleep apnea 12/11/2016   Palpitations 06/17/2016   Deflected nasal septum 09/13/2015   Hypertrophy of nasal turbinates 09/13/2015   Deviated nasal septum 06/21/2015    Class: Chronic   HYPERLIPIDEMIA 11/18/2007   Essential hypertension 11/18/2007   Allergic rhinitis 11/18/2007   COUGH 11/18/2007    PCP: Camie Patience, FNP   REFERRING PROVIDER: William Hamburger, PA  REFERRING DIAG:  [phone] (ICD-10-CM) - Brachial neuritis  M54.16 (ICD-10-CM) - Lumbar radiculopathy    THERAPY DIAG:  Abnormal posture  Muscle weakness (generalized)  Radiculopathy, cervical region  Rationale for  Evaluation and Treatment: Rehabilitation  ONSET DATE: 3+ months    SUBJECTIVE:                                                                                                                                                                                                         SUBJECTIVE STATEMENT: DN helped last session.  My arm pain is nagging today.  I have MRI on 10/31/22  Pt states that she had an insidious onset of tingling in her R UE. She reports no know MOI. Pt to receive an MRI of lower back and neck on April 27th. Pt is able to reduce symptoms with head tilt to the L.  Hand dominance: Right  PERTINENT HISTORY:  Asthma, chronic back pain, DM, GERD, HTN, hypothyroidism, Sleep apnea.   PAIN:  Are you having pain? Yes: NPRS scale: 4/10 Pain location: R upper extremity into hand.  Pain description: N/T Aggravating factors: Head in neutral position, sleeping positions, in the morning  Relieving factors: Tilting head to Lt , dry needling.  PRECAUTIONS: None   WEIGHT BEARING RESTRICTIONS: No   FALLS:  Has patient fallen in last 6 months? No   LIVING ENVIRONMENT: Lives with: lives with their family Lives in: House/apartment   OCCUPATION: retired Engineer, civil (consulting)    PLOF: Independent, Independent with basic ADLs, Independent with household mobility without device, Independent with community mobility without device, Independent with gait, and Independent with transfers   PATIENT GOALS: To eliminate neck pain and avoid further problems.    NEXT MD VISIT: as needed  OBJECTIVE:   DIAGNOSTIC FINDINGS:  None recently.   PATIENT SURVEYS:  FOTO 45.49%  COGNITION: Overall cognitive status: Within functional limits for tasks assessed  SENSATION: N/T into thumb and index finger.   POSTURE: rounded shoulders, forward head, and Head tilt to L.   PALPATION: Tenderness to R cervical paraspinals and into UT. Pt reports her index finger being very  itchy.    CERVICAL ROM:   Active  ROM A/PROM (deg) eval  Flexion 56  Extension 18 with numbness, tingling  Right lateral flexion 40  Left lateral flexion 33 with numbness, tingling and stiffness reported.  Right rotation 26 tight, numbness, tingling  Left rotation 45   (Blank rows = not tested)  UPPER EXTREMITY ROM:  Active ROM Right eval Left eval  Shoulder flexion [facility]  Shoulder abduction [facility]  Shoulder internal rotation [facility]  Shoulder external rotation WFL WFL   (Blank rows = not tested)  UPPER EXTREMITY MMT:  MMT Right eval Left eval  Shoulder flexion 4- 4-  Shoulder abduction 4- 4-  Shoulder internal rotation 4 4  Shoulder external rotation 4 4  Grip strength 50 53   (Blank rows = not tested)  CERVICAL SPECIAL TESTS:  Upper limb tension test (ULTT): Positive and Spurling's test: Positive  TODAY'S TREATMENT:   DATE: 10/19/2022 Discussion regarding symptoms and need to have symmetrical posture.  Discussed the purpose of each exercise to create space in the cervical spine.  Trigger Point Dry-Needling  Treatment instructions: Expect mild to moderate muscle soreness. S/S of pneumothorax if dry needled over a lung field, and to seek immediate medical attention should they occur. Patient verbalized understanding of these instructions and education.  Patient Consent Given: Yes Education handout provided: Previously provided Muscles treated: bil multifidi, bil upper traps and levator Elongation and release to Rt>Lt neck after DN.  Cervical mechanical traction: 15#/5# x 12 minutes 60 seconds/10 seconds   DATE: 10/16/2022 UBE 2 fwd/ 2 bkwd UT/ LS stretch 2x30 sec holds.  Chin tucks Nerve glides STM following TPDN to R UT, cervical paraspinals.   Trigger Point Dry-Needling  Treatment instructions: Expect mild to moderate muscle soreness. S/S of pneumothorax if dry needled over a lung field, and to seek immediate medical attention should they occur. Patient verbalized understanding of these  instructions and education.  Patient Consent Given: Yes Education handout provided: Previously provided Muscles treated: R posterior UT, cervical paraspinals, suboccipitals.  Electrical stimulation performed: No Parameters: N/A Treatment response/outcome: No adverse effects. Pt reports elimination of tingling symptoms.                                                                                                                             DATE: Creating, reviewing, and completing below HEP   PATIENT EDUCATION:  Education details: Educated pt on anatomy and physiology of current symptoms, FOTO, diagnosis, prognosis, HEP,  and POC. Person educated: Patient Education method: Medical illustrator Education comprehension: verbalized understanding and returned demonstration  HOME EXERCISE PROGRAM: Access Code: [email or web address] URL: [email or web address] Date: 10/15/2022 Prepared by: Royal Hawthorn  Exercises - Seated Scapular Retraction  - 2 x daily - 7 x weekly - 2 sets - 10 reps - Seated Upper Trapezius Stretch  - 2 x daily - 7 x weekly - 2 sets - 2 reps - 30 hold - Seated  Levator Scapulae Stretch  - 2 x daily - 7 x weekly - 2 sets - 2 reps - 30 hold - Putty Squeezes  - 2 x daily - 7 x weekly - 2 sets - 10 reps - Median Nerve Tensioner  - 2 x daily - 7 x weekly - 2 sets - 10 reps  ASSESSMENT:  CLINICAL IMPRESSION: Pt had good response to DN last session and reports compliance with HEP.  PT explained purpose of each exercise to pt and educated regarding importance of neutral posture as able.  Pt had relief of symptoms during pull phase of traction today.  Good response to DN with improved tissue mobility and reduced tension after manual therapy. Pt will continue to benefit from skilled PT to address continued deficits.    OBJECTIVE IMPAIRMENTS: decreased activity tolerance, decreased shoulder mobility, decreased ROM, decreased strength, impaired flexibility, impaired UE  use, postural dysfunction, and pain.  ACTIVITY LIMITATIONS: reaching, lifting, carry,  cleaning, driving, and or occupation  PERSONAL FACTORS:  also affecting patient's functional outcome.  REHAB POTENTIAL: Good  CLINICAL DECISION MAKING: Stable/uncomplicated  EVALUATION COMPLEXITY: Low    GOALS: Short term PT Goals Target date: 10/29/2022 Pt will be I and compliant with HEP. Baseline:  Goal status: New Pt will decrease pain by 25% overall Baseline: Goal status: New  Long term PT goals Target date: 12/03/2022  Pt will improve cervical shoulder AROM to [facility] to improve functional reaching Baseline: Goal status: New Pt will improve bilat shoulder strength to at least 4+/5 MMT to improve functional strength Baseline: Goal status: New Pt will improve FOTO to at least 57% functional to show improved function Baseline: Goal status: New Pt will reduce pain to overall less than <2/10 with usual activity and work activity. Baseline: Goal status: New Pt will be able to hold head in neutral without onset of symptoms.  Baseline: Goal status: New Pt will improve posture with min cues.  Baseline: Goal status: New  PLAN: PT FREQUENCY: 2x per week   PT DURATION: 6 weeks.   PLANNED INTERVENTIONS (unless contraindicated): aquatic PT, Canalith repositioning, cryotherapy, Electrical stimulation, Iontophoresis with 4 mg/ml dexamethasome, Moist heat, traction, Ultrasound, gait training, Therapeutic exercise, balance training, neuromuscular re-education, patient/family education, prosthetic training, manual techniques, passive ROM, dry needling, taping, vasopnuematic device, vestibular, spinal manipulations, joint manipulations  PLAN FOR NEXT SESSION: continue traction if helpful, DN 1x/wk, emphasize neutral postural alignment and strength    Lorrene Reid, PT 10/19/22 9:27 AM

## 2022-10-20 DIAGNOSIS — Z86010 Personal history of colonic polyps: Secondary | ICD-10-CM | POA: Diagnosis not present

## 2022-10-20 DIAGNOSIS — D128 Benign neoplasm of rectum: Secondary | ICD-10-CM | POA: Diagnosis not present

## 2022-10-20 DIAGNOSIS — K648 Other hemorrhoids: Secondary | ICD-10-CM | POA: Diagnosis not present

## 2022-10-20 DIAGNOSIS — D123 Benign neoplasm of transverse colon: Secondary | ICD-10-CM | POA: Diagnosis not present

## 2022-10-20 DIAGNOSIS — D122 Benign neoplasm of ascending colon: Secondary | ICD-10-CM | POA: Diagnosis not present

## 2022-10-20 DIAGNOSIS — K5730 Diverticulosis of large intestine without perforation or abscess without bleeding: Secondary | ICD-10-CM | POA: Diagnosis not present

## 2022-10-20 DIAGNOSIS — Z09 Encounter for follow-up examination after completed treatment for conditions other than malignant neoplasm: Secondary | ICD-10-CM | POA: Diagnosis not present

## 2022-10-22 DIAGNOSIS — D128 Benign neoplasm of rectum: Secondary | ICD-10-CM | POA: Diagnosis not present

## 2022-10-22 DIAGNOSIS — M1711 Unilateral primary osteoarthritis, right knee: Secondary | ICD-10-CM | POA: Diagnosis not present

## 2022-10-22 NOTE — Therapy (Signed)
OUTPATIENT PHYSICAL THERAPY CERVICAL TREATMENT   Patient Name: [name] MRN: [id number] DOB:[birth year], 72 y.o., female Today's Date: 10/22/2022  END OF SESSION:      Past Medical History:  Diagnosis Date   Asthma    has rescue inhaler, now on Flovent   Chronic back pain    Diabetes mellitus without complication    on medformin, on Ozempic   GERD (gastroesophageal reflux disease)    History of COVID-19 04/05/2021   Hyperlipemia    Hypertension    Hypothyroidism    Insomnia    melanoma 11/2021   vulva   OAB (overactive bladder)    Seasonal allergies    Sleep apnea    uses a cpap   Past Surgical History:  Procedure Laterality Date   ANKLE ARTHROTOMY  2003   left-fx   APPENDECTOMY     back fusion  02/14/2019   L4 back fusion   BACK SURGERY  2010   lumb disk   BREAST LUMPECTOMY WITH RADIOACTIVE SEED LOCALIZATION Left 06/13/2019   Procedure: LEFT BREAST LUMPECTOMY WITH RADIOACTIVE SEED LOCALIZATION;  Surgeon: Harriette Bouillon, MD;  Location: [facility];  Service: General;  Laterality: Left;   CARPAL TUNNEL RELEASE     rt   CARPAL TUNNEL RELEASE Left 08/01/2013   Procedure: LEFT CARPAL TUNNEL RELEASE;  Surgeon: Nicki Reaper, MD;  Location: [facility];  Service: Orthopedics;  Laterality: Left;   CESAREAN SECTION  83,86   COLONOSCOPY     DILATATION & CURETTAGE/HYSTEROSCOPY WITH MYOSURE N/A 08/23/2018   Procedure: DILATATION & CURETTAGE/HYSTEROSCOPY WITH MYOSURE POLYPECTOMY;  Surgeon: Patton Salles, MD;  Location: [facility];  Service: Gynecology;  Laterality: N/A;  fibroid resection   DILATION AND CURETTAGE OF UTERUS     x2   MASS EXCISION N/A 08/04/2017   Procedure: EXCISION LIPOMA UPPER BACK x2 AND NECK;  Surgeon: Harriette Bouillon, MD;  Location: [facility];  Service: General;  Laterality: N/A;   NASAL SEPTOPLASTY W/ TURBINOPLASTY Bilateral 06/21/2015   Procedure: NASAL SEPTOPLASTY WITH  BILATERAL TURBINATE REDUCTION;  Surgeon: Osborn Coho, MD;  [facility];  Service: ENT;  Laterality: Bilateral;   SINOSCOPY     TONSILLECTOMY     TUBAL LIGATION     VULVECTOMY N/A 12/24/2021   Procedure: WIDE EXCISION VULVECTOMY;  Surgeon: Carver Fila, MD;  Location: [facility];  Service: Gynecology;  Laterality: N/A;   Patient Active Problem List   Diagnosis Date Noted   Melanoma in situ 07/17/2022   Hypothyroidism 01/22/2022   Insomnia 01/22/2022   Lipoma 01/22/2022   Major depression, single episode 01/22/2022   Morbid obesity 01/22/2022   Overactive bladder 01/22/2022   Type 2 diabetes mellitus without complications 01/22/2022   Allergic rhinitis due to pollen 01/22/2022   Melanoma of vulva 08/30/2021   BMI 37.0-37.9, adult 08/30/2021   Spondylolisthesis of lumbosacral region 02/14/2019   Mild persistent asthma without complication 06/10/2018   Mixed rhinitis 06/10/2018   Obstructive sleep apnea 12/11/2016   Palpitations 06/17/2016   Deflected nasal septum 09/13/2015   Hypertrophy of nasal turbinates 09/13/2015   Deviated nasal septum 06/21/2015    Class: Chronic   HYPERLIPIDEMIA 11/18/2007   Essential hypertension 11/18/2007   Allergic rhinitis 11/18/2007   COUGH 11/18/2007    PCP: Camie Patience, FNP   REFERRING PROVIDER: William Hamburger, PA  REFERRING DIAG:  [phone] (ICD-10-CM) - Brachial neuritis  M54.16 (ICD-10-CM) - Lumbar radiculopathy  THERAPY DIAG:  No diagnosis found.  Rationale for Evaluation and Treatment: Rehabilitation  ONSET DATE: 3+ months    SUBJECTIVE:                                                                                                                                                                                                         SUBJECTIVE STATEMENT: Both the DN and traction have been beneficial, but the side effects do not last. I continue to have pain. They have me on steroids, but I  have not been able to sleep. Last night I slept a total of 2 hours.  I also just got news that I will need bilateral knee replacements as my knees are both bone on bone.   Pt states that she had an insidious onset of tingling in her R UE. She reports no know MOI. Pt to receive an MRI of lower back and neck on April 27th. Pt is able to reduce symptoms with head tilt to the L.  Hand dominance: Right  PERTINENT HISTORY:  Asthma, chronic back pain, DM, GERD, HTN, hypothyroidism, Sleep apnea.   PAIN:  Are you having pain? Yes: NPRS scale: 4/10 Pain location: R upper extremity into hand.  Pain description: N/T Aggravating factors: Head in neutral position, sleeping positions, in the morning  Relieving factors: Tilting head to Lt , dry needling.  PRECAUTIONS: None   WEIGHT BEARING RESTRICTIONS: No   FALLS:  Has patient fallen in last 6 months? No   LIVING ENVIRONMENT: Lives with: lives with their family Lives in: House/apartment   OCCUPATION: retired Engineer, civil (consulting)    PLOF: Independent, Independent with basic ADLs, Independent with household mobility without device, Independent with community mobility without device, Independent with gait, and Independent with transfers   PATIENT GOALS: To eliminate neck pain and avoid further problems.    NEXT MD VISIT: as needed  OBJECTIVE:   DIAGNOSTIC FINDINGS:  None recently.   PATIENT SURVEYS:  FOTO 45.49%  COGNITION: Overall cognitive status: Within functional limits for tasks assessed  SENSATION: N/T into thumb and index finger.   POSTURE: rounded shoulders, forward head, and Head tilt to L.   PALPATION: Tenderness to R cervical paraspinals and into UT. Pt reports her index finger being very itchy.    CERVICAL ROM:   Active ROM A/PROM (deg) eval  Flexion 56  Extension 18 with numbness, tingling  Right lateral flexion 40  Left lateral flexion 33 with numbness, tingling and stiffness reported.  Right rotation 26 tight, numbness,  tingling  Left rotation 45   (  Blank rows = not tested)  UPPER EXTREMITY ROM:  Active ROM Right eval Left eval  Shoulder flexion [facility]  Shoulder abduction [facility]  Shoulder internal rotation [facility]  Shoulder external rotation WFL WFL   (Blank rows = not tested)  UPPER EXTREMITY MMT:  MMT Right eval Left eval  Shoulder flexion 4- 4-  Shoulder abduction 4- 4-  Shoulder internal rotation 4 4  Shoulder external rotation 4 4  Grip strength 50 53   (Blank rows = not tested)  CERVICAL SPECIAL TESTS:  Upper limb tension test (ULTT): Positive and Spurling's test: Positive  TODAY'S TREATMENT:   DATE: 10/23/2022 UBE 2 fwd/ 2 bkwd Chin tucks Seated scap retraction  Discussion regarding symptoms and importance of exercises. Anatomy of neck and decreased lordosis present.  Cervical mechanical traction: 15#/5# x 15 minutes 60 seconds/10 seconds  DATE: 10/19/2022 Discussion regarding symptoms and need to have symmetrical posture.  Discussed the purpose of each exercise to create space in the cervical spine.  Trigger Point Dry-Needling  Treatment instructions: Expect mild to moderate muscle soreness. S/S of pneumothorax if dry needled over a lung field, and to seek immediate medical attention should they occur. Patient verbalized understanding of these instructions and education.  Patient Consent Given: Yes Education handout provided: Previously provided Muscles treated: bil multifidi, bil upper traps and levator Elongation and release to Rt>Lt neck after DN.  Cervical mechanical traction: 15#/5# x 12 minutes 60 seconds/10 seconds   DATE: 10/16/2022 UBE 2 fwd/ 2 bkwd UT/ LS stretch 2x30 sec holds.  Chin tucks Nerve glides STM following TPDN to R UT, cervical paraspinals.   Trigger Point Dry-Needling  Treatment instructions: Expect mild to moderate muscle soreness. S/S of pneumothorax if dry needled over a lung field, and to seek immediate medical attention should they occur.  Patient verbalized understanding of these instructions and education.  Patient Consent Given: Yes Education handout provided: Previously provided Muscles treated: R posterior UT, cervical paraspinals, suboccipitals.  Electrical stimulation performed: No Parameters: N/A Treatment response/outcome: No adverse effects. Pt reports elimination of tingling symptoms.                                                                                                                             DATE: Creating, reviewing, and completing below HEP   PATIENT EDUCATION:  Education details: Educated pt on anatomy and physiology of current symptoms, FOTO, diagnosis, prognosis, HEP,  and POC. Person educated: Patient Education method: Medical illustrator Education comprehension: verbalized understanding and returned demonstration  HOME EXERCISE PROGRAM: Access Code: [email or web address] URL: [email or web address] Date: 10/15/2022 Prepared by: Royal Hawthorn  Exercises - Seated Scapular Retraction  - 2 x daily - 7 x weekly - 2 sets - 10 reps - Seated Upper Trapezius Stretch  - 2 x daily - 7 x weekly - 2 sets - 2 reps - 30 hold - Seated Levator Scapulae Stretch  - 2 x daily - 7 x weekly -  2 sets - 2 reps - 30 hold - Putty Squeezes  - 2 x daily - 7 x weekly - 2 sets - 10 reps - Median Nerve Tensioner  - 2 x daily - 7 x weekly - 2 sets - 10 reps  ASSESSMENT:  CLINICAL IMPRESSION: Pt had good response to traction last session, but reports minimal carryover. She is requesting continued use of traction today with good response. Pt had relief of symptoms during pull phase of traction today.  Continued with chin tucks following traction today. Discussed anatomy and limited lordosis of cervical spine and how it can be affecting posture and symptoms. Pt would like to increase frequency to 3x per week per referral. Pt will continue to benefit from skilled PT to address continued deficits.    OBJECTIVE  IMPAIRMENTS: decreased activity tolerance, decreased shoulder mobility, decreased ROM, decreased strength, impaired flexibility, impaired UE use, postural dysfunction, and pain.  ACTIVITY LIMITATIONS: reaching, lifting, carry,  cleaning, driving, and or occupation  PERSONAL FACTORS:  also affecting patient's functional outcome.  REHAB POTENTIAL: Good  CLINICAL DECISION MAKING: Stable/uncomplicated  EVALUATION COMPLEXITY: Low    GOALS: Short term PT Goals Target date: 10/29/2022 Pt will be I and compliant with HEP. Baseline:  Goal status: New Pt will decrease pain by 25% overall Baseline: Goal status: New  Long term PT goals Target date: 12/03/2022  Pt will improve cervical shoulder AROM to [facility] to improve functional reaching Baseline: Goal status: New Pt will improve bilat shoulder strength to at least 4+/5 MMT to improve functional strength Baseline: Goal status: New Pt will improve FOTO to at least 57% functional to show improved function Baseline: Goal status: New Pt will reduce pain to overall less than <2/10 with usual activity and work activity. Baseline: Goal status: New Pt will be able to hold head in neutral without onset of symptoms.  Baseline: Goal status: New Pt will improve posture with min cues.  Baseline: Goal status: New  PLAN: PT FREQUENCY: 2x per week   PT DURATION: 6 weeks.   PLANNED INTERVENTIONS (unless contraindicated): aquatic PT, Canalith repositioning, cryotherapy, Electrical stimulation, Iontophoresis with 4 mg/ml dexamethasome, Moist heat, traction, Ultrasound, gait training, Therapeutic exercise, balance training, neuromuscular re-education, patient/family education, prosthetic training, manual techniques, passive ROM, dry needling, taping, vasopnuematic device, vestibular, spinal manipulations, joint manipulations  PLAN FOR NEXT SESSION: continue traction if helpful, DN 1x/wk, emphasize neutral postural alignment and strength   Royal Hawthorn PT, DPT 10/22/22  2:23 PM

## 2022-10-22 NOTE — Progress Notes (Signed)
Gynecologic Oncology Return Clinic Visit  10/23/22  Reason for Visit: surveillance  Treatment History: 08/12/21: Vulvar biopsy revealed vulvar melanoma   09/12/21: Mild hypermetabolism in the lower vulva likely related to recent biopsy/surgery.  No discrete hypermetabolic mass.  No adenopathy.   11/04/2021: Left vulvar wide local excision and left inguinal sentinel lymph node biopsy with Surgical Oncology. Operative Findings: 1 cm melanotic lesion on medial aspect of anterior left labia minora. 1 sentinel lymph node identified in the subcutaneous fat superficial to the external oblique.  Pathology:  Diagnosis  A: Sentinel lymph node, left inguinal, removal - Negative for melanoma in one lymph node (0/1). B: Vulva, excision - Malignant melanoma. Melanoma in situ extends broadly to 11-12-10 o'clock peripheral margin and focally to the 3 o'clock peripheral margin. Margins are negative for invasive melanoma. Type: Vulvar Clark Level: IV Breslow thickness: 1.8 mm Growth phase: Vertical, spindled melanocytes with light pigment Mitoses: per square millimeter Nuclear grade: 2/3 Tumor infiltrating lymphocytes:  Regression: Not identified Ulceration: Not identified  Microscopic satellite: Not identified Vascular invasion: Not identified Perineural invasion: Not identified Associated nevus: Not identified  Margins: Melanoma in situ extends broadly to 11-12-10 o'clock peripheral margin (B3-B5) and focally to the 3 o'clock peripheral margin (B3). Margins are negative for invasive melanoma Pathologic (pT, AJCC 8th edition) staging: pT2a C: Vulva, deep margin, excision - Connective tissue.  - Negative for melanoma   11/19/2021 Tumor Board recommendations.  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 Plan: Repeat excision of vulvar melanoma in situ  - Medical Oncology referral  for consideration of additional treatment (observation vs pembro vs locoregional radiation), scheduled 5/22 Medical oncology recommendations: CT C/A/P q 3 months for 1 year then q 6 months until year 3, then annual. Consider imiquimod for the in situ component if the margins on reexcision were to show melanoma in situ. Obtain full molecular profile with Tempus testing - testing showed no targetable mutations or obvious germline mutation.   12/24/2021: Partial simple left anterior vulvectomy given margins positive for vulvar melanoma in situ.  Prior scar identified, no definitive evidence visually of melanoma lesion.  mL in situ noted involving all margins.  No residual invasive disease noted.   02/2022: CT chest, abdomen, pelvis shows multiple bilateral pulmonary nodules including previously described 5 mm left lower lobe nodule.  There is suggestion of mild increase in size of 8 mm left upper lobe nodule.  Nonspecific, recommended close follow-up.  Interval postsurgical changes in the left inguinal region.  Too small to characterize 9 mm low-attenuation lesion in the left hepatic lobe, recommended attention on follow-up.  05/17/22: CT C/A/P:  1. Interval increase in size of prominent left external iliac and inguinal lymph nodes including 1 directly adjacent to the left inguinal surgical clips, nonspecific and possibly reactive but suspicious for nodal metastatic disease.  2. Stable nonspecific bilateral pulmonary nodules, continued attention on follow-up imaging suggested. 3. Stable 9 mm hypodensity in the left hepatic lobe, technically too small to accurately characterize but distinctly not typical in appearance for that of a melanoma metastasis favored to reflect a cyst or hemangioma. Suggest continued attention on follow-up imaging. 4. Increased prominence of the left gonadal vein and pelvic collateral vessels, which can be seen in the setting of pelvic venous insufficiency. 5. Hepatic  steatosis. 6. Cholelithiasis without findings of acute cholecystitis. 7.  Aortic Atherosclerosis (ICD10-I70.0).  07/17/22: Vulvar and peri-clitoral biopsies.  A. VULVA, LEFT LATERAL, 1:00, BIOPSY: NO EVIDENCE OF MELANOMA B. VULVA, LEFT LATERAL, 2:00, BIOPSY: ATYPICAL LENTIGINOUS MELANOCYTIC PROLIFERATION, SEE COMMENT C. VULVA, LEFT LATERAL, 3:00, BIOPSY: ATYPICAL LENTIGINOUS MELANOCYTIC PROLIFERATION, SEE COMMENT D. VULVA, LEFT, SUPERIOR CLITORAL FOLD, BIOPSY: SCATTERED JUNCTIONAL MELANOCYTES, NOT DIAGNOSTIC OF MALIGNANCY, SEE COMMENT E. VULVA, LEFT LATERAL, CLITORIS, BIOPSY: NO EVIDENCE OF MELANOMA F. VULVA, LEFT MEDIAL, 3:00, BIOPSY: NO EVIDENCE OF MELANOMA G. VULVA, LEFT CENTRAL, BIOPSY: NO EVIDENCE OF MELANOMA COMMENT: Numerous levels of each biopsy were evaluated by H and E as well as MelanA stain. Negative biopsies are noted above. In biopsies at 2 and 3 o'clock, there is an increase in junctional melanocytes, but without upward growth nor nest formation. This is an atypical lentiginous melanocytic proliferation. It may represent partially-treated previous melanoma but is not diagnostic for such. The superior clitoral fold exhibits scattered junctional melanocytes, which are evenly spaced and not diagnostic of malignancy. Followup and/or additional biopsies are recommended. Drs. Maryelizabeth Kaufmann and Juel Burrow have a similar opinion.   08/14/22: CT C/A/P: 1. No convincing evidence of new or progressive metastatic disease within the chest, abdomen or pelvis. 2. Low-density filling defect in the left upper lobe lateral segmental bronchus with corresponding atelectasis and new prominent left hilar nodal tissue, nonspecific findings which are favored to reflect aspiration/mucous in the airway with reactive hilar nodal tissue. However while felt less likely a metastatic endobronchial lesion is not entirely excluded and warrants further evaluation with short-term interval follow-up chest CT versus bronchoscopy. 3.  Stable bilateral pulmonary nodules, favored benign. Continued attention on follow-up imaging suggested. 4. Cholelithiasis without findings of acute cholecystitis. 5.  Aortic Atherosclerosis (ICD10-I70.0).  Interval History: Doing well.  Denies any vulvar symptoms recently.  Had some mild itching although not within the last several weeks.  Denies any bleeding, discharge, or vulvar pain.  Reports baseline bowel bladder function.  Is currently on some prednisone for neck and knee related arthritis pain.  Had a colonoscopy earlier this week.  Past Medical/Surgical History: Past Medical History:  Diagnosis Date   Asthma    has rescue inhaler, now on Flovent   Chronic back pain    Diabetes mellitus without complication    on medformin, on Ozempic   GERD (gastroesophageal reflux disease)    History of COVID-19 04/05/2021   Hyperlipemia    Hypertension    Hypothyroidism    Insomnia    melanoma 11/2021   vulva   OAB (overactive bladder)    Seasonal allergies    Sleep apnea    uses a cpap    Past Surgical History:  Procedure Laterality Date   ANKLE ARTHROTOMY  2003   left-fx   APPENDECTOMY     back fusion  02/14/2019   L4 back fusion   BACK SURGERY  2010   lumb disk   BREAST LUMPECTOMY WITH RADIOACTIVE SEED LOCALIZATION Left 06/13/2019   Procedure: LEFT BREAST LUMPECTOMY WITH RADIOACTIVE SEED LOCALIZATION;  Surgeon: Harriette Bouillon, MD;  Location: [facility];  Service: General;  Laterality: Left;   CARPAL TUNNEL RELEASE     rt   CARPAL TUNNEL RELEASE Left 08/01/2013   Procedure: LEFT CARPAL TUNNEL RELEASE;  Surgeon: Nicki Reaper, MD;  Location:  SURGERY CENTER;  Service: Orthopedics;  Laterality: Left;   CESAREAN SECTION  83,86   COLONOSCOPY     DILATATION & CURETTAGE/HYSTEROSCOPY WITH MYOSURE N/A 08/23/2018   Procedure: DILATATION & CURETTAGE/HYSTEROSCOPY WITH MYOSURE POLYPECTOMY;  Surgeon: Patton Salles, MD;  Location:  [facility];  Service: Gynecology;  Laterality: N/A;  fibroid resection   DILATION AND CURETTAGE OF UTERUS     x2   MASS EXCISION N/A 08/04/2017   Procedure: EXCISION LIPOMA UPPER BACK x2 AND NECK;  Surgeon: Harriette Bouillon, MD;  Location: [facility];  Service: General;  Laterality: N/A;   NASAL SEPTOPLASTY W/ TURBINOPLASTY Bilateral 06/21/2015   Procedure: NASAL SEPTOPLASTY WITH BILATERAL TURBINATE REDUCTION;  Surgeon: Osborn Coho, MD;  Location: [facility] OR;  Service: ENT;  Laterality: Bilateral;   SINOSCOPY     TONSILLECTOMY     TUBAL LIGATION     VULVECTOMY N/A 12/24/2021   Procedure: WIDE EXCISION VULVECTOMY;  Surgeon: Carver Fila, MD;  Location: [facility];  Service: Gynecology;  Laterality: N/A;    Family History  Problem Relation Age of Onset   Allergic rhinitis Father    Allergic rhinitis Sister    Asthma Sister    Endometrial cancer Sister 27   Multiple myeloma Sister    Cancer Maternal Grandmother    Asthma Paternal Aunt    Angioedema Neg Hx    Eczema Neg Hx    Immunodeficiency Neg Hx    Urticaria Neg Hx    Ovarian cancer Neg Hx    Breast cancer Neg Hx    Colon cancer Neg Hx    Prostate cancer Neg Hx    Pancreatic cancer Neg Hx     Social History   Socioeconomic History   Marital status: Married    Spouse name: Not on file   Number of children: Not on file   Years of education: Not on file   Highest education level: Not on file  Occupational History   Occupation: retored  Tobacco Use   Smoking status: Never   Smokeless tobacco: Never  Vaping Use   Vaping Use: Never used  Substance and Sexual Activity   Alcohol use: Yes    Comment: 2 glasses of wine/month   Drug use: No   Sexual activity: Yes    Birth control/protection: Post-menopausal  Other Topics Concern   Not on file  Social History Narrative   Not on file   Social Determinants of Health   Financial Resource Strain: Not on file  Food Insecurity: No Food  Insecurity (08/05/2022)   Hunger Vital Sign    Worried About Running Out of Food in the Last Year: Never true    Ran Out of Food in the Last Year: Never true  Transportation Needs: No Transportation Needs (08/05/2022)   PRAPARE - Administrator, Civil Service (Medical): No    Lack of Transportation (Non-Medical): No  Physical Activity: Not on file  Stress: Not on file  Social Connections: Not on file    Current Medications:  Current Outpatient Medications:    5-Hydroxytryptophan (5-HTP PO), Take 1 tablet by mouth daily., Disp: , Rfl:    acetaminophen (TYLENOL) 500 MG tablet, Take 500 mg by mouth 2 (two) times daily., Disp: , Rfl:    albuterol (VENTOLIN HFA) 108 (90 Base) MCG/ACT inhaler, Inhale 2 puffs into the lungs every 4 (four) hours as needed for wheezing or shortness of breath., Disp: , Rfl:    amLODipine (NORVASC) 5 MG tablet, Take 5 mg by mouth daily., Disp: , Rfl:    azelastine (ASTELIN) 0.1 % nasal spray, Place 2 sprays into both nostrils 2 (two) times daily. Use in each nostril as directed, Disp: 30 mL, Rfl: 5   baclofen (  LIORESAL) 20 MG tablet, Take 20 mg by mouth See admin instructions. Take 20 mg daily, may take up to 3 times daily as needed for spasms, Disp: , Rfl:    CINNAMON PO, Take 1,000 mg by mouth in the morning and at bedtime., Disp: , Rfl:    diclofenac Sodium (VOLTAREN) 1 % GEL, Apply 2 g topically 4 (four) times daily as needed (pain)., Disp: , Rfl:    famotidine-calcium carbonate-magnesium hydroxide (PEPCID COMPLETE) 10-800-165 MG chewable tablet, Chew 1 tablet by mouth every evening., Disp: , Rfl:    fluticasone (FLONASE) 50 MCG/ACT nasal spray, Place 1 spray into both nostrils daily., Disp: , Rfl:    fluticasone (FLOVENT HFA) 44 MCG/ACT inhaler, TAKE 1 PUFF BY MOUTH EVERY DAY, Disp: 10.6 each, Rfl: 5   gabapentin (NEURONTIN) 400 MG capsule, Take 400-800 mg by mouth See admin instructions. Take 400 mg in the morning and 800 mg at night, Disp: , Rfl:     GLUCOSAMINE-CHONDROITIN PO, Take 2 tablets by mouth daily., Disp: , Rfl:    glucose blood test strip, as directed finger stick once a day for 90 days, Disp: , Rfl:    hydrochlorothiazide (MICROZIDE) 12.5 MG capsule, Take 12.5 mg by mouth daily., Disp: , Rfl:    levocetirizine (XYZAL) 5 MG tablet, TAKE 1 TABLET BY MOUTH EVERY EVENING, Disp: 30 tablet, Rfl: 5   levothyroxine (SYNTHROID) 112 MCG tablet, Take 112 mcg by mouth daily before breakfast., Disp: , Rfl:    losartan (COZAAR) 100 MG tablet, Take 100 mg by mouth daily., Disp: , Rfl:    Melatonin 3 MG TABS, Take 3 mg by mouth at bedtime., Disp: , Rfl:    meloxicam (MOBIC) 7.5 MG tablet, Take 7.5 mg by mouth 2 (two) times daily as needed for pain., Disp: , Rfl:    montelukast (SINGULAIR) 10 MG tablet, TAKE 1 TABLET BY MOUTH EVERYDAY AT BEDTIME, Disp: 30 tablet, Rfl: 5   Multiple Vitamin (MULTIVITAMIN WITH MINERALS) TABS tablet, Take 1 tablet by mouth at bedtime., Disp: , Rfl:    MYRBETRIQ 50 MG TB24 tablet, Take 50 mg by mouth daily., Disp: , Rfl:    predniSONE (STERAPRED UNI-PAK 21 TAB) 5 MG (21) TBPK tablet, TAKE 6 TABLETS ON DAY 1 AS DIRECTED ON PACKAGE AND DECREASE BY 1 TAB EACH DAY FOR A TOTAL OF 6 DAYS, Disp: , Rfl:    Semaglutide (OZEMPIC, 2 MG/DOSE, [place]), Inject into the skin., Disp: , Rfl:    sertraline (ZOLOFT) 50 MG tablet, Take 50 mg by mouth daily., Disp: , Rfl:    simvastatin (ZOCOR) 20 MG tablet, Take 20 mg by mouth at bedtime. , Disp: , Rfl:    zolpidem (AMBIEN) 10 MG tablet, Take 5 mg by mouth at bedtime as needed for sleep. , Disp: , Rfl:   Review of Systems: Denies appetite changes, fevers, chills, fatigue, unexplained weight changes. Denies hearing loss, neck lumps or masses, mouth sores, ringing in ears or voice changes. Denies cough or wheezing.  Denies shortness of breath. Denies chest pain or palpitations. Denies leg swelling. Denies abdominal distention, pain, blood in stools, constipation, diarrhea, nausea, vomiting,  or early satiety. Denies pain with intercourse, dysuria, frequency, hematuria or incontinence. Denies hot flashes, pelvic pain, vaginal bleeding or vaginal discharge.   Denies joint pain, back pain or muscle pain/cramps. Denies itching, rash, or wounds. Denies dizziness, headaches, numbness or seizures. Denies swollen lymph nodes or glands, denies easy bruising or bleeding. Denies anxiety, depression, confusion, or decreased concentration.  Physical Exam: BP (!) 150/67 (BP Location: Left Arm, Patient Position: Sitting)   Pulse 75   Temp 97.9 F (36.6 C) (Oral)   Wt 233 lb (105.7 kg)   LMP 07/06/1998 (Approximate)   SpO2 96%   BMI 37.61 kg/m  General: Alert, oriented, no acute distress. HEENT: Normocephalic, atraumatic, sclera anicteric. Chest: Unlabored breathing on room air. Lymphatics: No palpable cervical, supraclavicular, or inguinal adenopathy. GU: Normal-appearing external female genitalia with several areas with slightly hyperpigmented skin in prior biopsy distribution.  No atypical vascularity.  Vulvar biopsy Preoperative diagnosis: history of MIS, melanoma Postoperative diagnosis: Same as above Physician: Pricilla Holm MD Estimated blood loss: Minimal Specimens: Lower biopsies Procedure: After the procedure was discussed with the patient including risks and benefits, she gave verbal consent.  She was then placed in dorsolithotomy position.  Vulva was inspected with picture from last biopsies in the OR.  2 of the locations from the 3 mildly abnormal biopsies were selected for biopsy, 1 just lateral to the superior aspect of the clitoris on the left and 1 at 2:00 along the left vulva.  Vulva was cleansed with Betadine x 3.  2 cc of 2% lidocaine was injected for local anesthesia at the biopsy sites.  3 mm punch biopsies were taken and placed in formalin.  Silver nitrate was used to achieve hemostasis.  Overall the patient tolerated the procedure well.  All instruments were removed from  the vagina.  Laboratory & Radiologic Studies: None new  Assessment & Plan: [name] is a 72 y.o. woman with Stage IB vulvar melanoma who underwent repeat excision in an attempt to achieve negative margins given MIS present. Unfortunately, margins still positive for melanoma in situ.  Patient was treated with Aldara with posttreatment biopsies showing no definite melanoma in situ or melanoma.  Given some findings on biopsy, plan had been for repeat biopsy in 3 months in these areas.  She is overall doing well without evidence of disease on exam today.  2 biopsies were taken along the areas that had mild abnormality on last biopsy in the operating room.  I will call her with these results next week.  Given last CT findings, plan for additional CT of the chest, abdomen, and pelvis 3 months after her scan in February.  This will be scheduled for May.  I will see her back for surveillance follow-up in 3 months.  I reviewed signs and symptoms that should prompt a phone call before her next scheduled visit.  22 minutes of total time was spent for this patient encounter, including preparation, face-to-face counseling with the patient and coordination of care, and documentation of the encounter.  Eugene Garnet, MD  Division of Gynecologic Oncology  Department of Obstetrics and Gynecology  [facility]

## 2022-10-23 ENCOUNTER — Inpatient Hospital Stay: Payer: PPO | Attending: Gynecologic Oncology | Admitting: Gynecologic Oncology

## 2022-10-23 ENCOUNTER — Encounter: Payer: Self-pay | Admitting: Gynecologic Oncology

## 2022-10-23 ENCOUNTER — Encounter: Payer: Self-pay | Admitting: Physical Therapy

## 2022-10-23 ENCOUNTER — Ambulatory Visit: Payer: PPO | Admitting: Physical Therapy

## 2022-10-23 VITALS — BP 150/67 | HR 75 | Temp 97.9°F | Wt 233.0 lb

## 2022-10-23 DIAGNOSIS — Z9221 Personal history of antineoplastic chemotherapy: Secondary | ICD-10-CM | POA: Insufficient documentation

## 2022-10-23 DIAGNOSIS — R293 Abnormal posture: Secondary | ICD-10-CM | POA: Diagnosis not present

## 2022-10-23 DIAGNOSIS — M6281 Muscle weakness (generalized): Secondary | ICD-10-CM

## 2022-10-23 DIAGNOSIS — C519 Malignant neoplasm of vulva, unspecified: Secondary | ICD-10-CM

## 2022-10-23 DIAGNOSIS — Z85820 Personal history of malignant melanoma of skin: Secondary | ICD-10-CM | POA: Diagnosis not present

## 2022-10-23 DIAGNOSIS — Z9889 Other specified postprocedural states: Secondary | ICD-10-CM | POA: Insufficient documentation

## 2022-10-23 DIAGNOSIS — M5412 Radiculopathy, cervical region: Secondary | ICD-10-CM

## 2022-10-23 DIAGNOSIS — D039 Melanoma in situ, unspecified: Secondary | ICD-10-CM

## 2022-10-23 NOTE — Patient Instructions (Signed)
It was good to see you today.  I do not see or feel any evidence of cancer recurrence on your exam.  I will let you know once I have biopsy results back from today, likely early next week.  We have scheduled you for repeat CT imaging 3 months after your last scan, planned for mid May.  I will see you for follow-up in 3 months.  As always, if you develop any new and concerning symptoms before your next visit, please call to see me sooner.

## 2022-10-23 NOTE — Addendum Note (Signed)
Addended by: Carver Fila on: 10/23/2022 04:09 PM   Modules accepted: Orders

## 2022-10-26 ENCOUNTER — Ambulatory Visit: Payer: PPO

## 2022-10-26 DIAGNOSIS — M6281 Muscle weakness (generalized): Secondary | ICD-10-CM

## 2022-10-26 DIAGNOSIS — R293 Abnormal posture: Secondary | ICD-10-CM | POA: Diagnosis not present

## 2022-10-26 DIAGNOSIS — M5412 Radiculopathy, cervical region: Secondary | ICD-10-CM

## 2022-10-26 DIAGNOSIS — R252 Cramp and spasm: Secondary | ICD-10-CM

## 2022-10-26 NOTE — Therapy (Signed)
OUTPATIENT PHYSICAL THERAPY CERVICAL TREATMENT   Patient Name: [name] MRN: [id number] DOB:Apr 15, 1951, 72 y.o., female Today's Date: 10/26/2022  END OF SESSION:  PT End of Session - 10/26/22 0923     Visit Number 5    Date for PT Re-Evaluation 12/03/22    Authorization Type [employer]    Progress Note Due on Visit 10    PT Start Time 0845    PT Stop Time 0929    PT Time Calculation (min) 44 min    Activity Tolerance Patient tolerated treatment well    Behavior During Therapy [facility] for tasks assessed/performed                Past Medical History:  Diagnosis Date   Asthma    has rescue inhaler, now on Flovent   Chronic back pain    Diabetes mellitus without complication    on medformin, on Ozempic   GERD (gastroesophageal reflux disease)    History of COVID-19 04/05/2021   Hyperlipemia    Hypertension    Hypothyroidism    Insomnia    melanoma 11/2021   vulva   OAB (overactive bladder)    Seasonal allergies    Sleep apnea    uses a cpap   Past Surgical History:  Procedure Laterality Date   ANKLE ARTHROTOMY  2003   left-fx   APPENDECTOMY     back fusion  02/14/2019   L4 back fusion   BACK SURGERY  2010   lumb disk   BREAST LUMPECTOMY WITH RADIOACTIVE SEED LOCALIZATION Left 06/13/2019   Procedure: LEFT BREAST LUMPECTOMY WITH RADIOACTIVE SEED LOCALIZATION;  Surgeon: Harriette Bouillon, MD;  Location: [facility];  Service: General;  Laterality: Left;   CARPAL TUNNEL RELEASE     rt   CARPAL TUNNEL RELEASE Left 08/01/2013   Procedure: LEFT CARPAL TUNNEL RELEASE;  Surgeon: Nicki Reaper, MD;  Location: [facility];  Service: Orthopedics;  Laterality: Left;   CESAREAN SECTION  83,86   COLONOSCOPY     DILATATION & CURETTAGE/HYSTEROSCOPY WITH MYOSURE N/A 08/23/2018   Procedure: DILATATION & CURETTAGE/HYSTEROSCOPY WITH MYOSURE POLYPECTOMY;  Surgeon: Patton Salles, MD;  Location: [facility];  Service:  Gynecology;  Laterality: N/A;  fibroid resection   DILATION AND CURETTAGE OF UTERUS     x2   MASS EXCISION N/A 08/04/2017   Procedure: EXCISION LIPOMA UPPER BACK x2 AND NECK;  Surgeon: Harriette Bouillon, MD;  Location: [facility];  Service: General;  Laterality: N/A;   NASAL SEPTOPLASTY W/ TURBINOPLASTY Bilateral 06/21/2015   Procedure: NASAL SEPTOPLASTY WITH BILATERAL TURBINATE REDUCTION;  Surgeon: Osborn Coho, MD;  Location: [facility];  Service: ENT;  Laterality: Bilateral;   SINOSCOPY     TONSILLECTOMY     TUBAL LIGATION     VULVECTOMY N/A 12/24/2021   Procedure: WIDE EXCISION VULVECTOMY;  Surgeon: Carver Fila, MD;  [facility];  Service: Gynecology;  Laterality: N/A;   Patient Active Problem List   Diagnosis Date Noted   Melanoma in situ 07/17/2022   Hypothyroidism 01/22/2022   Insomnia 01/22/2022   Lipoma 01/22/2022   Major depression, single episode 01/22/2022   Morbid obesity 01/22/2022   Overactive bladder 01/22/2022   Type 2 diabetes mellitus without complications 01/22/2022   Allergic rhinitis due to pollen 01/22/2022   Melanoma of vulva 08/30/2021   BMI 37.0-37.9, adult 08/30/2021   Spondylolisthesis of lumbosacral region 02/14/2019   Mild persistent asthma  without complication 06/10/2018   Mixed rhinitis 06/10/2018   Obstructive sleep apnea 12/11/2016   Palpitations 06/17/2016   Deflected nasal septum 09/13/2015   Hypertrophy of nasal turbinates 09/13/2015   Deviated nasal septum 06/21/2015    Class: Chronic   HYPERLIPIDEMIA 11/18/2007   Essential hypertension 11/18/2007   Allergic rhinitis 11/18/2007   COUGH 11/18/2007    PCP: Camie Patience, FNP   REFERRING PROVIDER: William Hamburger, PA  REFERRING DIAG:  [id number] (ICD-10-CM) - Brachial neuritis  M54.16 (ICD-10-CM) - Lumbar radiculopathy    THERAPY DIAG:  Abnormal posture  Muscle weakness (generalized)  Radiculopathy, cervical region  Cramp and  spasm  Rationale for Evaluation and Treatment: Rehabilitation  ONSET DATE: 3+ months    SUBJECTIVE:                                                                                                                                                                                                         SUBJECTIVE STATEMENT: I got an injection in the Rt knee and it helped.  My neck and arm are not great.  I had a very busy week last week with appts. I tried to rest yesterday due to recovery from procedures.    Pt states that she had an insidious onset of tingling in her R UE. She reports no know MOI. Pt to receive an MRI of lower back and neck on April 27th. Pt is able to reduce symptoms with head tilt to the L.  Hand dominance: Right  PERTINENT HISTORY:  Asthma, chronic back pain, DM, GERD, HTN, hypothyroidism, Sleep apnea.   PAIN:  Are you having pain? Yes: NPRS scale: 5-6/10 Pain location: R upper extremity into hand.  Pain description: N/T Aggravating factors: Head in neutral position, sleeping positions, in the morning  Relieving factors: Tilting head to Lt , dry needling.  PRECAUTIONS: None   WEIGHT BEARING RESTRICTIONS: No   FALLS:  Has patient fallen in last 6 months? No   LIVING ENVIRONMENT: Lives with: lives with their family Lives in: House/apartment   OCCUPATION: retired Engineer, civil (consulting)    PLOF: Independent, Independent with basic ADLs, Independent with household mobility without device, Independent with community mobility without device, Independent with gait, and Independent with transfers   PATIENT GOALS: To eliminate neck pain and avoid further problems.    NEXT MD VISIT: as needed  OBJECTIVE:   DIAGNOSTIC FINDINGS:  None recently.   PATIENT SURVEYS:  FOTO 45.49%  COGNITION: Overall cognitive status: Within functional limits for tasks assessed  SENSATION: N/T into thumb and index finger.  POSTURE: rounded shoulders, forward head, and Head tilt to L.    PALPATION: Tenderness to R cervical paraspinals and into UT. Pt reports her index finger being very itchy.    CERVICAL ROM:   Active ROM A/PROM (deg) eval  Flexion 56  Extension 18 with numbness, tingling  Right lateral flexion 40  Left lateral flexion 33 with numbness, tingling and stiffness reported.  Right rotation 26 tight, numbness, tingling  Left rotation 45   (Blank rows = not tested)  UPPER EXTREMITY ROM:  Active ROM Right eval Left eval  Shoulder flexion [facility]  Shoulder abduction [facility]  Shoulder internal rotation [facility]  Shoulder external rotation WFL WFL   (Blank rows = not tested)  UPPER EXTREMITY MMT:  MMT Right eval Left eval  Shoulder flexion 4- 4-  Shoulder abduction 4- 4-  Shoulder internal rotation 4 4  Shoulder external rotation 4 4  Grip strength 50 53   (Blank rows = not tested)  CERVICAL SPECIAL TESTS:  Upper limb tension test (ULTT): Positive and Spurling's test: Positive  TODAY'S TREATMENT:   DATE: 10/26/2022 Discussed HEP and purpose of each exercise Trigger Point Dry-Needling  Treatment instructions: Expect mild to moderate muscle soreness. S/S of pneumothorax if dry needled over a lung field, and to seek immediate medical attention should they occur. Patient verbalized understanding of these instructions and education.  Patient Consent Given: Yes Education handout provided: Previously provided Muscles treated: bil multifidi, bil upper traps and levator Elongation and release to Rt>Lt neck after DN.  Cervical mechanical traction: 15#/5# x 12 minutes 60 seconds/10 seconds   DATE: 10/23/2022 UBE 2 fwd/ 2 bkwd Chin tucks Seated scap retraction  Discussion regarding symptoms and importance of exercises. Anatomy of neck and decreased lordosis present.  Cervical mechanical traction: 15#/5# x 15 minutes 60 seconds/10 seconds  DATE: 10/19/2022 Discussion regarding symptoms and need to have symmetrical posture.  Discussed the  purpose of each exercise to create space in the cervical spine.  Trigger Point Dry-Needling  Treatment instructions: Expect mild to moderate muscle soreness. S/S of pneumothorax if dry needled over a lung field, and to seek immediate medical attention should they occur. Patient verbalized understanding of these instructions and education.  Patient Consent Given: Yes Education handout provided: Previously provided Muscles treated: bil multifidi, bil upper traps and levator Elongation and release to Rt>Lt neck after DN.  Cervical mechanical traction: 15#/5# x 12 minutes 60 seconds/10 seconds   DATE: 10/16/2022 UBE 2 fwd/ 2 bkwd UT/ LS stretch 2x30 sec holds.  Chin tucks Nerve glides STM following TPDN to R UT, cervical paraspinals.   Trigger Point Dry-Needling  Treatment instructions: Expect mild to moderate muscle soreness. S/S of pneumothorax if dry needled over a lung field, and to seek immediate medical attention should they occur. Patient verbalized understanding of these instructions and education.  Patient Consent Given: Yes Education handout provided: Previously provided Muscles treated: R posterior UT, cervical paraspinals, suboccipitals.  Electrical stimulation performed: No Parameters: N/A Treatment response/outcome: No adverse effects. Pt reports elimination of tingling symptoms.  PATIENT EDUCATION:  Education details: Educated pt on anatomy and physiology of current symptoms, FOTO, diagnosis, prognosis, HEP,  and POC. Person educated: Patient Education method: Medical illustrator Education comprehension: verbalized understanding and returned demonstration  HOME EXERCISE PROGRAM: Access Code: [email or web address] URL: [email or web address] Date: 10/15/2022 Prepared by: Royal Hawthorn  Exercises - Seated Scapular Retraction  - 2 x daily -  7 x weekly - 2 sets - 10 reps - Seated Upper Trapezius Stretch  - 2 x daily - 7 x weekly - 2 sets - 2 reps - 30 hold - Seated Levator Scapulae Stretch  - 2 x daily - 7 x weekly - 2 sets - 2 reps - 30 hold - Putty Squeezes  - 2 x daily - 7 x weekly - 2 sets - 10 reps - Median Nerve Tensioner  - 2 x daily - 7 x weekly - 2 sets - 10 reps  ASSESSMENT:  CLINICAL IMPRESSION: Pt continues to report Rt UE pain.  She will have MRI at the end of this week.  Pt is working on postural corrections, nerve glides and flexibility exercises at home.  Good twitch response to DN today with twitch, improved tissue mobility and reduced pain and tension after manual therapy reported today.  Pt continues to respond well to traction with reduced symptoms after.  Pt will continue to benefit from skilled PT to address continued deficits.    OBJECTIVE IMPAIRMENTS: decreased activity tolerance, decreased shoulder mobility, decreased ROM, decreased strength, impaired flexibility, impaired UE use, postural dysfunction, and pain.  ACTIVITY LIMITATIONS: reaching, lifting, carry,  cleaning, driving, and or occupation  PERSONAL FACTORS:  also affecting patient's functional outcome.  REHAB POTENTIAL: Good  CLINICAL DECISION MAKING: Stable/uncomplicated  EVALUATION COMPLEXITY: Low    GOALS: Short term PT Goals Target date: 10/29/2022 Pt will be I and compliant with HEP. Baseline:  Goal status: MET (10/26/22) Pt will decrease pain by 25% overall Baseline: Goal status: New  Long term PT goals Target date: 12/03/2022  Pt will improve cervical shoulder AROM to [facility] to improve functional reaching Baseline: Goal status: New Pt will improve bilat shoulder strength to at least 4+/5 MMT to improve functional strength Baseline: Goal status: New Pt will improve FOTO to at least 57% functional to show improved function Baseline: Goal status: New Pt will reduce pain to overall less than <2/10 with usual activity and work  activity. Baseline: Goal status: New Pt will be able to hold head in neutral without onset of symptoms.  Baseline: Goal status: New Pt will improve posture with min cues.  Baseline: Goal status: New  PLAN: PT FREQUENCY: 2x per week   PT DURATION: 6 weeks.   PLANNED INTERVENTIONS (unless contraindicated): aquatic PT, Canalith repositioning, cryotherapy, Electrical stimulation, Iontophoresis with 4 mg/ml dexamethasome, Moist heat, traction, Ultrasound, gait training, Therapeutic exercise, balance training, neuromuscular re-education, patient/family education, prosthetic training, manual techniques, passive ROM, dry needling, taping, vasopnuematic device, vestibular, spinal manipulations, joint manipulations  PLAN FOR NEXT SESSION: continue traction if helpful, DN 1x/wk, emphasize neutral postural alignment and strength, postural strength.  Measure cervical A/ROM   Lorrene Reid, PT 10/26/22 9:23 AM

## 2022-10-27 LAB — SURGICAL PATHOLOGY

## 2022-10-27 NOTE — Therapy (Unsigned)
OUTPATIENT PHYSICAL THERAPY CERVICAL TREATMENT   Patient Name: [name] MRN: [id number] DOB:[birth year], 72 y.o., female Today's Date: 10/29/2022  END OF SESSION:  PT End of Session - 10/29/22 1432     Visit Number 6    Number of Visits 12    Date for PT Re-Evaluation 12/03/22    Authorization Type [employer]    Progress Note Due on Visit 10                 Past Medical History:  Diagnosis Date   Asthma    has rescue inhaler, now on Flovent   Chronic back pain    Diabetes mellitus without complication    on medformin, on Ozempic   GERD (gastroesophageal reflux disease)    History of COVID-19 04/05/2021   Hyperlipemia    Hypertension    Hypothyroidism    Insomnia    melanoma 11/2021   vulva   OAB (overactive bladder)    Seasonal allergies    Sleep apnea    uses a cpap   Past Surgical History:  Procedure Laterality Date   ANKLE ARTHROTOMY  2003   left-fx   APPENDECTOMY     back fusion  02/14/2019   L4 back fusion   BACK SURGERY  2010   lumb disk   BREAST LUMPECTOMY WITH RADIOACTIVE SEED LOCALIZATION Left 06/13/2019   Procedure: LEFT BREAST LUMPECTOMY WITH RADIOACTIVE SEED LOCALIZATION;  Surgeon: Harriette Bouillon, MD;  Location: [facility];  Service: General;  Laterality: Left;   CARPAL TUNNEL RELEASE     rt   CARPAL TUNNEL RELEASE Left 08/01/2013   Procedure: LEFT CARPAL TUNNEL RELEASE;  Surgeon: Nicki Reaper, MD;  Location: [facility];  Service: Orthopedics;  Laterality: Left;   CESAREAN SECTION  83,86   COLONOSCOPY     DILATATION & CURETTAGE/HYSTEROSCOPY WITH MYOSURE N/A 08/23/2018   Procedure: DILATATION & CURETTAGE/HYSTEROSCOPY WITH MYOSURE POLYPECTOMY;  Surgeon: Patton Salles, MD;  Location: [facility];  Service: Gynecology;  Laterality: N/A;  fibroid resection   DILATION AND CURETTAGE OF UTERUS     x2   MASS EXCISION N/A 08/04/2017   Procedure: EXCISION LIPOMA UPPER BACK x2 AND  NECK;  Surgeon: Harriette Bouillon, MD;  Location: [facility];  Service: General;  Laterality: N/A;   NASAL SEPTOPLASTY W/ TURBINOPLASTY Bilateral 06/21/2015   Procedure: NASAL SEPTOPLASTY WITH BILATERAL TURBINATE REDUCTION;  Surgeon: Osborn Coho, MD;  Location: [facility] OR;  Service: ENT;  Laterality: Bilateral;   SINOSCOPY     TONSILLECTOMY     TUBAL LIGATION     VULVECTOMY N/A 12/24/2021   Procedure: WIDE EXCISION VULVECTOMY;  Surgeon: Carver Fila, MD;  Location: [facility];  Service: Gynecology;  Laterality: N/A;   Patient Active Problem List   Diagnosis Date Noted   Melanoma in situ 07/17/2022   Hypothyroidism 01/22/2022   Insomnia 01/22/2022   Lipoma 01/22/2022   Major depression, single episode 01/22/2022   Morbid obesity 01/22/2022   Overactive bladder 01/22/2022   Type 2 diabetes mellitus without complications 01/22/2022   Allergic rhinitis due to pollen 01/22/2022   Melanoma of vulva 08/30/2021   BMI 37.0-37.9, adult 08/30/2021   Spondylolisthesis of lumbosacral region 02/14/2019   Mild persistent asthma without complication 06/10/2018   Mixed rhinitis 06/10/2018   Obstructive sleep apnea 12/11/2016   Palpitations 06/17/2016   Deflected nasal septum 09/13/2015   Hypertrophy of nasal turbinates 09/13/2015   Deviated  nasal septum 06/21/2015    Class: Chronic   HYPERLIPIDEMIA 11/18/2007   Essential hypertension 11/18/2007   Allergic rhinitis 11/18/2007   COUGH 11/18/2007    PCP: Camie Patience, FNP   REFERRING PROVIDER: William Hamburger, PA  REFERRING DIAG:  [phone] (ICD-10-CM) - Brachial neuritis  M54.16 (ICD-10-CM) - Lumbar radiculopathy    THERAPY DIAG:  Abnormal posture  Radiculopathy, cervical region  Muscle weakness (generalized)  Cramp and spasm  Rationale for Evaluation and Treatment: Rehabilitation  ONSET DATE: 3+ months    SUBJECTIVE:                                                                                                                                                                                                          SUBJECTIVE STATEMENT: I got an injection in the Rt knee and it helped.  My neck and arm are not great.  I had a very busy week last week with appts. I tried to rest yesterday due to recovery from procedures.    Pt states that she had an insidious onset of tingling in her R UE. She reports no know MOI. Pt to receive an MRI of lower back and neck on April 27th. Pt is able to reduce symptoms with head tilt to the L.  Hand dominance: Right  PERTINENT HISTORY:  Asthma, chronic back pain, DM, GERD, HTN, hypothyroidism, Sleep apnea.   PAIN:  Are you having pain? Yes: NPRS scale: 5-6/10 Pain location: R upper extremity into hand.  Pain description: N/T Aggravating factors: Head in neutral position, sleeping positions, in the morning  Relieving factors: Tilting head to Lt , dry needling.  PRECAUTIONS: None   WEIGHT BEARING RESTRICTIONS: No   FALLS:  Has patient fallen in last 6 months? No   LIVING ENVIRONMENT: Lives with: lives with their family Lives in: House/apartment   OCCUPATION: retired Engineer, civil (consulting)    PLOF: Independent, Independent with basic ADLs, Independent with household mobility without device, Independent with community mobility without device, Independent with gait, and Independent with transfers   PATIENT GOALS: To eliminate neck pain and avoid further problems.    NEXT MD VISIT: as needed  OBJECTIVE:   DIAGNOSTIC FINDINGS:  None recently.   PATIENT SURVEYS:  FOTO 45.49%  COGNITION: Overall cognitive status: Within functional limits for tasks assessed  SENSATION: N/T into thumb and index finger.   POSTURE: rounded shoulders, forward head, and Head tilt to L.   PALPATION: Tenderness to R cervical paraspinals and into UT. Pt reports her index finger being very itchy.  CERVICAL ROM:   Active ROM A/PROM (deg) eval  Flexion 56  Extension  18 with numbness, tingling  Right lateral flexion 40  Left lateral flexion 33 with numbness, tingling and stiffness reported.  Right rotation 26 tight, numbness, tingling  Left rotation 45   (Blank rows = not tested)  UPPER EXTREMITY ROM:  Active ROM Right eval Left eval  Shoulder flexion [facility]  Shoulder abduction [facility]  Shoulder internal rotation [facility]  Shoulder external rotation WFL WFL   (Blank rows = not tested)  UPPER EXTREMITY MMT:  MMT Right eval Left eval  Shoulder flexion 4- 4-  Shoulder abduction 4- 4-  Shoulder internal rotation 4 4  Shoulder external rotation 4 4  Grip strength 50 53   (Blank rows = not tested)  CERVICAL SPECIAL TESTS:  Upper limb tension test (ULTT): Positive and Spurling's test: Positive  TODAY'S TREATMENT: Date 10/29/2022: Discussed HEP and purpose of each exercise Trigger Point Dry-Needling  Treatment instructions: Expect mild to moderate muscle soreness. S/S of pneumothorax if dry needled over a lung field, and to seek immediate medical attention should they occur. Patient verbalized understanding of these instructions and education. Patient Consent Given: Yes Education handout provided: Previously provided Muscles treated: bil multifidi, bil upper traps and levator Elongation and release to Rt>Lt neck after DN.  Cervical mechanical traction: 18#/5# x 12 minutes 60 seconds/10 seconds     DATE: 10/26/2022 Discussed HEP and purpose of each exercise Trigger Point Dry-Needling  Treatment instructions: Expect mild to moderate muscle soreness. S/S of pneumothorax if dry needled over a lung field, and to seek immediate medical attention should they occur. Patient verbalized understanding of these instructions and education.  Patient Consent Given: Yes Education handout provided: Previously provided Muscles treated: bil multifidi, bil upper traps and levator Elongation and release to Rt>Lt neck after DN.  Cervical mechanical  traction: 15#/5# x 12 minutes 60 seconds/10 seconds   DATE: 10/23/2022 UBE 2 fwd/ 2 bkwd Chin tucks Seated scap retraction  Discussion regarding symptoms and importance of exercises. Anatomy of neck and decreased lordosis present.  Cervical mechanical traction: 15#/5# x 15 minutes 60 seconds/10 seconds  DATE: 10/19/2022 Discussion regarding symptoms and need to have symmetrical posture.  Discussed the purpose of each exercise to create space in the cervical spine.  Trigger Point Dry-Needling  Treatment instructions: Expect mild to moderate muscle soreness. S/S of pneumothorax if dry needled over a lung field, and to seek immediate medical attention should they occur. Patient verbalized understanding of these instructions and education.  Patient Consent Given: Yes Education handout provided: Previously provided Muscles treated: bil multifidi, bil upper traps and levator Elongation and release to Rt>Lt neck after DN.  Cervical mechanical traction: 15#/5# x 12 minutes 60 seconds/10 seconds   DATE: 10/16/2022 UBE 2 fwd/ 2 bkwd UT/ LS stretch 2x30 sec holds.  Chin tucks Nerve glides STM following TPDN to R UT, cervical paraspinals.   Trigger Point Dry-Needling  Treatment instructions: Expect mild to moderate muscle soreness. S/S of pneumothorax if dry needled over a lung field, and to seek immediate medical attention should they occur. Patient verbalized understanding of these instructions and education.  Patient Consent Given: Yes Education handout provided: Previously provided Muscles treated: R posterior UT, cervical paraspinals, suboccipitals.  Electrical stimulation performed: No Parameters: N/A Treatment response/outcome: No adverse effects. Pt reports elimination of tingling symptoms.  PATIENT EDUCATION:  Education details: Educated pt on anatomy and  physiology of current symptoms, FOTO, diagnosis, prognosis, HEP,  and POC. Person educated: Patient Education method: Medical illustrator Education comprehension: verbalized understanding and returned demonstration  HOME EXERCISE PROGRAM: Access Code: [email or web address] URL: [email or web address] Date: 10/15/2022 Prepared by: Royal Hawthorn  Exercises - Seated Scapular Retraction  - 2 x daily - 7 x weekly - 2 sets - 10 reps - Seated Upper Trapezius Stretch  - 2 x daily - 7 x weekly - 2 sets - 2 reps - 30 hold - Seated Levator Scapulae Stretch  - 2 x daily - 7 x weekly - 2 sets - 2 reps - 30 hold - Putty Squeezes  - 2 x daily - 7 x weekly - 2 sets - 10 reps - Median Nerve Tensioner  - 2 x daily - 7 x weekly - 2 sets - 10 reps  ASSESSMENT:  CLINICAL IMPRESSION: Pt continues to report Rt UE pain, although she reports some relief yesterday and this morning. Pt states that she noted increased pain when driving to PT today. She will have MRI on Saturday.  Pt is working on postural corrections, nerve glides and flexibility exercises at home.  Minimal twitch response to DN today, improved tissue mobility and reduced pain and tension after manual therapy reported today.  Pt continues to respond well to traction with reduced symptoms after.  Assessed pt's posture in car. Pt has a very forward head rest causing anterior translation of head in comparison to shoulders. Moved seat around a little bit, but plan to continue to adjust as needed to help eliminate symptoms. Pt will continue to benefit from skilled PT to address continued deficits.    OBJECTIVE IMPAIRMENTS: decreased activity tolerance, decreased shoulder mobility, decreased ROM, decreased strength, impaired flexibility, impaired UE use, postural dysfunction, and pain.  ACTIVITY LIMITATIONS: reaching, lifting, carry,  cleaning, driving, and or occupation  PERSONAL FACTORS:  also affecting patient's functional outcome.  REHAB  POTENTIAL: Good  CLINICAL DECISION MAKING: Stable/uncomplicated  EVALUATION COMPLEXITY: Low    GOALS: Short term PT Goals Target date: 10/29/2022 Pt will be I and compliant with HEP. Baseline:  Goal status: MET (10/26/22) Pt will decrease pain by 25% overall Baseline: Goal status: New  Long term PT goals Target date: 12/03/2022  Pt will improve cervical shoulder AROM to [facility] to improve functional reaching Baseline: Goal status: New Pt will improve bilat shoulder strength to at least 4+/5 MMT to improve functional strength Baseline: Goal status: New Pt will improve FOTO to at least 57% functional to show improved function Baseline: Goal status: New Pt will reduce pain to overall less than <2/10 with usual activity and work activity. Baseline: Goal status: New Pt will be able to hold head in neutral without onset of symptoms.  Baseline: Goal status: New Pt will improve posture with min cues.  Baseline: Goal status: New  PLAN: PT FREQUENCY: 2x per week   PT DURATION: 6 weeks.   PLANNED INTERVENTIONS (unless contraindicated): aquatic PT, Canalith repositioning, cryotherapy, Electrical stimulation, Iontophoresis with 4 mg/ml dexamethasome, Moist heat, traction, Ultrasound, gait training, Therapeutic exercise, balance training, neuromuscular re-education, patient/family education, prosthetic training, manual techniques, passive ROM, dry needling, taping, vasopnuematic device, vestibular, spinal manipulations, joint manipulations  PLAN FOR NEXT SESSION: continue traction if helpful, DN 1x/wk, emphasize neutral postural alignment and strength, postural strength.  Measure cervical A/ROM  Royal Hawthorn PT, DPT 10/29/22  2:59 PM

## 2022-10-29 ENCOUNTER — Ambulatory Visit: Payer: PPO | Admitting: Physical Therapy

## 2022-10-29 ENCOUNTER — Encounter: Payer: Self-pay | Admitting: Physical Therapy

## 2022-10-29 DIAGNOSIS — R293 Abnormal posture: Secondary | ICD-10-CM | POA: Diagnosis not present

## 2022-10-29 DIAGNOSIS — M6281 Muscle weakness (generalized): Secondary | ICD-10-CM

## 2022-10-29 DIAGNOSIS — M5412 Radiculopathy, cervical region: Secondary | ICD-10-CM

## 2022-10-29 DIAGNOSIS — R252 Cramp and spasm: Secondary | ICD-10-CM

## 2022-10-31 ENCOUNTER — Ambulatory Visit
Admission: RE | Admit: 2022-10-31 | Discharge: 2022-10-31 | Disposition: A | Discharge status: Home / Self Care | Payer: PPO | Source: Ambulatory Visit | Attending: Physician Assistant | Admitting: Physician Assistant

## 2022-10-31 DIAGNOSIS — R200 Anesthesia of skin: Secondary | ICD-10-CM | POA: Diagnosis not present

## 2022-10-31 DIAGNOSIS — R202 Paresthesia of skin: Secondary | ICD-10-CM | POA: Diagnosis not present

## 2022-10-31 DIAGNOSIS — M5412 Radiculopathy, cervical region: Secondary | ICD-10-CM

## 2022-10-31 DIAGNOSIS — M5416 Radiculopathy, lumbar region: Secondary | ICD-10-CM

## 2022-10-31 DIAGNOSIS — M25551 Pain in right hip: Secondary | ICD-10-CM | POA: Diagnosis not present

## 2022-10-31 DIAGNOSIS — M79651 Pain in right thigh: Secondary | ICD-10-CM | POA: Diagnosis not present

## 2022-11-02 ENCOUNTER — Ambulatory Visit: Payer: PPO

## 2022-11-02 DIAGNOSIS — R293 Abnormal posture: Secondary | ICD-10-CM | POA: Diagnosis not present

## 2022-11-02 DIAGNOSIS — M5412 Radiculopathy, cervical region: Secondary | ICD-10-CM

## 2022-11-02 DIAGNOSIS — M6281 Muscle weakness (generalized): Secondary | ICD-10-CM

## 2022-11-02 DIAGNOSIS — R252 Cramp and spasm: Secondary | ICD-10-CM

## 2022-11-02 NOTE — Therapy (Signed)
OUTPATIENT PHYSICAL THERAPY CERVICAL TREATMENT   Patient Name: [name] MRN: [id number] DOB:Apr 24, 1951, 72 y.o., female Today's Date: 11/02/2022  END OF SESSION:  PT End of Session - 11/02/22 0926     Visit Number 7    Date for PT Re-Evaluation 12/03/22    Authorization Type [employer]    Progress Note Due on Visit 10    PT Start Time 0850    PT Stop Time 0935    PT Time Calculation (min) 45 min    Activity Tolerance Patient tolerated treatment well    Behavior During Therapy [facility] for tasks assessed/performed                  Past Medical History:  Diagnosis Date   Asthma    has rescue inhaler, now on Flovent   Chronic back pain    Diabetes mellitus without complication (HCC)    on medformin, on Ozempic   GERD (gastroesophageal reflux disease)    History of COVID-19 04/05/2021   Hyperlipemia    Hypertension    Hypothyroidism    Insomnia    melanoma 11/2021   vulva   OAB (overactive bladder)    Seasonal allergies    Sleep apnea    uses a cpap   Past Surgical History:  Procedure Laterality Date   ANKLE ARTHROTOMY  2003   left-fx   APPENDECTOMY     back fusion  02/14/2019   L4 back fusion   BACK SURGERY  2010   lumb disk   BREAST LUMPECTOMY WITH RADIOACTIVE SEED LOCALIZATION Left 06/13/2019   Procedure: LEFT BREAST LUMPECTOMY WITH RADIOACTIVE SEED LOCALIZATION;  Surgeon: Harriette Bouillon, MD;  Location: [facility];  Service: General;  Laterality: Left;   CARPAL TUNNEL RELEASE     rt   CARPAL TUNNEL RELEASE Left 08/01/2013   Procedure: LEFT CARPAL TUNNEL RELEASE;  Surgeon: Nicki Reaper, MD;  Location: [facility];  Service: Orthopedics;  Laterality: Left;   CESAREAN SECTION  83,86   COLONOSCOPY     DILATATION & CURETTAGE/HYSTEROSCOPY WITH MYOSURE N/A 08/23/2018   Procedure: DILATATION & CURETTAGE/HYSTEROSCOPY WITH MYOSURE POLYPECTOMY;  Surgeon: Patton Salles, MD;  Location: [facility];   Service: Gynecology;  Laterality: N/A;  fibroid resection   DILATION AND CURETTAGE OF UTERUS     x2   MASS EXCISION N/A 08/04/2017   Procedure: EXCISION LIPOMA UPPER BACK x2 AND NECK;  Surgeon: Harriette Bouillon, MD;  Location: [facility];  Service: General;  Laterality: N/A;   NASAL SEPTOPLASTY W/ TURBINOPLASTY Bilateral 06/21/2015   Procedure: NASAL SEPTOPLASTY WITH BILATERAL TURBINATE REDUCTION;  Surgeon: Osborn Coho, MD;  Location: [facility] OR;  Service: ENT;  Laterality: Bilateral;   SINOSCOPY     TONSILLECTOMY     TUBAL LIGATION     VULVECTOMY N/A 12/24/2021   Procedure: WIDE EXCISION VULVECTOMY;  Surgeon: Carver Fila, MD;  Location: [facility];  Service: Gynecology;  Laterality: N/A;   Patient Active Problem List   Diagnosis Date Noted   Melanoma in situ (HCC) 07/17/2022   Hypothyroidism 01/22/2022   Insomnia 01/22/2022   Lipoma 01/22/2022   Major depression, single episode 01/22/2022   Morbid obesity (HCC) 01/22/2022   Overactive bladder 01/22/2022   Type 2 diabetes mellitus without complications (HCC) 01/22/2022   Allergic rhinitis due to pollen 01/22/2022   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HCC) 08/30/2021   BMI 37.0-37.9, adult 08/30/2021   Spondylolisthesis of lumbosacral  region 02/14/2019   Mild persistent asthma without complication 06/10/2018   Mixed rhinitis 06/10/2018   Obstructive sleep apnea 12/11/2016   Palpitations 06/17/2016   Deflected nasal septum 09/13/2015   Hypertrophy of nasal turbinates 09/13/2015   Deviated nasal septum 06/21/2015    Class: Chronic   HYPERLIPIDEMIA 11/18/2007   Essential hypertension 11/18/2007   Allergic rhinitis 11/18/2007   COUGH 11/18/2007    PCP: Camie Patience, FNP   REFERRING PROVIDER: William Hamburger, PA  REFERRING DIAG:  [phone] (ICD-10-CM) - Brachial neuritis  M54.16 (ICD-10-CM) - Lumbar radiculopathy    THERAPY DIAG:  Abnormal posture  Muscle weakness (generalized)  Radiculopathy,  cervical region  Cramp and spasm  Rationale for Evaluation and Treatment: Rehabilitation  ONSET DATE: 3+ months    SUBJECTIVE:                                                                                                                                                                                                         SUBJECTIVE STATEMENT: I haven't been lifting my grandsons much.  I think the traction and DN are helping me.  The corrections to my car has helped.  I've been doing my exercises as much as I can.  Rt arm is 10% better.    Pt states that she had an insidious onset of tingling in her R UE. She reports no know MOI. Pt to receive an MRI of lower back and neck on April 27th. Pt is able to reduce symptoms with head tilt to the L.  Hand dominance: Right  PERTINENT HISTORY:  Asthma, chronic back pain, DM, GERD, HTN, hypothyroidism, Sleep apnea.   PAIN:  Are you having pain? Yes: NPRS scale: 5-6/10 Pain location: R upper extremity into hand.  Pain description: N/T Aggravating factors: Head in neutral position, sleeping positions, in the morning  Relieving factors: Tilting head to Lt , dry needling.  PRECAUTIONS: None   WEIGHT BEARING RESTRICTIONS: No   FALLS:  Has patient fallen in last 6 months? No   LIVING ENVIRONMENT: Lives with: lives with their family Lives in: House/apartment   OCCUPATION: retired Engineer, civil (consulting)    PLOF: Independent, Independent with basic ADLs, Independent with household mobility without device, Independent with community mobility without device, Independent with gait, and Independent with transfers   PATIENT GOALS: To eliminate neck pain and avoid further problems.    NEXT MD VISIT: as needed  OBJECTIVE:   DIAGNOSTIC FINDINGS:  None recently.   PATIENT SURVEYS:  FOTO 45.49%  COGNITION: Overall cognitive status: Within functional limits for tasks  assessed  SENSATION: N/T into thumb and index finger.   POSTURE: rounded shoulders,  forward head, and Head tilt to L.   PALPATION: Tenderness to R cervical paraspinals and into UT. Pt reports her index finger being very itchy.    CERVICAL ROM:   Active ROM A/PROM (deg) eval  Flexion 56  Extension 18 with numbness, tingling  Right lateral flexion 40  Left lateral flexion 33 with numbness, tingling and stiffness reported.  Right rotation 26 tight, numbness, tingling  Left rotation 45   (Blank rows = not tested)  UPPER EXTREMITY ROM:  Active ROM Right eval Left eval  Shoulder flexion [facility]  Shoulder abduction [facility]  Shoulder internal rotation [facility]  Shoulder external rotation WFL WFL   (Blank rows = not tested)  UPPER EXTREMITY MMT:  MMT Right eval Left eval  Shoulder flexion 4- 4-  Shoulder abduction 4- 4-  Shoulder internal rotation 4 4  Shoulder external rotation 4 4  Grip strength 50 53   (Blank rows = not tested)  CERVICAL SPECIAL TESTS:  Upper limb tension test (ULTT): Positive and Spurling's test: Positive  TODAY'S TREATMENT: Date 11/02/2022: Discussed frequency of exercise and postural alignment Trigger Point Dry-Needling  Treatment instructions: Expect mild to moderate muscle soreness. S/S of pneumothorax if dry needled over a lung field, and to seek immediate medical attention should they occur. Patient verbalized understanding of these instructions and education. Patient Consent Given: Yes Education handout provided: Previously provided Muscles treated: bil multifidi, bil upper traps and levator Elongation and release to Rt>Lt neck after DN.  Cervical mechanical traction: 18#/5# x 12 minutes 60 seconds/10 seconds     Date 10/29/2022: Discussed HEP and purpose of each exercise Trigger Point Dry-Needling  Treatment instructions: Expect mild to moderate muscle soreness. S/S of pneumothorax if dry needled over a lung field, and to seek immediate medical attention should they occur. Patient verbalized understanding of these  instructions and education. Patient Consent Given: Yes Education handout provided: Previously provided Muscles treated: bil multifidi, bil upper traps and levator Elongation and release to Rt>Lt neck after DN.  Cervical mechanical traction: 18#/5# x 12 minutes 60 seconds/10 seconds     DATE: 10/26/2022 Discussed HEP and purpose of each exercise Trigger Point Dry-Needling  Treatment instructions: Expect mild to moderate muscle soreness. S/S of pneumothorax if dry needled over a lung field, and to seek immediate medical attention should they occur. Patient verbalized understanding of these instructions and education.  Patient Consent Given: Yes Education handout provided: Previously provided Muscles treated: bil multifidi, bil upper traps and levator Elongation and release to Rt>Lt neck after DN.  Cervical mechanical traction: 15#/5# x 12 minutes 60 seconds/10 seconds   PATIENT EDUCATION:  Education details: Educated pt on anatomy and physiology of current symptoms, FOTO, diagnosis, prognosis, HEP,  and POC. Person educated: Patient Education method: Medical illustrator Education comprehension: verbalized understanding and returned demonstration  HOME EXERCISE PROGRAM: Access Code: [email or web address] URL: [email or web address] Date: 10/15/2022 Prepared by: Royal Hawthorn  Exercises - Seated Scapular Retraction  - 2 x daily - 7 x weekly - 2 sets - 10 reps - Seated Upper Trapezius Stretch  - 2 x daily - 7 x weekly - 2 sets - 2 reps - 30 hold - Seated Levator Scapulae Stretch  - 2 x daily - 7 x weekly - 2 sets - 2 reps - 30 hold - Putty Squeezes  - 2 x daily - 7 x weekly - 2 sets - 10  reps - Median Nerve Tensioner  - 2 x daily - 7 x weekly - 2 sets - 10 reps  ASSESSMENT:  CLINICAL IMPRESSION: Pt reports 10% overall reduction in Rt UE pain since the start of care.   Pt is consistent and independent with HEP for strength, posture and flexibility.  Pt with good response to  DN with improved tissue mobility and reduction of Rt UE symptoms after traction today.  Pt had MRI over the weekend and will get results this week.   Pt will continue to benefit from skilled PT to address continued deficits.    OBJECTIVE IMPAIRMENTS: decreased activity tolerance, decreased shoulder mobility, decreased ROM, decreased strength, impaired flexibility, impaired UE use, postural dysfunction, and pain.  ACTIVITY LIMITATIONS: reaching, lifting, carry,  cleaning, driving, and or occupation  PERSONAL FACTORS:  also affecting patient's functional outcome.  REHAB POTENTIAL: Good  CLINICAL DECISION MAKING: Stable/uncomplicated  EVALUATION COMPLEXITY: Low    GOALS: Short term PT Goals Target date: 10/29/2022 Pt will be I and compliant with HEP. Baseline:  Goal status: MET (10/26/22) Pt will decrease pain by 25% overall Baseline: 10% (11/02/22) Goal status: On going   Long term PT goals Target date: 12/03/2022  Pt will improve cervical shoulder AROM to [facility] to improve functional reaching Baseline: Goal status: New Pt will improve bilat shoulder strength to at least 4+/5 MMT to improve functional strength Baseline: Goal status: New Pt will improve FOTO to at least 57% functional to show improved function Baseline: Goal status: New Pt will reduce pain to overall less than <2/10 with usual activity and work activity. Baseline: Goal status: New Pt will be able to hold head in neutral without onset of symptoms.  Baseline: tilts to Lt when she gets symptoms (11/02/22) Goal status: on going Pt will improve posture with min cues.  Baseline: Goal status: on going  PLAN: PT FREQUENCY: 2x per week   PT DURATION: 6 weeks.   PLANNED INTERVENTIONS (unless contraindicated): aquatic PT, Canalith repositioning, cryotherapy, Electrical stimulation, Iontophoresis with 4 mg/ml dexamethasome, Moist heat, traction, Ultrasound, gait training, Therapeutic exercise, balance training,  neuromuscular re-education, patient/family education, prosthetic training, manual techniques, passive ROM, dry needling, taping, vasopnuematic device, vestibular, spinal manipulations, joint manipulations  PLAN FOR NEXT SESSION: continue traction if helpful, DN 1x/wk, emphasize neutral postural alignment and strength, postural strength.  Measure cervical A/ROM  Royal Hawthorn PT, DPT 11/02/22  9:41 AM

## 2022-11-09 NOTE — Therapy (Unsigned)
OUTPATIENT PHYSICAL THERAPY CERVICAL TREATMENT   Patient Name: [name] MRN: [id number] DOB:Feb 25, 1951, 72 y.o., female Today's Date: 11/10/2022  END OF SESSION:  PT End of Session - 11/10/22 1147     Visit Number 8    Number of Visits 12    Date for PT Re-Evaluation 12/03/22    Authorization Type [employer]    Progress Note Due on Visit 10    PT Start Time 1015    PT Stop Time 1055    PT Time Calculation (min) 40 min    Activity Tolerance Patient tolerated treatment well    Behavior During Therapy WFL for tasks assessed/performed                   Past Medical History:  Diagnosis Date   Asthma    has rescue inhaler, now on Flovent   Chronic back pain    Diabetes mellitus without complication (HCC)    on medformin, on Ozempic   GERD (gastroesophageal reflux disease)    History of COVID-19 04/05/2021   Hyperlipemia    Hypertension    Hypothyroidism    Insomnia    melanoma 11/2021   vulva   OAB (overactive bladder)    Seasonal allergies    Sleep apnea    uses a cpap   Past Surgical History:  Procedure Laterality Date   ANKLE ARTHROTOMY  2003   left-fx   APPENDECTOMY     back fusion  02/14/2019   L4 back fusion   BACK SURGERY  2010   lumb disk   BREAST LUMPECTOMY WITH RADIOACTIVE SEED LOCALIZATION Left 06/13/2019   Procedure: LEFT BREAST LUMPECTOMY WITH RADIOACTIVE SEED LOCALIZATION;  Surgeon: Harriette Bouillon, MD;  Location: [facility];  Service: General;  Laterality: Left;   CARPAL TUNNEL RELEASE     rt   CARPAL TUNNEL RELEASE Left 08/01/2013   Procedure: LEFT CARPAL TUNNEL RELEASE;  Surgeon: Nicki Reaper, MD;  Location: [facility];  Service: Orthopedics;  Laterality: Left;   CESAREAN SECTION  83,86   COLONOSCOPY     DILATATION & CURETTAGE/HYSTEROSCOPY WITH MYOSURE N/A 08/23/2018   Procedure: DILATATION & CURETTAGE/HYSTEROSCOPY WITH MYOSURE POLYPECTOMY;  Surgeon: Patton Salles, MD;  Location:  [facility];  Service: Gynecology;  Laterality: N/A;  fibroid resection   DILATION AND CURETTAGE OF UTERUS     x2   MASS EXCISION N/A 08/04/2017   Procedure: EXCISION LIPOMA UPPER BACK x2 AND NECK;  Surgeon: Harriette Bouillon, MD;  Location: [facility];  Service: General;  Laterality: N/A;   NASAL SEPTOPLASTY W/ TURBINOPLASTY Bilateral 06/21/2015   Procedure: NASAL SEPTOPLASTY WITH BILATERAL TURBINATE REDUCTION;  Surgeon: Osborn Coho, MD;  Location: [facility];  Service: ENT;  Laterality: Bilateral;   SINOSCOPY     TONSILLECTOMY     TUBAL LIGATION     VULVECTOMY N/A 12/24/2021   Procedure: WIDE EXCISION VULVECTOMY;  Surgeon: Carver Fila, MD;  Location: [facility];  Service: Gynecology;  Laterality: N/A;   Patient Active Problem List   Diagnosis Date Noted   Melanoma in situ (HCC) 07/17/2022   Hypothyroidism 01/22/2022   Insomnia 01/22/2022   Lipoma 01/22/2022   Major depression, single episode 01/22/2022   Morbid obesity (HCC) 01/22/2022   Overactive bladder 01/22/2022   Type 2 diabetes mellitus without complications (HCC) 01/22/2022   Allergic rhinitis due to pollen 01/22/2022   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HCC) 08/30/2021   BMI  37.0-37.9, adult 08/30/2021   Spondylolisthesis of lumbosacral region 02/14/2019   Mild persistent asthma without complication 06/10/2018   Mixed rhinitis 06/10/2018   Obstructive sleep apnea 12/11/2016   Palpitations 06/17/2016   Deflected nasal septum 09/13/2015   Hypertrophy of nasal turbinates 09/13/2015   Deviated nasal septum 06/21/2015    Class: Chronic   HYPERLIPIDEMIA 11/18/2007   Essential hypertension 11/18/2007   Allergic rhinitis 11/18/2007   COUGH 11/18/2007    PCP: Camie Patience, FNP   REFERRING PROVIDER: William Hamburger, PA  REFERRING DIAG:  [phone] (ICD-10-CM) - Brachial neuritis  M54.16 (ICD-10-CM) - Lumbar radiculopathy    THERAPY DIAG:  Abnormal posture  Muscle weakness  (generalized)  Radiculopathy, cervical region  Cramp and spasm  Rationale for Evaluation and Treatment: Rehabilitation  ONSET DATE: 3+ months    SUBJECTIVE:                                                                                                                                                                                                         SUBJECTIVE STATEMENT: I went on a beach vacation and had to sit with my neck to the side to help with the pain. My hip was more painful then my neck by the end of the trip though. I got my MRI results back, from what I can see, there is a lot going on.   Pt states that she had an insidious onset of tingling in her R UE. She reports no know MOI. Pt to receive an MRI of lower back and neck on April 27th. Pt is able to reduce symptoms with head tilt to the L.  Hand dominance: Right  PERTINENT HISTORY:  Asthma, chronic back pain, DM, GERD, HTN, hypothyroidism, Sleep apnea.   PAIN:  Are you having pain? Yes: NPRS scale: 5-6/10 Pain location: R upper extremity into hand.  Pain description: N/T Aggravating factors: Head in neutral position, sleeping positions, in the morning  Relieving factors: Tilting head to Lt , dry needling.  PRECAUTIONS: None   WEIGHT BEARING RESTRICTIONS: No   FALLS:  Has patient fallen in last 6 months? No   LIVING ENVIRONMENT: Lives with: lives with their family Lives in: House/apartment   OCCUPATION: retired Engineer, civil (consulting)    PLOF: Independent, Independent with basic ADLs, Independent with household mobility without device, Independent with community mobility without device, Independent with gait, and Independent with transfers   PATIENT GOALS: To eliminate neck pain and avoid further problems.    NEXT MD VISIT: as needed  OBJECTIVE:   DIAGNOSTIC FINDINGS:  None  recently.   PATIENT SURVEYS:  FOTO 45.49%  COGNITION: Overall cognitive status: Within functional limits for tasks  assessed  SENSATION: N/T into thumb and index finger.   POSTURE: rounded shoulders, forward head, and Head tilt to L.   PALPATION: Tenderness to R cervical paraspinals and into UT. Pt reports her index finger being very itchy.    CERVICAL ROM:   Active ROM A/PROM (deg) eval  Flexion 56  Extension 18 with numbness, tingling  Right lateral flexion 40  Left lateral flexion 33 with numbness, tingling and stiffness reported.  Right rotation 26 tight, numbness, tingling  Left rotation 45   (Blank rows = not tested)  UPPER EXTREMITY ROM:  Active ROM Right eval Left eval  Shoulder flexion [facility]  Shoulder abduction [facility]  Shoulder internal rotation [facility]  Shoulder external rotation WFL WFL   (Blank rows = not tested)  UPPER EXTREMITY MMT:  MMT Right eval Left eval  Shoulder flexion 4- 4-  Shoulder abduction 4- 4-  Shoulder internal rotation 4 4  Shoulder external rotation 4 4  Grip strength 50 53   (Blank rows = not tested)  CERVICAL SPECIAL TESTS:  Upper limb tension test (ULTT): Positive and Spurling's test: Positive  TODAY'S TREATMENT: Date 11/10/2022: Discussed and reviewed MRI results.  Trigger Point Dry-Needling  Treatment instructions: Expect mild to moderate muscle soreness. S/S of pneumothorax if dry needled over a lung field, and to seek immediate medical attention should they occur. Patient verbalized understanding of these instructions and education. Patient Consent Given: Yes Education handout provided: Previously provided Muscles treated: R upper traps and levator, Rhomboid major.  Estim used with low frequency and low hz.  Seated scaption STM to R rhomboids, UT, LS and teres major/minor Manual thoracic decompression  Date 11/02/2022: Discussed frequency of exercise and postural alignment Trigger Point Dry-Needling  Treatment instructions: Expect mild to moderate muscle soreness. S/S of pneumothorax if dry needled over a lung field, and to  seek immediate medical attention should they occur. Patient verbalized understanding of these instructions and education. Patient Consent Given: Yes Education handout provided: Previously provided Muscles treated: bil multifidi, bil upper traps and levator Elongation and release to Rt>Lt neck after DN.  Cervical mechanical traction: 18#/5# x 12 minutes 60 seconds/10 seconds     Date 10/29/2022: Discussed HEP and purpose of each exercise Trigger Point Dry-Needling  Treatment instructions: Expect mild to moderate muscle soreness. S/S of pneumothorax if dry needled over a lung field, and to seek immediate medical attention should they occur. Patient verbalized understanding of these instructions and education. Patient Consent Given: Yes Education handout provided: Previously provided Muscles treated: bil multifidi, bil upper traps and levator Elongation and release to Rt>Lt neck after DN.  Cervical mechanical traction: 18#/5# x 12 minutes 60 seconds/10 seconds     DATE: 10/26/2022 Discussed HEP and purpose of each exercise Trigger Point Dry-Needling  Treatment instructions: Expect mild to moderate muscle soreness. S/S of pneumothorax if dry needled over a lung field, and to seek immediate medical attention should they occur. Patient verbalized understanding of these instructions and education.  Patient Consent Given: Yes Education handout provided: Previously provided Muscles treated: bil multifidi, bil upper traps and levator Elongation and release to Rt>Lt neck after DN.  Cervical mechanical traction: 15#/5# x 12 minutes 60 seconds/10 seconds   PATIENT EDUCATION:  Education details: Educated pt on anatomy and physiology of current symptoms, FOTO, diagnosis, prognosis, HEP,  and POC. Person educated: Patient Education method: Explanation  and Demonstration Education comprehension: verbalized understanding and returned demonstration  HOME EXERCISE PROGRAM: Access Code: [email or web address] URL:  [email or web address] Date: 10/15/2022 Prepared by: Royal Hawthorn  Exercises - Seated Scapular Retraction  - 2 x daily - 7 x weekly - 2 sets - 10 reps - Seated Upper Trapezius Stretch  - 2 x daily - 7 x weekly - 2 sets - 2 reps - 30 hold - Seated Levator Scapulae Stretch  - 2 x daily - 7 x weekly - 2 sets - 2 reps - 30 hold - Putty Squeezes  - 2 x daily - 7 x weekly - 2 sets - 10 reps - Median Nerve Tensioner  - 2 x daily - 7 x weekly - 2 sets - 10 reps  ASSESSMENT:  CLINICAL IMPRESSION: Pt states that she has noted slight improvements, but continues to have the numbness and tingling come back into her R UE. She states that she read her MRI and notes there is "a lot going on". Reviewed MRI results with pt, with discussion about compression of spinal cord and scoliosis present. Pt to follow up with MD on 11/19/2022 to review MRI results and discuss next steps. Tried to focus on thoracic mobility today with some decompression along with TPDN and e-stim. Pt reports a reduction in familiar symptoms upon completion of session. She reports "fear of movement" following session as she did not want to re-aggravate her symptoms. This can also be playing a role in her symptoms. Encouraged thoracic decompression by pulling on a counter top at home to help with mobility. Pt is consistent and independent with HEP for strength, posture and flexibility. Pt will continue to benefit from skilled PT to address continued deficits.    OBJECTIVE IMPAIRMENTS: decreased activity tolerance, decreased shoulder mobility, decreased ROM, decreased strength, impaired flexibility, impaired UE use, postural dysfunction, and pain.  ACTIVITY LIMITATIONS: reaching, lifting, carry,  cleaning, driving, and or occupation  PERSONAL FACTORS:  also affecting patient's functional outcome.  REHAB POTENTIAL: Good  CLINICAL DECISION MAKING: Stable/uncomplicated  EVALUATION COMPLEXITY: Low    GOALS: Short term PT Goals  Target date: 10/29/2022 Pt will be I and compliant with HEP. Baseline:  Goal status: MET (10/26/22) Pt will decrease pain by 25% overall Baseline: 10% (11/02/22) Goal status: On going   Long term PT goals Target date: 12/03/2022  Pt will improve cervical shoulder AROM to [facility] to improve functional reaching Baseline: Goal status: New Pt will improve bilat shoulder strength to at least 4+/5 MMT to improve functional strength Baseline: Goal status: New Pt will improve FOTO to at least 57% functional to show improved function Baseline: Goal status: New Pt will reduce pain to overall less than <2/10 with usual activity and work activity. Baseline: Goal status: New Pt will be able to hold head in neutral without onset of symptoms.  Baseline: tilts to Lt when she gets symptoms (11/02/22) Goal status: on going Pt will improve posture with min cues.  Baseline: Goal status: on going  PLAN: PT FREQUENCY: 2x per week   PT DURATION: 6 weeks.   PLANNED INTERVENTIONS (unless contraindicated): aquatic PT, Canalith repositioning, cryotherapy, Electrical stimulation, Iontophoresis with 4 mg/ml dexamethasome, Moist heat, traction, Ultrasound, gait training, Therapeutic exercise, balance training, neuromuscular re-education, patient/family education, prosthetic training, manual techniques, passive ROM, dry needling, taping, vasopnuematic device, vestibular, spinal manipulations, joint manipulations  PLAN FOR NEXT SESSION: continue traction if helpful, DN 1x/wk, emphasize neutral postural alignment and strength, postural strength.  Measure cervical A/ROM  Royal Hawthorn PT,  DPT 11/10/22  11:48 AM

## 2022-11-10 ENCOUNTER — Encounter: Payer: Self-pay | Admitting: Physical Therapy

## 2022-11-10 ENCOUNTER — Ambulatory Visit: Payer: PPO | Attending: Physician Assistant | Admitting: Physical Therapy

## 2022-11-10 DIAGNOSIS — M6281 Muscle weakness (generalized): Secondary | ICD-10-CM | POA: Insufficient documentation

## 2022-11-10 DIAGNOSIS — R252 Cramp and spasm: Secondary | ICD-10-CM | POA: Diagnosis not present

## 2022-11-10 DIAGNOSIS — R293 Abnormal posture: Secondary | ICD-10-CM | POA: Diagnosis not present

## 2022-11-10 DIAGNOSIS — M5412 Radiculopathy, cervical region: Secondary | ICD-10-CM | POA: Diagnosis not present

## 2022-11-12 ENCOUNTER — Ambulatory Visit: Payer: PPO

## 2022-11-12 DIAGNOSIS — R293 Abnormal posture: Secondary | ICD-10-CM | POA: Diagnosis not present

## 2022-11-12 DIAGNOSIS — R252 Cramp and spasm: Secondary | ICD-10-CM

## 2022-11-12 DIAGNOSIS — M5412 Radiculopathy, cervical region: Secondary | ICD-10-CM

## 2022-11-12 DIAGNOSIS — M6281 Muscle weakness (generalized): Secondary | ICD-10-CM

## 2022-11-12 NOTE — Therapy (Signed)
OUTPATIENT PHYSICAL THERAPY CERVICAL TREATMENT   Patient Name: [name] MRN: [id number] DOB:Sep 14, 1950, 72 y.o., female Today's Date: 11/12/2022  END OF SESSION:  PT End of Session - 11/12/22 1140     Visit Number 9    Date for PT Re-Evaluation 12/03/22    Authorization Type [employer]    Progress Note Due on Visit 10    PT Start Time 1101    PT Stop Time 1147    PT Time Calculation (min) 46 min    Activity Tolerance Patient tolerated treatment well    Behavior During Therapy WFL for tasks assessed/performed                    Past Medical History:  Diagnosis Date   Asthma    has rescue inhaler, now on Flovent   Chronic back pain    Diabetes mellitus without complication (HCC)    on medformin, on Ozempic   GERD (gastroesophageal reflux disease)    History of COVID-19 04/05/2021   Hyperlipemia    Hypertension    Hypothyroidism    Insomnia    melanoma 11/2021   vulva   OAB (overactive bladder)    Seasonal allergies    Sleep apnea    uses a cpap   Past Surgical History:  Procedure Laterality Date   ANKLE ARTHROTOMY  2003   left-fx   APPENDECTOMY     back fusion  02/14/2019   L4 back fusion   BACK SURGERY  2010   lumb disk   BREAST LUMPECTOMY WITH RADIOACTIVE SEED LOCALIZATION Left 06/13/2019   Procedure: LEFT BREAST LUMPECTOMY WITH RADIOACTIVE SEED LOCALIZATION;  Surgeon: Harriette Bouillon, MD;  Location: [facility];  Service: General;  Laterality: Left;   CARPAL TUNNEL RELEASE     rt   CARPAL TUNNEL RELEASE Left 08/01/2013   Procedure: LEFT CARPAL TUNNEL RELEASE;  Surgeon: Nicki Reaper, MD;  Location: [facility];  Service: Orthopedics;  Laterality: Left;   CESAREAN SECTION  83,86   COLONOSCOPY     DILATATION & CURETTAGE/HYSTEROSCOPY WITH MYOSURE N/A 08/23/2018   Procedure: DILATATION & CURETTAGE/HYSTEROSCOPY WITH MYOSURE POLYPECTOMY;  Surgeon: Patton Salles, MD;  Location: [facility];  Service: Gynecology;  Laterality: N/A;  fibroid resection   DILATION AND CURETTAGE OF UTERUS     x2   MASS EXCISION N/A 08/04/2017   Procedure: EXCISION LIPOMA UPPER BACK x2 AND NECK;  Surgeon: Harriette Bouillon, MD;  Location: [facility];  Service: General;  Laterality: N/A;   NASAL SEPTOPLASTY W/ TURBINOPLASTY Bilateral 06/21/2015   Procedure: NASAL SEPTOPLASTY WITH BILATERAL TURBINATE REDUCTION;  Surgeon: Osborn Coho, MD;  Location[facility];  Service: ENT;  Laterality: Bilateral;   SINOSCOPY     TONSILLECTOMY     TUBAL LIGATION     VULVECTOMY N/A 12/24/2021   Procedure: WIDE EXCISION VULVECTOMY;  Surgeon: Carver Fila, MD;  Location: [facility];  Service: Gynecology;  Laterality: N/A;   Patient Active Problem List   Diagnosis Date Noted   Melanoma in situ (HCC) 07/17/2022   Hypothyroidism 01/22/2022   Insomnia 01/22/2022   Lipoma 01/22/2022   Major depression, single episode 01/22/2022   Morbid obesity (HCC) 01/22/2022   Overactive bladder 01/22/2022   Type 2 diabetes mellitus without complications (HCC) 01/22/2022   Allergic rhinitis due to pollen 01/22/2022   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HCC) 08/30/2021   BMI 37.0-37.9, adult 08/30/2021   Spondylolisthesis  of lumbosacral region 02/14/2019   Mild persistent asthma without complication 06/10/2018   Mixed rhinitis 06/10/2018   Obstructive sleep apnea 12/11/2016   Palpitations 06/17/2016   Deflected nasal septum 09/13/2015   Hypertrophy of nasal turbinates 09/13/2015   Deviated nasal septum 06/21/2015    Class: Chronic   HYPERLIPIDEMIA 11/18/2007   Essential hypertension 11/18/2007   Allergic rhinitis 11/18/2007   COUGH 11/18/2007    PCP: Camie Patience, FNP   REFERRING PROVIDER: William Hamburger, PA  REFERRING DIAG:  [phone] (ICD-10-CM) - Brachial neuritis  M54.16 (ICD-10-CM) - Lumbar radiculopathy    THERAPY DIAG:  Abnormal posture  Muscle weakness  (generalized)  Radiculopathy, cervical region  Cramp and spasm  Rationale for Evaluation and Treatment: Rehabilitation  ONSET DATE: 3+ months    SUBJECTIVE:                                                                                                                                                                                                         SUBJECTIVE STATEMENT: What we did last visit really helped for a couple of days.  Pt states that she had an insidious onset of tingling in her R UE. She reports no know MOI. Pt to receive an MRI of lower back and neck on April 27th. Pt is able to reduce symptoms with head tilt to the L.  Hand dominance: Right  PERTINENT HISTORY:  Asthma, chronic back pain, DM, GERD, HTN, hypothyroidism, Sleep apnea.   PAIN:  Are you having pain? Yes: NPRS scale: 2-5/10 Pain location: R upper extremity into hand.  Pain description: N/T Aggravating factors: Head in neutral position, sleeping positions, in the morning  Relieving factors: Tilting head to Lt , dry needling, traction, home TENs.  PRECAUTIONS: None   WEIGHT BEARING RESTRICTIONS: No   FALLS:  Has patient fallen in last 6 months? No   LIVING ENVIRONMENT: Lives with: lives with their family Lives in: House/apartment   OCCUPATION: retired Engineer, civil (consulting)    PLOF: Independent, Independent with basic ADLs, Independent with household mobility without device, Independent with community mobility without device, Independent with gait, and Independent with transfers   PATIENT GOALS: To eliminate neck pain and avoid further problems.    NEXT MD VISIT: as needed  OBJECTIVE:   DIAGNOSTIC FINDINGS:  None recently.   PATIENT SURVEYS:  FOTO 45.49%  COGNITION: Overall cognitive status: Within functional limits for tasks assessed  SENSATION: N/T into thumb and index finger.   POSTURE: rounded shoulders, forward head, and Head tilt to L.   PALPATION: Tenderness to R  cervical paraspinals  and into UT. Pt reports her index finger being very itchy.    CERVICAL ROM:   Active ROM A/PROM (deg) eval  Flexion 56  Extension 18 with numbness, tingling  Right lateral flexion 40  Left lateral flexion 33 with numbness, tingling and stiffness reported.  Right rotation 26 tight, numbness, tingling  Left rotation 45   (Blank rows = not tested)  UPPER EXTREMITY ROM:  Active ROM Right eval Left eval  Shoulder flexion [facility]  Shoulder abduction [facility]  Shoulder internal rotation [facility]  Shoulder external rotation WFL WFL   (Blank rows = not tested)  UPPER EXTREMITY MMT:  MMT Right eval Left eval  Shoulder flexion 4- 4-  Shoulder abduction 4- 4-  Shoulder internal rotation 4 4  Shoulder external rotation 4 4  Grip strength 50 53   (Blank rows = not tested)  CERVICAL SPECIAL TESTS:  Upper limb tension test (ULTT): Positive and Spurling's test: Positive  TODAY'S TREATMENT: Date 11/12/2022: Arm bike: Level 1.4 x 6 minutes (3/3)-discussed progress with PT Supine cervical retraction 5" hold x5 Seated scaption x10-tingling in Rt UE Thoracic decompression- increased Rt UE symptoms despite multiple alignment changes  Manual: cervical mobs with movement and elongation to Rt upper traps and neck Cervical mechanical traction: 18#/5# x 12 minutes 60 seconds/10 seconds  Date 11/10/2022: Discussed and reviewed MRI results.  Trigger Point Dry-Needling  Treatment instructions: Expect mild to moderate muscle soreness. S/S of pneumothorax if dry needled over a lung field, and to seek immediate medical attention should they occur. Patient verbalized understanding of these instructions and education. Patient Consent Given: Yes Education handout provided: Previously provided Muscles treated: R upper traps and levator, Rhomboid major.  Estim used with low frequency and low hz.  Seated scaption STM to R rhomboids, UT, LS and teres major/minor Manual thoracic decompression  Date  11/02/2022: Discussed frequency of exercise and postural alignment Trigger Point Dry-Needling  Treatment instructions: Expect mild to moderate muscle soreness. S/S of pneumothorax if dry needled over a lung field, and to seek immediate medical attention should they occur. Patient verbalized understanding of these instructions and education. Patient Consent Given: Yes Education handout provided: Previously provided Muscles treated: bil multifidi, bil upper traps and levator Elongation and release to Rt>Lt neck after DN.  Cervical mechanical traction: 18#/5# x 12 minutes 60 seconds/10 seconds     Date 10/29/2022: Discussed HEP and purpose of each exercise Trigger Point Dry-Needling  Treatment instructions: Expect mild to moderate muscle soreness. S/S of pneumothorax if dry needled over a lung field, and to seek immediate medical attention should they occur. Patient verbalized understanding of these instructions and education. Patient Consent Given: Yes Education handout provided: Previously provided Muscles treated: bil multifidi, bil upper traps and levator Elongation and release to Rt>Lt neck after DN.  Cervical mechanical traction: 18#/5# x 12 minutes 60 seconds/10 seconds     PATIENT EDUCATION:  Education details: Educated pt on anatomy and physiology of current symptoms, FOTO, diagnosis, prognosis, HEP,  and POC. Person educated: Patient Education method: Medical illustrator Education comprehension: verbalized understanding and returned demonstration  HOME EXERCISE PROGRAM: Access Code: [email or web address] URL: [email or web address] Date: 10/15/2022 Prepared by: Royal Hawthorn  Exercises - Seated Scapular Retraction  - 2 x daily - 7 x weekly - 2 sets - 10 reps - Seated Upper Trapezius Stretch  - 2 x daily - 7 x weekly - 2 sets - 2 reps - 30 hold - Seated Levator Scapulae Stretch  -  2 x daily - 7 x weekly - 2 sets - 2 reps - 30 hold - Putty Squeezes  - 2 x daily - 7 x  weekly - 2 sets - 10 reps - Median Nerve Tensioner  - 2 x daily - 7 x weekly - 2 sets - 10 reps  ASSESSMENT:  CLINICAL IMPRESSION: Pt will see MD next week to discuss MRI results and is considering spinal injections.  Pt is symptomatic down the Rt arm with neurtral head postural and symptoms immediately resolve with sidebending to the Lt. Pt had to adjust multiple times during exercise due to increased symptoms.  Significant muscle tension in Rt cervical musculature.. Pt is consistent and independent with HEP for strength, posture and flexibility. Pt will continue to benefit from skilled PT to address continued deficits.    OBJECTIVE IMPAIRMENTS: decreased activity tolerance, decreased shoulder mobility, decreased ROM, decreased strength, impaired flexibility, impaired UE use, postural dysfunction, and pain.  ACTIVITY LIMITATIONS: reaching, lifting, carry,  cleaning, driving, and or occupation  PERSONAL FACTORS:  also affecting patient's functional outcome.  REHAB POTENTIAL: Good  CLINICAL DECISION MAKING: Stable/uncomplicated  EVALUATION COMPLEXITY: Low    GOALS: Short term PT Goals Target date: 10/29/2022 Pt will be I and compliant with HEP. Baseline:  Goal status: MET (10/26/22) Pt will decrease pain by 25% overall Baseline: 10% (11/02/22) Goal status: On going   Long term PT goals Target date: 12/03/2022  Pt will improve cervical shoulder AROM to [facility] to improve functional reaching Baseline: Goal status: New Pt will improve bilat shoulder strength to at least 4+/5 MMT to improve functional strength Baseline: Goal status: New Pt will improve FOTO to at least 57% functional to show improved function Baseline: Goal status: New Pt will reduce pain to overall less than <2/10 with usual activity and work activity. Baseline: Goal status: New Pt will be able to hold head in neutral without onset of symptoms.  Baseline: tilts to Lt when she gets symptoms (11/02/22) Goal status: on  going Pt will improve posture with min cues.  Baseline: Goal status: on going  PLAN: PT FREQUENCY: 2x per week   PT DURATION: 6 weeks.   PLANNED INTERVENTIONS (unless contraindicated): aquatic PT, Canalith repositioning, cryotherapy, Electrical stimulation, Iontophoresis with 4 mg/ml dexamethasome, Moist heat, traction, Ultrasound, gait training, Therapeutic exercise, balance training, neuromuscular re-education, patient/family education, prosthetic training, manual techniques, passive ROM, dry needling, taping, vasopnuematic device, vestibular, spinal manipulations, joint manipulations  PLAN FOR NEXT SESSION: continue traction if helpful, DN 1x/wk, emphasize neutral postural alignment and strength, postural strength.  Measure cervical A/ROM  Lorrene Reid, PT 11/12/22 11:41 AM

## 2022-11-16 ENCOUNTER — Other Ambulatory Visit (HOSPITAL_BASED_OUTPATIENT_CLINIC_OR_DEPARTMENT_OTHER): Payer: Self-pay

## 2022-11-17 ENCOUNTER — Ambulatory Visit: Payer: PPO

## 2022-11-17 ENCOUNTER — Other Ambulatory Visit (HOSPITAL_BASED_OUTPATIENT_CLINIC_OR_DEPARTMENT_OTHER): Payer: Self-pay

## 2022-11-17 DIAGNOSIS — R293 Abnormal posture: Secondary | ICD-10-CM

## 2022-11-17 DIAGNOSIS — R252 Cramp and spasm: Secondary | ICD-10-CM

## 2022-11-17 DIAGNOSIS — M6281 Muscle weakness (generalized): Secondary | ICD-10-CM

## 2022-11-17 DIAGNOSIS — M5412 Radiculopathy, cervical region: Secondary | ICD-10-CM

## 2022-11-17 MED ORDER — OZEMPIC (2 MG/DOSE) 8 MG/3ML ~~LOC~~ SOPN
2.0000 mg | PEN_INJECTOR | SUBCUTANEOUS | 3 refills | Status: DC
  Filled 2022-11-17: qty 9, 84d supply, fill #0
  Filled 2023-02-04: qty 9, 84d supply, fill #1
  Filled 2023-04-29: qty 9, 84d supply, fill #2
  Filled 2023-07-22: qty 9, 84d supply, fill #3

## 2022-11-17 NOTE — Therapy (Signed)
OUTPATIENT PHYSICAL THERAPY CERVICAL TREATMENT   Patient Name: [name] MRN: [id number] DOB:12-21-1950, 72 y.o., female Today's Date: 11/17/2022 Progress Note Reporting Period 10/15/22 to 11/17/22  See note below for Objective Data and Assessment of Progress/Goals.     END OF SESSION:  PT End of Session - 11/17/22 1222     Visit Number 10    Date for PT Re-Evaluation 12/03/22    Authorization Type [employer]    Progress Note Due on Visit 20    PT Start Time 1146    PT Stop Time 1230    PT Time Calculation (min) 44 min    Activity Tolerance Patient tolerated treatment well    Behavior During Therapy WFL for tasks assessed/performed                     Past Medical History:  Diagnosis Date   Asthma    has rescue inhaler, now on Flovent   Chronic back pain    Diabetes mellitus without complication (HCC)    on medformin, on Ozempic   GERD (gastroesophageal reflux disease)    History of COVID-19 04/05/2021   Hyperlipemia    Hypertension    Hypothyroidism    Insomnia    melanoma 11/2021   vulva   OAB (overactive bladder)    Seasonal allergies    Sleep apnea    uses a cpap   Past Surgical History:  Procedure Laterality Date   ANKLE ARTHROTOMY  2003   left-fx   APPENDECTOMY     back fusion  02/14/2019   L4 back fusion   BACK SURGERY  2010   lumb disk   BREAST LUMPECTOMY WITH RADIOACTIVE SEED LOCALIZATION Left 06/13/2019   Procedure: LEFT BREAST LUMPECTOMY WITH RADIOACTIVE SEED LOCALIZATION;  Surgeon: Harriette Bouillon, MD;  Location: [facility];  Service: General;  Laterality: Left;   CARPAL TUNNEL RELEASE     rt   CARPAL TUNNEL RELEASE Left 08/01/2013   Procedure: LEFT CARPAL TUNNEL RELEASE;  Surgeon: Nicki Reaper, MD;  Location: [facility];  Service: Orthopedics;  Laterality: Left;   CESAREAN SECTION  83,86   COLONOSCOPY     DILATATION & CURETTAGE/HYSTEROSCOPY WITH MYOSURE N/A 08/23/2018   Procedure:  DILATATION & CURETTAGE/HYSTEROSCOPY WITH MYOSURE POLYPECTOMY;  Surgeon: Patton Salles, MD;  Location: [facility];  Service: Gynecology;  Laterality: N/A;  fibroid resection   DILATION AND CURETTAGE OF UTERUS     x2   MASS EXCISION N/A 08/04/2017   Procedure: EXCISION LIPOMA UPPER BACK x2 AND NECK;  Surgeon: Harriette Bouillon, MD;  Location: [facility];  Service: General;  Laterality: N/A;   NASAL SEPTOPLASTY W/ TURBINOPLASTY Bilateral 06/21/2015   Procedure: NASAL SEPTOPLASTY WITH BILATERAL TURBINATE REDUCTION;  Surgeon: Osborn Coho, MD;  Location: [facility];  Service: ENT;  Laterality: Bilateral;   SINOSCOPY     TONSILLECTOMY     TUBAL LIGATION     VULVECTOMY N/A 12/24/2021   Procedure: WIDE EXCISION VULVECTOMY;  Surgeon: Carver Fila, MD;  Location: [facility];  Service: Gynecology;  Laterality: N/A;   Patient Active Problem List   Diagnosis Date Noted   Melanoma in situ (HCC) 07/17/2022   Hypothyroidism 01/22/2022   Insomnia 01/22/2022   Lipoma 01/22/2022   Major depression, single episode 01/22/2022   Morbid obesity (HCC) 01/22/2022   Overactive bladder 01/22/2022   Type 2 diabetes mellitus without complications (HCC) 01/22/2022  Allergic rhinitis due to pollen 01/22/2022   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HCC) 08/30/2021   BMI 37.0-37.9, adult 08/30/2021   Spondylolisthesis of lumbosacral region 02/14/2019   Mild persistent asthma without complication 06/10/2018   Mixed rhinitis 06/10/2018   Obstructive sleep apnea 12/11/2016   Palpitations 06/17/2016   Deflected nasal septum 09/13/2015   Hypertrophy of nasal turbinates 09/13/2015   Deviated nasal septum 06/21/2015    Class: Chronic   HYPERLIPIDEMIA 11/18/2007   Essential hypertension 11/18/2007   Allergic rhinitis 11/18/2007   COUGH 11/18/2007    PCP: Camie Patience, FNP   REFERRING PROVIDER: William Hamburger, PA  REFERRING DIAG:  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  I still have to tip my head to the Lt to reduce arm symptoms.  I see the MD on Thursday.   Pt states that she had an insidious onset of tingling in her R UE. She reports no know MOI. Pt to receive an MRI of lower back and neck on April 27th. Pt is able to reduce symptoms with head tilt to the L.  Hand dominance: Right  PERTINENT HISTORY:  Asthma, chronic back pain, DM, GERD, HTN, hypothyroidism, Sleep apnea.   PAIN:  Are you having pain? Yes: NPRS scale: 4/10 Pain location: R upper extremity into hand.  Pain description: N/T Aggravating factors: Head in neutral position, sleeping positions, in the morning  Relieving factors: Tilting head to Lt , dry needling, traction, home TENs.  PRECAUTIONS: None   WEIGHT BEARING RESTRICTIONS: No   FALLS:  Has patient fallen in last 6 months? No   LIVING ENVIRONMENT: Lives with: lives with their family Lives in: House/apartment   OCCUPATION: retired Engineer, civil (consulting)    PLOF: Independent, Independent with basic ADLs, Independent with household mobility without device, Independent with community mobility without device, Independent with gait, and Independent with transfers   PATIENT GOALS: To eliminate neck pain and avoid further problems.    NEXT MD VISIT: as needed  OBJECTIVE:   DIAGNOSTIC FINDINGS:  None recently.   PATIENT SURVEYS:  FOTO  45.49% 11/17/22: 57 (goal met so removed from FOTO)  COGNITION: Overall cognitive status:  Within functional limits for tasks assessed  SENSATION: N/T into thumb and index finger.   POSTURE: rounded shoulders, forward head, and Head tilt to L.   PALPATION: Tenderness to R cervical paraspinals and into UT. Pt reports her index finger being very itchy.    CERVICAL ROM:   Active ROM A/PROM (deg) eval A/ROM 11/17/22  Flexion 56   Extension 18 with numbness, tingling   Right lateral flexion 40 35   Left lateral flexion 33 with numbness, tingling and stiffness reported. 40  Right rotation 26 tight, numbness, tingling 40  Left rotation 45 50   (Blank rows = not tested)  UPPER EXTREMITY ROM:  Active ROM Right eval Left eval  Shoulder flexion [facility]  Shoulder abduction [facility]  Shoulder internal rotation [facility]  Shoulder external rotation WFL WFL   (Blank rows = not tested)  UPPER EXTREMITY MMT:  MMT Right eval Left eval  Shoulder flexion 4- 4-  Shoulder abduction 4- 4-  Shoulder internal rotation 4 4  Shoulder external rotation 4 4  Grip strength 50 53   (Blank rows = not tested) Grip: Lt 60#, Rt 44#  CERVICAL SPECIAL TESTS:  Upper limb tension test (ULTT): Positive and Spurling's test: Positive  TODAY'S TREATMENT: Date 11/17/2022: FOTO and cervical A/ROM measures  Trigger Point Dry-Needling  Treatment instructions: Expect mild to moderate muscle soreness. S/S of pneumothorax if dry needled over a lung field, and to seek immediate medical attention should they occur. Patient verbalized understanding of these instructions and education. Patient Consent Given: Yes Education handout provided: Previously provided Muscles treated: bil multifidi, bil upper traps and levator Elongation and release to Rt>Lt neck after DN.  Cervical mechanical traction: 18#/5# x 12 minutes 60 seconds/10 seconds    Date 11/12/2022: Arm bike: Level 1.4 x 6 minutes (3/3)-discussed progress with PT Supine cervical retraction 5" hold x5 Seated scaption x10-tingling in Rt  UE Thoracic decompression- increased Rt UE symptoms despite multiple alignment changes  Manual: cervical mobs with movement and elongation to Rt upper traps and neck Cervical mechanical traction: 18#/5# x 12 minutes 60 seconds/10 seconds  Date 11/10/2022: Discussed and reviewed MRI results.  Trigger Point Dry-Needling  Treatment instructions: Expect mild to moderate muscle soreness. S/S of pneumothorax if dry needled over a lung field, and to seek immediate medical attention should they occur. Patient verbalized understanding of these instructions and education. Patient Consent Given: Yes Education handout provided: Previously provided Muscles treated: R upper traps and levator, Rhomboid major.  Estim used with low frequency and low hz.  Seated scaption STM to R rhomboids, UT, LS and teres major/minor Manual thoracic decompression   PATIENT EDUCATION:  Education details: Educated pt on anatomy and physiology of current symptoms, FOTO, diagnosis, prognosis, HEP,  and POC. Person educated: Patient Education method: Medical illustrator Education comprehension: verbalized understanding and returned demonstration  HOME EXERCISE PROGRAM: Access Code: [email or web address] URL: [email or web address] Date: 10/15/2022 Prepared by: Royal Hawthorn  Exercises - Seated Scapular Retraction  - 2 x daily - 7 x weekly - 2 sets - 10 reps - Seated Upper Trapezius Stretch  - 2 x daily - 7 x weekly - 2 sets - 2 reps - 30 hold - Seated Levator Scapulae Stretch  - 2 x daily - 7 x weekly - 2 sets - 2 reps - 30 hold - Putty Squeezes  - 2 x daily - 7 x weekly -  2 sets - 10 reps - Median Nerve Tensioner  - 2 x daily - 7 x weekly - 2 sets - 10 reps  ASSESSMENT:  CLINICAL IMPRESSION: Pt will see MD next week to discuss MRI results and is considering spinal injections.  Pt is symptomatic down the Rt arm with neurtral head postural and symptoms immediately resolve with sidebending to the Lt. Overall,  cervical a/ROM is improved.  Pt reports 10% overall reduction in symptoms since the start of care.  Rt grip is 44#, Lt is 60#.  Significant muscle tension in Rt cervical musculature and pt had good response to DN with twitch and improved tissue mobility.  FOTO goal is met. . Pt is consistent and independent with HEP for strength, posture and flexibility. Pt will continue to benefit from skilled PT to address continued deficits.    OBJECTIVE IMPAIRMENTS: decreased activity tolerance, decreased shoulder mobility, decreased ROM, decreased strength, impaired flexibility, impaired UE use, postural dysfunction, and pain.  ACTIVITY LIMITATIONS: reaching, lifting, carry,  cleaning, driving, and or occupation  PERSONAL FACTORS:  also affecting patient's functional outcome.  REHAB POTENTIAL: Good  CLINICAL DECISION MAKING: Stable/uncomplicated  EVALUATION COMPLEXITY: Low    GOALS: Short term PT Goals Target date: 10/29/2022 Pt will be I and compliant with HEP. Baseline:  Goal status: MET (10/26/22) Pt will decrease pain by 25% overall Baseline: 10% (11/02/22) Goal status: On going   Long term PT goals Target date: 12/03/2022  Pt will improve cervical shoulder [facility]OM to [facility] to improve functional reaching Baseline: see above (11/17/22) Goal status: in progress  Pt will improve bilat shoulder strength to at least 4+/5 MMT to improve functional strength Baseline: Goal status: in progress  Pt will improve FOTO to at least 57% functional to show improved function Baseline: 57 (11/17/22) Goal status: New Pt will reduce pain to overall less than <2/10 with usual activity and work activity. Baseline: up to 6/10 (11/17/22) Goal status: in progress  Pt will be able to hold head in neutral without onset of symptoms.  Baseline: tilts to Lt when she gets symptoms (11/02/22) Goal status: on going Pt will improve posture with min cues.  Baseline: Goal status: on going  PLAN: PT FREQUENCY: 2x per week    PT DURATION: 6 weeks.   PLANNED INTERVENTIONS (unless contraindicated): aquatic PT, Canalith repositioning, cryotherapy, Electrical stimulation, Iontophoresis with 4 mg/ml dexamethasome, Moist heat, traction, Ultrasound, gait training, Therapeutic exercise, balance training, neuromuscular re-education, patient/family education, prosthetic training, manual techniques, passive ROM, dry needling, taping, vasopnuematic device, vestibular, spinal manipulations, joint manipulations  PLAN FOR NEXT SESSION: continue traction if helpful, DN 1x/wk, emphasize neutral postural alignment and strength, postural strength.   Lorrene Reid, PT 11/17/22 12:23 PM

## 2022-11-18 ENCOUNTER — Encounter (HOSPITAL_COMMUNITY): Payer: Self-pay

## 2022-11-18 ENCOUNTER — Ambulatory Visit (HOSPITAL_COMMUNITY)
Admission: RE | Admit: 2022-11-18 | Discharge: 2022-11-18 | Disposition: A | Discharge status: Home / Self Care | Payer: PPO | Source: Ambulatory Visit | Attending: Gynecologic Oncology | Admitting: Gynecologic Oncology

## 2022-11-18 DIAGNOSIS — C519 Malignant neoplasm of vulva, unspecified: Secondary | ICD-10-CM | POA: Diagnosis not present

## 2022-11-18 DIAGNOSIS — J9811 Atelectasis: Secondary | ICD-10-CM | POA: Diagnosis not present

## 2022-11-18 DIAGNOSIS — R918 Other nonspecific abnormal finding of lung field: Secondary | ICD-10-CM | POA: Diagnosis not present

## 2022-11-18 DIAGNOSIS — K3189 Other diseases of stomach and duodenum: Secondary | ICD-10-CM | POA: Diagnosis not present

## 2022-11-18 LAB — POCT I-STAT CREATININE: Creatinine, Ser: 0.8 mg/dL (ref 0.44–1.00)

## 2022-11-18 MED ORDER — IOHEXOL 9 MG/ML PO SOLN
1000.0000 mL | ORAL | Status: AC
  Administered 2022-11-18: 1000 mL via ORAL

## 2022-11-18 MED ORDER — SODIUM CHLORIDE (PF) 0.9 % IJ SOLN
INTRAMUSCULAR | Status: AC
  Filled 2022-11-18: qty 50

## 2022-11-18 MED ORDER — IOHEXOL 300 MG/ML  SOLN
100.0000 mL | Freq: Once | INTRAMUSCULAR | Status: AC | PRN
  Administered 2022-11-18: 100 mL via INTRAVENOUS

## 2022-11-19 ENCOUNTER — Ambulatory Visit: Payer: PPO

## 2022-11-19 DIAGNOSIS — R252 Cramp and spasm: Secondary | ICD-10-CM

## 2022-11-19 DIAGNOSIS — M6281 Muscle weakness (generalized): Secondary | ICD-10-CM

## 2022-11-19 DIAGNOSIS — R293 Abnormal posture: Secondary | ICD-10-CM

## 2022-11-19 DIAGNOSIS — M4722 Other spondylosis with radiculopathy, cervical region: Secondary | ICD-10-CM | POA: Diagnosis not present

## 2022-11-19 DIAGNOSIS — M5412 Radiculopathy, cervical region: Secondary | ICD-10-CM

## 2022-11-19 NOTE — Therapy (Signed)
OUTPATIENT PHYSICAL THERAPY CERVICAL TREATMENT   Patient Name: [name] MRN: [id number] DOB:02-Jun-1951, 72 y.o., female Today's Date: 11/19/2022 Progress Note Reporting Period 10/15/22 to 11/17/22  See note below for Objective Data and Assessment of Progress/Goals.     END OF SESSION:  PT End of Session - 11/19/22 1309     Visit Number 11    Date for PT Re-Evaluation 12/03/22    Authorization Type [employer]    Progress Note Due on Visit 20    PT Start Time 1234    PT Stop Time 1318    PT Time Calculation (min) 44 min    Activity Tolerance Patient tolerated treatment well    Behavior During Therapy WFL for tasks assessed/performed                      Past Medical History:  Diagnosis Date   Asthma    has rescue inhaler, now on Flovent   Chronic back pain    Diabetes mellitus without complication (HCC)    on medformin, on Ozempic   GERD (gastroesophageal reflux disease)    History of COVID-19 04/05/2021   Hyperlipemia    Hypertension    Hypothyroidism    Insomnia    melanoma 11/2021   vulva   OAB (overactive bladder)    Seasonal allergies    Sleep apnea    uses a cpap   Past Surgical History:  Procedure Laterality Date   ANKLE ARTHROTOMY  2003   left-fx   APPENDECTOMY     back fusion  02/14/2019   L4 back fusion   BACK SURGERY  2010   lumb disk   BREAST LUMPECTOMY WITH RADIOACTIVE SEED LOCALIZATION Left 06/13/2019   Procedure: LEFT BREAST LUMPECTOMY WITH RADIOACTIVE SEED LOCALIZATION;  Surgeon: Harriette Bouillon, MD;  Location: [facility];  Service: General;  Laterality: Left;   CARPAL TUNNEL RELEASE     rt   CARPAL TUNNEL RELEASE Left 08/01/2013   Procedure: LEFT CARPAL TUNNEL RELEASE;  Surgeon: Nicki Reaper, MD;  Location: [facility];  Service: Orthopedics;  Laterality: Left;   CESAREAN SECTION  83,86   COLONOSCOPY     DILATATION & CURETTAGE/HYSTEROSCOPY WITH MYOSURE N/A 08/23/2018   Procedure:  DILATATION & CURETTAGE/HYSTEROSCOPY WITH MYOSURE POLYPECTOMY;  Surgeon: Patton Salles, MD;  Location: [facility];  Service: Gynecology;  Laterality: N/A;  fibroid resection   DILATION AND CURETTAGE OF UTERUS     x2   MASS EXCISION N/A 08/04/2017   Procedure: EXCISION LIPOMA UPPER BACK x2 AND NECK;  Surgeon: Harriette Bouillon, MD;  Location: [facility];  Service: General;  Laterality: N/A;   NASAL SEPTOPLASTY W/ TURBINOPLASTY Bilateral 06/21/2015   Procedure: NASAL SEPTOPLASTY WITH BILATERAL TURBINATE REDUCTION;  Surgeon: Osborn Coho, MD;  Location: [facility];  Service: ENT;  Laterality: Bilateral;   SINOSCOPY     TONSILLECTOMY     TUBAL LIGATION     VULVECTOMY N/A 12/24/2021   Procedure: WIDE EXCISION VULVECTOMY;  Surgeon: Carver Fila, MD;  Location: [facility];  Service: Gynecology;  Laterality: N/A;   Patient Active Problem List   Diagnosis Date Noted   Melanoma in situ (HCC) 07/17/2022   Hypothyroidism 01/22/2022   Insomnia 01/22/2022   Lipoma 01/22/2022   Major depression, single episode 01/22/2022   Morbid obesity (HCC) 01/22/2022   Overactive bladder 01/22/2022   Type 2 diabetes mellitus without complications (HCC) 01/22/2022  Allergic rhinitis due to pollen 01/22/2022   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HCC) 08/30/2021   BMI 37.0-37.9, adult 08/30/2021   Spondylolisthesis of lumbosacral region 02/14/2019   Mild persistent asthma without complication 06/10/2018   Mixed rhinitis 06/10/2018   Obstructive sleep apnea 12/11/2016   Palpitations 06/17/2016   Deflected nasal septum 09/13/2015   Hypertrophy of nasal turbinates 09/13/2015   Deviated nasal septum 06/21/2015    Class: Chronic   HYPERLIPIDEMIA 11/18/2007   Essential hypertension 11/18/2007   Allergic rhinitis 11/18/2007   COUGH 11/18/2007    PCP: Camie Patience, FNP   REFERRING PROVIDER: William Hamburger, PA  REFERRING DIAG:  [phone] (ICD-10-CM) - Brachial  neuritis  M54.16 (ICD-10-CM) - Lumbar radiculopathy    THERAPY DIAG:  Abnormal posture  Muscle weakness (generalized)  Radiculopathy, cervical region  Cramp and spasm  Rationale for Evaluation and Treatment: Rehabilitation  ONSET DATE: 3+ months    SUBJECTIVE:                                                                                                                                                                                                         SUBJECTIVE STATEMENT: I saw the PA today.  I am scheduled for an injection in June.    Pt states that she had an insidious onset of tingling in her R UE. She reports no know MOI. Pt to receive an MRI of lower back and neck on April 27th. Pt is able to reduce symptoms with head tilt to the L.  Hand dominance: Right  PERTINENT HISTORY:  Asthma, chronic back pain, DM, GERD, HTN, hypothyroidism, Sleep apnea.   PAIN:  Are you having pain? Yes: NPRS scale: 4/10 Pain location: R upper extremity into hand.  Pain description: N/T Aggravating factors: Head in neutral position, sleeping positions, in the morning  Relieving factors: Tilting head to Lt , dry needling, traction, home TENs.  PRECAUTIONS: None   WEIGHT BEARING RESTRICTIONS: No   FALLS:  Has patient fallen in last 6 months? No   LIVING ENVIRONMENT: Lives with: lives with their family Lives in: House/apartment   OCCUPATION: retired Engineer, civil (consulting)    PLOF: Independent, Independent with basic ADLs, Independent with household mobility without device, Independent with community mobility without device, Independent with gait, and Independent with transfers   PATIENT GOALS: To eliminate neck pain and avoid further problems.    NEXT MD VISIT: as needed  OBJECTIVE:   DIAGNOSTIC FINDINGS:  None recently.   PATIENT SURVEYS:  FOTO 45.49% 11/17/22: 57 (goal met so removed from FOTO)  COGNITION: Overall  cognitive status: Within functional limits for tasks  assessed  SENSATION: N/T into thumb and index finger.   POSTURE: rounded shoulders, forward head, and Head tilt to L.   PALPATION: Tenderness to R cervical paraspinals and into UT. Pt reports her index finger being very itchy.    CERVICAL ROM:   Active ROM A/PROM (deg) eval A/ROM 11/17/22  Flexion 56   Extension 18 with numbness, tingling   Right lateral flexion 40 35   Left lateral flexion 33 with numbness, tingling and stiffness reported. 40  Right rotation 26 tight, numbness, tingling 40  Left rotation 45 50   (Blank rows = not tested)  UPPER EXTREMITY ROM:  Active ROM Right eval Left eval  Shoulder flexion [facility]  Shoulder abduction [facility]  Shoulder internal rotation [facility]  Shoulder external rotation WFL WFL   (Blank rows = not tested)  UPPER EXTREMITY MMT:  MMT Right eval Left eval  Shoulder flexion 4- 4-  Shoulder abduction 4- 4-  Shoulder internal rotation 4 4  Shoulder external rotation 4 4  Grip strength 50 53   (Blank rows = not tested) Grip: Lt 60#, Rt 44#  CERVICAL SPECIAL TESTS:  Upper limb tension test (ULTT): Positive and Spurling's test: Positive  TODAY'S TREATMENT: Date 11/19/2022: Arm bike: Level 1.4 x 6 minutes (3/3)-discussed progress with PT Red digiflex: mass grip x2  Shoulder flexion: flexion with 1# x15, scaption: x15 Red band: shoulder extension and rowing: 2x10   Cervical mechanical traction: 14#/5# x 12 minutes- static hold using Saunders Traction Unit  Date 11/17/2022: FOTO and cervical A/ROM measures  Trigger Point Dry-Needling  Treatment instructions: Expect mild to moderate muscle soreness. S/S of pneumothorax if dry needled over a lung field, and to seek immediate medical attention should they occur. Patient verbalized understanding of these instructions and education. Patient Consent Given: Yes Education handout provided: Previously provided Muscles treated: bil multifidi, bil upper traps and levator Elongation and  release to Rt>Lt neck after DN.  Cervical mechanical traction: 18#/5# x 12 minutes 60 seconds/10 seconds    Date 11/12/2022: Arm bike: Level 1.4 x 6 minutes (3/3)-discussed progress with PT Supine cervical retraction 5" hold x5 Seated scaption x10-tingling in Rt UE Thoracic decompression- increased Rt UE symptoms despite multiple alignment changes  Manual: cervical mobs with movement and elongation to Rt upper traps and neck Cervical mechanical traction: 18#/5# x 12 minutes 60 seconds/10 seconds   PATIENT EDUCATION:  Education details: Educated pt on anatomy and physiology of current symptoms, FOTO, diagnosis, prognosis, HEP,  and POC. Person educated: Patient Education method: Medical illustrator Education comprehension: verbalized understanding and returned demonstration  HOME EXERCISE PROGRAM: Access Code: [email or web address] URL: [email or web address] Date: 11/19/2022 Prepared by: Tresa Endo  Exercises - Seated Scapular Retraction  - 2 x daily - 7 x weekly - 2 sets - 10 reps - Seated Upper Trapezius Stretch  - 2 x daily - 7 x weekly - 2 sets - 2 reps - 30 hold - Seated Levator Scapulae Stretch  - 2 x daily - 7 x weekly - 2 sets - 2 reps - 30 hold - Putty Squeezes  - 2 x daily - 7 x weekly - 2 sets - 10 reps - Median Nerve Tensioner  - 2 x daily - 7 x weekly - 2 sets - 10 reps - Shoulder extension with resistance - Neutral  - 1 x daily - 7 x weekly - 2 sets - 10 reps - Standing Row with Anchored Resistance  -  1 x daily - 7 x weekly - 2 sets - 10 reps  ASSESSMENT:  CLINICAL IMPRESSION: Pt is going to have injection into neck in June.   Traction is helping to keep her UE symptoms at bay.   Rt grip is 44#, Lt is 60# this week.  Pt tolerated strengthening well today and PT added to HEP.  Pt is consistent and independent with HEP for strength, posture and flexibility. Pt will continue to benefit from skilled PT to address continued deficits.    OBJECTIVE IMPAIRMENTS:  decreased activity tolerance, decreased shoulder mobility, decreased ROM, decreased strength, impaired flexibility, impaired UE use, postural dysfunction, and pain.  ACTIVITY LIMITATIONS: reaching, lifting, carry,  cleaning, driving, and or occupation  PERSONAL FACTORS:  also affecting patient's functional outcome.  REHAB POTENTIAL: Good  CLINICAL DECISION MAKING: Stable/uncomplicated  EVALUATION COMPLEXITY: Low    GOALS: Short term PT Goals Target date: 10/29/2022 Pt will be I and compliant with HEP. Baseline:  Goal status: MET (10/26/22) Pt will decrease pain by 25% overall Baseline: 10% (11/02/22) Goal status: On going   Long term PT goals Target date: 12/03/2022  Pt will improve cervical shoulder AROM to [facility] to improve functional reaching Baseline: see above (11/17/22) Goal status: in progress  Pt will improve bilat shoulder strength to at least 4+/5 MMT to improve functional strength Baseline: Goal status: in progress  Pt will improve FOTO to at least 57% functional to show improved function Baseline: 57 (11/17/22) Goal status: New Pt will reduce pain to overall less than <2/10 with usual activity and work activity. Baseline: up to 6/10 (11/17/22) Goal status: in progress  Pt will be able to hold head in neutral without onset of symptoms.  Baseline: tilts to Lt when she gets symptoms (11/02/22) Goal status: on going Pt will improve posture with min cues.  Baseline: Goal status: on going  PLAN: PT FREQUENCY: 2x per week   PT DURATION: 6 weeks.   PLANNED INTERVENTIONS (unless contraindicated): aquatic PT, Canalith repositioning, cryotherapy, Electrical stimulation, Iontophoresis with 4 mg/ml dexamethasome, Moist heat, traction, Ultrasound, gait training, Therapeutic exercise, balance training, neuromuscular re-education, patient/family education, prosthetic training, manual techniques, passive ROM, dry needling, taping, vasopnuematic device, vestibular, spinal  manipulations, joint manipulations  PLAN FOR NEXT SESSION: continue traction if helpful, DN 1x/wk, emphasize neutral postural alignment and strength, postural strength.  ERO next week. Lorrene Reid, PT 11/19/22 1:10 PM

## 2022-11-23 ENCOUNTER — Ambulatory Visit: Payer: PPO

## 2022-11-23 DIAGNOSIS — M5412 Radiculopathy, cervical region: Secondary | ICD-10-CM

## 2022-11-23 DIAGNOSIS — R293 Abnormal posture: Secondary | ICD-10-CM

## 2022-11-23 DIAGNOSIS — R252 Cramp and spasm: Secondary | ICD-10-CM

## 2022-11-23 DIAGNOSIS — M6281 Muscle weakness (generalized): Secondary | ICD-10-CM

## 2022-11-23 NOTE — Therapy (Signed)
OUTPATIENT PHYSICAL THERAPY CERVICAL TREATMENT   Patient Name: [name] MRN: [id number] DOB:[birth year], 72 y.o., female Today's Date: 11/23/2022 Progress Note Reporting Period 10/15/22 to 11/17/22  See note below for Objective Data and Assessment of Progress/Goals.     END OF SESSION:  PT End of Session - 11/23/22 1304     Visit Number 12    Date for PT Re-Evaluation 12/03/22    Authorization Type [employer]    Progress Note Due on Visit 20    PT Start Time 1234    PT Stop Time 1312    PT Time Calculation (min) 38 min    Activity Tolerance Patient tolerated treatment well    Behavior During Therapy WFL for tasks assessed/performed                       Past Medical History:  Diagnosis Date   Asthma    has rescue inhaler, now on Flovent   Chronic back pain    Diabetes mellitus without complication (HCC)    on medformin, on Ozempic   GERD (gastroesophageal reflux disease)    History of COVID-19 04/05/2021   Hyperlipemia    Hypertension    Hypothyroidism    Insomnia    melanoma 11/2021   vulva   OAB (overactive bladder)    Seasonal allergies    Sleep apnea    uses a cpap   Past Surgical History:  Procedure Laterality Date   ANKLE ARTHROTOMY  2003   left-fx   APPENDECTOMY     back fusion  02/14/2019   L4 back fusion   BACK SURGERY  2010   lumb disk   BREAST LUMPECTOMY WITH RADIOACTIVE SEED LOCALIZATION Left 06/13/2019   Procedure: LEFT BREAST LUMPECTOMY WITH RADIOACTIVE SEED LOCALIZATION;  Surgeon: Harriette Bouillon, MD;  Location: [facility];  Service: General;  Laterality: Left;   CARPAL TUNNEL RELEASE     rt   CARPAL TUNNEL RELEASE Left 08/01/2013   Procedure: LEFT CARPAL TUNNEL RELEASE;  Surgeon: Nicki Reaper, MD;  Location: [facility];  Service: Orthopedics;  Laterality: Left;   CESAREAN SECTION  83,86   COLONOSCOPY     DILATATION & CURETTAGE/HYSTEROSCOPY WITH MYOSURE N/A 08/23/2018   Procedure:  DILATATION & CURETTAGE/HYSTEROSCOPY WITH MYOSURE POLYPECTOMY;  Surgeon: Patton Salles, MD;  Location: [facility];  Service: Gynecology;  Laterality: N/A;  fibroid resection   DILATION AND CURETTAGE OF UTERUS     x2   MASS EXCISION N/A 08/04/2017   Procedure: EXCISION LIPOMA UPPER BACK x2 AND NECK;  Surgeon: Harriette Bouillon, MD;  Location: [facility];  Service: General;  Laterality: N/A;   NASAL SEPTOPLASTY W/ TURBINOPLASTY Bilateral 06/21/2015   Procedure: NASAL SEPTOPLASTY WITH BILATERAL TURBINATE REDUCTION;  Surgeon: Osborn Coho, MD;  Location: [facility] OR;  Service: ENT;  Laterality: Bilateral;   SINOSCOPY     TONSILLECTOMY     TUBAL LIGATION     VULVECTOMY N/A 12/24/2021   Procedure: WIDE EXCISION VULVECTOMY;  Surgeon: Carver Fila, MD;  Location: [facility];  Service: Gynecology;  Laterality: N/A;   Patient Active Problem List   Diagnosis Date Noted   Melanoma in situ (HCC) 07/17/2022   Hypothyroidism 01/22/2022   Insomnia 01/22/2022   Lipoma 01/22/2022   Major depression, single episode 01/22/2022   Morbid obesity (HCC) 01/22/2022   Overactive bladder 01/22/2022   Type 2 diabetes mellitus without complications (HCC) 01/22/2022  Allergic rhinitis due to pollen 01/22/2022   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HCC) 08/30/2021   BMI 37.0-37.9, adult 08/30/2021   Spondylolisthesis of lumbosacral region 02/14/2019   Mild persistent asthma without complication 06/10/2018   Mixed rhinitis 06/10/2018   Obstructive sleep apnea 12/11/2016   Palpitations 06/17/2016   Deflected nasal septum 09/13/2015   Hypertrophy of nasal turbinates 09/13/2015   Deviated nasal septum 06/21/2015    Class: Chronic   HYPERLIPIDEMIA 11/18/2007   Essential hypertension 11/18/2007   Allergic rhinitis 11/18/2007   COUGH 11/18/2007    PCP: Camie Patience, FNP   REFERRING PROVIDER: William Hamburger, PA  REFERRING DIAG:  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    Pt states that she had an insidious onset of tingling in her R UE. She reports no know MOI. Pt to receive an MRI of lower back and neck on April 27th. Pt is able to reduce symptoms with head tilt to the L.  Hand dominance: Right  PERTINENT HISTORY:  Asthma, chronic back pain, DM, GERD, HTN, hypothyroidism, Sleep apnea.   PAIN:  Are you having pain? Yes: NPRS scale: 0-4/10 Pain location: R upper extremity into hand.  Pain description: N/T Aggravating factors: Head in neutral position, sleeping positions, in the morning  Relieving factors: Tilting head to Lt , dry needling, traction, home TENs.  PRECAUTIONS: None   WEIGHT BEARING RESTRICTIONS: No   FALLS:  Has patient fallen in last 6 months? No   LIVING ENVIRONMENT: Lives with: lives with their family Lives in: House/apartment   OCCUPATION: retired Engineer, civil (consulting)    PLOF: Independent, Independent with basic ADLs, Independent with household mobility without device, Independent with community mobility without device, Independent with gait, and Independent with transfers   PATIENT GOALS: To eliminate neck pain and avoid further problems.    NEXT MD VISIT: as needed  OBJECTIVE:   DIAGNOSTIC FINDINGS:  None recently.   PATIENT SURVEYS:  FOTO 45.49% 11/17/22: 57 (goal met so removed from FOTO)  COGNITION: Overall cognitive status: Within functional  limits for tasks  assessed  SENSATION: N/T into thumb and index finger.   POSTURE: rounded shoulders, forward head, and Head tilt to L.   PALPATION: Tenderness to R cervical paraspinals and into UT. Pt reports her index finger being very itchy.    CERVICAL ROM:   Active ROM A/PROM (deg) eval A/ROM 11/17/22  Flexion 56   Extension 18 with numbness, tingling   Right lateral flexion 40 35   Left lateral flexion 33 with numbness, tingling and stiffness reported. 40  Right rotation 26 tight, numbness, tingling 40  Left rotation 45 50   (Blank rows = not tested)  UPPER EXTREMITY ROM:  Active ROM Right eval Left eval  Shoulder flexion [facility]  Shoulder abduction [facility]  Shoulder internal rotation [facility]  Shoulder external rotation WFL WFL   (Blank rows = not tested)  UPPER EXTREMITY MMT:  MMT Right eval Left eval  Shoulder flexion 4- 4-  Shoulder abduction 4- 4-  Shoulder internal rotation 4 4  Shoulder external rotation 4 4  Grip strength 50 53   (Blank rows = not tested) Grip: Lt 60#, Rt 44#  CERVICAL SPECIAL TESTS:  Upper limb tension test (ULTT): Positive and Spurling's test: Positive  TODAY'S TREATMENT: Date 11/23/2022: Arm bike: Level 1.4 x 6 minutes (3/3)-discussed progress with PT Red digiflex: mass grip x2  Shoulder flexion: flexion with 1# 2x10, scaption: 2x10 Red band: shoulder extension and rowing: 2x10- moderate tactile cues for scapular depression  Cervical mechanical traction: 15#/5# x 12 minutes- 60 sec/10 seconds   Date 11/19/2022: Arm bike: Level 1.4 x 6 minutes (3/3)-discussed progress with PT Red digiflex: mass grip x2  Shoulder flexion: flexion with 1# x15, scaption: x15 Red band: shoulder extension and rowing: 2x10  Cervical mechanical traction: 14#/5# x 12 minutes-  static hold using Saunders Traction Unit  Date 11/17/2022: FOTO and cervical A/ROM measures  Trigger Point Dry-Needling  Treatment instructions: Expect mild to moderate muscle soreness.  S/S of pneumothorax if dry needled over a lung field, and to seek immediate medical attention should they occur. Patient verbalized understanding of these instructions and education. Patient Consent Given: Yes Education handout provided: Previously provided Muscles treated: bil multifidi, bil upper traps and levator Elongation and release to Rt>Lt neck after DN.  Cervical mechanical traction: 18#/5# x 12 minutes 60 seconds/10 seconds    Date 11/12/2022: Arm bike: Level 1.4 x 6 minutes (3/3)-discussed progress with PT Supine cervical retraction 5" hold x5 Seated scaption x10-tingling in Rt UE Thoracic decompression- increased Rt UE symptoms despite multiple alignment changes  Manual: cervical mobs with movement and elongation to Rt upper traps and neck Cervical mechanical traction: 18#/5# x 12 minutes 60 seconds/10 seconds   PATIENT EDUCATION:  Education details: Educated pt on anatomy and physiology of current symptoms, FOTO, diagnosis, prognosis, HEP,  and POC. Person educated: Patient Education method: Medical illustrator Education comprehension: verbalized understanding and returned demonstration  HOME EXERCISE PROGRAM: Access Code: [email or web address] URL: [email or web address] Date: 11/19/2022 Prepared by: Tresa Endo  Exercises - Seated Scapular Retraction  - 2 x daily - 7 x weekly - 2 sets - 10 reps - Seated Upper Trapezius Stretch  - 2 x daily - 7 x weekly - 2 sets - 2 reps - 30 hold - Seated Levator Scapulae Stretch  - 2 x daily - 7 x weekly - 2 sets - 2 reps - 30 hold - Putty Squeezes  - 2 x daily - 7 x weekly - 2 sets -  10 reps - Median Nerve Tensioner  - 2 x daily - 7 x weekly - 2 sets - 10 reps - Shoulder extension with resistance - Neutral  - 1 x daily - 7 x weekly - 2 sets - 10 reps - Standing Row with Anchored Resistance  - 1 x daily - 7 x weekly - 2 sets - 10 reps  ASSESSMENT:  CLINICAL IMPRESSION: Pt is going to have injection into neck in June.    Traction is helping to keep her UE symptoms at bay.  She found a Saunders traction unit online that she hopes to buy.  Pt tolerated strengthening well today and PT provided moderate tactile cues for scapular depression and speed of movement. Pt is consistent and independent with HEP for strength, posture and flexibility. Pt will continue to benefit from skilled PT to address continued deficits.    OBJECTIVE IMPAIRMENTS: decreased activity tolerance, decreased shoulder mobility, decreased ROM, decreased strength, impaired flexibility, impaired UE use, postural dysfunction, and pain.  ACTIVITY LIMITATIONS: reaching, lifting, carry,  cleaning, driving, and or occupation  PERSONAL FACTORS:  also affecting patient's functional outcome.  REHAB POTENTIAL: Good  CLINICAL DECISION MAKING: Stable/uncomplicated  EVALUATION COMPLEXITY: Low    GOALS: Short term PT Goals Target date: 10/29/2022 Pt will be I and compliant with HEP. Baseline:  Goal status: MET (10/26/22) Pt will decrease pain by 25% overall Baseline: 10% (11/02/22) Goal status: On going   Long term PT goals Target date: 12/03/2022  Pt will improve cervical shoulder AROM to [facility] to improve functional reaching Baseline: see above (11/17/22) Goal status: in progress  Pt will improve bilat shoulder strength to at least 4+/5 MMT to improve functional strength Baseline: Goal status: in progress  Pt will improve FOTO to at least 57% functional to show improved function Baseline: 57 (11/17/22) Goal status: New Pt will reduce pain to overall less than <2/10 with usual activity and work activity. Baseline: up to 6/10 (11/17/22) Goal status: in progress  Pt will be able to hold head in neutral without onset of symptoms.  Baseline: tilts to Lt when she gets symptoms (11/02/22) Goal status: on going Pt will improve posture with min cues.  Baseline: Goal status: on going  PLAN: PT FREQUENCY: 2x per week   PT DURATION: 6 weeks.   PLANNED  INTERVENTIONS (unless contraindicated): aquatic PT, Canalith repositioning, cryotherapy, Electrical stimulation, Iontophoresis with 4 mg/ml dexamethasome, Moist heat, traction, Ultrasound, gait training, Therapeutic exercise, balance training, neuromuscular re-education, patient/family education, prosthetic training, manual techniques, passive ROM, dry needling, taping, vasopnuematic device, vestibular, spinal manipulations, joint manipulations  PLAN FOR NEXT SESSION: continue traction if helpful, DN 1x/wk, emphasize neutral postural alignment and strength, postural strength.  ERO next visit.  Lorrene Reid, PT 11/23/22 1:17 PM

## 2022-11-26 ENCOUNTER — Ambulatory Visit: Payer: PPO

## 2022-11-26 DIAGNOSIS — R293 Abnormal posture: Secondary | ICD-10-CM | POA: Diagnosis not present

## 2022-11-26 DIAGNOSIS — M5412 Radiculopathy, cervical region: Secondary | ICD-10-CM

## 2022-11-26 DIAGNOSIS — M6281 Muscle weakness (generalized): Secondary | ICD-10-CM

## 2022-11-26 DIAGNOSIS — R252 Cramp and spasm: Secondary | ICD-10-CM

## 2022-11-26 NOTE — Therapy (Signed)
OUTPATIENT PHYSICAL THERAPY CERVICAL TREATMENT   Patient Name: [name] MRN: [id number] DOB:11-04-1950, 72 y.o., female Today's Date: 11/26/2022 Progress Note Reporting Period 10/15/22 to 11/17/22  See note below for Objective Data and Assessment of Progress/Goals.     END OF SESSION:  PT End of Session - 11/26/22 1216     Visit Number 13    Date for PT Re-Evaluation 12/03/22    Authorization Type [employer]    Progress Note Due on Visit 20    PT Start Time 1148    PT Stop Time 1230    PT Time Calculation (min) 42 min    Activity Tolerance Patient tolerated treatment well    Behavior During Therapy WFL for tasks assessed/performed                        Past Medical History:  Diagnosis Date   Asthma    has rescue inhaler, now on Flovent   Chronic back pain    Diabetes mellitus without complication (HCC)    on medformin, on Ozempic   GERD (gastroesophageal reflux disease)    History of COVID-19 04/05/2021   Hyperlipemia    Hypertension    Hypothyroidism    Insomnia    melanoma 11/2021   vulva   OAB (overactive bladder)    Seasonal allergies    Sleep apnea    uses a cpap   Past Surgical History:  Procedure Laterality Date   ANKLE ARTHROTOMY  2003   left-fx   APPENDECTOMY     back fusion  02/14/2019   L4 back fusion   BACK SURGERY  2010   lumb disk   BREAST LUMPECTOMY WITH RADIOACTIVE SEED LOCALIZATION Left 06/13/2019   Procedure: LEFT BREAST LUMPECTOMY WITH RADIOACTIVE SEED LOCALIZATION;  Surgeon: Harriette Bouillon, MD;  Location: [facility];  Service: General;  Laterality: Left;   CARPAL TUNNEL RELEASE     rt   CARPAL TUNNEL RELEASE Left 08/01/2013   Procedure: LEFT CARPAL TUNNEL RELEASE;  Surgeon: Nicki Reaper, MD;  Location: [facility];  Service: Orthopedics;  Laterality: Left;   CESAREAN SECTION  83,86   COLONOSCOPY     DILATATION & CURETTAGE/HYSTEROSCOPY WITH MYOSURE N/A 08/23/2018   Procedure:  DILATATION & CURETTAGE/HYSTEROSCOPY WITH MYOSURE POLYPECTOMY;  Surgeon: Patton Salles, MD;  Location: [facility];  Service: Gynecology;  Laterality: N/A;  fibroid resection   DILATION AND CURETTAGE OF UTERUS     x2   MASS EXCISION N/A 08/04/2017   Procedure: EXCISION LIPOMA UPPER BACK x2 AND NECK;  Surgeon: Harriette Bouillon, MD;  Location:  [facility];  Service: General;  Laterality: N/A;   NASAL SEPTOPLASTY W/ TURBINOPLASTY Bilateral 06/21/2015   Procedure: NASAL SEPTOPLASTY WITH BILATERAL TURBINATE REDUCTION;  Surgeon: Osborn Coho, MD;  Location: [facility] OR;  Service: ENT;  Laterality: Bilateral;   SINOSCOPY     TONSILLECTOMY     TUBAL LIGATION     VULVECTOMY N/A 12/24/2021   Procedure: WIDE EXCISION VULVECTOMY;  Surgeon: Carver Fila, MD;  Location: [facility];  Service: Gynecology;  Laterality: N/A;   Patient Active Problem List   Diagnosis Date Noted   Melanoma in situ (HCC) 07/17/2022   Hypothyroidism 01/22/2022   Insomnia 01/22/2022   Lipoma 01/22/2022   Major depression, single episode 01/22/2022   Morbid obesity (HCC) 01/22/2022   Overactive bladder 01/22/2022   Type 2 diabetes mellitus without complications (HCC)  01/22/2022   Allergic rhinitis due to pollen 01/22/2022   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HCC) 08/30/2021   BMI 37.0-37.9, adult 08/30/2021   Spondylolisthesis of lumbosacral region 02/14/2019   Mild persistent asthma without complication 06/10/2018   Mixed rhinitis 06/10/2018   Obstructive sleep apnea 12/11/2016   Palpitations 06/17/2016   Deflected nasal septum 09/13/2015   Hypertrophy of nasal turbinates 09/13/2015   Deviated nasal septum 06/21/2015    Class: Chronic   HYPERLIPIDEMIA 11/18/2007   Essential hypertension 11/18/2007   Allergic rhinitis 11/18/2007   COUGH 11/18/2007    PCP: Camie Patience, FNP   REFERRING PROVIDER: William Hamburger, PA  REFERRING DIAG:  [phone] (ICD-10-CM) - Brachial  neuritis  M54.16 (ICD-10-CM) - Lumbar radiculopathy    THERAPY DIAG:  Abnormal posture  Muscle weakness (generalized)  Radiculopathy, cervical region  Cramp and spasm  Rationale for Evaluation and Treatment: Rehabilitation  ONSET DATE: 3+ months    SUBJECTIVE:                                                                                                                                                                                                         SUBJECTIVE STATEMENT: I got the home traction unit and tried it yesterday at home.  Hardest part is getting up and down off the floor.   Pt states that she had an insidious onset of tingling in her R UE. She reports no know MOI. Pt to receive an MRI of lower back and neck on April 27th. Pt is able to reduce symptoms with head tilt to the L.  Hand dominance: Right  PERTINENT HISTORY:  Asthma, chronic back pain, DM, GERD, HTN, hypothyroidism, Sleep apnea.   PAIN:  Are you having pain? Yes: NPRS scale: 0/10 Pain location: R upper extremity into hand.  Pain description: N/T Aggravating factors: Head in neutral position, sleeping positions, in the morning  Relieving factors: Tilting head to Lt , dry needling, traction, home TENs.  PRECAUTIONS: None   WEIGHT BEARING RESTRICTIONS: No   FALLS:  Has patient fallen in last 6 months? No   LIVING ENVIRONMENT: Lives with: lives with their family Lives in: House/apartment   OCCUPATION: retired Engineer, civil (consulting)    PLOF: Independent, Independent with basic ADLs, Independent with household mobility without device, Independent with community mobility without device, Independent with gait, and Independent with transfers   PATIENT GOALS: To eliminate neck pain and avoid further problems.    NEXT MD VISIT: as needed  OBJECTIVE:   DIAGNOSTIC FINDINGS:  None recently.   PATIENT SURVEYS:  FOTO 45.49%  11/17/22: 57 (goal met so removed from FOTO)  COGNITION: Overall cognitive status:  Within functional limits for tasks assessed  SENSATION: N/T into thumb and index finger.   POSTURE: rounded shoulders, forward head, and Head tilt to L.   PALPATION: Tenderness to R cervical paraspinals and into UT. Pt reports her index finger being very itchy.    CERVICAL ROM:   Active ROM A/PROM (deg) eval A/ROM 11/17/22  Flexion 56   Extension 18 with numbness, tingling   Right lateral flexion 40 35   Left lateral flexion 33 with numbness, tingling and stiffness reported. 40  Right rotation 26 tight, numbness, tingling 40  Left rotation 45 50   (Blank rows = not tested)  UPPER EXTREMITY ROM:  Active ROM Right eval Left eval  Shoulder flexion [facility]  Shoulder abduction [facility]  Shoulder internal rotation [facility]  Shoulder external rotation [facility] WFL   (Blank rows = not tested)  UPPER EXTREMITY MMT:  MMT Right eval Left eval  Shoulder flexion 4- 4-  Shoulder abduction 4- 4-  Shoulder internal rotation 4 4  Shoulder external rotation 4 4  Grip strength 50 53   (Blank rows = not tested) Grip: Lt 60#, Rt 44# 11/26/22: Lt 58#, 49#  CERVICAL SPECIAL TESTS:  Upper limb tension test (ULTT): Positive and Spurling's test: Positive  TODAY'S TREATMENT: Date 11/26/2022: Arm bike: Level 1.4 x 6 minutes (3/3)-discussed progress with PT Green digiflex: mass grip x2  Shoulder flexion: flexion with 1# 2x10, scaption: 2# 2x10 Wall clocks: green loop 2x5 each ER with green loop 2x10  Cervical mechanical traction: 15#/5# x 12 minutes- 60 sec/10 seconds  Date 11/23/2022: Arm bike: Level 1.4 x 6 minutes (3/3)-discussed progress with PT Red digiflex: mass grip x2  Shoulder flexion: flexion with 1# 2x10, scaption: 2x10 Red band: shoulder extension and rowing: 2x10- moderate tactile cues for scapular depression  Cervical mechanical traction: 15#/5# x 12 minutes- 60 sec/10 seconds   Date 11/19/2022: Arm bike: Level 1.4 x 6 minutes (3/3)-discussed progress with PT Red  digiflex: mass grip x2  Shoulder flexion: flexion with 1# x15, scaption: x15 Red band: shoulder extension and rowing: 2x10  Cervical mechanical traction: 14#/5# x 12 minutes-  static hold using Saunders Traction Unit  PATIENT EDUCATION:  Education details: Educated pt on anatomy and physiology of current symptoms, FOTO, diagnosis, prognosis, HEP,  and POC. Person educated: Patient Education method: Medical illustrator Education comprehension: verbalized understanding and returned demonstration  HOME EXERCISE PROGRAM: Access Code: [email or web address] URL: [email or web address] Date: 11/19/2022 Prepared by: Tresa Endo  Exercises - Seated Scapular Retraction  - 2 x daily - 7 x weekly - 2 sets - 10 reps - Seated Upper Trapezius Stretch  - 2 x daily - 7 x weekly - 2 sets - 2 reps - 30 hold - Seated Levator Scapulae Stretch  - 2 x daily - 7 x weekly - 2 sets - 2 reps - 30 hold - Putty Squeezes  - 2 x daily - 7 x weekly - 2 sets - 10 reps - Median Nerve Tensioner  - 2 x daily - 7 x weekly - 2 sets - 10 reps - Shoulder extension with resistance - Neutral  - 1 x daily - 7 x weekly - 2 sets - 10 reps - Standing Row with Anchored Resistance  - 1 x daily - 7 x weekly - 2 sets - 10 reps  ASSESSMENT:  CLINICAL IMPRESSION: Pt is going to have injection into neck in  June.   Pt now has a Saunders home traction unit at home now and will do this daily to help to reduce UE radiculopathy.   Pt is doing well with strength progression and added 2# with seated flexion and scaption and did well with wall clocks. Pt requires verbal and demo cues for alignment with seated exercise. Pt is consistent and independent with HEP for strength, posture and flexibility. Pt will continue to benefit from skilled PT to address continued deficits.    OBJECTIVE IMPAIRMENTS: decreased activity tolerance, decreased shoulder mobility, decreased ROM, decreased strength, impaired flexibility, impaired UE use, postural  dysfunction, and pain.  ACTIVITY LIMITATIONS: reaching, lifting, carry,  cleaning, driving, and or occupation  PERSONAL FACTORS:  also affecting patient's functional outcome.  REHAB POTENTIAL: Good  CLINICAL DECISION MAKING: Stable/uncomplicated  EVALUATION COMPLEXITY: Low    GOALS: Short term PT Goals Target date: 10/29/2022 Pt will be I and compliant with HEP. Baseline:  Goal status: MET (10/26/22) Pt will decrease pain by 25% overall Baseline: 10% (11/02/22) Goal status: On going   Long term PT goals Target date: 12/03/2022  Pt will improve cervical shoulder AROM to [facility] to improve functional reaching Baseline: see above (11/17/22) Goal status: in progress  Pt will improve bilat shoulder strength to at least 4+/5 MMT to improve functional strength Baseline: Goal status: in progress  Pt will improve FOTO to at least 57% functional to show improved function Baseline: 57 (11/17/22) Goal status: New Pt will reduce pain to overall less than <2/10 with usual activity and work activity. Baseline: up to 6/10 (11/17/22) Goal status: in progress  Pt will be able to hold head in neutral without onset of symptoms.  Baseline: tilts to Lt when she gets symptoms (11/02/22) Goal status: on going Pt will improve posture with min cues.  Baseline: Goal status: on going  PLAN: PT FREQUENCY: 2x per week   PT DURATION: 6 weeks.   PLANNED INTERVENTIONS (unless contraindicated): aquatic PT, Canalith repositioning, cryotherapy, Electrical stimulation, Iontophoresis with 4 mg/ml dexamethasome, Moist heat, traction, Ultrasound, gait training, Therapeutic exercise, balance training, neuromuscular re-education, patient/family education, prosthetic training, manual techniques, passive ROM, dry needling, taping, vasopnuematic device, vestibular, spinal manipulations, joint manipulations  PLAN FOR NEXT SESSION: continue traction if helpful, DN 1x/wk, emphasize neutral postural alignment and strength,  postural strength.  ERO next visit.  Lorrene Reid, PT 11/26/22 12:17 PM

## 2022-12-02 ENCOUNTER — Ambulatory Visit: Payer: PPO

## 2022-12-02 DIAGNOSIS — M5412 Radiculopathy, cervical region: Secondary | ICD-10-CM

## 2022-12-02 DIAGNOSIS — R293 Abnormal posture: Secondary | ICD-10-CM | POA: Diagnosis not present

## 2022-12-02 DIAGNOSIS — M6281 Muscle weakness (generalized): Secondary | ICD-10-CM

## 2022-12-02 DIAGNOSIS — R252 Cramp and spasm: Secondary | ICD-10-CM

## 2022-12-02 NOTE — Therapy (Signed)
OUTPATIENT PHYSICAL THERAPY CERVICAL TREATMENT   Patient Name: [name] MRN: [id number] DOB:03-28-1951, 72 y.o., female Today's Date: 12/02/2022   END OF SESSION:  PT End of Session - 12/02/22 1559     Visit Number 14    Date for PT Re-Evaluation 01/15/23    Authorization Type [employer]    Progress Note Due on Visit 20    PT Start Time 1532    PT Stop Time 1612    PT Time Calculation (min) 40 min    Activity Tolerance Patient tolerated treatment well    Behavior During Therapy WFL for tasks assessed/performed                         Past Medical History:  Diagnosis Date   Asthma    has rescue inhaler, now on Flovent   Chronic back pain    Diabetes mellitus without complication (HCC)    on medformin, on Ozempic   GERD (gastroesophageal reflux disease)    History of COVID-19 04/05/2021   Hyperlipemia    Hypertension    Hypothyroidism    Insomnia    melanoma 11/2021   vulva   OAB (overactive bladder)    Seasonal allergies    Sleep apnea    uses a cpap   Past Surgical History:  Procedure Laterality Date   ANKLE ARTHROTOMY  2003   left-fx   APPENDECTOMY     back fusion  02/14/2019   L4 back fusion   BACK SURGERY  2010   lumb disk   BREAST LUMPECTOMY WITH RADIOACTIVE SEED LOCALIZATION Left 06/13/2019   Procedure: LEFT BREAST LUMPECTOMY WITH RADIOACTIVE SEED LOCALIZATION;  Surgeon: Harriette Bouillon, MD;  Location: [facility];  Service: General;  Laterality: Left;   CARPAL TUNNEL RELEASE     rt   CARPAL TUNNEL RELEASE Left 08/01/2013   Procedure: LEFT CARPAL TUNNEL RELEASE;  Surgeon: Nicki Reaper, MD;  Location: [facility];  Service: Orthopedics;  Laterality: Left;   CESAREAN SECTION  83,86   COLONOSCOPY     DILATATION & CURETTAGE/HYSTEROSCOPY WITH MYOSURE N/A 08/23/2018   Procedure: DILATATION & CURETTAGE/HYSTEROSCOPY WITH MYOSURE POLYPECTOMY;  Surgeon: Patton Salles, MD;  Location: [facility];  Service: Gynecology;  Laterality: N/A;  fibroid resection   DILATION AND CURETTAGE OF UTERUS     x2   MASS EXCISION N/A 08/04/2017   Procedure: EXCISION LIPOMA UPPER BACK x2 AND NECK;  Surgeon: Harriette Bouillon, MD;  Location: [facility];  Service: General;  Laterality: N/A;   NASAL SEPTOPLASTY W/ TURBINOPLASTY Bilateral 06/21/2015   Procedure: NASAL SEPTOPLASTY WITH BILATERAL TURBINATE REDUCTION;  Surgeon: Osborn Coho, MD;  Location: [facility] OR;  Service: ENT;  Laterality: Bilateral;   SINOSCOPY     TONSILLECTOMY     TUBAL LIGATION     VULVECTOMY N/A 12/24/2021   Procedure: WIDE EXCISION VULVECTOMY;  Surgeon: Carver Fila, MD;  Location: [facility];  Service: Gynecology;  Laterality: N/A;   Patient Active Problem List   Diagnosis Date Noted   Melanoma in situ (HCC) 07/17/2022   Hypothyroidism 01/22/2022   Insomnia 01/22/2022   Lipoma 01/22/2022   Major depression, single episode 01/22/2022   Morbid obesity (HCC) 01/22/2022   Overactive bladder 01/22/2022   Type 2 diabetes mellitus without complications (HCC) 01/22/2022   Allergic rhinitis due to pollen 01/22/2022   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HCC) 08/30/2021   BMI  37.0-37.9, adult 08/30/2021   Spondylolisthesis of lumbosacral region 02/14/2019   Mild persistent asthma without complication 06/10/2018   Mixed rhinitis 06/10/2018   Obstructive sleep apnea 12/11/2016   Palpitations 06/17/2016   Deflected nasal septum 09/13/2015   Hypertrophy of nasal turbinates 09/13/2015   Deviated nasal septum 06/21/2015    Class: Chronic   HYPERLIPIDEMIA 11/18/2007   Essential hypertension 11/18/2007   Allergic rhinitis 11/18/2007   COUGH 11/18/2007    PCP: Camie Patience, FNP   REFERRING PROVIDER: William Hamburger, PA  REFERRING DIAG:  [phone] (ICD-10-CM) - Brachial neuritis  M54.16 (ICD-10-CM) - Lumbar radiculopathy    THERAPY DIAG:  Abnormal posture - Plan: PT plan of care  cert/re-cert  Radiculopathy, cervical region - Plan: PT plan of care cert/re-cert  Cramp and spasm - Plan: PT plan of care cert/re-cert  Muscle weakness (generalized) - Plan: PT plan of care cert/re-cert  Rationale for Evaluation and Treatment: Rehabilitation  ONSET DATE: 3+ months    SUBJECTIVE:                                                                                                                                                                                                         SUBJECTIVE STATEMENT: My symptoms seem better.  I've used the traction unit a few times.  Injection scheduled for next month. 50% overall improvement in symptoms since the start of care.  Pt states that she had an insidious onset of tingling in her R UE. She reports no know MOI. Pt to receive an MRI of lower back and neck on April 27th. Pt is able to reduce symptoms with head tilt to the L.  Hand dominance: Right  PERTINENT HISTORY:  Asthma, chronic back pain, DM, GERD, HTN, hypothyroidism, Sleep apnea.   PAIN:  Are you having pain? Yes: NPRS scale: 1-2/10 Pain location: R upper extremity into hand.  Pain description: N/T Aggravating factors: Head in neutral position, sleeping positions, in the morning  Relieving factors: Tilting head to Lt , dry needling, traction, home TENs.  PRECAUTIONS: None   WEIGHT BEARING RESTRICTIONS: No   FALLS:  Has patient fallen in last 6 months? No   LIVING ENVIRONMENT: Lives with: lives with their family Lives in: House/apartment   OCCUPATION: retired Engineer, civil (consulting)    PLOF: Independent, Independent with basic ADLs, Independent with household mobility without device, Independent with community mobility without device, Independent with gait, and Independent with transfers   PATIENT GOALS: To eliminate neck pain and avoid further problems.    NEXT MD VISIT: as needed  OBJECTIVE:  DIAGNOSTIC FINDINGS:  None recently.   PATIENT SURVEYS:  FOTO  45.49% 11/17/22: 57 (goal met so removed from FOTO)  COGNITION: Overall cognitive status: Within functional limits for tasks assessed  SENSATION: N/T into thumb and index finger.   POSTURE: rounded shoulders, forward head, and Head tilt to L.   PALPATION: Tenderness to R cervical paraspinals and into UT. Pt reports her index finger being very itchy.    CERVICAL ROM:   Active ROM A/PROM (deg) eval A/ROM 11/17/22  Flexion 56   Extension 18 with numbness, tingling   Right lateral flexion 40 35   Left lateral flexion 33 with numbness, tingling and stiffness reported. 40  Right rotation 26 tight, numbness, tingling 40  Left rotation 45 50   (Blank rows = not tested)  UPPER EXTREMITY ROM:  Active ROM Right eval Left eval  Shoulder flexion [facility]  Shoulder abduction [facility]  Shoulder internal rotation [facility]  Shoulder external rotation [facility] WFL   (Blank rows = not tested)  UPPER EXTREMITY MMT:  MMT Right eval Right  12/02/22 Left eval Left 12/02/22  Shoulder flexion 4- 4 4- 4+  Shoulder abduction 4- 4- 4- 4  Shoulder internal rotation 4 4+ 4 4+  Shoulder external rotation 4 4+ 4 4+  Grip strength 50 59# 53 59#   (Blank rows = not tested) Grip: Lt 60#, Rt 44# 11/26/22: Lt 58#, 49#  CERVICAL SPECIAL TESTS:  Upper limb tension test (ULTT): Positive and Spurling's test: Positive  TODAY'S TREATMENT: Date 12/02/2022: Arm bike: Level 1.4 x 6 minutes (3/3)-discussed progress with PT Green digiflex: mass grip x2  Shoulder flexion: flexion with 2# 2x10, scaption: 2# 2x10 Wall clocks: green loop 2x5 each ER with green loop 2x10  Cervical mechanical traction: 15#/5# x 12 minutes- 60 sec/10 seconds Date 11/26/2022: Arm bike: Level 1.4 x 6 minutes (3/3)-discussed progress with PT Green digiflex: mass grip x2  Shoulder flexion: flexion with 1# 2x10, scaption: 2# 2x10 Wall clocks: green loop 2x5 each ER with green loop 2x10  Cervical mechanical traction: 15#/5# x 12  minutes- 60 sec/10 seconds   Date 11/23/2022: Arm bike: Level 1.4 x 6 minutes (3/3)-discussed progress with PT Red digiflex: mass grip x2  Shoulder flexion: flexion with 1# 2x10, scaption: 2x10 Red band: shoulder extension and rowing: 2x10- moderate tactile cues for scapular depression  Cervical mechanical traction: 15#/5# x 12 minutes- 60 sec/10 seconds   PATIENT EDUCATION:  Education details: Educated pt on anatomy and physiology of current symptoms, FOTO, diagnosis, prognosis, HEP,  and POC. Person educated: Patient Education method: Medical illustrator Education comprehension: verbalized understanding and returned demonstration  HOME EXERCISE PROGRAM: Access Code: [email or web address] URL: [email or web address] Date: 11/19/2022 Prepared by: Tresa Endo  Exercises - Seated Scapular Retraction  - 2 x daily - 7 x weekly - 2 sets - 10 reps - Seated Upper Trapezius Stretch  - 2 x daily - 7 x weekly - 2 sets - 2 reps - 30 hold - Seated Levator Scapulae Stretch  - 2 x daily - 7 x weekly - 2 sets - 2 reps - 30 hold - Putty Squeezes  - 2 x daily - 7 x weekly - 2 sets - 10 reps - Median Nerve Tensioner  - 2 x daily - 7 x weekly - 2 sets - 10 reps - Shoulder extension with resistance - Neutral  - 1 x daily - 7 x weekly - 2 sets - 10 reps - Standing Row with Anchored Resistance  -  1 x daily - 7 x weekly - 2 sets - 10 reps  ASSESSMENT:  CLINICAL IMPRESSION: Pt is going to have injection into neck in June.   Pt now has a [employer] home traction unit at home now and will do this daily to help to reduce UE radiculopathy.   Pt reports 50% overall reduction in Rt UE and neck symptoms since the start of care.  Pt reports up to 6/10 Rt UE pain related to driving and use of Rt UE.  Symptoms are reduced with Lt sidebending and cervical traction. Focus of PT is to improve UE strength, endurance and posture. Pt is doing well with strength progression.  FOTO goal was met last session and shoulder  and grip strength has improved bilaterally since the start of care.  Pt requires verbal and demo cues for alignment with seated exercise. Pt is consistent and independent with HEP for strength, posture and flexibility. Pt will continue to benefit from skilled PT to address continued deficits.    OBJECTIVE IMPAIRMENTS: decreased activity tolerance, decreased shoulder mobility, decreased ROM, decreased strength, impaired flexibility, impaired UE use, postural dysfunction, and pain.  ACTIVITY LIMITATIONS: reaching, lifting, carry,  cleaning, driving, and or occupation  PERSONAL FACTORS:  also affecting patient's functional outcome.  REHAB POTENTIAL: Good  CLINICAL DECISION MAKING: Stable/uncomplicated  EVALUATION COMPLEXITY: Low    GOALS: Short term PT Goals Target date: 10/29/2022 Pt will be I and compliant with HEP. Baseline:  Goal status: MET (10/26/22) Pt will decrease pain by 25% overall Baseline: 10% (11/02/22) Goal status: On going   Long term PT goals Target date:01/15/23  Pt will improve cervical shoulder AROM to [facility] to improve functional reaching Baseline: see above (11/17/22) Goal status: in progress   Pt will improve bilat shoulder strength to at least 4+/5 MMT to improve functional strength Baseline: see above (12/02/22) Goal status: in progress   Pt will improve FOTO to at least 57% functional to show improved function Baseline: 57 (11/17/22) Goal status:MET  Pt will reduce pain to overall less than <2/10 with usual activity and work activity. Baseline: up to 6/10 (12/02/22) Goal status: in progress   Pt will be able to hold head in neutral without onset of symptoms.  Baseline: tilts to Lt when she gets symptoms but this is 50% better (12/02/22) Goal status: on going  Pt will improve posture with min cues.  Baseline: making postural correction Goal status: on going  7.  Report 60-70% reduction in Rt UE radiculoathy with driving  Baseline: up to 1/61 and constant  with driving   Goal status: NEW  PLAN: PT FREQUENCY: 1-2x per week   PT DURATION: 6 weeks.   PLANNED INTERVENTIONS (unless contraindicated): aquatic PT, Canalith repositioning, cryotherapy, Electrical stimulation, Iontophoresis with 4 mg/ml dexamethasome, Moist heat, traction, Ultrasound, gait training, Therapeutic exercise, balance training, neuromuscular re-education, patient/family education, prosthetic training, manual techniques, passive ROM, dry needling, taping, vasopnuematic device, vestibular, spinal manipulations, joint manipulations  PLAN FOR NEXT SESSION: continue traction if helpful, DN 1x/wk, emphasize neutral postural alignment and strength, postural strength.   Lorrene Reid, PT 12/02/22 4:00 PM

## 2022-12-07 DIAGNOSIS — Z1231 Encounter for screening mammogram for malignant neoplasm of breast: Secondary | ICD-10-CM | POA: Diagnosis not present

## 2022-12-07 DIAGNOSIS — E118 Type 2 diabetes mellitus with unspecified complications: Secondary | ICD-10-CM | POA: Diagnosis not present

## 2022-12-10 ENCOUNTER — Ambulatory Visit: Payer: PPO | Attending: Physician Assistant

## 2022-12-10 DIAGNOSIS — M6281 Muscle weakness (generalized): Secondary | ICD-10-CM | POA: Insufficient documentation

## 2022-12-10 DIAGNOSIS — R252 Cramp and spasm: Secondary | ICD-10-CM | POA: Diagnosis not present

## 2022-12-10 DIAGNOSIS — M5412 Radiculopathy, cervical region: Secondary | ICD-10-CM | POA: Diagnosis not present

## 2022-12-10 DIAGNOSIS — R293 Abnormal posture: Secondary | ICD-10-CM | POA: Diagnosis not present

## 2022-12-10 NOTE — Therapy (Signed)
OUTPATIENT PHYSICAL THERAPY CERVICAL TREATMENT   Patient Name: SHAVANNA HEMMINGWAY MRN: [id number] DOB:Jul 29, 1950, 72 y.o., female Today's Date: 12/10/2022   END OF SESSION:  PT End of Session - 12/10/22 1014     Visit Number 15    Date for PT Re-Evaluation 01/15/23    Authorization Type [employer]    Progress Note Due on Visit 20    PT Start Time 0931    PT Stop Time 1012    PT Time Calculation (min) 41 min    Activity Tolerance Patient tolerated treatment well    Behavior During Therapy [facility] for tasks assessed/performed                          Past Medical History:  Diagnosis Date   Asthma    has rescue inhaler, now on Flovent   Chronic back pain    Diabetes mellitus without complication (HCC)    on medformin, on Ozempic   GERD (gastroesophageal reflux disease)    History of COVID-19 04/05/2021   Hyperlipemia    Hypertension    Hypothyroidism    Insomnia    melanoma 11/2021   vulva   OAB (overactive bladder)    Seasonal allergies    Sleep apnea    uses a cpap   Past Surgical History:  Procedure Laterality Date   ANKLE ARTHROTOMY  2003   left-fx   APPENDECTOMY     back fusion  02/14/2019   L4 back fusion   BACK SURGERY  2010   lumb disk   BREAST LUMPECTOMY WITH RADIOACTIVE SEED LOCALIZATION Left 06/13/2019   Procedure: LEFT BREAST LUMPECTOMY WITH RADIOACTIVE SEED LOCALIZATION;  Surgeon: Harriette Bouillon, MD;  Location: [facility];  Service: General;  Laterality: Left;   CARPAL TUNNEL RELEASE     rt   CARPAL TUNNEL RELEASE Left 08/01/2013   Procedure: LEFT CARPAL TUNNEL RELEASE;  Surgeon: Nicki Reaper, MD;  Location: [facility];  Service: Orthopedics;  Laterality: Left;   CESAREAN SECTION  83,86   COLONOSCOPY     DILATATION & CURETTAGE/HYSTEROSCOPY WITH MYOSURE N/A 08/23/2018   Procedure: DILATATION & CURETTAGE/HYSTEROSCOPY WITH MYOSURE POLYPECTOMY;  Surgeon: Patton Salles, MD;  Location: [facility];  Service: Gynecology;  Laterality: N/A;  fibroid resection   DILATION AND CURETTAGE OF UTERUS     x2   MASS EXCISION N/A 08/04/2017   Procedure: EXCISION LIPOMA UPPER BACK x2 AND NECK;  Surgeon: Harriette Bouillon, MD;  Location:  [facility]ERY [facility];  Service: General;  Laterality: N/A;   NASAL SEPTOPLASTY W/ TURBINOPLASTY Bilateral 06/21/2015   Procedure: NASAL SEPTOPLASTY WITH BILATERAL TURBINATE REDUCTION;  Surgeon: Osborn Coho, MD;  Location: [facility] OR;  Service: ENT;  Laterality: Bilateral;   SINOSCOPY     TONSILLECTOMY     TUBAL LIGATION     VULVECTOMY N/A 12/24/2021   Procedure: WIDE EXCISION VULVECTOMY;  Surgeon: Carver Fila, MD;  Location: [facility];  Service: Gynecology;  Laterality: N/A;   Patient Active Problem List   Diagnosis Date Noted   Melanoma in situ (HCC) 07/17/2022   Hypothyroidism 01/22/2022   Insomnia 01/22/2022   Lipoma 01/22/2022   Major depression, single episode 01/22/2022   Morbid obesity (HCC) 01/22/2022   Overactive bladder 01/22/2022   Type 2 diabetes mellitus without complications (HCC) 01/22/2022   Allergic rhinitis due to pollen 01/22/2022   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HCC) 08/30/2021  BMI 37.0-37.9, adult 08/30/2021   Spondylolisthesis of lumbosacral region 02/14/2019   Mild persistent asthma without complication 06/10/2018   Mixed rhinitis 06/10/2018   Obstructive sleep apnea 12/11/2016   Palpitations 06/17/2016   Deflected nasal septum 09/13/2015   Hypertrophy of nasal turbinates 09/13/2015   Deviated nasal septum 06/21/2015    Class: Chronic   HYPERLIPIDEMIA 11/18/2007   Essential hypertension 11/18/2007   Allergic rhinitis 11/18/2007   COUGH 11/18/2007    PCP: Camie Patience, FNP   REFERRING PROVIDER: William Hamburger, PA  REFERRING DIAG:  [phone] (ICD-10-CM) - Brachial neuritis  M54.16 (ICD-10-CM) - Lumbar radiculopathy    THERAPY DIAG:  Abnormal posture  Radiculopathy, cervical  region  Cramp and spasm  Muscle weakness (generalized)  Rationale for Evaluation and Treatment: Rehabilitation  ONSET DATE: 3+ months    SUBJECTIVE:                                                                                                                                                                                                         SUBJECTIVE STATEMENT: My arm and neck have been doing pretty good.  I am feeling sore all over today.  I haven't used my traction much over the past few days.  I get the injection next week.    Hand dominance: Right  PERTINENT HISTORY:  Asthma, chronic back pain, DM, GERD, HTN, hypothyroidism, Sleep apnea.   PAIN:  Are you having pain? Yes: NPRS scale: 2/10 Pain location: R upper extremity into hand.  Pain description: N/T Aggravating factors: Head in neutral position, sleeping positions, in the morning  Relieving factors: Tilting head to Lt , dry needling, traction, home TENs.  PRECAUTIONS: None   WEIGHT BEARING RESTRICTIONS: No   FALLS:  Has patient fallen in last 6 months? No   LIVING ENVIRONMENT: Lives with: lives with their family Lives in: House/apartment   OCCUPATION: retired Engineer, civil (consulting)    PLOF: Independent, Independent with basic ADLs, Independent with household mobility without device, Independent with community mobility without device, Independent with gait, and Independent with transfers   PATIENT GOALS: To eliminate neck pain and avoid further problems.    NEXT MD VISIT: as needed  OBJECTIVE:   DIAGNOSTIC FINDINGS:  None recently.   PATIENT SURVEYS:  FOTO 45.49% 11/17/22: 57 (goal met so removed from FOTO)  COGNITION: Overall cognitive status: Within functional limits for tasks assessed  SENSATION: N/T into thumb and index finger.   POSTURE: rounded shoulders, forward head, and Head tilt to L.   PALPATION: Tenderness to R cervical paraspinals and into  UT. Pt reports her index finger being very itchy.     CERVICAL ROM:   Active ROM A/PROM (deg) eval A/ROM 11/17/22  Flexion 56   Extension 18 with numbness, tingling   Right lateral flexion 40 35   Left lateral flexion 33 with numbness, tingling and stiffness reported. 40  Right rotation 26 tight, numbness, tingling 40  Left rotation 45 50   (Blank rows = not tested)  UPPER EXTREMITY ROM:  Active ROM Right eval Left eval  Shoulder flexion [facility]  Shoulder abduction [facility]  Shoulder internal rotation [facility]  Shoulder external rotation [facility] WFL   (Blank rows = not tested)  UPPER EXTREMITY MMT:  MMT Right eval Right  12/02/22 Left eval Left 12/02/22  Shoulder flexion 4- 4 4- 4+  Shoulder abduction 4- 4- 4- 4  Shoulder internal rotation 4 4+ 4 4+  Shoulder external rotation 4 4+ 4 4+  Grip strength 50 59# 53 59#   (Blank rows = not tested) Grip: Lt 60#, Rt 44# 11/26/22: Lt 58#, 49#  CERVICAL SPECIAL TESTS:  Upper limb tension test (ULTT): Positive and Spurling's test: Positive  TODAY'S TREATMENT:  Date 12/02/2022: Arm bike: Level 1.4 x 6 minutes (3/3)-discussed progress with PT Green digiflex: mass grip x2  Shoulder flexion: flexion with 2# 2x10, scaption: 2# 2x10 Wall clocks: green loop 2x5 each ER with green loop 2x10 Cervical mechanical traction: 15#/5# x 12 minutes- 60 sec/10 seconds  Date 12/02/2022: Arm bike: Level 1.4 x 6 minutes (3/3)-discussed progress with PT Green digiflex: mass grip x2  Shoulder flexion: flexion with 2# 2x10, scaption: 2# 2x10 Wall clocks: green loop 2x5 each ER with green loop 2x10  Cervical mechanical traction: 15#/5# x 12 minutes- 60 sec/10 seconds Date 11/26/2022: Arm bike: Level 1.4 x 6 minutes (3/3)-discussed progress with PT Green digiflex: mass grip x2  Shoulder flexion: flexion with 1# 2x10, scaption: 2# 2x10 Wall clocks: green loop 2x5 each ER with green loop 2x10  Cervical mechanical traction: 15#/5# x 12 minutes- 60 sec/10 seconds   PATIENT EDUCATION:   Education details: Educated pt on anatomy and physiology of current symptoms, FOTO, diagnosis, prognosis, HEP,  and POC. Person educated: Patient Education method: Medical illustrator Education comprehension: verbalized understanding and returned demonstration  HOME EXERCISE PROGRAM: Access Code: [email or web address] URL: [email or web address] Date: 11/19/2022 Prepared by: Tresa Endo  Exercises - Seated Scapular Retraction  - 2 x daily - 7 x weekly - 2 sets - 10 reps - Seated Upper Trapezius Stretch  - 2 x daily - 7 x weekly - 2 sets - 2 reps - 30 hold - Seated Levator Scapulae Stretch  - 2 x daily - 7 x weekly - 2 sets - 2 reps - 30 hold - Putty Squeezes  - 2 x daily - 7 x weekly - 2 sets - 10 reps - Median Nerve Tensioner  - 2 x daily - 7 x weekly - 2 sets - 10 reps - Shoulder extension with resistance - Neutral  - 1 x daily - 7 x weekly - 2 sets - 10 reps - Standing Row with Anchored Resistance  - 1 x daily - 7 x weekly - 2 sets - 10 reps  ASSESSMENT:  CLINICAL IMPRESSION: Pt arrived with widespread pain due to weather and denies significant Rt UE and neck pain.   Pt now has a [employer] home traction unit at home but has not used it the past couple of days due to being very busy.  Pt continues to report 50% overall reduction in Rt UE and neck symptoms since the start of care.   Pt is doing well with strength progression.  Pt remains weak in postural muscles and fatigues at the end of each set.  No increased tension in Rt Upper trap with palpation today. Pt requires verbal and demo cues for alignment with seated exercise. Pt is consistent and independent with HEP for strength, posture and flexibility. Pt will continue to benefit from skilled PT to address continued deficits.    OBJECTIVE IMPAIRMENTS: decreased activity tolerance, decreased shoulder mobility, decreased ROM, decreased strength, impaired flexibility, impaired UE use, postural dysfunction, and pain.  ACTIVITY  LIMITATIONS: reaching, lifting, carry,  cleaning, driving, and or occupation  PERSONAL FACTORS:  also affecting patient's functional outcome.  REHAB POTENTIAL: Good  CLINICAL DECISION MAKING: Stable/uncomplicated  EVALUATION COMPLEXITY: Low    GOALS: Short term PT Goals Target date: 10/29/2022 Pt will be I and compliant with HEP. Baseline:  Goal status: MET (10/26/22) Pt will decrease pain by 25% overall Baseline: 10% (11/02/22) Goal status: On going   Long term PT goals Target date:01/15/23  Pt will improve cervical shoulder AROM to [facility] to improve functional reaching Baseline: see above (11/17/22) Goal status: in progress   Pt will improve bilat shoulder strength to at least 4+/5 MMT to improve functional strength Baseline: see above (12/02/22) Goal status: in progress   Pt will improve FOTO to at least 57% functional to show improved function Baseline: 57 (11/17/22) Goal status:MET  Pt will reduce pain to overall less than <2/10 with usual activity and work activity. Baseline: up to 6/10 (12/02/22) Goal status: in progress   Pt will be able to hold head in neutral without onset of symptoms.  Baseline: tilts to Lt when she gets symptoms but this is 50% better (12/02/22) Goal status: on going  Pt will improve posture with min cues.  Baseline: making postural correction Goal status: on going  7.  Report 60-70% reduction in Rt UE radiculoathy with driving  Baseline: up to 1/61 and constant with driving   Goal status: NEW  PLAN: PT FREQUENCY: 1-2x per week   PT DURATION: 6 weeks.   PLANNED INTERVENTIONS (unless contraindicated): aquatic PT, Canalith repositioning, cryotherapy, Electrical stimulation, Iontophoresis with 4 mg/ml dexamethasome, Moist heat, traction, Ultrasound, gait training, Therapeutic exercise, balance training, neuromuscular re-education, patient/family education, prosthetic training, manual techniques, passive ROM, dry needling, taping, vasopnuematic  device, vestibular, spinal manipulations, joint manipulations  PLAN FOR NEXT SESSION: continue traction if helpful, DN 1x/wk, emphasize neutral postural alignment and strength, postural strength.  Pt will get an injection next week.  Lorrene Reid, PT 12/10/22 10:15 AM

## 2022-12-15 DIAGNOSIS — M5412 Radiculopathy, cervical region: Secondary | ICD-10-CM | POA: Diagnosis not present

## 2022-12-16 ENCOUNTER — Ambulatory Visit: Payer: PPO

## 2022-12-16 DIAGNOSIS — R293 Abnormal posture: Secondary | ICD-10-CM | POA: Diagnosis not present

## 2022-12-16 DIAGNOSIS — M5412 Radiculopathy, cervical region: Secondary | ICD-10-CM

## 2022-12-16 DIAGNOSIS — R252 Cramp and spasm: Secondary | ICD-10-CM

## 2022-12-16 DIAGNOSIS — M6281 Muscle weakness (generalized): Secondary | ICD-10-CM

## 2022-12-16 NOTE — Therapy (Signed)
OUTPATIENT PHYSICAL THERAPY CERVICAL TREATMENT   Patient Name: [name] MRN: [id number] DOB:1951/04/20, 72 y.o., female Today's Date: 12/16/2022   END OF SESSION:  PT End of Session - 12/16/22 1428     Visit Number 16    Date for PT Re-Evaluation 01/15/23    Authorization Type [employer]    Progress Note Due on Visit 20    PT Start Time 1400    PT Stop Time 1441    PT Time Calculation (min) 41 min    Activity Tolerance Patient tolerated treatment well    Behavior During Therapy WFL for tasks assessed/performed                           Past Medical History:  Diagnosis Date   Asthma    has rescue inhaler, now on Flovent   Chronic back pain    Diabetes mellitus without complication (HCC)    on medformin, on Ozempic   GERD (gastroesophageal reflux disease)    History of COVID-19 04/05/2021   Hyperlipemia    Hypertension    Hypothyroidism    Insomnia    melanoma 11/2021   vulva   OAB (overactive bladder)    Seasonal allergies    Sleep apnea    uses a cpap   Past Surgical History:  Procedure Laterality Date   ANKLE ARTHROTOMY  2003   left-fx   APPENDECTOMY     back fusion  02/14/2019   L4 back fusion   BACK SURGERY  2010   lumb disk   BREAST LUMPECTOMY WITH RADIOACTIVE SEED LOCALIZATION Left 06/13/2019   Procedure: LEFT BREAST LUMPECTOMY WITH RADIOACTIVE SEED LOCALIZATION;  Surgeon: Harriette Bouillon, MD;  Location: [facility];  Service: General;  Laterality: Left;   CARPAL TUNNEL RELEASE     rt   CARPAL TUNNEL RELEASE Left 08/01/2013   Procedure: LEFT CARPAL TUNNEL RELEASE;  Surgeon: Nicki Reaper, MD;  Location: [facility];  Service: Orthopedics;  Laterality: Left;   CESAREAN SECTION  83,86   COLONOSCOPY     DILATATION & CURETTAGE/HYSTEROSCOPY WITH MYOSURE N/A 08/23/2018   Procedure: DILATATION & CURETTAGE/HYSTEROSCOPY WITH MYOSURE POLYPECTOMY;  Surgeon: Patton Salles, MD;  Location: [facility];  Service: Gynecology;  Laterality: N/A;  fibroid resection   DILATION AND CURETTAGE OF UTERUS     x2   MASS EXCISION N/A 08/04/2017   Procedure: EXCISION LIPOMA UPPER BACK x2 AND NECK;  Surgeon: Harriette Bouillon, MD;  Location: [facility];  Service: General;  Laterality: N/A;   NASAL SEPTOPLASTY W/ TURBINOPLASTY Bilateral 06/21/2015   Procedure: NASAL SEPTOPLASTY WITH BILATERAL TURBINATE REDUCTION;  Surgeon: Osborn Coho, MD;  Location: [facility];  Service: ENT;  Laterality: Bilateral;   SINOSCOPY     TONSILLECTOMY     TUBAL LIGATION     VULVECTOMY N/A 12/24/2021   Procedure: WIDE EXCISION VULVECTOMY;  Surgeon: Carver Fila, MD;  Location: [facility];  Service: Gynecology;  Laterality: N/A;   Patient Active Problem List   Diagnosis Date Noted   Melanoma in situ (HCC) 07/17/2022   Hypothyroidism 01/22/2022   Insomnia 01/22/2022   Lipoma 01/22/2022   Major depression, single episode 01/22/2022   Morbid obesity (HCC) 01/22/2022   Overactive bladder 01/22/2022   Type 2 diabetes mellitus without complications (HCC) 01/22/2022   Allergic rhinitis due to pollen 01/22/2022   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HCC) 08/30/2021  BMI 37.0-37.9, adult 08/30/2021   Spondylolisthesis of lumbosacral region 02/14/2019   Mild persistent asthma without complication 06/10/2018   Mixed rhinitis 06/10/2018   Obstructive sleep apnea 12/11/2016   Palpitations 06/17/2016   Deflected nasal septum 09/13/2015   Hypertrophy of nasal turbinates 09/13/2015   Deviated nasal septum 06/21/2015    Class: Chronic   HYPERLIPIDEMIA 11/18/2007   Essential hypertension 11/18/2007   Allergic rhinitis 11/18/2007   COUGH 11/18/2007    PCP: Camie Patience, FNP   REFERRING PROVIDER: William Hamburger, PA  REFERRING DIAG:  [phone] (ICD-10-CM) - Brachial neuritis  M54.16 (ICD-10-CM) - Lumbar radiculopathy    THERAPY DIAG:  Abnormal posture  Radiculopathy, cervical  region  Cramp and spasm  Muscle weakness (generalized)  Rationale for Evaluation and Treatment: Rehabilitation  ONSET DATE: 3+ months    SUBJECTIVE:                                                                                                                                                                                                         SUBJECTIVE STATEMENT: I got the injection yesterday and I feel really good.    Hand dominance: Right  PERTINENT HISTORY:  Asthma, chronic back pain, DM, GERD, HTN, hypothyroidism, Sleep apnea.   PAIN:  Are you having pain? Yes: NPRS scale: 0/10 Pain location: R upper extremity into hand.  Pain description: N/T Aggravating factors: Head in neutral position, sleeping positions, in the morning  Relieving factors: Tilting head to Lt , dry needling, traction, home TENs.  PRECAUTIONS: None   WEIGHT BEARING RESTRICTIONS: No   FALLS:  Has patient fallen in last 6 months? No   LIVING ENVIRONMENT: Lives with: lives with their family Lives in: House/apartment   OCCUPATION: retired Engineer, civil (consulting)    PLOF: Independent, Independent with basic ADLs, Independent with household mobility without device, Independent with community mobility without device, Independent with gait, and Independent with transfers   PATIENT GOALS: To eliminate neck pain and avoid further problems.    NEXT MD VISIT: as needed  OBJECTIVE:   DIAGNOSTIC FINDINGS:  None recently.   PATIENT SURVEYS:  FOTO 45.49% 11/17/22: 57 (goal met so removed from FOTO)  COGNITION: Overall cognitive status: Within functional limits for tasks assessed  SENSATION: N/T into thumb and index finger.   POSTURE: rounded shoulders, forward head, and Head tilt to L.   PALPATION: Tenderness to R cervical paraspinals and into UT. Pt reports her index finger being very itchy.    CERVICAL ROM:   Active ROM A/PROM (deg) eval A/ROM 11/17/22  Flexion 56  Extension 18 with numbness,  tingling   Right lateral flexion 40 35   Left lateral flexion 33 with numbness, tingling and stiffness reported. 40  Right rotation 26 tight, numbness, tingling 40  Left rotation 45 50   (Blank rows = not tested)  UPPER EXTREMITY ROM:  Active ROM Right eval Left eval  Shoulder flexion [facility]  Shoulder abduction [facility]  Shoulder internal rotation [facility]  Shoulder external rotation [facility] WFL   (Blank rows = not tested)  UPPER EXTREMITY MMT:  MMT Right eval Right  12/02/22 Left eval Left 12/02/22  Shoulder flexion 4- 4 4- 4+  Shoulder abduction 4- 4- 4- 4  Shoulder internal rotation 4 4+ 4 4+  Shoulder external rotation 4 4+ 4 4+  Grip strength 50 59# 53 59#   (Blank rows = not tested) Grip: Lt 60#, Rt 44# 11/26/22: Lt 58#, 49#  CERVICAL SPECIAL TESTS:  Upper limb tension test (ULTT): Positive and Spurling's test: Positive  TODAY'S TREATMENT:  Date 12/16/2022: Arm bike: Level 1.5 x 6 minutes (3/3)-discussed progress with PT Wall push-ups 2x10 Upper trap stretch 3x20 seconds  Shoulder flexion: flexion with 2# 2x10, scaption: 2# 2x10 Wall clocks: blue loop 2x5 each ER with green loop 2x10 Cervical mechanical traction: 15#/5# x 12 minutes- 60 sec/10 seconds  Date 12/02/2022: Arm bike: Level 1.4 x 6 minutes (3/3)-discussed progress with PT Green digiflex: mass grip x2  Shoulder flexion: flexion with 2# 2x10, scaption: 2# 2x10 Wall clocks: green loop 2x5 each ER with green loop 2x10 Cervical mechanical traction: 15#/5# x 12 minutes- 60 sec/10 seconds  Date 12/02/2022: Arm bike: Level 1.4 x 6 minutes (3/3)-discussed progress with PT Green digiflex: mass grip x2  Shoulder flexion: flexion with 2# 2x10, scaption: 2# 2x10 Wall clocks: green loop 2x5 each ER with green loop 2x10  Cervical mechanical traction: 15#/5# x 12 minutes- 60 sec/10 seconds   PATIENT EDUCATION:  Education details: Educated pt on anatomy and physiology of current symptoms, FOTO, diagnosis,  prognosis, HEP,  and POC. Person educated: Patient Education method: Medical illustrator Education comprehension: verbalized understanding and returned demonstration  HOME EXERCISE PROGRAM: Access Code: [email or web address] URL: [email or web address] Date: 11/19/2022 Prepared by: Tresa Endo  Exercises - Seated Scapular Retraction  - 2 x daily - 7 x weekly - 2 sets - 10 reps - Seated Upper Trapezius Stretch  - 2 x daily - 7 x weekly - 2 sets - 2 reps - 30 hold - Seated Levator Scapulae Stretch  - 2 x daily - 7 x weekly - 2 sets - 2 reps - 30 hold - Putty Squeezes  - 2 x daily - 7 x weekly - 2 sets - 10 reps - Median Nerve Tensioner  - 2 x daily - 7 x weekly - 2 sets - 10 reps - Shoulder extension with resistance - Neutral  - 1 x daily - 7 x weekly - 2 sets - 10 reps - Standing Row with Anchored Resistance  - 1 x daily - 7 x weekly - 2 sets - 10 reps  ASSESSMENT:  CLINICAL IMPRESSION: Pt got injection into Rt neck yesterday.  She reports relief of symptoms today and denies any pain in the Rt arm.  Pt will cancel next session and continue to use home traction and perform strength exercises at home.    Pt is doing well with strength progression and reports some mild Rt UE radiculopathy with UE strength exercises.  Pt did well with advancement of band  with wall clocks and ER.  Pt is consistent and independent with HEP for strength, posture and flexibility. Pt will continue to benefit from skilled PT to address continued deficits.    OBJECTIVE IMPAIRMENTS: decreased activity tolerance, decreased shoulder mobility, decreased ROM, decreased strength, impaired flexibility, impaired UE use, postural dysfunction, and pain.  ACTIVITY LIMITATIONS: reaching, lifting, carry,  cleaning, driving, and or occupation  PERSONAL FACTORS:  also affecting patient's functional outcome.  REHAB POTENTIAL: Good  CLINICAL DECISION MAKING: Stable/uncomplicated  EVALUATION COMPLEXITY:  Low    GOALS: Short term PT Goals Target date: 10/29/2022 Pt will be I and compliant with HEP. Baseline:  Goal status: MET (10/26/22) Pt will decrease pain by 25% overall Baseline: 10% (11/02/22) Goal status: On going   Long term PT goals Target date:01/15/23  Pt will improve cervical shoulder AROM to [facility] to improve functional reaching Baseline: see above (11/17/22) Goal status: in progress   Pt will improve bilat shoulder strength to at least 4+/5 MMT to improve functional strength Baseline: see above (12/02/22) Goal status: in progress   Pt will improve FOTO to at least 57% functional to show improved function Baseline: 57 (11/17/22) Goal status:MET  Pt will reduce pain to overall less than <2/10 with usual activity and work activity. Baseline: up to 6/10 (12/02/22) Goal status: in progress   Pt will be able to hold head in neutral without onset of symptoms.  Baseline: tilts to Lt when she gets symptoms but this is 50% better (12/02/22) Goal status: on going  Pt will improve posture with min cues.  Baseline: making postural correction Goal status: on going  7.  Report 60-70% reduction in Rt UE radiculoathy with driving  Baseline: up to 4/16 and constant with driving   Goal status: NEW  PLAN: PT FREQUENCY: 1-2x per week   PT DURATION: 6 weeks.   PLANNED INTERVENTIONS (unless contraindicated): aquatic PT, Canalith repositioning, cryotherapy, Electrical stimulation, Iontophoresis with 4 mg/ml dexamethasome, Moist heat, traction, Ultrasound, gait training, Therapeutic exercise, balance training, neuromuscular re-education, patient/family education, prosthetic training, manual techniques, passive ROM, dry needling, taping, vasopnuematic device, vestibular, spinal manipulations, joint manipulations  PLAN FOR NEXT SESSION: Pt will skip a week to see how she does at home. continue traction if helpful, DN 1x/wk, emphasize neutral postural alignment and strength, postural strength.     Lorrene Reid, PT 12/16/22 2:41 PM

## 2022-12-28 ENCOUNTER — Ambulatory Visit: Payer: PPO

## 2022-12-28 DIAGNOSIS — M6281 Muscle weakness (generalized): Secondary | ICD-10-CM

## 2022-12-28 DIAGNOSIS — R252 Cramp and spasm: Secondary | ICD-10-CM

## 2022-12-28 DIAGNOSIS — R293 Abnormal posture: Secondary | ICD-10-CM | POA: Diagnosis not present

## 2022-12-28 DIAGNOSIS — M5412 Radiculopathy, cervical region: Secondary | ICD-10-CM

## 2022-12-28 NOTE — Therapy (Signed)
OUTPATIENT PHYSICAL THERAPY CERVICAL TREATMENT   Patient Name: [name] MRN: [id number] DOB:07/24/1950, 72 y.o., female Today's Date: 12/28/2022   END OF SESSION:  PT End of Session - 12/28/22 1129     Visit Number 17    Date for PT Re-Evaluation 01/15/23    Authorization Type [employer]    Progress Note Due on Visit 20    PT Start Time 1102    PT Stop Time 1145    PT Time Calculation (min) 43 min    Activity Tolerance Patient tolerated treatment well    Behavior During Therapy WFL for tasks assessed/performed                            Past Medical History:  Diagnosis Date   Asthma    has rescue inhaler, now on Flovent   Chronic back pain    Diabetes mellitus without complication (HCC)    on medformin, on Ozempic   GERD (gastroesophageal reflux disease)    History of COVID-19 04/05/2021   Hyperlipemia    Hypertension    Hypothyroidism    Insomnia    melanoma 11/2021   vulva   OAB (overactive bladder)    Seasonal allergies    Sleep apnea    uses a cpap   Past Surgical History:  Procedure Laterality Date   ANKLE ARTHROTOMY  2003   left-fx   APPENDECTOMY     back fusion  02/14/2019   L4 back fusion   BACK SURGERY  2010   lumb disk   BREAST LUMPECTOMY WITH RADIOACTIVE SEED LOCALIZATION Left 06/13/2019   Procedure: LEFT BREAST LUMPECTOMY WITH RADIOACTIVE SEED LOCALIZATION;  Surgeon: Harriette Bouillon, MD;  Location: [facility];  Service: General;  Laterality: Left;   CARPAL TUNNEL RELEASE     rt   CARPAL TUNNEL RELEASE Left 08/01/2013   Procedure: LEFT CARPAL TUNNEL RELEASE;  Surgeon: Nicki Reaper, MD;  Location:  SUR[facility]ERY [facility];  Service: Orthopedics;  Laterality: Left;   CESAREAN SECTION  83,86   COLONOSCOPY     DILATATION & CURETTAGE/HYSTEROSCOPY WITH MYOSURE N/A 08/23/2018   Procedure: DILATATION & CURETTAGE/HYSTEROSCOPY WITH MYOSURE POLYPECTOMY;  Surgeon: Patton Salles, MD;  [facility];  Service: Gynecology;  Laterality: N/A;  fibroid resection   DILATION AND CURETTAGE OF UTERUS     x2   MASS EXCISION N/A 08/04/2017   Procedure: EXCISION LIPOMA UPPER BACK x2 AND NECK;  Surgeon: Harriette Bouillon, MD;  Location:  SUR[facility]ERY [facility];  Service: General;  Laterality: N/A;   NASAL SEPTOPLASTY W/ TURBINOPLASTY Bilateral 06/21/2015   Procedure: NASAL SEPTOPLASTY WITH BILATERAL TURBINATE REDUCTION;  Surgeon: Osborn Coho, MD;  Location: [facility] OR;  Service: ENT;  Laterality: Bilateral;   SINOSCOPY     TONSILLECTOMY     TUBAL LIGATION     VULVECTOMY N/A 12/24/2021   Procedure: WIDE EXCISION VULVECTOMY;  Surgeon: Carver Fila, MD;  Location: [facility];  Service: Gynecology;  Laterality: N/A;   Patient Active Problem List   Diagnosis Date Noted   Melanoma in situ (HCC) 07/17/2022   Hypothyroidism 01/22/2022   Insomnia 01/22/2022   Lipoma 01/22/2022   Major depression, single episode 01/22/2022   Morbid obesity (HCC) 01/22/2022   Overactive bladder 01/22/2022   Type 2 diabetes mellitus without complications (HCC) 01/22/2022   Allergic rhinitis due to pollen 01/22/2022   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HCC) 08/30/2021  BMI 37.0-37.9, adult 08/30/2021   Spondylolisthesis of lumbosacral region 02/14/2019   Mild persistent asthma without complication 06/10/2018   Mixed rhinitis 06/10/2018   Obstructive sleep apnea 12/11/2016   Palpitations 06/17/2016   Deflected nasal septum 09/13/2015   Hypertrophy of nasal turbinates 09/13/2015   Deviated nasal septum 06/21/2015    Class: Chronic   HYPERLIPIDEMIA 11/18/2007   Essential hypertension 11/18/2007   Allergic rhinitis 11/18/2007   COUGH 11/18/2007    PCP: Camie Patience, FNP   REFERRING PROVIDER: William Hamburger, PA  REFERRING DIAG:  [phone] (ICD-10-CM) - Brachial neuritis  M54.16 (ICD-10-CM) - Lumbar radiculopathy    THERAPY DIAG:  Abnormal posture  Radiculopathy, cervical  region  Cramp and spasm  Muscle weakness (generalized)  Rationale for Evaluation and Treatment: Rehabilitation  ONSET DATE: 3+ months    SUBJECTIVE:                                                                                                                                                                                                         SUBJECTIVE STATEMENT: I am still feeling better after injection.  I think it made my pain 65% better. I'm using traction every other day.    Hand dominance: Right  PERTINENT HISTORY:  Asthma, chronic back pain, DM, GERD, HTN, hypothyroidism, Sleep apnea.   PAIN:  Are you having pain? Yes: NPRS scale: 0-5/10 Pain location: R upper extremity into hand.  Pain description: N/T Aggravating factors: Head in neutral position, sleeping positions, in the morning, driving  Relieving factors: Tilting head to Lt , dry needling, traction, home TENs.  PRECAUTIONS: None   WEIGHT BEARING RESTRICTIONS: No   FALLS:  Has patient fallen in last 6 months? No   LIVING ENVIRONMENT: Lives with: lives with their family Lives in: House/apartment   OCCUPATION: retired Engineer, civil (consulting)    PLOF: Independent, Independent with basic ADLs, Independent with household mobility without device, Independent with community mobility without device, Independent with gait, and Independent with transfers   PATIENT GOALS: To eliminate neck pain and avoid further problems.    NEXT MD VISIT: as needed  OBJECTIVE:   DIAGNOSTIC FINDINGS:  None recently.   PATIENT SURVEYS:  FOTO 45.49% 11/17/22: 57 (goal met so removed from FOTO)  COGNITION: Overall cognitive status: Within functional limits for tasks assessed  SENSATION: N/T into thumb and index finger.   POSTURE: rounded shoulders, forward head, and Head tilt to L.   PALPATION: Tenderness to R cervical paraspinals and into UT. Pt reports her index finger being very itchy.    CERVICAL  ROM:   Active ROM A/PROM  (deg) eval A/ROM 11/17/22  Flexion 56   Extension 18 with numbness, tingling   Right lateral flexion 40 35   Left lateral flexion 33 with numbness, tingling and stiffness reported. 40  Right rotation 26 tight, numbness, tingling 40  Left rotation 45 50   (Blank rows = not tested)  UPPER EXTREMITY ROM:  Active ROM Right eval Left eval  Shoulder flexion [facility]  Shoulder abduction [facility]  Shoulder internal rotation [facility]  Shoulder external rotation [facility] WFL   (Blank rows = not tested)  UPPER EXTREMITY MMT:  MMT Right eval Right  12/02/22 Left eval Left 12/02/22  Shoulder flexion 4- 4 4- 4+  Shoulder abduction 4- 4- 4- 4  Shoulder internal rotation 4 4+ 4 4+  Shoulder external rotation 4 4+ 4 4+  Grip strength 50 59# 53 59#   (Blank rows = not tested) Grip: Lt 60#, Rt 44# 11/26/22: Lt 58#, 49#  CERVICAL SPECIAL TESTS:  Upper limb tension test (ULTT): Positive and Spurling's test: Positive  TODAY'S TREATMENT: Date 12/28/2022: Arm bike: Level 1.5 x 6 minutes (3/3)-discussed progress with PT Wall push-ups 2x10 Upper trap stretch 3x20 seconds  Shoulder flexion: flexion with 2# 2x10, scaption: 2# 2x10 Wall clocks: blue loop 2x5 each ER with green loop 2x10 Cervical mechanical traction: 15#/5# x 12 minutes- 60 sec/10 seconds  Date 12/16/2022: Arm bike: Level 1.5 x 6 minutes (3/3)-discussed progress with PT Wall push-ups 2x10 Upper trap stretch 3x20 seconds  Shoulder flexion: flexion with 2# 2x10, scaption: 2# 2x10 Wall clocks: blue loop 2x5 each ER with green loop 2x10 Cervical mechanical traction: 15#/5# x 12 minutes- 60 sec/10 seconds  Date 12/02/2022: Arm bike: Level 1.4 x 6 minutes (3/3)-discussed progress with PT Green digiflex: mass grip x2  Shoulder flexion: flexion with 2# 2x10, scaption: 2# 2x10 Wall clocks: green loop 2x5 each ER with green loop 2x10 Cervical mechanical traction: 15#/5# x 12 minutes- 60 sec/10 seconds  Date 12/02/2022: Arm bike:  Level 1.4 x 6 minutes (3/3)-discussed progress with PT Green digiflex: mass grip x2  Shoulder flexion: flexion with 2# 2x10, scaption: 2# 2x10 Wall clocks: green loop 2x5 each ER with green loop 2x10  Cervical mechanical traction: 15#/5# x 12 minutes- 60 sec/10 seconds   PATIENT EDUCATION:  Education details: Educated pt on anatomy and physiology of current symptoms, FOTO, diagnosis, prognosis, HEP,  and POC. Person educated: Patient Education method: Medical illustrator Education comprehension: verbalized understanding and returned demonstration  HOME EXERCISE PROGRAM: Access Code: [email or web address] URL: [email or web address] Date: 11/19/2022 Prepared by: Tresa Endo  Exercises - Seated Scapular Retraction  - 2 x daily - 7 x weekly - 2 sets - 10 reps - Seated Upper Trapezius Stretch  - 2 x daily - 7 x weekly - 2 sets - 2 reps - 30 hold - Seated Levator Scapulae Stretch  - 2 x daily - 7 x weekly - 2 sets - 2 reps - 30 hold - Putty Squeezes  - 2 x daily - 7 x weekly - 2 sets - 10 reps - Median Nerve Tensioner  - 2 x daily - 7 x weekly - 2 sets - 10 reps - Shoulder extension with resistance - Neutral  - 1 x daily - 7 x weekly - 2 sets - 10 reps - Standing Row with Anchored Resistance  - 1 x daily - 7 x weekly - 2 sets - 10 reps  ASSESSMENT:  CLINICAL IMPRESSION:  Pt  reports 65% reduction in frequency/intensity of symptoms since receiving the injection in her neck.   She has been using her home traction unit every other day.  PT advised that she do this at least one time daily to keep symptoms under control.  Pt is doing well with strength progression and reports some mild Rt UE radiculopathy with UE strength exercises.   Pt is consistent and independent with HEP for strength, posture and flexibility. Pt will continue to benefit from skilled PT to address continued deficits.    OBJECTIVE IMPAIRMENTS: decreased activity tolerance, decreased shoulder mobility, decreased ROM,  decreased strength, impaired flexibility, impaired UE use, postural dysfunction, and pain.  ACTIVITY LIMITATIONS: reaching, lifting, carry,  cleaning, driving, and or occupation  PERSONAL FACTORS:  also affecting patient's functional outcome.  REHAB POTENTIAL: Good  CLINICAL DECISION MAKING: Stable/uncomplicated  EVALUATION COMPLEXITY: Low    GOALS: Short term PT Goals Target date: 10/29/2022 Pt will be I and compliant with HEP. Baseline:  Goal status: MET (10/26/22) Pt will decrease pain by 25% overall Baseline: 10% (11/02/22) Goal status: On going   Long term PT goals Target date:01/15/23  Pt will improve cervical shoulder AROM to [facility] to improve functional reaching Baseline: see above (11/17/22) Goal status: in progress   Pt will improve bilat shoulder strength to at least 4+/5 MMT to improve functional strength Baseline: see above (12/02/22) Goal status: in progress   Pt will improve FOTO to at least 57% functional to show improved function Baseline: 57 (11/17/22) Goal status:MET  Pt will reduce pain to overall less than <2/10 with usual activity and work activity. Baseline: up to 6/10 (12/02/22) Goal status: in progress   Pt will be able to hold head in neutral without onset of symptoms.  Baseline: tilts to Lt when she gets symptoms but this is 50% better (12/02/22) Goal status: on going  Pt will improve posture with min cues.  Baseline: making postural correction Goal status: on going  7.  Report 60-70% reduction in Rt UE radiculoathy with driving  Baseline: up to 6/57 and constant with driving   Goal status: NEW  PLAN: PT FREQUENCY: 1-2x per week   PT DURATION: 6 weeks.   PLANNED INTERVENTIONS (unless contraindicated): aquatic PT, Canalith repositioning, cryotherapy, Electrical stimulation, Iontophoresis with 4 mg/ml dexamethasome, Moist heat, traction, Ultrasound, gait training, Therapeutic exercise, balance training, neuromuscular re-education, patient/family  education, prosthetic training, manual techniques, passive ROM, dry needling, taping, vasopnuematic device, vestibular, spinal manipulations, joint manipulations  PLAN FOR NEXT SESSION: Probable D/C to HEP next session.   Lorrene Reid, PT 12/28/22 11:41 AM

## 2023-01-05 ENCOUNTER — Ambulatory Visit: Payer: PPO | Attending: Orthopaedic Surgery

## 2023-01-05 DIAGNOSIS — M5412 Radiculopathy, cervical region: Secondary | ICD-10-CM | POA: Insufficient documentation

## 2023-01-05 DIAGNOSIS — R293 Abnormal posture: Secondary | ICD-10-CM | POA: Diagnosis not present

## 2023-01-05 DIAGNOSIS — M4722 Other spondylosis with radiculopathy, cervical region: Secondary | ICD-10-CM | POA: Diagnosis not present

## 2023-01-05 DIAGNOSIS — R252 Cramp and spasm: Secondary | ICD-10-CM | POA: Insufficient documentation

## 2023-01-05 DIAGNOSIS — M6281 Muscle weakness (generalized): Secondary | ICD-10-CM | POA: Diagnosis not present

## 2023-01-05 NOTE — Therapy (Signed)
OUTPATIENT PHYSICAL THERAPY CERVICAL DISCHARGE NOTE PHYSICAL THERAPY DISCHARGE SUMMARY  Visits from Start of Care: 18  Current functional level related to goals / functional outcomes: See below   Remaining deficits: See below   Education / Equipment: See below   Patient agrees to discharge. Patient goals were partially met. Patient is being discharged due to maximized rehab potential.     Patient Name: [name] MRN: [id number] DOB:1950-12-13, 72 y.o., female Today's Date: 01/05/2023   END OF SESSION:  PT End of Session - 01/05/23 1241     Visit Number 18    Date for PT Re-Evaluation 01/15/23    Authorization Type [employer]    Progress Note Due on Visit 20    PT Start Time 1241    PT Stop Time 1313    PT Time Calculation (min) 32 min    Activity Tolerance Patient tolerated treatment well    Behavior During Therapy WFL for tasks assessed/performed             Past Medical History:  Diagnosis Date   Asthma    has rescue inhaler, now on Flovent   Chronic back pain    Diabetes mellitus without complication (HCC)    on medformin, on Ozempic   GERD (gastroesophageal reflux disease)    History of COVID-19 04/05/2021   Hyperlipemia    Hypertension    Hypothyroidism    Insomnia    melanoma 11/2021   vulva   OAB (overactive bladder)    Seasonal allergies    Sleep apnea    uses a cpap   Past Surgical History:  Procedure Laterality Date   ANKLE ARTHROTOMY  2003   left-fx   APPENDECTOMY     back fusion  02/14/2019   L4 back fusion   BACK SURGERY  2010   lumb disk   BREAST LUMPECTOMY WITH RADIOACTIVE SEED LOCALIZATION Left 06/13/2019   Procedure: LEFT BREAST LUMPECTOMY WITH RADIOACTIVE SEED LOCALIZATION;  Surgeon: Harriette Bouillon, MD;  Location: [facility];  Service: General;  Laterality: Left;   CARPAL TUNNEL RELEASE     rt   CARPAL TUNNEL RELEASE Left 08/01/2013   Procedure: LEFT CARPAL TUNNEL RELEASE;  Surgeon: Nicki Reaper,  MD;  Location: [facility];  Service: Orthopedics;  Laterality: Left;   CESAREAN SECTION  83,86   COLONOSCOPY     DILATATION & CURETTAGE/HYSTEROSCOPY WITH MYOSURE N/A 08/23/2018   Procedure: DILATATION & CURETTAGE/HYSTEROSCOPY WITH MYOSURE POLYPECTOMY;  Surgeon: Patton Salles, MD;  Location: [facility];  Service: Gynecology;  Laterality: N/A;  fibroid resection   DILATION AND CURETTAGE OF UTERUS     x2   MASS EXCISION N/A 08/04/2017   Procedure: EXCISION LIPOMA UPPER BACK x2 AND NECK;  Surgeon: Harriette Bouillon, MD;  Location: [facility];  Service: General;  Laterality: N/A;   NASAL SEPTOPLASTY W/ TURBINOPLASTY Bilateral 06/21/2015   Procedure: NASAL SEPTOPLASTY WITH BILATERAL TURBINATE REDUCTION;  Surgeon: Osborn Coho, MD;  Location: [facility] OR;  Service: ENT;  Laterality: Bilateral;   SINOSCOPY     TONSILLECTOMY     TUBAL LIGATION     VULVECTOMY N/A 12/24/2021   Procedure: WIDE EXCISION VULVECTOMY;  Surgeon: Carver Fila, MD;  Location: [facility];  Service: Gynecology;  Laterality: N/A;   Patient Active Problem List   Diagnosis Date Noted   Melanoma in situ (HCC) 07/17/2022   Hypothyroidism 01/22/2022   Insomnia 01/22/2022   Lipoma  01/22/2022   Major depression, single episode 01/22/2022   Morbid obesity (HCC) 01/22/2022   Overactive bladder 01/22/2022   Type 2 diabetes mellitus without complications (HCC) 01/22/2022   Allergic rhinitis due to pollen 01/22/2022   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HCC) 08/30/2021   BMI 37.0-37.9, adult 08/30/2021   Spondylolisthesis of lumbosacral region 02/14/2019   Mild persistent asthma without complication 06/10/2018   Mixed rhinitis 06/10/2018   Obstructive sleep apnea 12/11/2016   Palpitations 06/17/2016   Deflected nasal septum 09/13/2015   Hypertrophy of nasal turbinates 09/13/2015   Deviated nasal septum 06/21/2015    Class: Chronic   HYPERLIPIDEMIA 11/18/2007   Essential  hypertension 11/18/2007   Allergic rhinitis 11/18/2007   COUGH 11/18/2007    PCP: Camie Patience, FNP   REFERRING PROVIDER: William Hamburger, PA  REFERRING DIAG:  [phone] (ICD-10-CM) - Brachial neuritis  M54.16 (ICD-10-CM) - Lumbar radiculopathy    THERAPY DIAG:  Abnormal posture  Radiculopathy, cervical region  Cramp and spasm  Muscle weakness (generalized)  Rationale for Evaluation and Treatment: Rehabilitation  ONSET DATE: 3+ months    SUBJECTIVE:                                                                                                                                                                                                         SUBJECTIVE STATEMENT: Patient states this is her last day.  I still have some tingling in my right arm and hand but when I saw the doctor for follow up, she stated that without surgery, she would not likely be able to eliminate those symptoms based on her MRI findings. Patient has decided to continue PT on her own and does not want surgery at this time.    Hand dominance: Right  PERTINENT HISTORY:  Asthma, chronic back pain, DM, GERD, HTN, hypothyroidism, Sleep apnea.   PAIN:  01/05/23 Are you having pain? Yes: NPRS scale: 0-5/10 Pain location: R upper extremity into hand.  Pain description: N/T Aggravating factors: Head in neutral position, sleeping positions, in the morning, driving  Relieving factors: Tilting head to Lt , dry needling, traction, home TENs.  PRECAUTIONS: None   WEIGHT BEARING RESTRICTIONS: No   FALLS:  Has patient fallen in last 6 months? No   LIVING ENVIRONMENT: Lives with: lives with their family Lives in: House/apartment   OCCUPATION: retired Engineer, civil (consulting)    PLOF: Independent, Independent with basic ADLs, Independent with household mobility without device, Independent with community mobility without device, Independent with gait, and Independent with transfers   PATIENT GOALS: To eliminate neck pain  and avoid further problems.    NEXT MD VISIT: as needed  OBJECTIVE:   DIAGNOSTIC FINDINGS:  None recently.   PATIENT SURVEYS:  FOTO 45.49% 11/17/22: 57 (goal met so removed from FOTO)  COGNITION: Overall cognitive status: Within functional limits for tasks assessed  SENSATION: N/T into thumb and index finger.   POSTURE: rounded shoulders, forward head, and Head tilt to L.   PALPATION: Tenderness to R cervical paraspinals and into UT. Pt reports her index finger being very itchy.    CERVICAL ROM:   Active ROM A/PROM (deg) eval A/ROM 11/17/22 A/ROM 01/05/23  Flexion 56  56  Extension 18 with numbness, tingling  26  Right lateral flexion 40 35  35  Left lateral flexion 33 with numbness, tingling and stiffness reported. 40 42  Right rotation 26 tight, numbness, tingling 40 44  Left rotation 45 50 52   (Blank rows = not tested)  UPPER EXTREMITY ROM:  Active ROM Right eval Left eval  Shoulder flexion [facility]  Shoulder abduction [facility]  Shoulder internal rotation [facility]  Shoulder external rotation [facility] WFL   (Blank rows = not tested)  UPPER EXTREMITY MMT:  MMT Right eval Right  12/02/22 Right  12/02/22 Left eval Left 12/02/22 Left 12/02/22  Shoulder flexion 4- 4 4+ 4- 4+ 4+  Shoulder abduction 4- 4- 4 4- 4 4+  Shoulder internal rotation 4 4+ 4+ 4 4+ 4+  Shoulder external rotation 4 4+ 4+ 4 4+ 4+  Grip strength 50 59# 52# 53# 59# 47#   (Blank rows = not tested) Grip: Lt 60#, Rt 44# 11/26/22: Lt 58#, 49#  CERVICAL SPECIAL TESTS:  Upper limb tension test (ULTT): Positive and Spurling's test: Positive  TODAY'S TREATMENT: Date 01/05/2023: Arm bike: Level 1.5 x 6 minutes (3/3)-discussed progress with PT Added fwd bent shoulder ext, rows, and horizontal abd with 1 lb to HEP (primarily right shoulder but suggested both if she had time) Added side lying ER and supine serratus punch with 1 lb to HEP bilateral  Date 12/28/2022: Arm bike: Level 1.5 x 6 minutes  (3/3)-discussed progress with PT Wall push-ups 2x10 Upper trap stretch 3x20 seconds  Shoulder flexion: flexion with 2# 2x10, scaption: 2# 2x10 Wall clocks: blue loop 2x5 each ER with green loop 2x10 Cervical mechanical traction: 15#/5# x 12 minutes- 60 sec/10 seconds  Date 12/16/2022: Arm bike: Level 1.5 x 6 minutes (3/3)-discussed progress with PT Wall push-ups 2x10 Upper trap stretch 3x20 seconds  Shoulder flexion: flexion with 2# 2x10, scaption: 2# 2x10 Wall clocks: blue loop 2x5 each ER with green loop 2x10 Cervical mechanical traction: 15#/5# x 12 minutes- 60 sec/10 seconds  Date 12/02/2022: Arm bike: Level 1.4 x 6 minutes (3/3)-discussed progress with PT Green digiflex: mass grip x2  Shoulder flexion: flexion with 2# 2x10, scaption: 2# 2x10 Wall clocks: green loop 2x5 each ER with green loop 2x10 Cervical mechanical traction: 15#/5# x 12 minutes- 60 sec/10 seconds  Date 12/02/2022: Arm bike: Level 1.4 x 6 minutes (3/3)-discussed progress with PT Green digiflex: mass grip x2  Shoulder flexion: flexion with 2# 2x10, scaption: 2# 2x10 Wall clocks: green loop 2x5 each ER with green loop 2x10  Cervical mechanical traction: 15#/5# x 12 minutes- 60 sec/10 seconds   PATIENT EDUCATION:  Education details: Educated pt on anatomy and physiology of current symptoms, FOTO, diagnosis, prognosis, HEP,  and POC. Person educated: Patient Education method: Medical illustrator Education comprehension: verbalized understanding and returned demonstration  HOME EXERCISE PROGRAM:  Access Code: [email or web address] URL: [email or web address] Date: 11/19/2022 Prepared by: Tresa Endo  Exercises - Seated Scapular Retraction  - 2 x daily - 7 x weekly - 2 sets - 10 reps - Seated Upper Trapezius Stretch  - 2 x daily - 7 x weekly - 2 sets - 2 reps - 30 hold - Seated Levator Scapulae Stretch  - 2 x daily - 7 x weekly - 2 sets - 2 reps - 30 hold - Putty Squeezes  - 2 x daily - 7 x  weekly - 2 sets - 10 reps - Median Nerve Tensioner  - 2 x daily - 7 x weekly - 2 sets - 10 reps - Shoulder extension with resistance - Neutral  - 1 x daily - 7 x weekly - 2 sets - 10 reps - Standing Row with Anchored Resistance  - 1 x daily - 7 x weekly - 2 sets - 10 reps  ASSESSMENT:  CLINICAL IMPRESSION:  Kindel has decided to discontinue PT.  She met with MD and her only option to fully relieve the radicular symptoms is surgery.  She felt that since she had her home traction unit and was independent with her HEP, she could continue on her own.  Her objective findings were somewhat unchanged but grip strength seems to be decreased since last visit.  She met most functional and strength goals otherwise.  We will DC at this time per patient request.     OBJECTIVE IMPAIRMENTS: decreased activity tolerance, decreased shoulder mobility, decreased ROM, decreased strength, impaired flexibility, impaired UE use, postural dysfunction, and pain.  ACTIVITY LIMITATIONS: reaching, lifting, carry,  cleaning, driving, and or occupation  PERSONAL FACTORS:  also affecting patient's functional outcome.  REHAB POTENTIAL: Good  CLINICAL DECISION MAKING: Stable/uncomplicated  EVALUATION COMPLEXITY: Low    GOALS: Short term PT Goals Target date: 10/29/2022 Pt will be I and compliant with HEP. Baseline:  Goal status: MET (10/26/22) Pt will decrease pain by 25% overall Baseline: 65% (01/05/23) Goal status: MET  Long term PT goals Target date:01/15/23  Pt will improve cervical shoulder AROM to [facility] to improve functional reaching Baseline: see above (11/17/22) Goal status: not met   Pt will improve bilat shoulder strength to at least 4+/5 MMT to improve functional strength Baseline: see above (12/02/22) Goal status: MET 01/05/23   Pt will improve FOTO to at least 57% functional to show improved function Baseline: 57 (11/17/22) Goal status:MET  Pt will reduce pain to overall less than <2/10 with usual  activity and work activity. Baseline: up to 6/10 (12/02/22) Goal status: MET 01/05/23   Pt will be able to hold head in neutral without onset of symptoms.  Baseline: tilts to Lt when she gets symptoms but this is 50% better (12/02/22) Goal status: NOT MET (still must tilt head to left to relieve symptoms)  Pt will improve posture with min cues.  Baseline: making postural correction Goal status: on going  7.  Report 60-70% reduction in Rt UE radiculoathy with driving  Baseline: up to 1/91 and constant with driving   Goal status: Not met  PLAN: PT FREQUENCY: 1-2x per week   PT DURATION: 6 weeks.   PLANNED INTERVENTIONS (unless contraindicated): aquatic PT, Canalith repositioning, cryotherapy, Electrical stimulation, Iontophoresis with 4 mg/ml dexamethasome, Moist heat, traction, Ultrasound, gait training, Therapeutic exercise, balance training, neuromuscular re-education, patient/family education, prosthetic training, manual techniques, passive ROM, dry needling, taping, vasopnuematic device, vestibular, spinal manipulations, joint manipulations  PLAN FOR NEXT SESSION: DC   Velvet Moomaw B.  Alayha Babineaux, PT 01/05/23 4:59 PM [facility] [address] [place], [place] [address] Phone # [phone] Fax [phone]

## 2023-01-13 DIAGNOSIS — G4733 Obstructive sleep apnea (adult) (pediatric): Secondary | ICD-10-CM | POA: Diagnosis not present

## 2023-01-21 ENCOUNTER — Other Ambulatory Visit: Payer: Self-pay | Admitting: Allergy

## 2023-01-28 ENCOUNTER — Encounter: Payer: Self-pay | Admitting: Allergy

## 2023-01-28 ENCOUNTER — Other Ambulatory Visit: Payer: Self-pay | Admitting: Allergy

## 2023-01-28 ENCOUNTER — Ambulatory Visit: Payer: PPO | Admitting: Allergy

## 2023-01-28 ENCOUNTER — Other Ambulatory Visit: Payer: Self-pay

## 2023-01-28 VITALS — BP 114/68 | HR 71 | Temp 97.9°F | Resp 18 | Ht 65.0 in | Wt 234.7 lb

## 2023-01-28 DIAGNOSIS — J310 Chronic rhinitis: Secondary | ICD-10-CM

## 2023-01-28 DIAGNOSIS — J4530 Mild persistent asthma, uncomplicated: Secondary | ICD-10-CM

## 2023-01-28 MED ORDER — FLUTICASONE PROPIONATE HFA 44 MCG/ACT IN AERO: INHALATION_SPRAY | RESPIRATORY_TRACT | 5 refills | Status: DC

## 2023-01-28 MED ORDER — LEVOCETIRIZINE DIHYDROCHLORIDE 5 MG PO TABS: ORAL_TABLET | ORAL | 5 refills | Status: DC

## 2023-01-28 MED ORDER — AZELASTINE HCL 137 MCG/SPRAY NA SOLN: NASAL | 5 refills | Status: DC

## 2023-01-28 MED ORDER — MONTELUKAST SODIUM 10 MG PO TABS: ORAL_TABLET | ORAL | 5 refills | Status: DC

## 2023-01-28 NOTE — Patient Instructions (Addendum)
Asthma  - continue Flovent 44 g 1 puff once a day at this time.    - asthma action plan if having asthma flare or respiratory illness: take Flovent 2 puffs twice a day   - Continue Singulair 10 mg daily  - have access to albuterol inhaler 2 puffs every 4-6 hours as needed for cough/wheeze/shortness of breath/chest tightness.  May use 15-20 minutes prior to activity.   Monitor frequency of use.    Asthma control goals:  Full participation in all desired activities (may need albuterol before activity) Albuterol use two time or less a week on average (not counting use with activity) Cough interfering with sleep two time or less a month Oral steroids no more than once a year No hospitalizations    Mixed rhinitis  - Continue Singulair as above  - Continue Xyzal 1/2 tab daily to every other day.    - Continue Flonase 1-2 sprays each nostril daily as needed for congestion.  Use for 1-2 weeks at a time before stopping once symptoms improved.   - Continue Astelin 1-2 sprays 1-2 times a day as needed for nasal drainage/post-nasal drip   Follow-up in 6 months or sooner if needed

## 2023-01-28 NOTE — Progress Notes (Signed)
Follow-up Note  RE: [name] MRN: [id number] DOB: 12-Nov-1950 Date of Office Visit: 01/28/2023   History of present illness: [name] is a 72 y.o. female presenting today for follow-up of asthma and mixed rhinitis.  She was last seen in the office on 07/29/2022 by myself. She state she had a horrible bout of bronchitis around start of Feb 2024.  She thinks she got something from her grandchildren who are 20 and 60 years old.   She states by like week 3 she could hardly breathe.  She needed to use her albuterol often during this time.  She went to UC at least twice and also her PCP as well several times during this time.  She did get steroid injection and a taper pack from UC which helped a bit.   Her husband has a nebulizer and did use this towards the end of her illness.  She finally did get antibiotics that did help to resolve her symptoms.   She did go up on her flovent to 2 puffs twice a day during the above illness.  She states she is not sure why she did not think to call my office to help with this illness she had. She is now back to her regular Flovent dosing of 1 puff once a day.  She also continues on Singulair daily.  Since that illness resolved she has not had any further need for albuterol use.  With the coughing bout she was having she started to notice tingling in her fingers up her arm and states when she would lean her head to sone side the tingling would resolve.  She did undergo PT and is on gabapentin.   She is doing 1/2 tab daily and  She is using astelin spray at night.  She did use flonase during the spring.     Review of systems in the past 4 weeks: 10pt ROS negative unless noted above in HPI  Past medical/social/surgical/family history have been reviewed and are unchanged unless specifically indicated below.  No changes  Medication List: Current Outpatient Medications  Medication Sig Dispense Refill   5-Hydroxytryptophan (5-HTP PO) Take 1 tablet by  mouth daily.     acetaminophen (TYLENOL) 500 MG tablet Take 500 mg by mouth 2 (two) times daily.     albuterol (VENTOLIN HFA) 108 (90 Base) MCG/ACT inhaler Inhale 2 puffs into the lungs every 4 (four) hours as needed for wheezing or shortness of breath.     amLODipine (NORVASC) 5 MG tablet Take 5 mg by mouth daily.     Azelastine HCl 137 MCG/SPRAY SOLN USE 2 SPRAYS IN EACH NOSTRIL 2 TIMES A DAY AS DIRECTED 30 mL 0   baclofen (LIORESAL) 20 MG tablet Take 20 mg by mouth See admin instructions. Take 20 mg daily, may take up to 3 times daily as needed for spasms     CINNAMON PO Take 1,000 mg by mouth in the morning and at bedtime.     diclofenac Sodium (VOLTAREN) 1 % GEL Apply 2 g topically 4 (four) times daily as needed (pain).     famotidine-calcium carbonate-magnesium hydroxide (PEPCID COMPLETE) 10-800-165 MG chewable tablet Chew 1 tablet by mouth every evening.     fluticasone (FLOVENT HFA) 44 MCG/ACT inhaler TAKE 1 PUFF BY MOUTH EVERY DAY 10.6 each 5   gabapentin (NEURONTIN) 400 MG capsule Take 400-800 mg by mouth See admin instructions. Take 400 mg in the morning and 800 mg at night  glucose blood test strip as directed finger stick once a day for 90 days     hydrochlorothiazide (MICROZIDE) 12.5 MG capsule Take 12.5 mg by mouth daily.     levocetirizine (XYZAL) 5 MG tablet TAKE 1 TABLET BY MOUTH EVERY EVENING (Patient taking differently: Take 2.5 mg by mouth. TAKE 1 TABLET BY MOUTH EVERY EVENING) 30 tablet 5   levothyroxine (SYNTHROID) 112 MCG tablet Take 112 mcg by mouth daily before breakfast.     losartan (COZAAR) 100 MG tablet Take 100 mg by mouth daily.     Melatonin 3 MG TABS Take 3 mg by mouth at bedtime.     meloxicam (MOBIC) 7.5 MG tablet Take 7.5 mg by mouth 2 (two) times daily as needed for pain.     montelukast (SINGULAIR) 10 MG tablet TAKE 1 TABLET BY MOUTH EVERYDAY AT BEDTIME 30 tablet 5   Multiple Vitamin (MULTIVITAMIN WITH MINERALS) TABS tablet Take 1 tablet by mouth at  bedtime.     MYRBETRIQ 50 MG TB24 tablet Take 50 mg by mouth daily.     Semaglutide, 2 MG/DOSE, (OZEMPIC, 2 MG/DOSE,) 8 MG/3ML SOPN Inject 2 mg into the skin once a week. 9 mL 3   sertraline (ZOLOFT) 50 MG tablet Take 50 mg by mouth daily.     simvastatin (ZOCOR) 20 MG tablet Take 20 mg by mouth at bedtime.      zolpidem (AMBIEN) 10 MG tablet Take 5 mg by mouth at bedtime as needed for sleep.      fluticasone (FLONASE) 50 MCG/ACT nasal spray Place 1 spray into both nostrils daily. (Patient not taking: Reported on 01/28/2023)     No current facility-administered medications for this visit.     Known medication allergies: Allergies  Allergen Reactions   Oxycodone-Acetaminophen Nausea Only     Physical examination: Blood pressure 114/68, pulse 71, temperature 97.9 F (36.6 C), temperature source Temporal, resp. rate 18, height 5\' 5"  (1.651 m), weight 234 lb 11.2 oz (106.5 kg), last menstrual period 07/06/1998, SpO2 95%.  General: Alert, interactive, in no acute distress. HEENT: PERRLA, TMs pearly gray, turbinates non-edematous without discharge, post-pharynx non erythematous. Neck: Supple without lymphadenopathy. Lungs: Clear to auscultation without wheezing, rhonchi or rales. {no increased work of breathing. CV: Normal S1, S2 without murmurs. Abdomen: Nondistended, nontender. Skin: Warm and dry, without lesions or rashes. Extremities:  No clubbing, cyanosis or edema. Neuro:   Grossly intact.  Diagnositics/Labs:  Spirometry: FEV1: 2.23L 98%, FVC: 2.65L 90%, ratio consistent with nonobstructive pattern  Assessment and plan: Asthma  - continue Flovent 44 g 1 puff once a day at this time.    - asthma action plan if having asthma flare or respiratory illness: take Flovent 2 puffs twice a day   - Continue Singulair 10 mg daily  - have access to albuterol inhaler 2 puffs every 4-6 hours as needed for cough/wheeze/shortness of breath/chest tightness.  May use 15-20 minutes prior to  activity.   Monitor frequency of use.    Asthma control goals:  Full participation in all desired activities (may need albuterol before activity) Albuterol use two time or less a week on average (not counting use with activity) Cough interfering with sleep two time or less a month Oral steroids no more than once a year No hospitalizations    Mixed rhinitis  - Continue Singulair as above  - Continue Xyzal 1/2 tab daily to every other day.    - Continue Flonase 1-2 sprays each nostril daily as needed  for congestion.  Use for 1-2 weeks at a time before stopping once symptoms improved.   - Continue Astelin 1-2 sprays 1-2 times a day as needed for nasal drainage/post-nasal drip   Follow-up in 6 months or sooner if needed  I appreciate the opportunity to take part in Leniya's care. Please do not hesitate to contact me with questions.  Sincerely,   Margo Aye, MD Allergy/Immunology [facility]

## 2023-01-29 ENCOUNTER — Other Ambulatory Visit: Payer: Self-pay | Admitting: Allergy

## 2023-01-29 ENCOUNTER — Encounter: Payer: Self-pay | Admitting: Gynecologic Oncology

## 2023-01-29 ENCOUNTER — Inpatient Hospital Stay: Payer: PPO | Attending: Gynecologic Oncology | Admitting: Gynecologic Oncology

## 2023-01-29 VITALS — BP 143/63 | HR 65 | Temp 98.7°F | Resp 18 | Wt 235.8 lb

## 2023-01-29 DIAGNOSIS — N9089 Other specified noninflammatory disorders of vulva and perineum: Secondary | ICD-10-CM

## 2023-01-29 DIAGNOSIS — Z8544 Personal history of malignant neoplasm of other female genital organs: Secondary | ICD-10-CM | POA: Diagnosis not present

## 2023-01-29 DIAGNOSIS — Z9079 Acquired absence of other genital organ(s): Secondary | ICD-10-CM | POA: Diagnosis not present

## 2023-01-29 DIAGNOSIS — C519 Malignant neoplasm of vulva, unspecified: Secondary | ICD-10-CM

## 2023-01-29 NOTE — Telephone Encounter (Signed)
Please change her prescription to fluticasone 44 mcg 2 puffs twice a day and see if that solves the issue with the pharmacy.

## 2023-01-29 NOTE — Progress Notes (Signed)
Gynecologic Oncology Return Clinic Visit  01/29/23  Reason for Visit: surveillance   Treatment History: 08/12/21: Vulvar biopsy revealed vulvar melanoma   09/12/21: Mild hypermetabolism in the lower vulva likely related to recent biopsy/surgery.  No discrete hypermetabolic mass.  No adenopathy.   11/04/2021: Left vulvar wide local excision and left inguinal sentinel lymph node biopsy with Surgical Oncology. Operative Findings: 1 cm melanotic lesion on medial aspect of anterior left labia minora. 1 sentinel lymph node identified in the subcutaneous fat superficial to the external oblique.  Pathology:  Diagnosis  A: Sentinel lymph node, left inguinal, removal - Negative for melanoma in one lymph node (0/1). B: Vulva, excision - Malignant melanoma. Melanoma in situ extends broadly to 11-12-10 o'clock peripheral margin and focally to the 3 o'clock peripheral margin. Margins are negative for invasive melanoma. Type: Vulvar Clark Level: IV Breslow thickness: 1.8 mm Growth phase: Vertical, spindled melanocytes with light pigment Mitoses: per square millimeter Nuclear grade: 2/3 Tumor infiltrating lymphocytes:  Regression: Not identified Ulceration: Not identified  Microscopic satellite: Not identified Vascular invasion: Not identified Perineural invasion: Not identified Associated nevus: Not identified  Margins: Melanoma in situ extends broadly to 11-12-10 o'clock peripheral margin (B3-B5) and focally to the 3 o'clock peripheral margin (B3). Margins are negative for invasive melanoma Pathologic (pT, AJCC 8th edition) staging: pT2a C: Vulva, deep margin, excision - Connective tissue.  - Negative for melanoma   11/19/2021 Tumor Board recommendations.  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 Plan: Repeat excision of vulvar melanoma in situ  - Medical Oncology referral  for consideration of additional treatment (observation vs pembro vs locoregional radiation), scheduled 5/22 Medical oncology recommendations: CT C/A/P q 3 months for 1 year then q 6 months until year 3, then annual. Consider imiquimod for the in situ component if the margins on reexcision were to show melanoma in situ. Obtain full molecular profile with Tempus testing - testing showed no targetable mutations or obvious germline mutation.   12/24/2021: Partial simple left anterior vulvectomy given margins positive for vulvar melanoma in situ.  Prior scar identified, no definitive evidence visually of melanoma lesion.  mL in situ noted involving all margins.  No residual invasive disease noted.   02/2022: CT chest, abdomen, pelvis shows multiple bilateral pulmonary nodules including previously described 5 mm left lower lobe nodule.  There is suggestion of mild increase in size of 8 mm left upper lobe nodule.  Nonspecific, recommended close follow-up.  Interval postsurgical changes in the left inguinal region.  Too small to characterize 9 mm low-attenuation lesion in the left hepatic lobe, recommended attention on follow-up.   05/17/22: CT C/A/P:  1. Interval increase in size of prominent left external iliac and inguinal lymph nodes including 1 directly adjacent to the left inguinal surgical clips, nonspecific and possibly reactive but suspicious for nodal metastatic disease.  2. Stable nonspecific bilateral pulmonary nodules, continued attention on follow-up imaging suggested. 3. Stable 9 mm hypodensity in the left hepatic lobe, technically too small to accurately characterize but distinctly not typical in appearance for that of a melanoma metastasis favored to reflect a cyst or hemangioma. Suggest continued attention on follow-up imaging. 4. Increased prominence of the left gonadal vein and pelvic collateral vessels, which can be seen in the setting of pelvic venous insufficiency. 5. Hepatic  steatosis. 6. Cholelithiasis without findings of acute cholecystitis. 7.  Aortic Atherosclerosis (ICD10-I70.0).   07/17/22: Vulvar  and peri-clitoral biopsies after Aldara treatment A. VULVA, LEFT LATERAL, 1:00, BIOPSY: NO EVIDENCE OF MELANOMA B. VULVA, LEFT LATERAL, 2:00, BIOPSY: ATYPICAL LENTIGINOUS MELANOCYTIC PROLIFERATION, SEE COMMENT C. VULVA, LEFT LATERAL, 3:00, BIOPSY: ATYPICAL LENTIGINOUS MELANOCYTIC PROLIFERATION, SEE COMMENT D. VULVA, LEFT, SUPERIOR CLITORAL FOLD, BIOPSY: SCATTERED JUNCTIONAL MELANOCYTES, NOT DIAGNOSTIC OF MALIGNANCY, SEE COMMENT E. VULVA, LEFT LATERAL, CLITORIS, BIOPSY: NO EVIDENCE OF MELANOMA F. VULVA, LEFT MEDIAL, 3:00, BIOPSY: NO EVIDENCE OF MELANOMA G. VULVA, LEFT CENTRAL, BIOPSY: NO EVIDENCE OF MELANOMA COMMENT: Numerous levels of each biopsy were evaluated by H and E as well as MelanA stain. Negative biopsies are noted above. In biopsies at 2 and 3 o'clock, there is an increase in junctional melanocytes, but without upward growth nor nest formation. This is an atypical lentiginous melanocytic proliferation. It may represent partially-treated previous melanoma but is not diagnostic for such. The superior clitoral fold exhibits scattered junctional melanocytes, which are evenly spaced and not diagnostic of malignancy. Followup and/or additional biopsies are recommended. Drs. Maryelizabeth Kaufmann and Juel Burrow have a similar opinion.    08/14/22: CT C/A/P: 1. No convincing evidence of new or progressive metastatic disease within the chest, abdomen or pelvis. 2. Low-density filling defect in the left upper lobe lateral segmental bronchus with corresponding atelectasis and new prominent left hilar nodal tissue, nonspecific findings which are favored to reflect aspiration/mucous in the airway with reactive hilar nodal tissue. However while felt less likely a metastatic endobronchial lesion is not entirely excluded and warrants further evaluation with short-term interval follow-up chest CT  versus bronchoscopy. 3. Stable bilateral pulmonary nodules, favored benign. Continued attention on follow-up imaging suggested. 4. Cholelithiasis without findings of acute cholecystitis. 5.  Aortic Atherosclerosis (ICD10-I70.0).  10/23/22: Multiple vulvar biopsies A. CLITORIS, LAT, BIOPSY:  -  Skin with mild dermal fibrosis, otherwise no significant pathology.  -  No morphologic evidence of melanocytic proliferation (clinical  history of melanoma noted).  B. VULVA, LAT, 2:00, BIOPSY:  -  Skin with mild dermal fibrosis, otherwise no significant pathology  -  No morphologic evidence of melanocytic proliferation (clinical  history of melanoma noted).   Interval History: Doing well.  Reports occasional vulvar irritation.  Otherwise denies vulvar symptoms including pain, bleeding, or discharge.  Past Medical/Surgical History: Past Medical History:  Diagnosis Date   Asthma    has rescue inhaler, now on Flovent   Chronic back pain    Diabetes mellitus without complication (HCC)    on medformin, on Ozempic   GERD (gastroesophageal reflux disease)    History of COVID-19 04/05/2021   Hyperlipemia    Hypertension    Hypothyroidism    Insomnia    melanoma 11/2021   vulva   OAB (overactive bladder)    Seasonal allergies    Sleep apnea    uses a cpap    Past Surgical History:  Procedure Laterality Date   ANKLE ARTHROTOMY  2003   left-fx   APPENDECTOMY     back fusion  02/14/2019   L4 back fusion   BACK SURGERY  2010   lumb disk   BREAST LUMPECTOMY WITH RADIOACTIVE SEED LOCALIZATION Left 06/13/2019   Procedure: LEFT BREAST LUMPECTOMY WITH RADIOACTIVE SEED LOCALIZATION;  Surgeon: Harriette Bouillon, MD;  Location: [facility];  Service: General;  Laterality: Left;   CARPAL TUNNEL RELEASE     rt   CARPAL TUNNEL RELEASE Left 08/01/2013   Procedure: LEFT CARPAL TUNNEL RELEASE;  Surgeon: Nicki Reaper, MD;  Location: [facility];  Service: Orthopedics;  Laterality: Left;   CESAREAN SECTION  83,86   COLONOSCOPY     DILATATION & CURETTAGE/HYSTEROSCOPY WITH MYOSURE N/A 08/23/2018   Procedure: DILATATION & CURETTAGE/HYSTEROSCOPY WITH MYOSURE POLYPECTOMY;  Surgeon: Patton Salles, MD;  [facility];  Service: Gynecology;  Laterality: N/A;  fibroid resection   DILATION AND CURETTAGE OF UTERUS     x2   MASS EXCISION N/A 08/04/2017   Procedure: EXCISION LIPOMA UPPER BACK x2 AND NECK;  Surgeon: Harriette Bouillon, MD;  Location: [facility];  Service: General;  Laterality: N/A;   NASAL SEPTOPLASTY W/ TURBINOPLASTY Bilateral 06/21/2015   Procedure: NASAL SEPTOPLASTY WITH BILATERAL TURBINATE REDUCTION;  Surgeon: Osborn Coho, MD;  Location: [facility];  Service: ENT;  Laterality: Bilateral;   SINOSCOPY     TONSILLECTOMY     TUBAL LIGATION     VULVECTOMY N/A 12/24/2021   Procedure: WIDE EXCISION VULVECTOMY;  Surgeon: Carver Fila, MD;  Location: [facility];  Service: Gynecology;  Laterality: N/A;    Family History  Problem Relation Age of Onset   Allergic rhinitis Father    Allergic rhinitis Sister    Asthma Sister    Endometrial cancer Sister 16   Multiple myeloma Sister    Cancer Maternal Grandmother    Asthma Paternal Aunt    Angioedema Neg Hx    Eczema Neg Hx    Immunodeficiency Neg Hx    Urticaria Neg Hx    Ovarian cancer Neg Hx    Breast cancer Neg Hx    Colon cancer Neg Hx    Prostate cancer Neg Hx    Pancreatic cancer Neg Hx     Social History   Socioeconomic History   Marital status: Married    Spouse name: Not on file   Number of children: Not on file   Years of education: Not on file   Highest education level: Not on file  Occupational History   Occupation: retored  Tobacco Use   Smoking status: Never   Smokeless tobacco: Never  Vaping Use   Vaping status: Never Used  Substance and Sexual Activity   Alcohol use: Yes    Comment: 2 glasses of  wine/month   Drug use: No   Sexual activity: Yes    Birth control/protection: Post-menopausal  Other Topics Concern   Not on file  Social History Narrative   Not on file   Social Determinants of Health   Financial Resource Strain: Not on file  Food Insecurity: No Food Insecurity (08/05/2022)   Hunger Vital Sign    Worried About Running Out of Food in the Last Year: Never true    Ran Out of Food in the Last Year: Never true  Transportation Needs: No Transportation Needs (08/05/2022)   PRAPARE - Administrator, Civil Service (Medical): No    Lack of Transportation (Non-Medical): No  Physical Activity: Not on file  Stress: Not on file  Social Connections: Not on file    Current Medications:  Current Outpatient Medications:    5-Hydroxytryptophan (5-HTP PO), Take 1 tablet by mouth daily., Disp: , Rfl:    acetaminophen (TYLENOL) 500 MG tablet, Take 500 mg by mouth 2 (two) times daily., Disp: , Rfl:    albuterol (VENTOLIN HFA) 108 (90 Base) MCG/ACT inhaler, Inhale 2 puffs into the lungs every 4 (four) hours as needed for wheezing or shortness of breath., Disp: , Rfl:    amLODipine (NORVASC) 5 MG tablet, Take 5  mg by mouth daily., Disp: , Rfl:    baclofen (LIORESAL) 20 MG tablet, Take 20 mg by mouth See admin instructions. Take 20 mg daily, may take up to 3 times daily as needed for spasms, Disp: , Rfl:    CINNAMON PO, Take 1,000 mg by mouth in the morning and at bedtime., Disp: , Rfl:    diclofenac Sodium (VOLTAREN) 1 % GEL, Apply 2 g topically 4 (four) times daily as needed (pain)., Disp: , Rfl:    gabapentin (NEURONTIN) 400 MG capsule, Take 400-800 mg by mouth See admin instructions. Take 400 mg in the morning and 800 mg at night, Disp: , Rfl:    glucose blood test strip, as directed finger stick once a day for 90 days, Disp: , Rfl:    hydrochlorothiazide (MICROZIDE) 12.5 MG capsule, Take 12.5 mg by mouth daily., Disp: , Rfl:    levothyroxine (SYNTHROID) 112 MCG tablet,  Take 112 mcg by mouth daily before breakfast., Disp: , Rfl:    losartan (COZAAR) 100 MG tablet, Take 100 mg by mouth daily., Disp: , Rfl:    Melatonin 3 MG TABS, Take 3 mg by mouth at bedtime., Disp: , Rfl:    meloxicam (MOBIC) 7.5 MG tablet, Take 7.5 mg by mouth 2 (two) times daily as needed for pain., Disp: , Rfl:    Multiple Vitamin (MULTIVITAMIN WITH MINERALS) TABS tablet, Take 1 tablet by mouth at bedtime., Disp: , Rfl:    MYRBETRIQ 50 MG TB24 tablet, Take 50 mg by mouth daily., Disp: , Rfl:    Semaglutide, 2 MG/DOSE, (OZEMPIC, 2 MG/DOSE,) 8 MG/3ML SOPN, Inject 2 mg into the skin once a week., Disp: 9 mL, Rfl: 3   sertraline (ZOLOFT) 50 MG tablet, Take 50 mg by mouth daily., Disp: , Rfl:    simvastatin (ZOCOR) 20 MG tablet, Take 20 mg by mouth at bedtime. , Disp: , Rfl:    zolpidem (AMBIEN) 10 MG tablet, Take 5 mg by mouth at bedtime as needed for sleep. , Disp: , Rfl:    Azelastine HCl 137 MCG/SPRAY SOLN, USE 2 SPRAYS IN EACH NOSTRIL 2 TIMES A DAY AS DIRECTED, Disp: 30 mL, Rfl: 5   famotidine-calcium carbonate-magnesium hydroxide (PEPCID COMPLETE) 10-800-165 MG chewable tablet, Chew 1 tablet by mouth every evening., Disp: , Rfl:    fluticasone (FLONASE) 50 MCG/ACT nasal spray, Place 1 spray into both nostrils daily. (Patient not taking: Reported on 01/28/2023), Disp: , Rfl:    fluticasone (FLOVENT HFA) 44 MCG/ACT inhaler, TAKE 1 PUFF BY MOUTH EVERY DAY, Disp: 10.6 each, Rfl: 5   levocetirizine (XYZAL) 5 MG tablet, TAKE 1 TABLET BY MOUTH EVERY EVENING, Disp: 30 tablet, Rfl: 5   montelukast (SINGULAIR) 10 MG tablet, TAKE 1 TABLET BY MOUTH EVERYDAY AT BEDTIME, Disp: 30 tablet, Rfl: 5  Review of Systems: Denies appetite changes, fevers, chills, fatigue, unexplained weight changes. Denies hearing loss, neck lumps or masses, mouth sores, ringing in ears or voice changes. Denies cough or wheezing.  Denies shortness of breath. Denies chest pain or palpitations. Denies leg swelling. Denies  abdominal distention, pain, blood in stools, constipation, diarrhea, nausea, vomiting, or early satiety. Denies pain with intercourse, dysuria, frequency, hematuria or incontinence. Denies hot flashes, pelvic pain, vaginal bleeding or vaginal discharge.   Denies joint pain, back pain or muscle pain/cramps. Denies itching, rash, or wounds. Denies dizziness, headaches, numbness or seizures. Denies swollen lymph nodes or glands, denies easy bruising or bleeding. Denies anxiety, depression, confusion, or decreased concentration.  Physical  Exam: BP (!) 143/63   Pulse 65   Temp 98.7 F (37.1 C)   Resp 18   Wt 235 lb 12.8 oz (107 kg)   LMP 07/06/1998 (Approximate)   SpO2 97%   BMI 39.24 kg/m  General: Alert, oriented, no acute distress. HEENT: Normocephalic, atraumatic, sclera anicteric. Chest: Unlabored breathing on room air. Lymphatics: No palpable cervical, supraclavicular, or inguinal adenopathy. GU: Normal-appearing external female genitalia with some skin changes related to prior excisions and biopsies.  No atypical vascularity or hyperpigmented lesions.  There is a mildly erythematous area lateral to prior biopsy sites that measures 2-3 mm just above what looks like a hair follicle.  This lesion was new and in the area where patient has been having irritation.  Vulvar biopsy Preoperative diagnosis: history of MIS, melanoma, new vulvar lesion Postoperative diagnosis: Same as above Physician: Pricilla Holm MD Estimated blood loss: Minimal Specimens: Lower biopsies Procedure: After the procedure was discussed with the patient including risks and benefits, she gave verbal consent.  The patient was already in dorsolithotomy position and the area described above was identified.  Vulva was cleansed with Betadine x 3.  1 cc of 2% lidocaine was injected for local anesthesia at the biopsy sites.  3 mm punch biopsies were taken and placed in formalin.  Silver nitrate was used to achieve hemostasis.   Overall the patient tolerated the procedure well.    Laboratory & Radiologic Studies: CT C/A/P on 11/18/22: 1. No evidence of new or progressive disease within the chest, abdomen or pelvis. 2. Stable bilateral pulmonary nodules. 3.  Aortic Atherosclerosis (ICD10-I70.0).  Assessment & Plan: ALICEA LABEAN is a 72 y.o. woman with stage IB vulvar melanoma who underwent repeat excision in an attempt to achieve negative margins given MIS present. Unfortunately, margins still positive for melanoma in situ (11/2021). Patient was treated with Aldara with posttreatment biopsies showing no definite melanoma in situ or melanoma.    She is overall doing well without evidence of disease on exam today.  Lesion seen on the vulva is not consistent with prior melanoma lesion but given new lesion and some symptoms (pruritus), biopsy recommended.  This was done without issue today.  I will call her with biopsy results next week.  We will plan on 1 additional CT scan 3 months after her last and if that is negative for any evidence of metastatic disease, we will transition to CT scans every 6 months.  I will see her back for surveillance follow-up in 3 months.  I reviewed signs and symptoms that should prompt a phone call before her next scheduled visit.  20 minutes of total time was spent for this patient encounter, including preparation, face-to-face counseling with the patient and coordination of care, and documentation of the encounter.  Eugene Garnet, MD  Division of Gynecologic Oncology  Department of Obstetrics and Gynecology  [facility]

## 2023-01-29 NOTE — Patient Instructions (Signed)
It was good to see you today.  I will call you with your biopsy results next week.  We will plan to get the CT scan in August.  I will see you back for follow-up in 3 months.

## 2023-02-03 ENCOUNTER — Other Ambulatory Visit (HOSPITAL_COMMUNITY): Payer: Self-pay

## 2023-02-03 MED ORDER — ASMANEX HFA 50 MCG/ACT IN AERO: INHALATION_SPRAY | RESPIRATORY_TRACT | 5 refills | Status: AC

## 2023-02-03 NOTE — Telephone Encounter (Signed)
Medication has been changed and sent into pharmacy on file.

## 2023-02-03 NOTE — Addendum Note (Signed)
Addended by: Rolland Bimler D on: 02/03/2023 04:07 PM   Modules accepted: Orders

## 2023-02-04 ENCOUNTER — Other Ambulatory Visit (HOSPITAL_BASED_OUTPATIENT_CLINIC_OR_DEPARTMENT_OTHER): Payer: Self-pay

## 2023-02-16 ENCOUNTER — Encounter (HOSPITAL_COMMUNITY): Payer: Self-pay

## 2023-02-16 ENCOUNTER — Ambulatory Visit (HOSPITAL_COMMUNITY)
Admission: RE | Admit: 2023-02-16 | Discharge: 2023-02-16 | Disposition: A | Discharge status: Home / Self Care | Payer: PPO | Source: Ambulatory Visit | Attending: Gynecologic Oncology | Admitting: Gynecologic Oncology

## 2023-02-16 DIAGNOSIS — E119 Type 2 diabetes mellitus without complications: Secondary | ICD-10-CM | POA: Insufficient documentation

## 2023-02-16 DIAGNOSIS — C519 Malignant neoplasm of vulva, unspecified: Secondary | ICD-10-CM | POA: Insufficient documentation

## 2023-02-16 DIAGNOSIS — D259 Leiomyoma of uterus, unspecified: Secondary | ICD-10-CM | POA: Diagnosis not present

## 2023-02-16 DIAGNOSIS — K8020 Calculus of gallbladder without cholecystitis without obstruction: Secondary | ICD-10-CM | POA: Diagnosis not present

## 2023-02-16 DIAGNOSIS — K769 Liver disease, unspecified: Secondary | ICD-10-CM | POA: Diagnosis not present

## 2023-02-16 DIAGNOSIS — Z794 Long term (current) use of insulin: Secondary | ICD-10-CM | POA: Insufficient documentation

## 2023-02-16 DIAGNOSIS — R590 Localized enlarged lymph nodes: Secondary | ICD-10-CM | POA: Diagnosis not present

## 2023-02-16 LAB — POCT I-STAT CREATININE: Creatinine, Ser: 0.8 mg/dL (ref 0.44–1.00)

## 2023-02-16 MED ORDER — SODIUM CHLORIDE (PF) 0.9 % IJ SOLN
INTRAMUSCULAR | Status: AC
  Filled 2023-02-16: qty 50

## 2023-02-16 MED ORDER — IOHEXOL 300 MG/ML  SOLN
100.0000 mL | Freq: Once | INTRAMUSCULAR | Status: AC | PRN
  Administered 2023-02-16: 100 mL via INTRAVENOUS

## 2023-02-16 MED ORDER — IOHEXOL 9 MG/ML PO SOLN
500.0000 mL | ORAL | Status: AC
  Administered 2023-02-16: 1000 mL via ORAL

## 2023-02-16 MED ORDER — IOHEXOL 9 MG/ML PO SOLN
ORAL | Status: AC
  Filled 2023-02-16: qty 1000

## 2023-02-23 DIAGNOSIS — M1711 Unilateral primary osteoarthritis, right knee: Secondary | ICD-10-CM | POA: Diagnosis not present

## 2023-02-25 DIAGNOSIS — N3946 Mixed incontinence: Secondary | ICD-10-CM | POA: Diagnosis not present

## 2023-02-25 DIAGNOSIS — R350 Frequency of micturition: Secondary | ICD-10-CM | POA: Diagnosis not present

## 2023-03-01 ENCOUNTER — Ambulatory Visit: Payer: PPO | Admitting: Podiatry

## 2023-03-01 ENCOUNTER — Encounter: Payer: Self-pay | Admitting: Podiatry

## 2023-03-01 DIAGNOSIS — S90222A Contusion of left lesser toe(s) with damage to nail, initial encounter: Secondary | ICD-10-CM

## 2023-03-01 NOTE — Progress Notes (Signed)
  Subjective:  Patient ID: [name], female    DOB: March 27, 1951,  MRN: [id number]  Chief Complaint  Patient presents with   Nail Problem    "On my left foot, I have two bruised looking toenails." N - bruised looking nail L - hallux and 3rd digit left D - 2 weeks O - suddenly C - bruised looking, thick A - none T - none     72 y.o. female presents with the above complaint. History confirmed with patient.  The big toe just showed up last week the other toe has been there for about a month  Objective:  Physical Exam: warm, good capillary refill, no trophic changes or ulcerative lesions, normal DP and PT pulses, normal sensory exam, and discoloration of left first and third toes appears to be consistent with petechial hemorrhage of the nailbed or subungual hematoma there is no onycholysis or active bleeding, she has dystrophy of the second toenail.      Assessment:   1. Subungual hematoma of toe, left, initial encounter      Plan:  Patient was evaluated and treated and all questions answered.  I reviewed my clinical exam findings with the patient discussed with her that this appears to be consistent with a petechial hemorrhage or subungual hematoma from pressure from shoe gear and activity on the nailbed.  There is no active bleeding or organized hematoma that required evacuation or avulsion of the nail plate today.  I discussed with her this likely is something that should resolve uneventfully but considering she does have a personal history of melanoma I would recommend we monitor this.  I took photographs and we will reevaluate again in 6 weeks.  If there is no distal migration or we see any evidence of proximal spread then a biopsy of the nail unit and nail bed would be recommended.  Return in about 6 weeks (around 04/12/2023) for re-evaluate toenail bruising vs biopsy.

## 2023-03-05 ENCOUNTER — Other Ambulatory Visit: Payer: Self-pay | Admitting: Medical Genetics

## 2023-03-05 DIAGNOSIS — Z006 Encounter for examination for normal comparison and control in clinical research program: Secondary | ICD-10-CM

## 2023-03-31 DIAGNOSIS — R350 Frequency of micturition: Secondary | ICD-10-CM | POA: Diagnosis not present

## 2023-03-31 DIAGNOSIS — N3946 Mixed incontinence: Secondary | ICD-10-CM | POA: Diagnosis not present

## 2023-04-08 ENCOUNTER — Encounter: Payer: Self-pay | Admitting: Gynecologic Oncology

## 2023-04-12 ENCOUNTER — Ambulatory Visit: Payer: PPO | Admitting: Podiatry

## 2023-04-12 DIAGNOSIS — E669 Obesity, unspecified: Secondary | ICD-10-CM | POA: Diagnosis not present

## 2023-04-12 DIAGNOSIS — F329 Major depressive disorder, single episode, unspecified: Secondary | ICD-10-CM | POA: Diagnosis not present

## 2023-04-12 DIAGNOSIS — G4733 Obstructive sleep apnea (adult) (pediatric): Secondary | ICD-10-CM | POA: Diagnosis not present

## 2023-04-12 DIAGNOSIS — I10 Essential (primary) hypertension: Secondary | ICD-10-CM | POA: Diagnosis not present

## 2023-04-12 DIAGNOSIS — E782 Mixed hyperlipidemia: Secondary | ICD-10-CM | POA: Diagnosis not present

## 2023-04-13 DIAGNOSIS — C510 Malignant neoplasm of labium majus: Secondary | ICD-10-CM | POA: Diagnosis not present

## 2023-04-13 DIAGNOSIS — D239 Other benign neoplasm of skin, unspecified: Secondary | ICD-10-CM | POA: Diagnosis not present

## 2023-04-13 DIAGNOSIS — D225 Melanocytic nevi of trunk: Secondary | ICD-10-CM | POA: Diagnosis not present

## 2023-04-13 DIAGNOSIS — L814 Other melanin hyperpigmentation: Secondary | ICD-10-CM | POA: Diagnosis not present

## 2023-04-13 DIAGNOSIS — L821 Other seborrheic keratosis: Secondary | ICD-10-CM | POA: Diagnosis not present

## 2023-04-13 DIAGNOSIS — L570 Actinic keratosis: Secondary | ICD-10-CM | POA: Diagnosis not present

## 2023-04-13 DIAGNOSIS — L578 Other skin changes due to chronic exposure to nonionizing radiation: Secondary | ICD-10-CM | POA: Diagnosis not present

## 2023-04-14 DIAGNOSIS — G4733 Obstructive sleep apnea (adult) (pediatric): Secondary | ICD-10-CM | POA: Diagnosis not present

## 2023-04-15 ENCOUNTER — Inpatient Hospital Stay: Payer: PPO | Attending: Gynecologic Oncology | Admitting: Gynecologic Oncology

## 2023-04-15 ENCOUNTER — Ambulatory Visit: Payer: PPO | Admitting: Podiatry

## 2023-04-15 ENCOUNTER — Encounter: Payer: Self-pay | Admitting: Gynecologic Oncology

## 2023-04-15 VITALS — BP 152/72 | HR 71 | Temp 98.2°F | Resp 19 | Ht 66.0 in | Wt 241.2 lb

## 2023-04-15 DIAGNOSIS — Z8544 Personal history of malignant neoplasm of other female genital organs: Secondary | ICD-10-CM | POA: Diagnosis not present

## 2023-04-15 DIAGNOSIS — S90212D Contusion of left great toe with damage to nail, subsequent encounter: Secondary | ICD-10-CM

## 2023-04-15 DIAGNOSIS — Z9079 Acquired absence of other genital organ(s): Secondary | ICD-10-CM | POA: Diagnosis not present

## 2023-04-15 DIAGNOSIS — C519 Malignant neoplasm of vulva, unspecified: Secondary | ICD-10-CM

## 2023-04-15 NOTE — Progress Notes (Signed)
Gynecologic Oncology Return Clinic Visit  04/15/23  Reason for Visit: surveillance   Treatment History: 08/12/21: Vulvar biopsy revealed vulvar melanoma   09/12/21: Mild hypermetabolism in the lower vulva likely related to recent biopsy/surgery.  No discrete hypermetabolic mass.  No adenopathy.   11/04/2021: Left vulvar wide local excision and left inguinal sentinel lymph node biopsy with Surgical Oncology. Operative Findings: 1 cm melanotic lesion on medial aspect of anterior left labia minora. 1 sentinel lymph node identified in the subcutaneous fat superficial to the external oblique.  Pathology:  Diagnosis  A: Sentinel lymph node, left inguinal, removal - Negative for melanoma in one lymph node (0/1). B: Vulva, excision - Malignant melanoma. Melanoma in situ extends broadly to 11-12-10 o'clock peripheral margin and focally to the 3 o'clock peripheral margin. Margins are negative for invasive melanoma. Type: Vulvar Clark Level: IV Breslow thickness: 1.8 mm Growth phase: Vertical, spindled melanocytes with light pigment Mitoses: per square millimeter Nuclear grade: 2/3 Tumor infiltrating lymphocytes:  Regression: Not identified Ulceration: Not identified  Microscopic satellite: Not identified Vascular invasion: Not identified Perineural invasion: Not identified Associated nevus: Not identified  Margins: Melanoma in situ extends broadly to 11-12-10 o'clock peripheral margin (B3-B5) and focally to the 3 o'clock peripheral margin (B3). Margins are negative for invasive melanoma Pathologic (pT, AJCC 8th edition) staging: pT2a C: Vulva, deep margin, excision - Connective tissue.  - Negative for melanoma   11/19/2021 Tumor Board recommendations.  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 Plan: Repeat excision of vulvar melanoma in situ  - Medical Oncology referral  for consideration of additional treatment (observation vs pembro vs locoregional radiation), scheduled 5/22 Medical oncology recommendations: CT C/A/P q 3 months for 1 year then q 6 months until year 3, then annual. Consider imiquimod for the in situ component if the margins on reexcision were to show melanoma in situ. Obtain full molecular profile with Temp[employer] testing - testing showed no targetable mutations or obvious germline mutation.   12/24/2021: Partial simple left anterior vulvectomy given margins positive for vulvar melanoma in situ.  Prior scar identified, no definitive evidence visually of melanoma lesion.  mL in situ noted involving all margins.  No residual invasive disease noted.   02/2022: CT chest, abdomen, pelvis shows multiple bilateral pulmonary nodules including previously described 5 mm left lower lobe nodule.  There is suggestion of mild increase in size of 8 mm left upper lobe nodule.  Nonspecific, recommended close follow-up.  Interval postsurgical changes in the left inguinal region.  Too small to characterize 9 mm low-attenuation lesion in the left hepatic lobe, recommended attention on follow-up.   05/17/22: CT C/A/P:  1. Interval increase in size of prominent left external iliac and inguinal lymph nodes including 1 directly adjacent to the left inguinal surgical clips, nonspecific and possibly reactive but suspicious for nodal metastatic disease.  2. Stable nonspecific bilateral pulmonary nodules, continued attention on follow-up imaging suggested. 3. Stable 9 mm hypodensity in the left hepatic lobe, technically too small to accurately characterize but distinctly not typical in appearance for that of a melanoma metastasis favored to reflect a cyst or hemangioma. Suggest continued attention on follow-up imaging. 4. Increased prominence of the left gonadal vein and pelvic collateral vessels, which can be seen in the setting of pelvic venous insufficiency. 5. Hepatic  steatosis. 6. Cholelithiasis without findings of acute cholecystitis. 7.  Aortic Atherosclerosis (ICD10-I70.0).   07/17/22: Vulvar  and peri-clitoral biopsies after Aldara treatment A. VULVA, LEFT LATERAL, 1:00, BIOPSY: NO EVIDENCE OF MELANOMA B. VULVA, LEFT LATERAL, 2:00, BIOPSY: ATYPICAL LENTIGINOUS MELANOCYTIC PROLIFERATION, SEE COMMENT C. VULVA, LEFT LATERAL, 3:00, BIOPSY: ATYPICAL LENTIGINOUS MELANOCYTIC PROLIFERATION, SEE COMMENT D. VULVA, LEFT, SUPERIOR CLITORAL FOLD, BIOPSY: SCATTERED JUNCTIONAL MELANOCYTES, NOT DIAGNOSTIC OF MALIGNANCY, SEE COMMENT E. VULVA, LEFT LATERAL, CLITORIS, BIOPSY: NO EVIDENCE OF MELANOMA F. VULVA, LEFT MEDIAL, 3:00, BIOPSY: NO EVIDENCE OF MELANOMA G. VULVA, LEFT CENTRAL, BIOPSY: NO EVIDENCE OF MELANOMA COMMENT: Numerous levels of each biopsy were evaluated by H and E as well as MelanA stain. Negative biopsies are noted above. In biopsies at 2 and 3 o'clock, there is an increase in junctional melanocytes, but without upward growth nor nest formation. This is an atypical lentiginous melanocytic proliferation. It may represent partially-treated previous melanoma but is not diagnostic for such. The superior clitoral fold exhibits scattered junctional melanocytes, which are evenly spaced and not diagnostic of malignancy. Followup and/or additional biopsies are recommended. Drs. Maryelizabeth Kaufmann and Juel Burrow have a similar opinion.    08/14/22: CT C/A/P: 1. No convincing evidence of new or progressive metastatic disease within the chest, abdomen or pelvis. 2. Low-density filling defect in the left upper lobe lateral segmental bronchus with corresponding atelectasis and new prominent left hilar nodal tissue, nonspecific findings which are favored to reflect aspiration/mucous in the airway with reactive hilar nodal tissue. However while felt less likely a metastatic endobronchial lesion is not entirely excluded and warrants further evaluation with short-term interval follow-up chest CT  versus bronchoscopy. 3. Stable bilateral pulmonary nodules, favored benign. Continued attention on follow-up imaging suggested. 4. Cholelithiasis without findings of acute cholecystitis. 5.  Aortic Atherosclerosis (ICD10-I70.0).   10/23/22: Multiple vulvar biopsies A. CLITORIS, LAT, BIOPSY:  -  Skin with mild dermal fibrosis, otherwise no significant pathology.  -  No morphologic evidence of melanocytic proliferation (clinical  history of melanoma noted).  B. VULVA, LAT, 2:00, BIOPSY:  -  Skin with mild dermal fibrosis, otherwise no significant pathology  -  No morphologic evidence of melanocytic proliferation (clinical  history of melanoma noted).   01/29/2023: Vulvar biopsy at 1:00 shows reactive squamous mucosa with spongiosis, hyperparakeratosis and mild chronic inflammation.  No dysplasia or melanocytic atypia.  Interval History: Doing well.  Just returned from a cruise to [place].  Denies any vulvar symptoms including pruritus, pain, or burning.  Denies any bleeding or discharge.  Past Medical/Surgical History: Past Medical History:  Diagnosis Date   Asthma    has rescue inhaler, now on Flovent   Chronic back pain    Diabetes mellitus without complication (HCC)    on medformin, on Ozempic   GERD (gastroesophageal reflux disease)    History of COVID-19 04/05/2021   Hyperlipemia    Hypertension    Hypothyroidism    Insomnia    melanoma 11/2021   vulva   OAB (overactive bladder)    Seasonal allergies    Sleep apnea    uses a cpap    Past Surgical History:  Procedure Laterality Date   ANKLE ARTHROTOMY  2003   left-fx   APPENDECTOMY     back fusion  02/14/2019   L4 back fusion   BACK SURGERY  2010   lumb disk   BREAST LUMPECTOMY WITH RADIOACTIVE SEED LOCALIZATION Left 06/13/2019   Procedure: LEFT BREAST LUMPECTOMY WITH RADIOACTIVE SEED LOCALIZATION;  Surgeon: Harriette Bouillon, MD;  Location: [facility];  Service: General;  Laterality: Left;   CARPAL  TUNNEL RELEASE  rt   CARPAL TUNNEL RELEASE Left 08/01/2013   Procedure: LEFT CARPAL TUNNEL RELEASE;  Surgeon: Nicki Reaper, MD;  Location: [facility];  Service: Orthopedics;  Laterality: Left;   CESAREAN SECTION  83,86   COLONOSCOPY     DILATATION & CURETTAGE/HYSTEROSCOPY WITH MYOSURE N/A 08/23/2018   Procedure: DILATATION & CURETTAGE/HYSTEROSCOPY WITH MYOSURE POLYPECTOMY;  Surgeon: Patton Salles, MD;  Location: [facility];  Service: Gynecology;  Laterality: N/A;  fibroid resection   DILATION AND CURETTAGE OF UTERUS     x2   MASS EXCISION N/A 08/04/2017   Procedure: EXCISION LIPOMA UPPER BACK x2 AND NECK;  Surgeon: Harriette Bouillon, MD;  Location: [facility];  Service: General;  Laterality: N/A;   NASAL SEPTOPLASTY W/ TURBINOPLASTY Bilateral 06/21/2015   Procedure: NASAL SEPTOPLASTY WITH BILATERAL TURBINATE REDUCTION;  Surgeon: Osborn Coho, MD;  Location: [facility];  Service: ENT;  Laterality: Bilateral;   SINOSCOPY     TONSILLECTOMY     TUBAL LIGATION     VULVECTOMY N/A 12/24/2021   Procedure: WIDE EXCISION VULVECTOMY;  Surgeon: Carver Fila, MD;  Location: [facility];  Service: Gynecology;  Laterality: N/A;    Family History  Problem Relation Age of Onset   Allergic rhinitis Father    Allergic rhinitis Sister    Asthma Sister    Endometrial cancer Sister 28   Multiple myeloma Sister    Cancer Maternal Grandmother    Asthma Paternal Aunt    Angioedema Neg Hx    Eczema Neg Hx    Immunodeficiency Neg Hx    Urticaria Neg Hx    Ovarian cancer Neg Hx    Breast cancer Neg Hx    Colon cancer Neg Hx    Prostate cancer Neg Hx    Pancreatic cancer Neg Hx     Social History   Socioeconomic History   Marital status: Married    Spouse name: Not on file   Number of children: Not on file   Years of education: Not on file   Highest education level: Not on file  Occupational History   Occupation: retored   Tobacco Use   Smoking status: Never   Smokeless tobacco: Never  Vaping Use   Vaping status: Never Used  Substance and Sexual Activity   Alcohol use: Yes    Comment: 2 glasses of wine/month   Drug use: No   Sexual activity: Yes    Birth control/protection: Post-menopausal  Other Topics Concern   Not on file  Social History Narrative   Not on file   Social Determinants of Health   Financial Resource Strain: Not on file  Food Insecurity: No Food Insecurity (08/05/2022)   Hunger Vital Sign    Worried About Running Out of Food in the Last Year: Never true    Ran Out of Food in the Last Year: Never true  Transportation Needs: No Transportation Needs (08/05/2022)   PRAPARE - Administrator, Civil Service (Medical): No    Lack of Transportation (Non-Medical): No  Physical Activity: Not on file  Stress: Not on file  Social Connections: Not on file    Current Medications:  Current Outpatient Medications:    5-Hydroxytryptophan (5-HTP PO), Take 1 tablet by mouth daily., Disp: , Rfl:    albuterol (VENTOLIN HFA) 108 (90 Base) MCG/ACT inhaler, Inhale 2 puffs into the lungs every 4 (four) hours as needed for wheezing or shortness of breath., Disp: ,  Rfl:    amLODipine (NORVASC) 5 MG tablet, Take 5 mg by mouth daily., Disp: , Rfl:    Azelastine HCl 137 MCG/SPRAY SOLN, USE 2 SPRAYS IN EACH NOSTRIL 2 TIMES A DAY AS DIRECTED, Disp: 30 mL, Rfl: 5   baclofen (LIORESAL) 20 MG tablet, Take 20 mg by mouth See admin instructions. Take 20 mg daily, may take up to 3 times daily as needed for spasms. Pt takes once a day, Disp: , Rfl:    CINNAMON PO, Take 1,000 mg by mouth in the morning and at bedtime., Disp: , Rfl:    diclofenac Sodium (VOLTAREN) 1 % GEL, Apply 2 g topically 4 (four) times daily as needed (pain)., Disp: , Rfl:    famotidine-calcium carbonate-magnesium hydroxide (PEPCID COMPLETE) 10-800-165 MG chewable tablet, Chew 1 tablet by mouth every evening., Disp: , Rfl:     fluticasone (FLONASE) 50 MCG/ACT nasal spray, Place 1 spray into both nostrils daily., Disp: , Rfl:    fluticasone (FLOVENT HFA) 44 MCG/ACT inhaler, INHALE 2 PUFFS INTO THE LUNGS IN THE MORNING AND AT BEDTIME. INHALE 1 PUFF BY MOUTH EVERY DAY, Disp: 10.6 each, Rfl: 5   gabapentin (NEURONTIN) 400 MG capsule, Take 400-800 mg by mouth See admin instructions. Take 400 mg in the morning and 800 mg at night, Disp: , Rfl:    glucose blood test strip, as directed finger stick once a day for 90 days, Disp: , Rfl:    hydrochlorothiazide (MICROZIDE) 12.5 MG capsule, Take 12.5 mg by mouth daily., Disp: , Rfl:    levocetirizine (XYZAL) 5 MG tablet, TAKE 1 TABLET BY MOUTH EVERY EVENING, Disp: 30 tablet, Rfl: 5   levothyroxine (SYNTHROID) 112 MCG tablet, Take 112 mcg by mouth daily before breakfast., Disp: , Rfl:    losartan (COZAAR) 100 MG tablet, Take 100 mg by mouth daily., Disp: , Rfl:    Melatonin 3 MG TABS, Take 3 mg by mouth at bedtime., Disp: , Rfl:    meloxicam (MOBIC) 7.5 MG tablet, Take 7.5 mg by mouth 2 (two) times daily as needed for pain., Disp: , Rfl:    Mometasone Furoate (ASMANEX HFA) 50 MCG/ACT AERO, 2 puffs twice a day., Disp: 13 g, Rfl: 5   montelukast (SINGULAIR) 10 MG tablet, TAKE 1 TABLET BY MOUTH EVERYDAY AT BEDTIME, Disp: 30 tablet, Rfl: 5   Multiple Vitamin (MULTIVITAMIN WITH MINERALS) TABS tablet, Take 1 tablet by mouth at bedtime., Disp: , Rfl:    Semaglutide, 2 MG/DOSE, (OZEMPIC, 2 MG/DOSE,) 8 MG/3ML SOPN, Inject 2 mg into the skin once a week., Disp: 9 mL, Rfl: 3   sertraline (ZOLOFT) 50 MG tablet, Take 50 mg by mouth daily., Disp: , Rfl:    simvastatin (ZOCOR) 20 MG tablet, Take 20 mg by mouth at bedtime. , Disp: , Rfl:    zolpidem (AMBIEN) 10 MG tablet, Take 5 mg by mouth at bedtime as needed for sleep. , Disp: , Rfl:   Review of Systems: Denies appetite changes, fevers, chills, fatigue, unexplained weight changes. Denies hearing loss, neck lumps or masses, mouth sores, ringing  in ears or voice changes. Denies cough or wheezing.  Denies shortness of breath. Denies chest pain or palpitations. Denies leg swelling. Denies abdominal distention, pain, blood in stools, constipation, diarrhea, nausea, vomiting, or early satiety. Denies pain with intercourse, dysuria, frequency, hematuria or incontinence. Denies hot flashes, pelvic pain, vaginal bleeding or vaginal discharge.   Denies joint pain, back pain or muscle pain/cramps. Denies itching, rash, or wounds. Denies dizziness,  headaches, numbness or seizures. Denies swollen lymph nodes or glands, denies easy bruising or bleeding. Denies anxiety, depression, confusion, or decreased concentration.  Physical Exam: BP (!) 145/55 (BP Location: Right Arm) Comment: notified doctor  Pulse 71   Temp 98.2 F (36.8 C) (Oral)   Resp 19   Ht 5\' 6"  (1.676 m)   Wt 241 lb 3.2 oz (109.4 kg)   LMP 07/06/1998 (Approximate)   SpO2 96%   BMI 38.93 kg/m  General: Alert, oriented, no acute distress. HEENT: Normocephalic, atraumatic, sclera anicteric. Chest: Clear to auscultation bilaterally.  No wheezes or rhonchi. Cardiovascular: Regular rate and rhythm, no murmurs. Abdomen: Obese, soft, nontender.  Normoactive bowel sounds.  No masses or hepatosplenomegaly appreciated.   Extremities: Grossly normal range of motion.  Warm, well perfused.  No edema bilaterally. Skin: No rashes or lesions noted. Lymphatics: No palpable cervical, supraclavicular, or inguinal adenopathy. GU: Normal-appearing external female genitalia with some skin changes related to prior excisions and biopsies.  No atypical vascularity or hyperpigmented lesions.  No erythema noted on exam today.  On speculum exam, vaginal mucosa is mildly atrophic, no visible lesions.  Laboratory & Radiologic Studies: CT C/A/P 02/16/23: 1. Stable examination without evidence of new or progressive disease within the chest, abdomen or pelvis. 2. Stable small pulmonary nodules. 3.   Aortic Atherosclerosis (ICD10-I70.0).  Assessment & Plan: [name] is a 72 y.o. woman with stage IB vulvar melanoma who underwent repeat excision in an attempt to achieve negative margins given MIS present. Unfortunately, margins still positive for melanoma in situ (11/2021). Patient was treated with Aldara with posttreatment biopsies showing no definite melanoma in situ or melanoma.    She is overall doing well without evidence of disease on exam today.     We have transition to CT scans every 6 months.  Her next will be in February.  This was ordered today.   I will see her back for surveillance follow-up in 3 months.  I reviewed signs and symptoms that should prompt a phone call before her next scheduled visit.  20 minutes of total time was spent for this patient encounter, including preparation, face-to-face counseling with the patient and coordination of care, and documentation of the encounter.  Eugene Garnet, MD  Division of Gynecologic Oncology  Department of Obstetrics and Gynecology  [facility]

## 2023-04-15 NOTE — Progress Notes (Signed)
Subjective:  Patient ID: [name], female    DOB: [birth year],  MRN: [id number]  Chief Complaint  Patient presents with   Nail Problem    " Seems to be going in the right direct and looking better" Left hallux , left 3rd toe-( "seems to have resolved")    72 y.o. female presents with the above complaint. History confirmed with patient.  She returns for follow-up she completed her cruise to [place], she notes that there is clearing the proximal border of the nail now  Objective:  Physical Exam: warm, good capillary refill, no trophic changes or ulcerative lesions, normal DP and PT pulses, normal sensory exam, and third toe has improved greatly there is clearing proximally at the nail plate of the left hallux as well there is no sign of Hutchinson sign or that this could be a melanoma at this point  Assessment:   1. Subungual hematoma of great toe of left foot, subsequent encounter      Plan:  Patient was evaluated and treated and all questions answered.  She is improving significantly and I do expect that this is a simple subungual hematoma or petechial hemorrhage at this point it is growing out there is clear nail and no evidence of this is a melanoma that is spreading there is no Hutchinson sign we compared today's appearance to her previous photos.  Would not recommend biopsy at this point but continue allow debridement which may take 6 to 7 months I discussed with her.  Also discussed if she has shows any signs of easy bleeding or bruising anywhere else in the body she should discussed with her PCP to evaluate, she does not take any aspirin or blood thinners at this point.  She will return to see me as needed.  Return if symptoms worsen or fail to improve.

## 2023-04-15 NOTE — Patient Instructions (Signed)
It was good to see you today.  I do not see or feel any evidence of cancer recurrence on your exam.  I will see you for follow-up in 3 months.  We will get you scheduled for your next CT scan in 6 months.  As always, if you develop any new and concerning symptoms before your next visit, please call to see me sooner.

## 2023-04-27 ENCOUNTER — Other Ambulatory Visit (HOSPITAL_COMMUNITY)
Admission: RE | Admit: 2023-04-27 | Discharge: 2023-04-27 | Disposition: A | Discharge status: Home / Self Care | Payer: PPO | Source: Ambulatory Visit | Attending: Oncology | Admitting: Oncology

## 2023-04-27 DIAGNOSIS — Z006 Encounter for examination for normal comparison and control in clinical research program: Secondary | ICD-10-CM | POA: Insufficient documentation

## 2023-05-07 LAB — GENECONNECT MOLECULAR SCREEN

## 2023-05-07 LAB — HELIX MOLECULAR SCREEN: Genetic Analysis Overall Interpretation: NEGATIVE

## 2023-05-12 DIAGNOSIS — N3946 Mixed incontinence: Secondary | ICD-10-CM | POA: Diagnosis not present

## 2023-06-15 DIAGNOSIS — E119 Type 2 diabetes mellitus without complications: Secondary | ICD-10-CM | POA: Diagnosis not present

## 2023-06-15 DIAGNOSIS — H524 Presbyopia: Secondary | ICD-10-CM | POA: Diagnosis not present

## 2023-06-17 DIAGNOSIS — N3946 Mixed incontinence: Secondary | ICD-10-CM | POA: Diagnosis not present

## 2023-06-17 DIAGNOSIS — R350 Frequency of micturition: Secondary | ICD-10-CM | POA: Diagnosis not present

## 2023-06-24 DIAGNOSIS — H109 Unspecified conjunctivitis: Secondary | ICD-10-CM | POA: Diagnosis not present

## 2023-07-06 DIAGNOSIS — M4722 Other spondylosis with radiculopathy, cervical region: Secondary | ICD-10-CM | POA: Diagnosis not present

## 2023-07-22 ENCOUNTER — Other Ambulatory Visit (HOSPITAL_BASED_OUTPATIENT_CLINIC_OR_DEPARTMENT_OTHER): Payer: Self-pay

## 2023-07-24 ENCOUNTER — Other Ambulatory Visit: Payer: Self-pay | Admitting: Allergy

## 2023-08-05 ENCOUNTER — Other Ambulatory Visit: Payer: Self-pay

## 2023-08-05 ENCOUNTER — Ambulatory Visit: Payer: PPO | Admitting: Allergy

## 2023-08-05 ENCOUNTER — Encounter: Payer: Self-pay | Admitting: Allergy

## 2023-08-05 VITALS — BP 122/76 | HR 62 | Temp 98.5°F | Resp 19 | Ht 64.25 in | Wt 238.8 lb

## 2023-08-05 DIAGNOSIS — H938X2 Other specified disorders of left ear: Secondary | ICD-10-CM

## 2023-08-05 DIAGNOSIS — J310 Chronic rhinitis: Secondary | ICD-10-CM | POA: Diagnosis not present

## 2023-08-05 DIAGNOSIS — J4530 Mild persistent asthma, uncomplicated: Secondary | ICD-10-CM

## 2023-08-05 NOTE — Progress Notes (Signed)
Follow-up Note  RE: [name] MRN: [id number] DOB: 1950-09-26 Date of Office Visit: 08/05/2023   History of present illness: [name] is a 73 y.o. female presenting today for follow-up of asthma and mixed rhinitis.  She was last seen in the office on 01/28/2023 by myself. Discussed the use of AI scribe software for clinical note transcription with the patient, who gave verbal consent to proceed.  The patient experiences decreased hearing in the left ear, which she first noticed when she was unable to hear her grandsons clearly. She describes the sensation as similar to 'getting off an airplane,' indicating a clogged feeling. There is no associated dizziness, but she does experience occasional popping and crackling sounds.  She has a history of using Flonase, last used in October, and currently uses Astelin nasal spray at night. She has not started Asmanex yet due to a surplus of Flovent, which she uses at a dose of one puff once a day. Additionally, she takes a half tablet of Xyzal daily.  Approximately ten years ago, she underwent a nasal procedure and was advised that it might need to be repeated in the future. She has not experienced significant allergy symptoms recently and has not frequently used her rescue inhaler. She has been vaccinated against RSV and COVID.      Review of systems: 10pt ROS negative unless noted above in HPI  All other systems negative unless noted above in HPI  Past medical/social/surgical/family history have been reviewed and are unchanged unless specifically indicated below.  No changes  Medication List: Current Outpatient Medications  Medication Sig Dispense Refill   5-Hydroxytryptophan (5-HTP PO) Take 1 tablet by mouth daily.     albuterol (VENTOLIN HFA) 108 (90 Base) MCG/ACT inhaler Inhale 2 puffs into the lungs every 4 (four) hours as needed for wheezing or shortness of breath.     amLODipine (NORVASC) 5 MG tablet Take 5 mg by mouth daily.      Azelastine HCl 137 MCG/SPRAY SOLN USE 2 SPRAYS IN EACH NOSTRIL 2 TIMES A DAY AS DIRECTED 30 mL 5   baclofen (LIORESAL) 20 MG tablet Take 20 mg by mouth See admin instructions. Take 20 mg daily, may take up to 3 times daily as needed for spasms. Pt takes once a day     CINNAMON PO Take 1,000 mg by mouth in the morning and at bedtime.     famotidine-calcium carbonate-magnesium hydroxide (PEPCID COMPLETE) 10-800-165 MG chewable tablet Chew 1 tablet by mouth every evening.     fluticasone (FLONASE) 50 MCG/ACT nasal spray Place 1 spray into both nostrils daily.     fluticasone (FLOVENT HFA) 44 MCG/ACT inhaler INHALE 2 PUFFS INTO THE LUNGS IN THE MORNING AND AT BEDTIME. INHALE 1 PUFF BY MOUTH EVERY DAY 10.6 each 5   gabapentin (NEURONTIN) 400 MG capsule Take 400-800 mg by mouth See admin instructions. Take 400 mg in the morning and 800 mg at night     GEMTESA 75 MG TABS 1 tablet Orally Once a day for 30 day(s)     glucose blood test strip as directed finger stick once a day for 90 days     hydrochlorothiazide (MICROZIDE) 12.5 MG capsule Take 12.5 mg by mouth daily.     levocetirizine (XYZAL) 5 MG tablet TAKE 1 TABLET BY MOUTH EVERY DAY IN THE EVENING 90 tablet 1   levothyroxine (SYNTHROID) 112 MCG tablet Take 112 mcg by mouth daily before breakfast.     losartan (COZAAR)  100 MG tablet Take 100 mg by mouth daily.     Melatonin 3 MG TABS Take 3 mg by mouth at bedtime.     meloxicam (MOBIC) 7.5 MG tablet Take 7.5 mg by mouth 2 (two) times daily as needed for pain.     Mometasone Furoate (ASMANEX HFA) 50 MCG/ACT AERO 2 puffs twice a day. 13 g 5   montelukast (SINGULAIR) 10 MG tablet TAKE 1 TABLET BY MOUTH EVERYDAY AT BEDTIME 30 tablet 5   Multiple Vitamin (MULTIVITAMIN WITH MINERALS) TABS tablet Take 1 tablet by mouth at bedtime.     Semaglutide, 2 MG/DOSE, (OZEMPIC, 2 MG/DOSE,) 8 MG/3ML SOPN Inject 2 mg into the skin once a week. 9 mL 3   sertraline (ZOLOFT) 50 MG tablet Take 50 mg by mouth daily.      simvastatin (ZOCOR) 20 MG tablet Take 20 mg by mouth at bedtime.      Turmeric 500 MG CAPS 1 capsule Orally once a day     zolpidem (AMBIEN) 10 MG tablet Take 5 mg by mouth at bedtime as needed for sleep.      diclofenac Sodium (VOLTAREN) 1 % GEL Apply 2 g topically 4 (four) times daily as needed (pain). (Patient not taking: Reported on 08/05/2023)     No current facility-administered medications for this visit.     Known medication allergies: Allergies  Allergen Reactions   Oxycodone-Acetaminophen Nausea Only     Physical examination: Blood pressure 122/76, pulse 62, temperature 98.5 F (36.9 C), temperature source Temporal, resp. rate 19, height 5' 4.25" (1.632 m), weight 238 lb 12.8 oz (108.3 kg), last menstrual period 07/06/1998, SpO2 94%.  General: Alert, interactive, in no acute distress. HEENT: PERRLA, left ear with visible fluid behind the TM with some cerumen obstructing the canal, right TM is normal, turbinates non-edematous without discharge, post-pharynx non erythematous. Neck: Supple without lymphadenopathy. Lungs: Clear to auscultation without wheezing, rhonchi or rales. {no increased work of breathing. CV: Normal S1, S2 without murmurs. Abdomen: Nondistended, nontender. Skin: Warm and dry, without lesions or rashes. Extremities:  No clubbing, cyanosis or edema. Neuro:   Grossly intact.  Diagnositics/Labs:   Spirometry: FEV1: 2.26L 100%, FVC: 2.45L 84%, ratio consistent with nonobstructive pattern   Assessment and plan: Asthma  - continue Flovent 44 g 1 puff once a day at this time.  Once you are out of Flovent changed to the Asmanex 1 puff once a day.  - asthma action plan if having asthma flare or respiratory illness: take Flovent or Asmanex 2 puffs twice a day   - Continue Singulair 10 mg daily  - have access to albuterol inhaler 2 puffs every 4-6 hours as needed for cough/wheeze/shortness of breath/chest tightness.  May use 15-20 minutes prior to activity.    Monitor frequency of use.    Asthma control goals:  Full participation in all desired activities (may need albuterol before activity) Albuterol use two time or less a week on average (not counting use with activity) Cough interfering with sleep two time or less a month Oral steroids no more than once a year No hospitalizations    Mixed rhinitis  - Continue Singulair as above  - Continue Xyzal 1/2 tab daily to every other day.    - Continue Flonase 1-2 sprays each nostril daily as needed for congestion.  Use for 1-2 weeks at a time before stopping once symptoms improved.   - Continue Astelin 1-2 sprays 1-2 times a day as needed for nasal drainage/post-nasal  drip  Clogged ear - Left ear with visible fluid and cerumen in the canal.  Recommended using Flonase consistently at this time.  Continue to use Debrox earwax kit at home to help loosen and remove the wax from the canal   Follow-up in 6 months or sooner if needed  I appreciate the opportunity to take part in [place] care. Please do not hesitate to contact me with questions.  Sincerely,   Margo Aye, MD Allergy/Immunology [facility]

## 2023-08-05 NOTE — Patient Instructions (Addendum)
Asthma  - continue Flovent 44 g 1 puff once a day at this time.  Once you are out of Flovent changed to the Asmanex 1 puff once a day.  - asthma action plan if having asthma flare or respiratory illness: take Flovent or Asmanex 2 puffs twice a day   - Continue Singulair 10 mg daily  - have access to albuterol inhaler 2 puffs every 4-6 hours as needed for cough/wheeze/shortness of breath/chest tightness.  May use 15-20 minutes prior to activity.   Monitor frequency of use.    Asthma control goals:  Full participation in all desired activities (may need albuterol before activity) Albuterol use two time or less a week on average (not counting use with activity) Cough interfering with sleep two time or less a month Oral steroids no more than once a year No hospitalizations    Mixed rhinitis  - Continue Singulair as above  - Continue Xyzal 1/2 tab daily to every other day.    - Continue Flonase 1-2 sprays each nostril daily as needed for congestion.  Use for 1-2 weeks at a time before stopping once symptoms improved.   - Continue Astelin 1-2 sprays 1-2 times a day as needed for nasal drainage/post-nasal drip  Clogged ear - Left ear with visible fluid and cerumen in the canal.  Recommended using Flonase consistently at this time.  Continue to use Debrox earwax kit at home to help loosen and remove the wax from the canal   Follow-up in 6 months or sooner if needed

## 2023-08-05 NOTE — Progress Notes (Signed)
spi

## 2023-08-23 ENCOUNTER — Ambulatory Visit (HOSPITAL_COMMUNITY)
Admission: RE | Admit: 2023-08-23 | Discharge: 2023-08-23 | Disposition: A | Discharge status: Home / Self Care | Payer: PPO | Source: Ambulatory Visit | Attending: Gynecologic Oncology | Admitting: Gynecologic Oncology

## 2023-08-23 ENCOUNTER — Encounter (HOSPITAL_COMMUNITY): Payer: Self-pay

## 2023-08-23 DIAGNOSIS — C519 Malignant neoplasm of vulva, unspecified: Secondary | ICD-10-CM | POA: Diagnosis present

## 2023-08-23 DIAGNOSIS — E119 Type 2 diabetes mellitus without complications: Secondary | ICD-10-CM

## 2023-08-23 LAB — POCT I-STAT CREATININE: Creatinine, Ser: 0.8 mg/dL (ref 0.44–1.00)

## 2023-08-23 MED ORDER — IOHEXOL 300 MG/ML  SOLN
30.0000 mL | Freq: Once | INTRAMUSCULAR | Status: AC | PRN
  Administered 2023-08-23: 30 mL via ORAL

## 2023-08-23 MED ORDER — IOHEXOL 300 MG/ML  SOLN
100.0000 mL | Freq: Once | INTRAMUSCULAR | Status: AC | PRN
  Administered 2023-08-23: 100 mL via INTRAVENOUS

## 2023-08-23 MED ORDER — IOHEXOL 300 MG/ML  SOLN: 30.0000 mL | Freq: Once | INTRAMUSCULAR | Status: DC | PRN

## 2023-08-24 ENCOUNTER — Encounter: Payer: Self-pay | Admitting: Gynecologic Oncology

## 2023-08-27 ENCOUNTER — Inpatient Hospital Stay: Payer: PPO | Attending: Gynecologic Oncology | Admitting: Gynecologic Oncology

## 2023-08-27 ENCOUNTER — Encounter: Payer: Self-pay | Admitting: Gynecologic Oncology

## 2023-08-27 VITALS — BP 132/70 | HR 77 | Temp 98.7°F | Resp 18 | Wt 240.8 lb

## 2023-08-27 DIAGNOSIS — Z9079 Acquired absence of other genital organ(s): Secondary | ICD-10-CM | POA: Diagnosis not present

## 2023-08-27 DIAGNOSIS — Z8544 Personal history of malignant neoplasm of other female genital organs: Secondary | ICD-10-CM | POA: Insufficient documentation

## 2023-08-27 DIAGNOSIS — C519 Malignant neoplasm of vulva, unspecified: Secondary | ICD-10-CM

## 2023-08-27 NOTE — Progress Notes (Signed)
Gynecologic Oncology Return Clinic Visit  08/27/23  Reason for Visit: surveillance   Treatment History: 08/12/21: Vulvar biopsy revealed vulvar melanoma   09/12/21: Mild hypermetabolism in the lower vulva likely related to recent biopsy/surgery.  No discrete hypermetabolic mass.  No adenopathy.   11/04/2021: Left vulvar wide local excision and left inguinal sentinel lymph node biopsy with Surgical Oncology. Operative Findings: 1 cm melanotic lesion on medial aspect of anterior left labia minora. 1 sentinel lymph node identified in the subcutaneous fat superficial to the external oblique.  Pathology:  Diagnosis  A: Sentinel lymph node, left inguinal, removal - Negative for melanoma in one lymph node (0/1). B: Vulva, excision - Malignant melanoma. Melanoma in situ extends broadly to 11-12-10 o'clock peripheral margin and focally to the 3 o'clock peripheral margin. Margins are negative for invasive melanoma. Type: Vulvar Clark Level: IV Breslow thickness: 1.8 mm Growth phase: Vertical, spindled melanocytes with light pigment Mitoses: per square millimeter Nuclear grade: 2/3 Tumor infiltrating lymphocytes:  Regression: Not identified Ulceration: Not identified  Microscopic satellite: Not identified Vascular invasion: Not identified Perineural invasion: Not identified Associated nevus: Not identified  Margins: Melanoma in situ extends broadly to 11-12-10 o'clock peripheral margin (B3-B5) and focally to the 3 o'clock peripheral margin (B3). Margins are negative for invasive melanoma Pathologic (pT, AJCC 8th edition) staging: pT2a C: Vulva, deep margin, excision - Connective tissue.  - Negative for melanoma   11/19/2021 Tumor Board recommendations.  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 Plan: Repeat excision of vulvar melanoma in situ  - Medical Oncology referral  for consideration of additional treatment (observation vs pembro vs locoregional radiation), scheduled 5/22 Medical oncology recommendations: CT C/A/P q 3 months for 1 year then q 6 months until year 3, then annual. Consider imiquimod for the in situ component if the margins on reexcision were to show melanoma in situ. Obtain full molecular profile with Tempus testing - testing showed no targetable mutations or obvious germline mutation.   12/24/2021: Partial simple left anterior vulvectomy given margins positive for vulvar melanoma in situ.  Prior scar identified, no definitive evidence visually of melanoma lesion.  mL in situ noted involving all margins.  No residual invasive disease noted.   02/2022: CT chest, abdomen, pelvis shows multiple bilateral pulmonary nodules including previously described 5 mm left lower lobe nodule.  There is suggestion of mild increase in size of 8 mm left upper lobe nodule.  Nonspecific, recommended close follow-up.  Interval postsurgical changes in the left inguinal region.  Too small to characterize 9 mm low-attenuation lesion in the left hepatic lobe, recommended attention on follow-up.   05/17/22: CT C/A/P:  1. Interval increase in size of prominent left external iliac and inguinal lymph nodes including 1 directly adjacent to the left inguinal surgical clips, nonspecific and possibly reactive but suspicious for nodal metastatic disease.  2. Stable nonspecific bilateral pulmonary nodules, continued attention on follow-up imaging suggested. 3. Stable 9 mm hypodensity in the left hepatic lobe, technically too small to accurately characterize but distinctly not typical in appearance for that of a melanoma metastasis favored to reflect a cyst or hemangioma. Suggest continued attention on follow-up imaging. 4. Increased prominence of the left gonadal vein and pelvic collateral vessels, which can be seen in the setting of pelvic venous insufficiency. 5. Hepatic  steatosis. 6. Cholelithiasis without findings of acute cholecystitis. 7.  Aortic Atherosclerosis (ICD10-I70.0).   07/17/22: Vulvar  and peri-clitoral biopsies after Aldara treatment A. VULVA, LEFT LATERAL, 1:00, BIOPSY: NO EVIDENCE OF MELANOMA B. VULVA, LEFT LATERAL, 2:00, BIOPSY: ATYPICAL LENTIGINOUS MELANOCYTIC PROLIFERATION, SEE COMMENT C. VULVA, LEFT LATERAL, 3:00, BIOPSY: ATYPICAL LENTIGINOUS MELANOCYTIC PROLIFERATION, SEE COMMENT D. VULVA, LEFT, SUPERIOR CLITORAL FOLD, BIOPSY: SCATTERED JUNCTIONAL MELANOCYTES, NOT DIAGNOSTIC OF MALIGNANCY, SEE COMMENT E. VULVA, LEFT LATERAL, CLITORIS, BIOPSY: NO EVIDENCE OF MELANOMA F. VULVA, LEFT MEDIAL, 3:00, BIOPSY: NO EVIDENCE OF MELANOMA G. VULVA, LEFT CENTRAL, BIOPSY: NO EVIDENCE OF MELANOMA COMMENT: Numerous levels of each biopsy were evaluated by H and E as well as MelanA stain. Negative biopsies are noted above. In biopsies at 2 and 3 o'clock, there is an increase in junctional melanocytes, but without upward growth nor nest formation. This is an atypical lentiginous melanocytic proliferation. It may represent partially-treated previous melanoma but is not diagnostic for such. The superior clitoral fold exhibits scattered junctional melanocytes, which are evenly spaced and not diagnostic of malignancy. Followup and/or additional biopsies are recommended. Drs. Maryelizabeth Kaufmann and Juel Burrow have a similar opinion.    08/14/22: CT C/A/P: 1. No convincing evidence of new or progressive metastatic disease within the chest, abdomen or pelvis. 2. Low-density filling defect in the left upper lobe lateral segmental bronchus with corresponding atelectasis and new prominent left hilar nodal tissue, nonspecific findings which are favored to reflect aspiration/mucous in the airway with reactive hilar nodal tissue. However while felt less likely a metastatic endobronchial lesion is not entirely excluded and warrants further evaluation with short-term interval follow-up chest CT  versus bronchoscopy. 3. Stable bilateral pulmonary nodules, favored benign. Continued attention on follow-up imaging suggested. 4. Cholelithiasis without findings of acute cholecystitis. 5.  Aortic Atherosclerosis (ICD10-I70.0).   10/23/22: Multiple vulvar biopsies A. CLITORIS, LAT, BIOPSY:  -  Skin with mild dermal fibrosis, otherwise no significant pathology.  -  No morphologic evidence of melanocytic proliferation (clinical  history of melanoma noted).  B. VULVA, LAT, 2:00, BIOPSY:  -  Skin with mild dermal fibrosis, otherwise no significant pathology  -  No morphologic evidence of melanocytic proliferation (clinical  history of melanoma noted).   11/2022: CT C/A/P -stable bilateral pulmonary nodules, no evidence of new or progressive disease.  01/29/2023: Vulvar biopsy at 1:00 shows reactive squamous mucosa with spongiosis, hyperparakeratosis and mild chronic inflammation.  No dysplasia or melanocytic atypia.  02/2023: CT C/A/P -stable bilateral pulmonary nodules, no evidence of new or progressive disease.  08/24/23: CT C/A/P -stable bilateral pulmonary nodules, no evidence of new or progressive disease.  Cholelithiasis without acute cholecystitis.  Colonic diverticulosis noted.  Interval History: Doing well.  Denies any new vulvar symptoms including vulvar pain, pruritus, lesions, bleeding, or discharge.  Past Medical/Surgical History: Past Medical History:  Diagnosis Date   Asthma    has rescue inhaler, now on Flovent   Chronic back pain    Diabetes mellitus without complication (HCC)    on medformin, on Ozempic   GERD (gastroesophageal reflux disease)    History of COVID-19 04/05/2021   Hyperlipemia    Hypertension    Hypothyroidism    Insomnia    melanoma 11/2021   vulva   OAB (overactive bladder)    Seasonal allergies    Sleep apnea    uses a cpap    Past Surgical History:  Procedure Laterality Date   ANKLE ARTHROTOMY  2003   left-fx   APPENDECTOMY     back  fusion  02/14/2019   L4 back fusion   BACK SURGERY  2010   lumb  disk   BREAST LUMPECTOMY WITH RADIOACTIVE SEED LOCALIZATION Left 06/13/2019   Procedure: LEFT BREAST LUMPECTOMY WITH RADIOACTIVE SEED LOCALIZATION;  Surgeon: Harriette Bouillon, MD;  Location: [facility];  Service: General;  Laterality: Left;   CARPAL TUNNEL RELEASE     rt   CARPAL TUNNEL RELEASE Left 08/01/2013   Procedure: LEFT CARPAL TUNNEL RELEASE;  Surgeon: Nicki Reaper, MD;  Location: [facility];  Service: Orthopedics;  Laterality: Left;   CESAREAN SECTION  83,86   COLONOSCOPY     DILATATION & CURETTAGE/HYSTEROSCOPY WITH MYOSURE N/A 08/23/2018   Procedure: DILATATION & CURETTAGE/HYSTEROSCOPY WITH MYOSURE POLYPECTOMY;  Surgeon: Patton Salles, MD;  Location: [facility];  Service: Gynecology;  Laterality: N/A;  fibroid resection   DILATION AND CURETTAGE OF UTERUS     x2   MASS EXCISION N/A 08/04/2017   Procedure: EXCISION LIPOMA UPPER BACK x2 AND NECK;  Surgeon: Harriette Bouillon, MD;  Location: [facility];  Service: General;  Laterality: N/A;   NASAL SEPTOPLASTY W/ TURBINOPLASTY Bilateral 06/21/2015   Procedure: NASAL SEPTOPLASTY WITH BILATERAL TURBINATE REDUCTION;  Surgeon: Osborn Coho, MD;  Location: [facility];  Service: ENT;  Laterality: Bilateral;   SINOSCOPY     TONSILLECTOMY     TUBAL LIGATION     VULVECTOMY N/A 12/24/2021   Procedure: WIDE EXCISION VULVECTOMY;  Surgeon: Carver Fila, MD;  Location: [facility];  Service: Gynecology;  Laterality: N/A;    Family History  Problem Relation Age of Onset   Allergic rhinitis Father    Allergic rhinitis Sister    Asthma Sister    Endometrial cancer Sister 33   Multiple myeloma Sister    Cancer Maternal Grandmother    Asthma Paternal Aunt    Angioedema Neg Hx    Eczema Neg Hx    Immunodeficiency Neg Hx    Urticaria Neg Hx    Ovarian cancer Neg Hx    Breast cancer Neg Hx     Colon cancer Neg Hx    Prostate cancer Neg Hx    Pancreatic cancer Neg Hx     Social History   Socioeconomic History   Marital status: Married    Spouse name: Not on file   Number of children: Not on file   Years of education: Not on file   Highest education level: Not on file  Occupational History   Occupation: retored  Tobacco Use   Smoking status: Never   Smokeless tobacco: Never  Vaping Use   Vaping status: Never Used  Substance and Sexual Activity   Alcohol use: Yes    Comment: 2 glasses of wine/month   Drug use: No   Sexual activity: Yes    Birth control/protection: Post-menopausal  Other Topics Concern   Not on file  Social History Narrative   Not on file   Social Drivers of Health   Financial Resource Strain: Not on file  Food Insecurity: No Food Insecurity (08/05/2022)   Hunger Vital Sign    Worried About Running Out of Food in the Last Year: Never true    Ran Out of Food in the Last Year: Never true  Transportation Needs: No Transportation Needs (08/05/2022)   PRAPARE - Administrator, Civil Service (Medical): No    Lack of Transportation (Non-Medical): No  Physical Activity: Not on file  Stress: Not on file  Social Connections: Not on file    Current Medications:  Current Outpatient  Medications:    5-Hydroxytryptophan (5-HTP PO), Take 1 tablet by mouth daily., Disp: , Rfl:    albuterol (VENTOLIN HFA) 108 (90 Base) MCG/ACT inhaler, Inhale 2 puffs into the lungs every 4 (four) hours as needed for wheezing or shortness of breath., Disp: , Rfl:    amLODipine (NORVASC) 5 MG tablet, Take 5 mg by mouth daily., Disp: , Rfl:    Azelastine HCl 137 MCG/SPRAY SOLN, USE 2 SPRAYS IN EACH NOSTRIL 2 TIMES A DAY AS DIRECTED, Disp: 30 mL, Rfl: 5   baclofen (LIORESAL) 20 MG tablet, Take 20 mg by mouth See admin instructions. Take 20 mg daily, may take up to 3 times daily as needed for spasms. Pt takes once a day, Disp: , Rfl:    CINNAMON PO, Take 1,000 mg by  mouth in the morning and at bedtime., Disp: , Rfl:    diclofenac Sodium (VOLTAREN) 1 % GEL, Apply 2 g topically 4 (four) times daily as needed (pain)., Disp: , Rfl:    famotidine-calcium carbonate-magnesium hydroxide (PEPCID COMPLETE) 10-800-165 MG chewable tablet, Chew 1 tablet by mouth every evening., Disp: , Rfl:    fluticasone (FLONASE) 50 MCG/ACT nasal spray, Place 1 spray into both nostrils daily., Disp: , Rfl:    fluticasone (FLOVENT HFA) 44 MCG/ACT inhaler, INHALE 2 PUFFS INTO THE LUNGS IN THE MORNING AND AT BEDTIME. INHALE 1 PUFF BY MOUTH EVERY DAY, Disp: 10.6 each, Rfl: 5   gabapentin (NEURONTIN) 400 MG capsule, Take 400-800 mg by mouth See admin instructions. Take 400 mg in the morning and 800 mg at night, Disp: , Rfl:    GEMTESA 75 MG TABS, 1 tablet Orally Once a day for 30 day(s), Disp: , Rfl:    glucose blood test strip, as directed finger stick once a day for 90 days, Disp: , Rfl:    hydrochlorothiazide (MICROZIDE) 12.5 MG capsule, Take 12.5 mg by mouth daily., Disp: , Rfl:    levocetirizine (XYZAL) 5 MG tablet, TAKE 1 TABLET BY MOUTH EVERY DAY IN THE EVENING, Disp: 90 tablet, Rfl: 1   levothyroxine (SYNTHROID) 112 MCG tablet, Take 112 mcg by mouth daily before breakfast., Disp: , Rfl:    losartan (COZAAR) 100 MG tablet, Take 100 mg by mouth daily., Disp: , Rfl:    Melatonin 3 MG TABS, Take 3 mg by mouth at bedtime., Disp: , Rfl:    meloxicam (MOBIC) 7.5 MG tablet, Take 7.5 mg by mouth 2 (two) times daily as needed for pain., Disp: , Rfl:    Mometasone Furoate (ASMANEX HFA) 50 MCG/ACT AERO, 2 puffs twice a day., Disp: 13 g, Rfl: 5   montelukast (SINGULAIR) 10 MG tablet, TAKE 1 TABLET BY MOUTH EVERYDAY AT BEDTIME, Disp: 30 tablet, Rfl: 5   Multiple Vitamin (MULTIVITAMIN WITH MINERALS) TABS tablet, Take 1 tablet by mouth at bedtime., Disp: , Rfl:    oseltamivir (TAMIFLU) 75 MG capsule, 1 capsule Orally once a day for 10 days, Disp: , Rfl:    Semaglutide, 2 MG/DOSE, (OZEMPIC, 2  MG/DOSE,) 8 MG/3ML SOPN, Inject 2 mg into the skin once a week., Disp: 9 mL, Rfl: 3   sertraline (ZOLOFT) 50 MG tablet, Take 50 mg by mouth daily., Disp: , Rfl:    simvastatin (ZOCOR) 20 MG tablet, Take 20 mg by mouth at bedtime. , Disp: , Rfl:    Turmeric 500 MG CAPS, 1 capsule Orally once a day, Disp: , Rfl:    zolpidem (AMBIEN) 10 MG tablet, Take 5 mg by mouth at  bedtime as needed for sleep. , Disp: , Rfl:   Review of Systems: Denies appetite changes, fevers, chills, fatigue, unexplained weight changes. Denies hearing loss, neck lumps or masses, mouth sores, ringing in ears or voice changes. Denies cough or wheezing.  Denies shortness of breath. Denies chest pain or palpitations. Denies leg swelling. Denies abdominal distention, pain, blood in stools, constipation, diarrhea, nausea, vomiting, or early satiety. Denies pain with intercourse, dysuria, frequency, hematuria or incontinence. Denies hot flashes, pelvic pain, vaginal bleeding or vaginal discharge.   Denies joint pain, back pain or muscle pain/cramps. Denies itching, rash, or wounds. Denies dizziness, headaches, numbness or seizures. Denies swollen lymph nodes or glands, denies easy bruising or bleeding. Denies anxiety, depression, confusion, or decreased concentration.  Physical Exam: BP (!) 156/56 (BP Location: Left Arm, Patient Position: Sitting) Comment: Notified RN  Pulse 77   Temp 98.7 F (37.1 C) (Oral)   Resp 18   Wt 240 lb 12.8 oz (109.2 kg)   LMP 07/06/1998 (Approximate)   SpO2 97%   BMI 41.01 kg/m  General: Alert, oriented, no acute distress. HEENT: Normocephalic, atraumatic, sclera anicteric. Chest: Clear to auscultation bilaterally.  No wheezes or rhonchi. Cardiovascular: Regular rate and rhythm, no murmurs. Abdomen: Obese, soft, nontender.  Normoactive bowel sounds.  No masses or hepatosplenomegaly appreciated.   Extremities: Grossly normal range of motion.  Warm, well perfused.  No edema  bilaterally. Skin: No rashes or lesions noted. Lymphatics: No palpable cervical, supraclavicular, or inguinal adenopathy. GU: Normal-appearing external female genitalia with some skin changes related to prior excisions and biopsies.  No atypical vascularity or hyperpigmented lesions.  No erythema noted on exam today.  Examination with Wood's Lamp performed. On speculum exam, vaginal mucosa is mildly atrophic, no visible lesions.  Laboratory & Radiologic Studies: See recent CT in above treatment summary  Assessment & Plan: [name] is a 73 y.o. woman with a history of stage IB vulvar melanoma who underwent repeat excision in an attempt to achieve negative margins given MIS present. Unfortunately, margins still positive for melanoma in situ (11/2021). Patient was treated with Aldara with post-treatment biopsies (07/2022) showing no definite melanoma in situ or melanoma.  CT in 08/2023 showed stable pulmonary nodules, no evidence of metastatic disease.   She is overall doing well without evidence of disease on exam today.     We have transitioned to CT scans every 6 months.  Her next will be in August.  This was ordered today.   I will see her back for surveillance follow-up in 3 months.  I reviewed signs and symptoms that should prompt a phone call before her next scheduled visit.  20 minutes of total time was spent for this patient encounter, including preparation, face-to-face counseling with the patient and coordination of care, and documentation of the encounter.  Eugene Garnet, MD  Division of Gynecologic Oncology  Department of Obstetrics and Gynecology  [facility]

## 2023-08-27 NOTE — Patient Instructions (Signed)
 It was good to see you today.  I do not see or feel any evidence of cancer recurrence on your exam.  I will see you for follow-up in 3 months.  As always, if you develop any new and concerning symptoms before your next visit, please call to see me sooner.

## 2023-09-06 ENCOUNTER — Other Ambulatory Visit (HOSPITAL_COMMUNITY): Payer: PPO

## 2023-09-09 DIAGNOSIS — Z0000 Encounter for general adult medical examination without abnormal findings: Secondary | ICD-10-CM | POA: Diagnosis not present

## 2023-09-09 DIAGNOSIS — G4733 Obstructive sleep apnea (adult) (pediatric): Secondary | ICD-10-CM | POA: Diagnosis not present

## 2023-09-09 DIAGNOSIS — I10 Essential (primary) hypertension: Secondary | ICD-10-CM | POA: Diagnosis not present

## 2023-09-09 DIAGNOSIS — Z1331 Encounter for screening for depression: Secondary | ICD-10-CM | POA: Diagnosis not present

## 2023-09-09 DIAGNOSIS — C519 Malignant neoplasm of vulva, unspecified: Secondary | ICD-10-CM | POA: Diagnosis not present

## 2023-09-09 DIAGNOSIS — E782 Mixed hyperlipidemia: Secondary | ICD-10-CM | POA: Diagnosis not present

## 2023-09-09 DIAGNOSIS — E1142 Type 2 diabetes mellitus with diabetic polyneuropathy: Secondary | ICD-10-CM | POA: Diagnosis not present

## 2023-09-09 DIAGNOSIS — J4530 Mild persistent asthma, uncomplicated: Secondary | ICD-10-CM | POA: Diagnosis not present

## 2023-09-09 DIAGNOSIS — E039 Hypothyroidism, unspecified: Secondary | ICD-10-CM | POA: Diagnosis not present

## 2023-09-09 DIAGNOSIS — N3281 Overactive bladder: Secondary | ICD-10-CM | POA: Diagnosis not present

## 2023-09-09 DIAGNOSIS — G4700 Insomnia, unspecified: Secondary | ICD-10-CM | POA: Diagnosis not present

## 2023-09-28 ENCOUNTER — Telehealth: Payer: Self-pay | Admitting: *Deleted

## 2023-09-28 NOTE — Telephone Encounter (Signed)
 Spoke with the patient and moved appt from 5/8 to 5/29

## 2023-10-03 DIAGNOSIS — G4733 Obstructive sleep apnea (adult) (pediatric): Secondary | ICD-10-CM | POA: Diagnosis not present

## 2023-10-04 ENCOUNTER — Ambulatory Visit: Payer: Self-pay | Admitting: Surgery

## 2023-10-04 DIAGNOSIS — D171 Benign lipomatous neoplasm of skin and subcutaneous tissue of trunk: Secondary | ICD-10-CM | POA: Diagnosis not present

## 2023-10-12 DIAGNOSIS — C510 Malignant neoplasm of labium majus: Secondary | ICD-10-CM | POA: Diagnosis not present

## 2023-10-12 DIAGNOSIS — L821 Other seborrheic keratosis: Secondary | ICD-10-CM | POA: Diagnosis not present

## 2023-10-12 DIAGNOSIS — B009 Herpesviral infection, unspecified: Secondary | ICD-10-CM | POA: Diagnosis not present

## 2023-10-12 DIAGNOSIS — D239 Other benign neoplasm of skin, unspecified: Secondary | ICD-10-CM | POA: Diagnosis not present

## 2023-10-12 DIAGNOSIS — D225 Melanocytic nevi of trunk: Secondary | ICD-10-CM | POA: Diagnosis not present

## 2023-10-12 DIAGNOSIS — L814 Other melanin hyperpigmentation: Secondary | ICD-10-CM | POA: Diagnosis not present

## 2023-10-12 DIAGNOSIS — L578 Other skin changes due to chronic exposure to nonionizing radiation: Secondary | ICD-10-CM | POA: Diagnosis not present

## 2023-10-27 DIAGNOSIS — N3946 Mixed incontinence: Secondary | ICD-10-CM | POA: Diagnosis not present

## 2023-10-27 DIAGNOSIS — R350 Frequency of micturition: Secondary | ICD-10-CM | POA: Diagnosis not present

## 2023-11-03 ENCOUNTER — Other Ambulatory Visit: Payer: Self-pay

## 2023-11-03 ENCOUNTER — Encounter (HOSPITAL_BASED_OUTPATIENT_CLINIC_OR_DEPARTMENT_OTHER): Payer: Self-pay | Admitting: Surgery

## 2023-11-03 NOTE — Progress Notes (Signed)
   11/03/23 1341  Pre-op Phone Call  Surgery Date Verified 11/09/23  Arrival Time Verified 0800  Surgery Location Verified [facility] [place]  Medical History Reviewed Yes  Is the patient taking a GLP-1 receptor agonist? Yes  Has the patient been informed on holding medication? Yes  Does the patient have diabetes? Type II  Does the patient use a Continuous Blood Glucose Monitor? No  Is the patient on an insulin pump? No  Has the diabetes coordinator been notified? No  Do you have a history of heart problems? No  Does patient have other implanted devices? No  Patient Teaching Enhanced Recovery;Pre / Post Procedure  Patient educated about smoking cessation 24 hours prior to surgery. N/A Non-Smoker  Patient verbalizes understanding of bowel prep? N/A  Med Rec Completed Yes  Take the Following Meds the Morning of Surgery synthroid and amlodipine with sip water  AM of sx; hold herb/vit/supp/NSAIDS x5d  Recent  Lab Work, EKG, CXR? No  NPO (Including gum & candy) After midnight  Patient instructed to stop clear liquids including Carb loading drink at: 0600  Stop Solids, Milk, Candy, and Gum STARTING AT MIDNIGHT  Responsible adult to drive and be with you for 24 hours? Yes  Name & Phone Number for Ride/Caregiver husband howard  No Jewelry, money, nail polish or make-up.  No lotions, powders, perfumes. No shaving  48 hrs. prior to surgery. Yes  Contacts, Dentures & Glasses Will Have to be Removed Before OR. Yes  Please bring your ID and Insurance Card the morning of your surgery. (Surgery Centers Only) Yes  Bring any papers or x-rays with you that your surgeon gave you. Yes  Instructed to contact the location of procedure/ provider if they or anyone in their household develops symptoms or tests positive for COVID-19, has close contact with someone who tests positive for COVID, or has known exposure to any contagious illness. Yes  Call this number the morning of surgery  with any problems that may cancel your  surgery. [phone]  Covid-19 Assessment  Have you had a positive COVID-19 test within the previous 90 days? No  COVID Testing Guidance Proceed with the additional questions.  Patient's surgery required a COVID-19 test (cardiothoracic, complex ENT, and bronchoscopies/ EBUS) No  Have you been unmasked and in close contact with anyone with COVID-19 or COVID-19 symptoms within the past 10 days? No  Do you or anyone in your household currently have any COVID-19 symptoms? No

## 2023-11-05 ENCOUNTER — Encounter (HOSPITAL_BASED_OUTPATIENT_CLINIC_OR_DEPARTMENT_OTHER)
Admission: RE | Admit: 2023-11-05 | Discharge: 2023-11-05 | Disposition: A | Discharge status: Home / Self Care | Source: Ambulatory Visit | Attending: Surgery | Admitting: Surgery

## 2023-11-05 DIAGNOSIS — Z419 Encounter for procedure for purposes other than remedying health state, unspecified: Secondary | ICD-10-CM | POA: Insufficient documentation

## 2023-11-05 DIAGNOSIS — Z01818 Encounter for other preprocedural examination: Secondary | ICD-10-CM | POA: Insufficient documentation

## 2023-11-05 LAB — BASIC METABOLIC PANEL WITH GFR
Anion gap: 9 (ref 5–15)
BUN: 11 mg/dL (ref 8–23)
CO2: 26 mmol/L (ref 22–32)
Calcium: 9.1 mg/dL (ref 8.9–10.3)
Chloride: 105 mmol/L (ref 98–111)
Creatinine, Ser: 0.7 mg/dL (ref 0.44–1.00)
GFR, Estimated: >60 mL/min (ref >60)
Glucose, Bld: 154 mg/dL — ABNORMAL HIGH (ref 70–99)
Potassium: 4.2 mmol/L (ref 3.5–5.1)
Sodium: 140 mmol/L (ref 135–145)

## 2023-11-05 MED ORDER — CHLORHEXIDINE GLUCONATE CLOTH 2 % EX PADS: 6.0000 | MEDICATED_PAD | Freq: Once | CUTANEOUS | Status: DC

## 2023-11-05 NOTE — Progress Notes (Signed)

## 2023-11-08 NOTE — Anesthesia Preprocedure Evaluation (Signed)
 Anesthesia Evaluation  Patient identified by MRN, date of birth, ID band Patient awake    Reviewed: Allergy & Precautions, NPO status , Patient's Chart, lab work & pertinent test results  Airway Mallampati: III  TM Distance: >3 FB Neck ROM: Full    Dental no notable dental hx. (+) Teeth Intact, Dental Advisory Given   Pulmonary asthma , sleep apnea and Continuous Positive Airway Pressure Ventilation    Pulmonary exam normal breath sounds clear to auscultation       Cardiovascular hypertension, Pt. on medications Normal cardiovascular exam Rhythm:Regular Rate:Normal     Neuro/Psych  PSYCHIATRIC DISORDERS  Depression    negative neurological ROS     GI/Hepatic Neg liver ROS,GERD  Medicated and Controlled,,  Endo/Other  diabetes, Well Controlled, Type 2, Oral Hypoglycemic AgentsHypothyroidism  Class 3 obesity (BMI 40)  Renal/GU negative Renal ROS Bladder dysfunction (OAB)      Musculoskeletal negative musculoskeletal ROS (+)    Abdominal  (+) + obese  Peds  Hematology negative hematology ROS (+)   Anesthesia Other Findings   Reproductive/Obstetrics negative OB ROS                             Anesthesia Physical Anesthesia Plan  ASA: 3  Anesthesia Plan: General   Post-op Pain Management: Tylenol  PO (pre-op)*   Induction: Intravenous  PONV Risk Score and Plan: 3 and Ondansetron , Dexamethasone , Midazolam  and Treatment may vary due to age or medical condition  Airway Management Planned: Oral ETT  Additional Equipment: None  Intra-op Plan:   Post-operative Plan: Extubation in OR  Informed Consent:   Plan Discussed with:   Anesthesia Plan Comments:        Anesthesia Quick Evaluation

## 2023-11-09 ENCOUNTER — Ambulatory Visit (HOSPITAL_BASED_OUTPATIENT_CLINIC_OR_DEPARTMENT_OTHER)
Admission: RE | Admit: 2023-11-09 | Discharge: 2023-11-09 | Disposition: A | Discharge status: Home / Self Care | Attending: Surgery | Admitting: Surgery

## 2023-11-09 ENCOUNTER — Encounter (HOSPITAL_BASED_OUTPATIENT_CLINIC_OR_DEPARTMENT_OTHER): Payer: Self-pay | Admitting: Surgery

## 2023-11-09 ENCOUNTER — Other Ambulatory Visit: Payer: Self-pay

## 2023-11-09 ENCOUNTER — Ambulatory Visit (HOSPITAL_BASED_OUTPATIENT_CLINIC_OR_DEPARTMENT_OTHER): Payer: Self-pay | Admitting: Certified Registered"

## 2023-11-09 ENCOUNTER — Encounter (HOSPITAL_BASED_OUTPATIENT_CLINIC_OR_DEPARTMENT_OTHER)
Admission: RE | Disposition: A | Discharge status: Home / Self Care | Payer: Self-pay | Source: Home / Self Care | Attending: Surgery

## 2023-11-09 DIAGNOSIS — D171 Benign lipomatous neoplasm of skin and subcutaneous tissue of trunk: Secondary | ICD-10-CM

## 2023-11-09 DIAGNOSIS — Z7985 Long-term (current) use of injectable non-insulin antidiabetic drugs: Secondary | ICD-10-CM | POA: Diagnosis not present

## 2023-11-09 DIAGNOSIS — I10 Essential (primary) hypertension: Secondary | ICD-10-CM

## 2023-11-09 DIAGNOSIS — K219 Gastro-esophageal reflux disease without esophagitis: Secondary | ICD-10-CM | POA: Insufficient documentation

## 2023-11-09 DIAGNOSIS — E66813 Obesity, class 3: Secondary | ICD-10-CM | POA: Insufficient documentation

## 2023-11-09 DIAGNOSIS — E039 Hypothyroidism, unspecified: Secondary | ICD-10-CM | POA: Insufficient documentation

## 2023-11-09 DIAGNOSIS — Z8349 Family history of other endocrine, nutritional and metabolic diseases: Secondary | ICD-10-CM | POA: Insufficient documentation

## 2023-11-09 DIAGNOSIS — Z833 Family history of diabetes mellitus: Secondary | ICD-10-CM | POA: Diagnosis not present

## 2023-11-09 DIAGNOSIS — Z79899 Other long term (current) drug therapy: Secondary | ICD-10-CM | POA: Diagnosis not present

## 2023-11-09 DIAGNOSIS — G4730 Sleep apnea, unspecified: Secondary | ICD-10-CM | POA: Diagnosis not present

## 2023-11-09 DIAGNOSIS — N3281 Overactive bladder: Secondary | ICD-10-CM | POA: Insufficient documentation

## 2023-11-09 DIAGNOSIS — Z9889 Other specified postprocedural states: Secondary | ICD-10-CM | POA: Insufficient documentation

## 2023-11-09 DIAGNOSIS — Z6841 Body Mass Index (BMI) 40.0 and over, adult: Secondary | ICD-10-CM | POA: Insufficient documentation

## 2023-11-09 DIAGNOSIS — J45909 Unspecified asthma, uncomplicated: Secondary | ICD-10-CM | POA: Insufficient documentation

## 2023-11-09 DIAGNOSIS — D170 Benign lipomatous neoplasm of skin and subcutaneous tissue of head, face and neck: Secondary | ICD-10-CM | POA: Insufficient documentation

## 2023-11-09 DIAGNOSIS — Z8249 Family history of ischemic heart disease and other diseases of the circulatory system: Secondary | ICD-10-CM | POA: Diagnosis not present

## 2023-11-09 DIAGNOSIS — F32A Depression, unspecified: Secondary | ICD-10-CM | POA: Diagnosis not present

## 2023-11-09 DIAGNOSIS — E119 Type 2 diabetes mellitus without complications: Secondary | ICD-10-CM | POA: Insufficient documentation

## 2023-11-09 DIAGNOSIS — Z419 Encounter for procedure for purposes other than remedying health state, unspecified: Secondary | ICD-10-CM

## 2023-11-09 HISTORY — PX: EXCISION MASS, BACK: SHX7560

## 2023-11-09 HISTORY — PX: EXCISION MASS NECK: SHX6703

## 2023-11-09 LAB — GLUCOSE, CAPILLARY
Glucose-Capillary: 133 mg/dL — ABNORMAL HIGH (ref 70–99)
Glucose-Capillary: 145 mg/dL — ABNORMAL HIGH (ref 70–99)

## 2023-11-09 SURGERY — EXCISION, MASS, NECK
Anesthesia: General

## 2023-11-09 MED ORDER — EPHEDRINE 5 MG/ML INJ
INTRAVENOUS | Status: AC
  Filled 2023-11-09: qty 5

## 2023-11-09 MED ORDER — CELECOXIB 200 MG PO CAPS
ORAL_CAPSULE | ORAL | Status: AC
  Filled 2023-11-09: qty 1

## 2023-11-09 MED ORDER — ROCURONIUM BROMIDE 10 MG/ML (PF) SYRINGE
PREFILLED_SYRINGE | INTRAVENOUS | Status: AC
  Filled 2023-11-09: qty 10

## 2023-11-09 MED ORDER — PHENYLEPHRINE 80 MCG/ML (10ML) SYRINGE FOR IV PUSH (FOR BLOOD PRESSURE SUPPORT)
PREFILLED_SYRINGE | INTRAVENOUS | Status: AC
  Filled 2023-11-09: qty 10

## 2023-11-09 MED ORDER — IBUPROFEN 800 MG PO TABS: 800.0000 mg | ORAL_TABLET | Freq: Three times a day (TID) | ORAL | 0 refills | Status: DC | PRN

## 2023-11-09 MED ORDER — ROCURONIUM BROMIDE 100 MG/10ML IV SOLN
INTRAVENOUS | Status: DC | PRN
  Administered 2023-11-09: 40 mg via INTRAVENOUS

## 2023-11-09 MED ORDER — HYDROMORPHONE HCL 1 MG/ML IJ SOLN: 0.2500 mg | INTRAMUSCULAR | Status: DC | PRN

## 2023-11-09 MED ORDER — HYDROCODONE-ACETAMINOPHEN 5-325 MG PO TABS
ORAL_TABLET | ORAL | Status: AC
  Filled 2023-11-09: qty 2

## 2023-11-09 MED ORDER — GABAPENTIN 300 MG PO CAPS
ORAL_CAPSULE | ORAL | Status: AC
  Filled 2023-11-09: qty 1

## 2023-11-09 MED ORDER — PROPOFOL 10 MG/ML IV BOLUS
INTRAVENOUS | Status: DC | PRN
  Administered 2023-11-09: 100 mg via INTRAVENOUS
  Administered 2023-11-09: 200 mg via INTRAVENOUS

## 2023-11-09 MED ORDER — ONDANSETRON HCL 4 MG/2ML IJ SOLN
INTRAMUSCULAR | Status: AC
  Filled 2023-11-09: qty 2

## 2023-11-09 MED ORDER — ONDANSETRON HCL 4 MG/2ML IJ SOLN: 4.0000 mg | Freq: Once | INTRAMUSCULAR | Status: DC | PRN

## 2023-11-09 MED ORDER — FENTANYL CITRATE (PF) 100 MCG/2ML IJ SOLN
INTRAMUSCULAR | Status: AC
  Filled 2023-11-09: qty 2

## 2023-11-09 MED ORDER — DEXAMETHASONE SODIUM PHOSPHATE 10 MG/ML IJ SOLN
INTRAMUSCULAR | Status: DC | PRN
  Administered 2023-11-09: 10 mg via INTRAVENOUS

## 2023-11-09 MED ORDER — SUGAMMADEX SODIUM 200 MG/2ML IV SOLN
INTRAVENOUS | Status: DC | PRN
Start: 2023-11-09 — End: 2023-11-09
  Administered 2023-11-09: 300 mg via INTRAVENOUS

## 2023-11-09 MED ORDER — CEFAZOLIN SODIUM-DEXTROSE 2-4 GM/100ML-% IV SOLN
INTRAVENOUS | Status: AC
  Filled 2023-11-09: qty 100

## 2023-11-09 MED ORDER — FENTANYL CITRATE (PF) 100 MCG/2ML IJ SOLN
INTRAMUSCULAR | Status: DC | PRN
  Administered 2023-11-09 (×3): 50 ug via INTRAVENOUS

## 2023-11-09 MED ORDER — PHENYLEPHRINE HCL (PRESSORS) 10 MG/ML IV SOLN
INTRAVENOUS | Status: DC | PRN
  Administered 2023-11-09 (×2): 80 ug via INTRAVENOUS
  Administered 2023-11-09: 160 ug via INTRAVENOUS
  Administered 2023-11-09 (×3): 80 ug via INTRAVENOUS

## 2023-11-09 MED ORDER — ACETAMINOPHEN 500 MG PO TABS
ORAL_TABLET | ORAL | Status: AC
  Filled 2023-11-09: qty 2

## 2023-11-09 MED ORDER — LIDOCAINE 2% (20 MG/ML) 5 ML SYRINGE
INTRAMUSCULAR | Status: DC | PRN
  Administered 2023-11-09: 100 mg via INTRAVENOUS

## 2023-11-09 MED ORDER — 0.9 % SODIUM CHLORIDE (POUR BTL) OPTIME
TOPICAL | Status: DC | PRN
  Administered 2023-11-09: 1000 mL

## 2023-11-09 MED ORDER — LACTATED RINGERS IV SOLN: INTRAVENOUS | Status: DC

## 2023-11-09 MED ORDER — AMISULPRIDE (ANTIEMETIC) 5 MG/2ML IV SOLN: 10.0000 mg | Freq: Once | INTRAVENOUS | Status: DC | PRN

## 2023-11-09 MED ORDER — PROPOFOL 10 MG/ML IV BOLUS
INTRAVENOUS | Status: AC
  Filled 2023-11-09: qty 20

## 2023-11-09 MED ORDER — TRAMADOL HCL 50 MG PO TABS: 50.0000 mg | ORAL_TABLET | Freq: Four times a day (QID) | ORAL | 0 refills | Status: DC | PRN

## 2023-11-09 MED ORDER — HYDROCODONE-ACETAMINOPHEN 7.5-325 MG PO TABS
1.0000 | ORAL_TABLET | Freq: Once | ORAL | Status: AC | PRN
  Administered 2023-11-09: 1 via ORAL

## 2023-11-09 MED ORDER — ACETAMINOPHEN 500 MG PO TABS
1000.0000 mg | ORAL_TABLET | Freq: Once | ORAL | Status: AC
  Administered 2023-11-09: 1000 mg via ORAL

## 2023-11-09 MED ORDER — ONDANSETRON HCL 4 MG/2ML IJ SOLN
INTRAMUSCULAR | Status: DC | PRN
  Administered 2023-11-09: 4 mg via INTRAVENOUS

## 2023-11-09 MED ORDER — GABAPENTIN 300 MG PO CAPS
300.0000 mg | ORAL_CAPSULE | ORAL | Status: AC
  Administered 2023-11-09: 300 mg via ORAL

## 2023-11-09 MED ORDER — BUPIVACAINE-EPINEPHRINE 0.25% -1:200000 IJ SOLN
INTRAMUSCULAR | Status: DC | PRN
  Administered 2023-11-09: 20 mL

## 2023-11-09 MED ORDER — CEFAZOLIN SODIUM-DEXTROSE 2-4 GM/100ML-% IV SOLN
2.0000 g | INTRAVENOUS | Status: AC
  Administered 2023-11-09: 2 g via INTRAVENOUS

## 2023-11-09 MED ORDER — CELECOXIB 200 MG PO CAPS
200.0000 mg | ORAL_CAPSULE | ORAL | Status: AC
Start: 2023-11-09 — End: 2023-11-09
  Administered 2023-11-09: 200 mg via ORAL

## 2023-11-09 MED ORDER — DEXAMETHASONE SODIUM PHOSPHATE 10 MG/ML IJ SOLN
INTRAMUSCULAR | Status: AC
  Filled 2023-11-09: qty 1

## 2023-11-09 MED ORDER — LIDOCAINE 2% (20 MG/ML) 5 ML SYRINGE
INTRAMUSCULAR | Status: AC
  Filled 2023-11-09: qty 5

## 2023-11-09 MED ORDER — EPHEDRINE SULFATE (PRESSORS) 50 MG/ML IJ SOLN
INTRAMUSCULAR | Status: DC | PRN
  Administered 2023-11-09: 10 mg via INTRAVENOUS
  Administered 2023-11-09: 5 mg via INTRAVENOUS
  Administered 2023-11-09: 10 mg via INTRAVENOUS

## 2023-11-09 SURGICAL SUPPLY — 41 items
BENZOIN TINCTURE PRP APPL 2/3 (GAUZE/BANDAGES/DRESSINGS) IMPLANT
BIOPATCH RED 1 DISK 7.0 (GAUZE/BANDAGES/DRESSINGS) IMPLANT
BLADE SURG 10 STRL SS (BLADE) IMPLANT
BLADE SURG 15 STRL LF DISP TIS (BLADE) ×2 IMPLANT
BNDG ELASTIC 4INX 5YD STR LF (GAUZE/BANDAGES/DRESSINGS) IMPLANT
CANISTER SUCT 1200ML W/VALVE (MISCELLANEOUS) IMPLANT
CHLORAPREP W/TINT 26 (MISCELLANEOUS) ×2 IMPLANT
COVER BACK TABLE 60X90IN (DRAPES) ×2 IMPLANT
COVER MAYO STAND STRL (DRAPES) ×2 IMPLANT
DERMABOND ADVANCED .7 DNX12 (GAUZE/BANDAGES/DRESSINGS) IMPLANT
DRAIN CHANNEL 19F RND (DRAIN) IMPLANT
DRAPE LAPAROTOMY 100X72 PEDS (DRAPES) ×2 IMPLANT
DRAPE UTILITY XL STRL (DRAPES) ×2 IMPLANT
DRSG TEGADERM 2-3/8X2-3/4 SM (GAUZE/BANDAGES/DRESSINGS) IMPLANT
ELECT COATED BLADE 2.86 ST (ELECTRODE) ×2 IMPLANT
ELECTRODE REM PT RTRN 9FT ADLT (ELECTROSURGICAL) ×2 IMPLANT
EVACUATOR SILICONE 100CC (DRAIN) IMPLANT
GLOVE BIOGEL PI IND STRL 8 (GLOVE) ×2 IMPLANT
GLOVE ECLIPSE 8.0 STRL XLNG CF (GLOVE) ×2 IMPLANT
GOWN STRL REUS W/ TWL LRG LVL3 (GOWN DISPOSABLE) ×4 IMPLANT
GOWN STRL REUS W/ TWL XL LVL3 (GOWN DISPOSABLE) ×2 IMPLANT
NDL HYPO 25X1 1.5 SAFETY (NEEDLE) ×2 IMPLANT
NEEDLE HYPO 25X1 1.5 SAFETY (NEEDLE) ×2 IMPLANT
NS IRRIG 1000ML POUR BTL (IV SOLUTION) IMPLANT
PACK BASIN DAY SURGERY FS (CUSTOM PROCEDURE TRAY) ×2 IMPLANT
PENCIL SMOKE EVACUATOR (MISCELLANEOUS) ×2 IMPLANT
SLEEVE SCD COMPRESS KNEE MED (STOCKING) ×2 IMPLANT
SPIKE FLUID TRANSFER (MISCELLANEOUS) IMPLANT
SPONGE T-LAP 18X18 ~~LOC~~+RFID (SPONGE) IMPLANT
SPONGE T-LAP 4X18 ~~LOC~~+RFID (SPONGE) IMPLANT
STAPLER SKIN PROX WIDE 3.9 (STAPLE) IMPLANT
STRIP CLOSURE SKIN 1/2X4 (GAUZE/BANDAGES/DRESSINGS) IMPLANT
SUT ETHILON 2 0 FS 18 (SUTURE) IMPLANT
SUT MON AB 4-0 PC3 18 (SUTURE) ×2 IMPLANT
SUT VICRYL 3-0 CR8 SH (SUTURE) ×2 IMPLANT
SUT VICRYL AB 3 0 TIES (SUTURE) IMPLANT
SYR BULB IRRIG 60ML STRL (SYRINGE) IMPLANT
SYR CONTROL 10ML LL (SYRINGE) ×2 IMPLANT
TOWEL GREEN STERILE FF (TOWEL DISPOSABLE) ×4 IMPLANT
TUBE CONNECTING 20X1/4 (TUBING) IMPLANT
YANKAUER SUCT BULB TIP NO VENT (SUCTIONS) IMPLANT

## 2023-11-09 NOTE — Interval H&P Note (Signed)
 History and Physical Interval Note:  11/09/2023 8:58 AM  [name]  has presented today for surgery, with the diagnosis of LIPOMA ON NECK, LIPOMA ON BACK.  The various methods of treatment have been discussed with the patient and family. After consideration of risks, benefits and other options for treatment, the patient has consented to  Procedure(s) with comments: EXCISION, MASS, NECK (N/A) EXCISION MASS, BACK (Bilateral) - EXCISION LIPOMA POSTERIOR RIGHT AND LEFT OF BACK as a surgical intervention.  The patient's history has been reviewed, patient examined, no change in status, stable for surgery.  I have reviewed the patient's chart and labs.  Questions were answered to the patient's satisfaction.     [name] [name]

## 2023-11-09 NOTE — Discharge Instructions (Addendum)
 #######################################################  GENERAL SURGERY: POST OP INSTRUCTIONS  ######################################################################  EAT Gradually transition to a high fiber diet with a fiber supplement over the next few weeks after discharge.  Start with a pureed / full liquid diet (see below)  WALK Walk an hour a day.  Control your pain to do that.    CONTROL PAIN Control pain so that you can walk, sleep, tolerate sneezing/coughing, go up/down stairs.  HAVE A BOWEL MOVEMENT DAILY Keep your bowels regular to avoid problems.  OK to try a laxative to override constipation.  OK to use an antidairrheal to slow down diarrhea.  Call if not better after 2 tries  CALL IF YOU HAVE PROBLEMS/CONCERNS Call if you are still struggling despite following these instructions. Call if you have concerns not answered by these instructions  ######################################################################    DIET: Follow a light bland diet & liquids the first 24 hours after arrival home, such as soup, liquids, starches, etc.  Be sure to drink plenty of fluids.  Quickly advance to a usual solid diet within a few days.  Avoid fast food or heavy meals as your are more likely to get nauseated or have irregular bowels.  A low-fat, high-fiber diet for the rest of your life is ideal.    Take your usually prescribed home medications unless otherwise directed. Blood thinners:  You can restart any strong blood thinners after the second postoperative day  for example: COUMADIN (warfarin), XERELTO (rivaroxaban), ELIQUIS (apixaban), PLAVIX (clopidigrel), BRILINTA (ticagrelor), EFFIENT (prasugrel), PRADAXA (dabigatran), etc  Continue aspirin  before & after surgery..     Some oozing/bleeding the first 1-2 weeks is common but should taper down & be small volume.    If you are passing many large clots or having uncontrolling bleeding, call your surgeon  PAIN CONTROL: Pain  is best controlled by a usual combination of three different methods TOGETHER: Ice/Heat Over the counter pain medication Prescription pain medication Most patients will experience some swelling and bruising around the incisions.  Ice packs or heating pads (30-60 minutes up to 6 times a day) will help. Use ice for the first few days to help decrease swelling and bruising, then switch to heat to help relax tight/sore spots and speed recovery.  Some people prefer to use ice alone, heat alone, alternating between ice & heat.  Experiment to what works for you.  Swelling and bruising can take several weeks to resolve.   It is helpful to take an over-the-counter pain medication regularly for the first few weeks.  Choose one of the following that works best for you: Naproxen (Aleve, etc)  Two 220mg  tabs twice a day Ibuprofen  (Advil , etc) Three 200mg  tabs four times a day (every meal & bedtime) Acetaminophen  (Tylenol , etc) 500-650mg  four times a day (every meal & bedtime) A  prescription for pain medication (such as oxycodone , hydrocodone , etc) should be given to you upon discharge.  Take your pain medication as prescribed.  If you are having problems/concerns with the prescription medicine (does not control pain, nausea, vomiting, rash, itching, etc), please call us  [phone] to see if we need to switch you to a different pain medicine that will work better for you and/or control your side effect better. If you need a refill on your pain medication, please contact your pharmacy.  They will contact our office to request authorization. Prescriptions will not be filled after 5 pm or on week-ends.  Avoid getting constipated.  Between the surgery and the pain medications, it is  common to experience some constipation.  Increasing fluid intake and taking a fiber supplement (such as Metamucil, Citrucel, FiberCon, MiraLax, etc) 1-2 times a day regularly will usually help prevent this problem from occurring.  A mild  laxative (prune juice, Milk of Magnesia, MiraLax, etc) should be taken according to package directions if there are no bowel movements after 48 hours.   Watch out for diarrhea.  If you have many loose bowel movements, simplify your diet to bland foods & liquids for a few days.  Stop any stool softeners and decrease your fiber supplement.  Switching to mild anti-diarrheal medications (Loperamide/Imodium, Kayopectate, Pepto Bismol) can help.  If this worsens or does not improve, please call us .  Wash / shower every day.  You may shower over the dressings as they are waterproof.  Continue to shower over incision(s) after the dressing is off. Remove your waterproof bandages 5 days after surgery.  You may leave the incision open to air.  You may have skin tapes (Steri Strips) covering the incision(s).  Leave them on until one week, then remove.  You may replace a dressing/Band-Aid to cover the incision for comfort if you wish.   ACTIVITIES as tolerated:   You may resume regular (light) daily activities beginning the next day--such as daily self-care, walking, climbing stairs--gradually increasing activities as tolerated.  If you can walk 30 minutes without difficulty, it is safe to try more intense activity such as jogging, treadmill, bicycling, low-impact aerobics, swimming, etc. Save the most intensive and strenuous activity for last such as sit-ups, heavy lifting, contact sports, etc  Refrain from any heavy lifting or straining until you are off narcotics for pain control.   DO NOT PUSH THROUGH PAIN.  Let pain be your guide: If it hurts to do something, don't do it.  Pain is your body warning you to avoid that activity for another week until the pain goes down. You may drive when you are no longer taking prescription pain medication, you can comfortably wear a seatbelt, and you can safely maneuver your car and apply brakes. You may have sexual intercourse when it is comfortable.   FOLLOW UP in our  office Please call [facility] at [phone] to set up an appointment to see your surgeon in the office for a follow-up appointment approximately 2-3 weeks after your surgery. Make sure that you call for this appointment the day you arrive home to insure a convenient appointment time.  9. IF YOU HAVE DISABILITY OR FAMILY LEAVE FORMS, BRING THEM TO THE OFFICE FOR PROCESSING.  DO NOT GIVE THEM TO YOUR DOCTOR.   WHEN TO CALL US  ([phone]) [phone]: Poor pain control Reactions / problems with new medications (rash/itching, nausea, etc)  Fever over 101.5 F (38.5 C) Worsening swelling or bruising Continued bleeding from incision. Increased pain, redness, or drainage from the incision Difficulty breathing / swallowing   The clinic staff is available to answer your questions during regular business hours (8:30am-5pm).  Please don't hesitate to call and ask to speak to one of our nurses for clinical concerns.   If you have a medical emergency, go to the nearest emergency room or call 911.  A surgeon from [facility] is always on call at the [facility], [place] [address], [place], [place]  [address] ? MAIN: [phone] ? TOLL FREE: [phone] ?  FAX [phone] [email or web address]  #######################################################    No Tylenol  until after 2:30 PM today  No Ibuprofen  until after 4:30 PM today    Post Anesthesia Home Care Instructions  Activity: Get plenty of rest for the remainder of the day. A responsible individual must stay with you for 24 hours following the procedure.  For the next 24 hours, DO NOT: -Drive a car -Advertising copywriter -Drink alcoholic beverages -Take any medication unless instructed by your physician -Make any legal decisions or sign important papers.  Meals: Start with liquid foods such as gelatin or soup. Progress to regular foods as tolerated. Avoid greasy, spicy, heavy  foods. If nausea and/or vomiting occur, drink only clear liquids until the nausea and/or vomiting subsides. Call your physician if vomiting continues.  Special Instructions/Symptoms: Your throat may feel dry or sore from the anesthesia or the breathing tube placed in your throat during surgery. If this causes discomfort, gargle with warm salt water . The discomfort should disappear within 24 hours.  If you had a scopolamine  patch placed behind your ear for the management of post- operative nausea and/or vomiting:  1. The medication in the patch is effective for 72 hours, after which it should be removed.  Wrap patch in a tissue and discard in the trash. Wash hands thoroughly with soap and water . 2. You may remove the patch earlier than 72 hours if you experience unpleasant side effects which may include dry mouth, dizziness or visual disturbances. 3. Avoid touching the patch. Wash your hands with soap and water  after contact with the patch.

## 2023-11-09 NOTE — H&P (Signed)
 History of Present Illness: [name] is a 73 y.o. female who is seen today as an office consultation for evaluation of New Consultation  Patient sent for evaluation of lipomas over her posterior neck, right upper posterior back, left upper posterior back. She had these excised in 2019 with they have recurred. They have gotten quite large and uncomfortable.  Review of Systems: A complete review of systems was obtained from the patient. I have reviewed this information and discussed as appropriate with the patient. See HPI as well for other ROS.    Medical History: Past Medical History:  Diagnosis Date  Asthma, unspecified asthma severity, unspecified whether complicated, unspecified whether persistent (HHS-HCC)  Diabetes mellitus without complication (CMS/HHS-HCC)  GERD (gastroesophageal reflux disease)  History of cancer  Hypertension  Sleep apnea  Thyroid  disease   There is no problem list on file for this patient.  Past Surgical History:  Procedure Laterality Date  Ankle surgery  CESAREAN SECTION  Lipoma removal  SPINE SURGERY  Wrist surgery    No Known Allergies  Current Outpatient Medications on File Prior to Visit  Medication Sig Dispense Refill  amLODIPine (NORVASC) 5 MG tablet Take 5 mg by mouth once daily  cinnamon bark, bulk, Powd 2 capsules Orally once a day  diclofenac (VOLTAREN) 1 % topical gel Apply 2 g topically  DOCOSAHEXAENOIC ACID ORAL  famotidine-calcium  carbonate-magnesium hydroxide (PEPCID COMPLETE) 10-800-165 mg Chew chewable tablet Take 1 tablet by mouth  fluticasone  propionate (FLOVENT  HFA) 44 mcg/actuation inhaler Inhale 2 inhalations into the lungs  gabapentin  (NEURONTIN ) 400 MG capsule Take 400-800 mg by mouth  GEMTESA 75 mg Tab 1 tablet Orally Once a day for 30 day(s)  hydroCHLOROthiazide (MICROZIDE) 12.5 mg capsule Take 12.5 mg by mouth once daily  levocetirizine (XYZAL ) 5 MG tablet Take 1 tablet by mouth every evening  levothyroxine  (SYNTHROID) 112 MCG tablet Take 112 mcg by mouth  losartan (COZAAR) 100 MG tablet Take 100 mg by mouth once daily  melatonin 3 mg tablet Take 3 mg by mouth at bedtime  meloxicam (MOBIC) 7.5 MG tablet Take 7.5 mg by mouth  montelukast  (SINGULAIR ) 10 mg tablet Take 1 tablet by mouth at bedtime  OZEMPIC  2 mg/dose (8 mg/3 mL) pen injector Inject 2 mg subcutaneously  sertraline (ZOLOFT) 50 MG tablet Take 50 mg by mouth once daily  simvastatin (ZOCOR) 20 MG tablet take 1 tablet by mouth every day in the evening for 90 days  turmeric root extract 500 mg Cap Take 1 capsule by mouth once daily  zolpidem (AMBIEN) 10 mg tablet TAKE 1/2-1 TABLET BY MOUTH AT BEDTIME AS NEEDED FOR RESTLESSNES   No current facility-administered medications on file prior to visit.   Family History  Problem Relation Age of Onset  High blood pressure (Hypertension) Mother  Coronary Artery Disease (Blocked arteries around heart) Mother  Diabetes Mother  High blood pressure (Hypertension) Father  Coronary Artery Disease (Blocked arteries around heart) Father  Diabetes Father  Skin cancer Sister  Obesity Sister  High blood pressure (Hypertension) Sister  Diabetes Sister  Obesity Brother  High blood pressure (Hypertension) Brother    Social History   Tobacco Use  Smoking Status Never  Smokeless Tobacco Never    Social History   Socioeconomic History  Marital status: Married  Tobacco Use  Smoking status: Never  Smokeless tobacco: Never  Substance and Sexual Activity  Alcohol use: Yes  Drug use: Never   Social Drivers of Health   Food Insecurity: No Food  Insecurity (08/05/2022)  Received from [facility]  Hunger Vital Sign  Worried About Running Out of Food in the Last Year: Never true  Ran Out of Food in the Last Year: Never true  Transportation Needs: No Transportation Needs (08/05/2022)  Received from [facility] - Transportation  Lack of Transportation (Medical): No  Lack of  Transportation (Non-Medical): No  Housing Stability: Unknown (10/04/2023)  Housing Stability Vital Sign  Homeless in the Last Year: No   Objective:   Vitals:  10/04/23 0932 10/04/23 0937  BP: 138/82  Pulse: 92  Temp: 36.7 C (98.1 F)  SpO2: 93%  Weight: (!) 109.1 kg (240 lb 9.6 oz)  Height: 167.6 cm (5\' 6" )  PainSc: 5   Body mass index is 38.83 kg/m.  Physical Exam HENT:  Head: Normocephalic.  Cardiovascular:  Rate and Rhythm: Normal rate.  Pulmonary:  Effort: Pulmonary effort is normal.  Skin:   Comments: Posterior neck 5 cm mobile fatty mass consistent with lipoma, right posterior upper back 5 cm mobile mass consistent lipoma, left upper back 6 cm mobile mass lipoma  Neurological:  General: No focal deficit present.  Mental Status: She is alert.  Psychiatric:  Mood and Affect: Mood normal.     Assessment and Plan:   Diagnoses and all orders for this visit:  Lipoma of back   Patient desires excision of lipoma of upper back and neck due to pain. Risk of bleeding, infection, recurrence, cosmetic deformity, wound complicating issues, injury to neighboring structures the need for other treatment Lyndon Santiago procedures reviewed today with the patient.   Sharlee Dawes, MD

## 2023-11-09 NOTE — Op Note (Addendum)
 Preoperative diagnosis: Right upper back 5 cm lipoma, left upper back 6 cm lipoma, posterior neck 5 cm lipoma subcutaneous  Postoperative diagnosis: 12 cm x 5 cm upper back lipoma encompassing the right upper back lipoma and left upper back lipoma, 5 cm lipoma neck all subcutaneous.    Procedure: Excision of upper back lipoma measuring 12 cm x 5 cm encompassing both the right upper back and left upper back lipoma with 1 incision, excision of posterior neck lipoma through second incision 5 cm subcutaneous  Surgeon: Sim Dryer, MD  Anesthesia: General with 0.25 % marcaine  with epinephrine    EBL: Minimal  Specimen: Above lipomas sent as one  due to multiple lobules  Drains: 19 round  Indications for procedure: The patient is a 73 year old female with diabetes who has a history of lipomas on her upper back.  These were excised in the past but have recurred.  On exam, she had an area of lipoma disease in the right upper back where her previous excision was, a second area was on the left upper back and a third was in the posterior neck.  She desired excision of these 3 areas due to discomfort.  Risks and benefits reviewed as well as the potential risk of recurrence which is already occurred.  Observation offered to the patient as well given the nature of the finding.  After discussion of all the above surgeries to proceed with excision of all areas involved.The procedure has been discussed with the patient.  Alternative therapies have been discussed with the patient.  Operative risks include bleeding,  Infection,  Organ injury,  Nerve injury,  Blood vessel injury,  DVT,  Pulmonary embolism,  Death,  And possible reoperation.  Medical management risks include worsening of present situation.  The success of the procedure is 50 -90 % at treating patients symptoms.  The patient understands and agrees to proceed.    Description of procedure: The patient was met in the holding area and questions were  answered.  The operative sites were marked to include all of these areas with the assistance of the patient.  These were clearly marked and the procedure reviewed at the bedside with the patient and husband.  They had no further questions.  She was taken back to the operative room.  She was intubated on her stretcher and then placed prone and was padded appropriately under general anesthesia.  The areas were reassessed and the hair from the posterior neck was shaved to better expose the neck lipoma.  Once that was done the area was prepped and draped in a sterile fashion and timeout performed.  The right upper back lipoma was injected with local anesthetic.  Incision was made over this which measured about 5 to 6 cm.  I carried the incision over toward the left side since this appeared to be contiguous with the left sided upper back lipoma.  Indeed it was 1 large multiloculated lipoma.  We excised the areas as best as we could but there were multiple loculations that we had to break down given the morphology of this lipoma.  This was all in the subcutaneous space.  Once these areas were cleared, irrigation was used.  Local anesthetic was infiltrated.  I palpated around this area carefully and felt no other areas of palpable disease involving the upper left or upper right back regions.  The neck lipoma was then addressed.  Local anesthetic was demonstrated over the posterior neck which was marked preoperatively.  Incision was  made over this.  A multiloculated lipoma was encountered and this was removed.  Some was disease extended more inferiorly toward the upper back incision.  Given the nature of the disease, opted to put a drain since there was quite a large space that was removed.  This was placed through a separate stab incision to the patient's right upper back.  This was brought out into the lower incision and then tunneled up to the upper incision.  This was secured with 2-0 nylon.  Hemostasis was achieved.   This was all in the subcutaneous space outside of the fascia of the musculature of the upper back.  After ensuring hemostasis, local anesthetic was infiltrated into both wounds.  The deep tissue planes were approximated with 3-0 Vicryl.  4 Monocryl was used to close the skin in a subcuticular fashion.  Dermabond was applied to both.  All final counts were found to be correct.  The patient was then placed back supine in the stretcher extubated taken to recovery in satisfactory condition.

## 2023-11-09 NOTE — Anesthesia Procedure Notes (Signed)
 Procedure Name: Intubation Date/Time: 11/09/2023 9:38 AM  Performed by: Noralyn Beams, CRNAPre-anesthesia Checklist: Patient identified, Emergency Drugs available, Suction available and Patient being monitored Patient Re-evaluated:Patient Re-evaluated prior to induction Oxygen Delivery Method: Circle system utilized Preoxygenation: Pre-oxygenation with 100% oxygen Induction Type: IV induction Ventilation: Mask ventilation without difficulty and Oral airway inserted - appropriate to patient size Laryngoscope Size: Glidescope and 3 Grade View: Grade I Tube type: Oral Tube size: 7.0 mm Number of attempts: 1 Airway Equipment and Method: Stylet, Oral airway and Bite block Placement Confirmation: ETT inserted through vocal cords under direct vision, positive ETCO2 and breath sounds checked- equal and bilateral Secured at: 22 cm Tube secured with: Tape Dental Injury: Teeth and Oropharynx as per pre-operative assessment  Comments: DL x1 with Mac 3 and Miller 2, with grade III view each time; switched to Glidescope with grade I view

## 2023-11-09 NOTE — Transfer of Care (Signed)
 Immediate Anesthesia Transfer of Care Note  Patient: [name]  Procedure(s) Performed: EXCISION, MASS, NECK EXCISION MASS, BACK (Bilateral)  Patient Location: PACU  Anesthesia Type:General  Level of Consciousness: drowsy  Airway & Oxygen Therapy: Patient Spontanous Breathing and Patient connected to face mask oxygen  Post-op Assessment: Report given to RN and Post -op Vital signs reviewed and stable  Post vital signs: Reviewed and stable  Last Vitals:  Vitals Value Taken Time  BP 161/74 11/09/23 1110  Temp    Pulse 106 11/09/23 1112  Resp 25 11/09/23 1112  SpO2 96 % 11/09/23 1112  Vitals shown include unfiled device data.  Last Pain:  Vitals:   11/09/23 0819  TempSrc: Temporal  PainSc: 0-No pain      Patients Stated Pain Goal: 4 (11/09/23 0819)  Complications: No notable events documented.

## 2023-11-10 ENCOUNTER — Encounter (HOSPITAL_BASED_OUTPATIENT_CLINIC_OR_DEPARTMENT_OTHER): Payer: Self-pay | Admitting: Surgery

## 2023-11-10 LAB — SURGICAL PATHOLOGY

## 2023-11-10 NOTE — Anesthesia Postprocedure Evaluation (Signed)
 Anesthesia Post Note  Patient: [name]  Procedure(s) Performed: EXCISION, MASS, NECK EXCISION MASS, BACK (Bilateral)     Patient location during evaluation: PACU Anesthesia Type: General Level of consciousness: awake and alert Pain management: pain level controlled Vital Signs Assessment: post-procedure vital signs reviewed and stable Respiratory status: spontaneous breathing, nonlabored ventilation, respiratory function stable and patient connected to nasal cannula oxygen Cardiovascular status: blood pressure returned to baseline and stable Postop Assessment: no apparent nausea or vomiting Anesthetic complications: no  No notable events documented.  Last Vitals:  Vitals:   11/09/23 1200 11/09/23 1225  BP: (!) 143/69 (!) 144/70  Pulse: 91 95  Resp: 15 16  Temp:  36.6 C  SpO2: 97% 96%    Last Pain:  Vitals:   11/09/23 1245  TempSrc:   PainSc: 3                  [name] L Indigo Barbian

## 2023-11-11 ENCOUNTER — Ambulatory Visit: Payer: PPO | Admitting: Gynecologic Oncology

## 2023-11-24 ENCOUNTER — Encounter (HOSPITAL_BASED_OUTPATIENT_CLINIC_OR_DEPARTMENT_OTHER): Payer: Self-pay | Admitting: Surgery

## 2023-12-02 ENCOUNTER — Encounter: Payer: Self-pay | Admitting: Gynecologic Oncology

## 2023-12-02 ENCOUNTER — Inpatient Hospital Stay: Attending: Gynecologic Oncology | Admitting: Gynecologic Oncology

## 2023-12-02 VITALS — BP 137/56 | HR 65 | Temp 97.8°F | Resp 17 | Ht 65.0 in | Wt 239.4 lb

## 2023-12-02 DIAGNOSIS — C519 Malignant neoplasm of vulva, unspecified: Secondary | ICD-10-CM

## 2023-12-02 DIAGNOSIS — Z8544 Personal history of malignant neoplasm of other female genital organs: Secondary | ICD-10-CM

## 2023-12-02 DIAGNOSIS — D039 Melanoma in situ, unspecified: Secondary | ICD-10-CM

## 2023-12-02 DIAGNOSIS — Z8541 Personal history of malignant neoplasm of cervix uteri: Secondary | ICD-10-CM | POA: Insufficient documentation

## 2023-12-02 DIAGNOSIS — Z9079 Acquired absence of other genital organ(s): Secondary | ICD-10-CM | POA: Diagnosis not present

## 2023-12-02 NOTE — Progress Notes (Signed)
 Gynecologic Oncology Return Clinic Visit  12/02/23  Reason for Visit: surveillance   Treatment History: 08/12/21: Vulvar biopsy revealed vulvar melanoma   09/12/21: Mild hypermetabolism in the lower vulva likely related to recent biopsy/surgery.  No discrete hypermetabolic mass.  No adenopathy.   11/04/2021: Left vulvar wide local excision and left inguinal sentinel lymph node biopsy with Surgical Oncology. Operative Findings: 1 cm melanotic lesion on medial aspect of anterior left labia minora. 1 sentinel lymph node identified in the subcutaneous fat superficial to the external oblique.  Pathology:  Diagnosis  A: Sentinel lymph node, left inguinal, removal - Negative for melanoma in one lymph node (0/1). B: Vulva, excision - Malignant melanoma. Melanoma in situ extends broadly to 11-12-10 o'clock peripheral margin and focally to the 3 o'clock peripheral margin. Margins are negative for invasive melanoma. Type: Vulvar Clark Level: IV Breslow thickness: 1.8 mm Growth phase: Vertical, spindled melanocytes with light pigment Mitoses: per square millimeter Nuclear grade: 2/3 Tumor infiltrating lymphocytes:  Regression: Not identified Ulceration: Not identified  Microscopic satellite: Not identified Vascular invasion: Not identified Perineural invasion: Not identified Associated nevus: Not identified  Margins: Melanoma in situ extends broadly to 11-12-10 o'clock peripheral margin (B3-B5) and focally to the 3 o'clock peripheral margin (B3). Margins are negative for invasive melanoma Pathologic (pT, AJCC 8th edition) staging: pT2a C: Vulva, deep margin, excision - Connective tissue.  - Negative for melanoma   11/19/2021 Tumor Board recommendations.  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 Plan: Repeat excision of vulvar melanoma in situ  - Medical Oncology referral  for consideration of additional treatment (observation vs pembro vs locoregional radiation), scheduled 5/22 Medical oncology recommendations: CT C/A/P q 3 months for 1 year then q 6 months until year 3, then annual. Consider imiquimod  for the in situ component if the margins on reexcision were to show melanoma in situ. Obtain full molecular profile with [employer] testing - testing showed no targetable mutations or obvious germline mutation.   12/24/2021: Partial simple left anterior vulvectomy given margins positive for vulvar melanoma in situ.  Prior scar identified, no definitive evidence visually of melanoma lesion.  mL in situ noted involving all margins.  No residual invasive disease noted.   02/2022: CT chest, abdomen, pelvis shows multiple bilateral pulmonary nodules including previously described 5 mm left lower lobe nodule.  There is suggestion of mild increase in size of 8 mm left upper lobe nodule.  Nonspecific, recommended close follow-up.  Interval postsurgical changes in the left inguinal region.  Too small to characterize 9 mm low-attenuation lesion in the left hepatic lobe, recommended attention on follow-up.   05/17/22: CT C/A/P:  1. Interval increase in size of prominent left external iliac and inguinal lymph nodes including 1 directly adjacent to the left inguinal surgical clips, nonspecific and possibly reactive but suspicious for nodal metastatic disease.  2. Stable nonspecific bilateral pulmonary nodules, continued attention on follow-up imaging suggested. 3. Stable 9 mm hypodensity in the left hepatic lobe, technically too small to accurately characterize but distinctly not typical in appearance for that of a melanoma metastasis favored to reflect a cyst or hemangioma. Suggest continued attention on follow-up imaging. 4. Increased prominence of the left gonadal vein and pelvic collateral vessels, which can be seen in the setting of pelvic venous insufficiency. 5. Hepatic  steatosis. 6. Cholelithiasis without findings of acute cholecystitis. 7.  Aortic Atherosclerosis (ICD10-I70.0).   07/17/22: Vulvar  and peri-clitoral biopsies after Aldara  treatment A. VULVA, LEFT LATERAL, 1:00, BIOPSY: NO EVIDENCE OF MELANOMA B. VULVA, LEFT LATERAL, 2:00, BIOPSY: ATYPICAL LENTIGINOUS MELANOCYTIC PROLIFERATION, SEE COMMENT C. VULVA, LEFT LATERAL, 3:00, BIOPSY: ATYPICAL LENTIGINOUS MELANOCYTIC PROLIFERATION, SEE COMMENT D. VULVA, LEFT, SUPERIOR CLITORAL FOLD, BIOPSY: SCATTERED JUNCTIONAL MELANOCYTES, NOT DIAGNOSTIC OF MALIGNANCY, SEE COMMENT E. VULVA, LEFT LATERAL, CLITORIS, BIOPSY: NO EVIDENCE OF MELANOMA F. VULVA, LEFT MEDIAL, 3:00, BIOPSY: NO EVIDENCE OF MELANOMA G. VULVA, LEFT CENTRAL, BIOPSY: NO EVIDENCE OF MELANOMA COMMENT: Numerous levels of each biopsy were evaluated by H and E as well as MelanA stain. Negative biopsies are noted above. In biopsies at 2 and 3 o'clock, there is an increase in junctional melanocytes, but without upward growth nor nest formation. This is an atypical lentiginous melanocytic proliferation. It may represent partially-treated previous melanoma but is not diagnostic for such. The superior clitoral fold exhibits scattered junctional melanocytes, which are evenly spaced and not diagnostic of malignancy. Followup and/or additional biopsies are recommended. Drs. Custer Downs and Austine Blunt have a similar opinion.    08/14/22: CT C/A/P: 1. No convincing evidence of new or progressive metastatic disease within the chest, abdomen or pelvis. 2. Low-density filling defect in the left upper lobe lateral segmental bronchus with corresponding atelectasis and new prominent left hilar nodal tissue, nonspecific findings which are favored to reflect aspiration/mucous in the airway with reactive hilar nodal tissue. However while felt less likely a metastatic endobronchial lesion is not entirely excluded and warrants further evaluation with short-term interval follow-up chest CT  versus bronchoscopy. 3. Stable bilateral pulmonary nodules, favored benign. Continued attention on follow-up imaging suggested. 4. Cholelithiasis without findings of acute cholecystitis. 5.  Aortic Atherosclerosis (ICD10-I70.0).   10/23/22: Multiple vulvar biopsies A. CLITORIS, LAT, BIOPSY:  -  Skin with mild dermal fibrosis, otherwise no significant pathology.  -  No morphologic evidence of melanocytic proliferation (clinical  history of melanoma noted).  B. VULVA, LAT, 2:00, BIOPSY:  -  Skin with mild dermal fibrosis, otherwise no significant pathology  -  No morphologic evidence of melanocytic proliferation (clinical  history of melanoma noted).    11/2022: CT C/A/P -stable bilateral pulmonary nodules, no evidence of new or progressive disease.   01/29/2023: Vulvar biopsy at 1:00 shows reactive squamous mucosa with spongiosis, hyperparakeratosis and mild chronic inflammation.  No dysplasia or melanocytic atypia.   02/2023: CT C/A/P -stable bilateral pulmonary nodules, no evidence of new or progressive disease.   08/24/23: CT C/A/P -stable bilateral pulmonary nodules, no evidence of new or progressive disease.  Cholelithiasis without acute cholecystitis.  Colonic diverticulosis noted.  Interval History: Doing well.  Recently underwent excision of lipomas on her back.  Denies any vulvar symptoms including bleeding, pain, pruritus, or discharge.  Endorses baseline bowel and bladder function.  Past Medical/Surgical History: Past Medical History:  Diagnosis Date   Asthma    has rescue inhaler, now on Flovent    Chronic back pain    Diabetes mellitus without complication (HCC)    on medformin, on Ozempic    GERD (gastroesophageal reflux disease)    History of COVID-19 04/05/2021   Hyperlipemia    Hypertension    Hypothyroidism    Insomnia    melanoma 11/2021   vulva   OAB (overactive bladder)    Seasonal allergies    Sleep apnea    uses a cpap    Past Surgical History:   Procedure Laterality Date   ANKLE ARTHROTOMY  2003   left-fx   APPENDECTOMY     back  fusion  02/14/2019   L4 back fusion   BACK SURGERY  2010   lumb disk   BREAST LUMPECTOMY WITH RADIOACTIVE SEED LOCALIZATION Left 06/13/2019   Procedure: LEFT BREAST LUMPECTOMY WITH RADIOACTIVE SEED LOCALIZATION;  Surgeon: Sim Dryer, MD;  Location: [facility];  Service: General;  Laterality: Left;   CARPAL TUNNEL RELEASE     rt   CARPAL TUNNEL RELEASE Left 08/01/2013   Procedure: LEFT CARPAL TUNNEL RELEASE;  Surgeon: Kemp Patter, MD;  Location: [facility];  Service: Orthopedics;  Laterality: Left;   CESAREAN SECTION  83,86   COLONOSCOPY     DILATATION & CURETTAGE/HYSTEROSCOPY WITH MYOSURE N/A 08/23/2018   Procedure: DILATATION & CURETTAGE/HYSTEROSCOPY WITH MYOSURE POLYPECTOMY;  Surgeon: Greta Leatherwood, MD;  Location: [facility];  Service: Gynecology;  Laterality: N/A;  fibroid resection   DILATION AND CURETTAGE OF UTERUS     x2   EXCISION MASS NECK N/A 11/09/2023   Procedure: EXCISION, MASS, NECK;  Surgeon: Sim Dryer, MD;  Location: [facility];  Service: General;  Laterality: N/A;   EXCISION MASS, BACK Bilateral 11/09/2023   Procedure: EXCISION MASS, BACK;  Surgeon: Sim Dryer, MD;  Location: [facility];  Service: General;  Laterality: Bilateral;  EXCISION LIPOMA POSTERIOR RIGHT AND LEFT OF BACK   MASS EXCISION N/A 08/04/2017   Procedure: EXCISION LIPOMA UPPER BACK x2 AND NECK;  Surgeon: Sim Dryer, MD;  Location:  SURGERY CENTER;  Service: General;  Laterality: N/A;   NASAL SEPTOPLASTY W/ TURBINOPLASTY Bilateral 06/21/2015   Procedure: NASAL SEPTOPLASTY WITH BILATERAL TURBINATE REDUCTION;  Surgeon: Ammon Bales, MD;  Location: [facility];  Service: ENT;  Laterality: Bilateral;   SINOSCOPY     TONSILLECTOMY     TUBAL LIGATION     VULVECTOMY N/A 12/24/2021   Procedure: WIDE EXCISION VULVECTOMY;   Surgeon: Suzi Essex, MD;  Location: [facility];  Service: Gynecology;  Laterality: N/A;    Family History  Problem Relation Age of Onset   Allergic rhinitis Father    Allergic rhinitis Sister    Asthma Sister    Endometrial cancer Sister 82   Multiple myeloma Sister    Cancer Maternal Grandmother    Asthma Paternal Aunt    Angioedema Neg Hx    Eczema Neg Hx    Immunodeficiency Neg Hx    Urticaria Neg Hx    Ovarian cancer Neg Hx    Breast cancer Neg Hx    Colon cancer Neg Hx    Prostate cancer Neg Hx    Pancreatic cancer Neg Hx     Social History   Socioeconomic History   Marital status: Married    Spouse name: Not on file   Number of children: Not on file   Years of education: Not on file   Highest education level: Not on file  Occupational History   Occupation: retored  Tobacco Use   Smoking status: Never   Smokeless tobacco: Never  Vaping Use   Vaping status: Never Used  Substance and Sexual Activity   Alcohol use: Yes    Comment: 2 glasses of wine/month   Drug use: No   Sexual activity: Yes    Birth control/protection: Post-menopausal  Other Topics Concern   Not on file  Social History Narrative   Not on file   Social Drivers of Health   Financial Resource Strain: Not on file  Food Insecurity: No Food Insecurity (08/05/2022)  Hunger Vital Sign    Worried About Running Out of Food in the Last Year: Never true    Ran Out of Food in the Last Year: Never true  Transportation Needs: No Transportation Needs (08/05/2022)   PRAPARE - Administrator, Civil Service (Medical): No    Lack of Transportation (Non-Medical): No  Physical Activity: Not on file  Stress: Not on file  Social Connections: Not on file    Current Medications:  Current Outpatient Medications:    5-Hydroxytryptophan (5-HTP PO), Take 1 tablet by mouth daily., Disp: , Rfl:    albuterol  (VENTOLIN  HFA) 108 (90 Base) MCG/ACT inhaler, Inhale 2 puffs into  the lungs every 4 (four) hours as needed for wheezing or shortness of breath., Disp: , Rfl:    amLODipine (NORVASC) 5 MG tablet, Take 5 mg by mouth daily., Disp: , Rfl:    Azelastine  HCl 137 MCG/SPRAY SOLN, USE 2 SPRAYS IN EACH NOSTRIL 2 TIMES A DAY AS DIRECTED, Disp: 30 mL, Rfl: 5   baclofen (LIORESAL) 20 MG tablet, Take 20 mg by mouth See admin instructions. Take 20 mg daily, may take up to 3 times daily as needed for spasms. Pt takes once a day, Disp: , Rfl:    CINNAMON PO, Take 1,000 mg by mouth in the morning and at bedtime., Disp: , Rfl:    diclofenac Sodium (VOLTAREN) 1 % GEL, Apply 2 g topically 4 (four) times daily as needed (pain)., Disp: , Rfl:    famotidine-calcium  carbonate-magnesium hydroxide (PEPCID COMPLETE) 10-800-165 MG chewable tablet, Chew 1 tablet by mouth every evening., Disp: , Rfl:    fluticasone  (FLONASE ) 50 MCG/ACT nasal spray, Place 1 spray into both nostrils daily., Disp: , Rfl:    fluticasone  (FLOVENT  HFA) 44 MCG/ACT inhaler, INHALE 2 PUFFS INTO THE LUNGS IN THE MORNING AND AT BEDTIME. INHALE 1 PUFF BY MOUTH EVERY DAY, Disp: 10.6 each, Rfl: 5   gabapentin  (NEURONTIN ) 400 MG capsule, Take 400-800 mg by mouth See admin instructions. Take 400 mg in the morning and 800 mg at night, Disp: , Rfl:    GEMTESA 75 MG TABS, 1 tablet Orally Once a day for 30 day(s), Disp: , Rfl:    glucose blood test strip, as directed finger stick once a day for 90 days, Disp: , Rfl:    hydrochlorothiazide (MICROZIDE) 12.5 MG capsule, Take 12.5 mg by mouth daily., Disp: , Rfl:    ibuprofen  (ADVIL ) 800 MG tablet, Take 1 tablet (800 mg total) by mouth every 8 (eight) hours as needed., Disp: 30 tablet, Rfl: 0   levocetirizine (XYZAL ) 5 MG tablet, TAKE 1 TABLET BY MOUTH EVERY DAY IN THE EVENING, Disp: 90 tablet, Rfl: 1   levothyroxine (SYNTHROID) 112 MCG tablet, Take 112 mcg by mouth daily before breakfast., Disp: , Rfl:    losartan (COZAAR) 100 MG tablet, Take 100 mg by mouth daily., Disp: , Rfl:     Melatonin 3 MG TABS, Take 3 mg by mouth at bedtime., Disp: , Rfl:    meloxicam (MOBIC) 7.5 MG tablet, Take 7.5 mg by mouth 2 (two) times daily as needed for pain., Disp: , Rfl:    Mometasone Furoate  (ASMANEX  HFA) 50 MCG/ACT AERO, 2 puffs twice a day., Disp: 13 g, Rfl: 5   montelukast  (SINGULAIR ) 10 MG tablet, TAKE 1 TABLET BY MOUTH EVERYDAY AT BEDTIME, Disp: 30 tablet, Rfl: 5   Multiple Vitamin (MULTIVITAMIN WITH MINERALS) TABS tablet, Take 1 tablet by mouth at bedtime., Disp: , Rfl:  oseltamivir (TAMIFLU) 75 MG capsule, 1 capsule Orally once a day for 10 days, Disp: , Rfl:    Semaglutide , 2 MG/DOSE, (OZEMPIC , 2 MG/DOSE,) 8 MG/3ML SOPN, Inject 2 mg into the skin once a week., Disp: 9 mL, Rfl: 3   sertraline (ZOLOFT) 50 MG tablet, Take 50 mg by mouth daily., Disp: , Rfl:    simvastatin (ZOCOR) 20 MG tablet, Take 20 mg by mouth at bedtime. , Disp: , Rfl:    tirzepatide (MOUNJARO) 5 MG/0.5ML Pen, Inject 5 mg into the skin once a week., Disp: , Rfl:    traMADol  (ULTRAM ) 50 MG tablet, Take 1 tablet (50 mg total) by mouth every 6 (six) hours as needed for moderate pain (pain score 4-6) or severe pain (pain score 7-10)., Disp: 10 tablet, Rfl: 0   Turmeric 500 MG CAPS, 1 capsule Orally once a day, Disp: , Rfl:    zolpidem (AMBIEN) 10 MG tablet, Take 5 mg by mouth at bedtime as needed for sleep. , Disp: , Rfl:   Review of Systems: Denies appetite changes, fevers, chills, fatigue, unexplained weight changes. Denies hearing loss, neck lumps or masses, mouth sores, ringing in ears or voice changes. Denies cough or wheezing.  Denies shortness of breath. Denies chest pain or palpitations. Denies leg swelling. Denies abdominal distention, pain, blood in stools, constipation, diarrhea, nausea, vomiting, or early satiety. Denies pain with intercourse, dysuria, frequency, hematuria or incontinence. Denies hot flashes, pelvic pain, vaginal bleeding or vaginal discharge.   Denies joint pain, back pain or muscle  pain/cramps. Denies itching, rash, or wounds. Denies dizziness, headaches, numbness or seizures. Denies swollen lymph nodes or glands, denies easy bruising or bleeding. Denies anxiety, depression, confusion, or decreased concentration.  Physical Exam: BP (!) 137/56 (BP Location: Left Arm, Patient Position: Sitting)   Pulse 65   Temp 97.8 F (36.6 C) (Oral)   Resp 17   Ht 5\' 5"  (1.651 m)   Wt 239 lb 6.4 oz (108.6 kg)   LMP 07/06/1998 (Approximate)   SpO2 95%   BMI 39.84 kg/m  General: Alert, oriented, no acute distress. HEENT: Normocephalic, atraumatic, sclera anicteric. Chest: Clear to auscultation bilaterally.  No wheezes or rhonchi. Cardiovascular: Regular rate and rhythm, no murmurs. Abdomen: Obese, soft, nontender.  Normoactive bowel sounds.  No masses or hepatosplenomegaly appreciated.   Extremities: Grossly normal range of motion.  Warm, well perfused.  No edema bilaterally. Skin: No rashes or lesions noted. Lymphatics: No palpable cervical, supraclavicular, or inguinal adenopathy. GU: Normal-appearing external female genitalia with some skin changes related to prior excisions and biopsies.  No atypical vascularity or hyperpigmented lesions.  No erythema noted on exam today.  On speculum exam, vaginal mucosa is mildly atrophic, no visible lesions.  Laboratory & Radiologic Studies: None new  Assessment & Plan: [name] is a 73 y.o. woman with a history of stage IB vulvar melanoma who underwent repeat excision in an attempt to achieve negative margins given MIS present. Unfortunately, margins still positive for melanoma in situ (11/2021). Patient was treated with Aldara  with post-treatment biopsies (07/2022) showing no definite melanoma in situ or melanoma.  CT in 08/2023 showed stable pulmonary nodules, no evidence of metastatic disease.   She is overall doing well without evidence of disease on exam today.     We have transitioned to CT scans every 6 months.  Her next  will be in August.     I will see her back for surveillance follow-up in 3 months.  I reviewed  signs and symptoms that should prompt a phone call before her next scheduled visit.  20 minutes of total time was spent for this patient encounter, including preparation, face-to-face counseling with the patient and coordination of care, and documentation of the encounter.  Wiley Hanger, MD  Division of Gynecologic Oncology  Department of Obstetrics and Gynecology  [facility]

## 2023-12-02 NOTE — Patient Instructions (Signed)
 It was good to see you today.  I do not see or feel any evidence of cancer recurrence on your exam.  I will see you for follow-up in 3 months.  As always, if you develop any new and concerning symptoms before your next visit, please call to see me sooner.

## 2023-12-10 DIAGNOSIS — I10 Essential (primary) hypertension: Secondary | ICD-10-CM | POA: Diagnosis not present

## 2023-12-10 DIAGNOSIS — E1142 Type 2 diabetes mellitus with diabetic polyneuropathy: Secondary | ICD-10-CM | POA: Diagnosis not present

## 2023-12-10 DIAGNOSIS — M1710 Unilateral primary osteoarthritis, unspecified knee: Secondary | ICD-10-CM | POA: Diagnosis not present

## 2023-12-10 DIAGNOSIS — E782 Mixed hyperlipidemia: Secondary | ICD-10-CM | POA: Diagnosis not present

## 2023-12-10 DIAGNOSIS — F3341 Major depressive disorder, recurrent, in partial remission: Secondary | ICD-10-CM | POA: Diagnosis not present

## 2023-12-13 DIAGNOSIS — Z1231 Encounter for screening mammogram for malignant neoplasm of breast: Secondary | ICD-10-CM | POA: Diagnosis not present

## 2023-12-21 DIAGNOSIS — I10 Essential (primary) hypertension: Secondary | ICD-10-CM | POA: Diagnosis not present

## 2023-12-21 DIAGNOSIS — E118 Type 2 diabetes mellitus with unspecified complications: Secondary | ICD-10-CM | POA: Diagnosis not present

## 2023-12-21 DIAGNOSIS — E1142 Type 2 diabetes mellitus with diabetic polyneuropathy: Secondary | ICD-10-CM | POA: Diagnosis not present

## 2023-12-22 DIAGNOSIS — G4733 Obstructive sleep apnea (adult) (pediatric): Secondary | ICD-10-CM | POA: Diagnosis not present

## 2023-12-23 DIAGNOSIS — M1712 Unilateral primary osteoarthritis, left knee: Secondary | ICD-10-CM | POA: Diagnosis not present

## 2023-12-23 DIAGNOSIS — M170 Bilateral primary osteoarthritis of knee: Secondary | ICD-10-CM | POA: Diagnosis not present

## 2023-12-23 DIAGNOSIS — M1711 Unilateral primary osteoarthritis, right knee: Secondary | ICD-10-CM | POA: Diagnosis not present

## 2023-12-28 DIAGNOSIS — M5412 Radiculopathy, cervical region: Secondary | ICD-10-CM | POA: Diagnosis not present

## 2023-12-28 DIAGNOSIS — Z1330 Encounter for screening examination for mental health and behavioral disorders, unspecified: Secondary | ICD-10-CM | POA: Diagnosis not present

## 2023-12-28 DIAGNOSIS — M5416 Radiculopathy, lumbar region: Secondary | ICD-10-CM | POA: Diagnosis not present

## 2023-12-28 DIAGNOSIS — M48062 Spinal stenosis, lumbar region with neurogenic claudication: Secondary | ICD-10-CM | POA: Diagnosis not present

## 2023-12-28 DIAGNOSIS — M4802 Spinal stenosis, cervical region: Secondary | ICD-10-CM | POA: Diagnosis not present

## 2023-12-28 NOTE — Progress Notes (Signed)
 HPI: I had the pleasure of seeing [name] today for follow-up in the orthopedic spine clinic.  She has a history of L5-S1 TLIF on 02/14/19 with Dr. Gust and history of left L5-S1 discectomy surgery in 03/01/09 by Dr. Gaither. She did well after surgery but she still has persistent low back pain.  She really does not get much pain down the legs.  She also has a history of neck pain as well as right upper extremity radiculopathy.  She responded well to a right C7 SNRB with Dr. Darlean in June 2024.  She has a history of mucosal melanoma, but states that her scans have all been pretty stable recently.  She states today she feels that her neck and back pain are pretty stable.  She presents for med check today.  Review of Systems  Constitutional:  Negative for fever and weight loss.  HENT:  Negative for congestion, hearing loss and sore throat.   Eyes:  Negative for blurred vision.  Respiratory:  Negative for cough and shortness of breath.   Cardiovascular:  Negative for chest pain and palpitations.  Gastrointestinal:  Negative for abdominal pain, nausea and vomiting.  Genitourinary:  Negative for dysuria.  Skin:  Negative for rash.  Neurological:  Negative for dizziness, tremors, speech change and loss of consciousness.  Endo/Heme/Allergies:  Does not bruise/bleed easily.  Psychiatric/Behavioral:  Negative for hallucinations and memory loss.    PMH: H/o T2DM, managed w/ Metformin.   Meds: She takes Mobic PRN daily-discussed having lab work done regularly with her PCP to make sure her renal function does not decline, Tylenol  prn, and baclofen QHS prn, gabapentin  400mg  4x/day for her ongoing pain control.  Refilled gabapentin  today.  No refills for Mobic and baclofen needed today.  Imaging: Lumbar XR 01/2021 showing L5-S1 PSF with hardware intact and well positioned, no loosening or complications. Severe multilevel spondylosis. Potential autofusion of L2-3. Severe facet arthropathy R>L.   MRI cervical on  canopy 10/2022:Mildly progressive multilevel spondylosis throughout the cervical spine, superimposed on a chronic convex left scoliosis. There is resulting chronic cord flattening at C3-4, C4-5 and C5-6 without abnormal cord signal. Severe foraminal narrowing is present at C6-7 R>L.  MRI lumbar on canopy 10/2022: Stable moderate multifactorial spinal stenosis at L2-3 with asymmetric lateral recess and foraminal narrowing on the right. Mildly progressive spondylosis at L3-4 with mildly progressive asymmetric moderate right lateral recess and right foraminal narrowing. Increased right foraminal narrowing at L4-5 with possible resulting extraforaminal right L4 nerve root encroachment.  Injections: R C7 SNRB with Dr. Darlean 12/15/22 with 65 percent relief which is still sustained.  Physical exam: Patient is alert and oriented x3. Answers questions appropriately. Does not appear ill or toxic. BUE 5/5 strength throughout. BLE pulses are palpable, skin is warm and dry, no erythema, or edema is noted. BLE strength is 5/5 in bilateral EHL, DF, PF, quads, hamstrings, HF, HE, abductors, and adductors. Sensation is symmetric bilaterally. Negative straight leg raise. Negative Belvie Bellman. Patient walks with a narrow-based gait, no instability on exam. No pain secondary to flexion/extension. Patellar and achilles reflexes 2+ and symmetric bilaterally.   Recommendations: At this point we discussed in the room.  Diagnosis and typical treatment algorithm reviewed with the patient. At this point she has significant degenerative changes and multilevel stenosis in her neck and low back. Overall she is doing better. She did get relief from the R C7 SNRB. We discussed repeating that in the future if the pain worsens again. Pt exercises  aggravate her pain, so she has stopped those. She is hopeful to avoid surgery at this point. She has some signs of carpal tunnel on exam today as well so we could potentially have her get an  injection there as well. She had previous R CT surgery 12 years ago so we discussed that the symptoms could return over time.  She wants to hold off on that for now. Continue gabapentin , meloxicam, and baclofen.  She does understand she has signs of hyperreflexia on exam and at some point she may require a cervical fusion. She understands if she develops worsening balance issues, fine motor skill/handwriting issues or weakness she needs to let us  know. Questions were encouraged and answered today. Instructed to call with any new questions or concerns.  Greater than 30 minutes was spent today reviewing patient chart, reviewing history, physical exam, education and counseling, medication management and documentation. Follow-up in 6 months with Kristyn for [employer]. She will call if she wants to repeat R C7 SNRB prior to that.

## 2024-01-03 DIAGNOSIS — I10 Essential (primary) hypertension: Secondary | ICD-10-CM | POA: Diagnosis not present

## 2024-01-03 DIAGNOSIS — F3341 Major depressive disorder, recurrent, in partial remission: Secondary | ICD-10-CM | POA: Diagnosis not present

## 2024-01-03 DIAGNOSIS — E039 Hypothyroidism, unspecified: Secondary | ICD-10-CM | POA: Diagnosis not present

## 2024-01-03 DIAGNOSIS — E1142 Type 2 diabetes mellitus with diabetic polyneuropathy: Secondary | ICD-10-CM | POA: Diagnosis not present

## 2024-01-03 DIAGNOSIS — E669 Obesity, unspecified: Secondary | ICD-10-CM | POA: Diagnosis not present

## 2024-01-03 DIAGNOSIS — J4520 Mild intermittent asthma, uncomplicated: Secondary | ICD-10-CM | POA: Diagnosis not present

## 2024-01-03 DIAGNOSIS — E118 Type 2 diabetes mellitus with unspecified complications: Secondary | ICD-10-CM | POA: Diagnosis not present

## 2024-01-17 DIAGNOSIS — R399 Unspecified symptoms and signs involving the genitourinary system: Secondary | ICD-10-CM | POA: Diagnosis not present

## 2024-01-17 DIAGNOSIS — N390 Urinary tract infection, site not specified: Secondary | ICD-10-CM | POA: Diagnosis not present

## 2024-01-19 DIAGNOSIS — E118 Type 2 diabetes mellitus with unspecified complications: Secondary | ICD-10-CM | POA: Diagnosis not present

## 2024-01-19 DIAGNOSIS — I10 Essential (primary) hypertension: Secondary | ICD-10-CM | POA: Diagnosis not present

## 2024-01-19 DIAGNOSIS — E1142 Type 2 diabetes mellitus with diabetic polyneuropathy: Secondary | ICD-10-CM | POA: Diagnosis not present

## 2024-01-20 ENCOUNTER — Other Ambulatory Visit: Payer: Self-pay

## 2024-01-20 ENCOUNTER — Ambulatory Visit: Payer: PPO | Admitting: Allergy

## 2024-01-20 ENCOUNTER — Encounter: Payer: Self-pay | Admitting: Allergy

## 2024-01-20 VITALS — BP 120/72 | HR 61 | Temp 97.3°F | Resp 18 | Ht 64.96 in | Wt 241.6 lb

## 2024-01-20 DIAGNOSIS — J310 Chronic rhinitis: Secondary | ICD-10-CM

## 2024-01-20 DIAGNOSIS — J4530 Mild persistent asthma, uncomplicated: Secondary | ICD-10-CM | POA: Diagnosis not present

## 2024-01-20 MED ORDER — LEVOCETIRIZINE DIHYDROCHLORIDE 5 MG PO TABS: 2.5000 mg | ORAL_TABLET | Freq: Every evening | ORAL | 1 refills | Status: DC

## 2024-01-20 NOTE — Addendum Note (Signed)
 Addended by: JENEL MARYLYNN GRADE on: 01/20/2024 02:48 PM   Modules accepted: Orders

## 2024-01-20 NOTE — Patient Instructions (Addendum)
 Asthma  - continue Flovent  44 g 1 puff once a day at this time.  Once you are out of Flovent  change to the Asmanex  1 puff once a day.  - asthma action plan if having asthma flare or respiratory illness: take Flovent  or Asmanex  2 puffs twice a day   - Continue Singulair  10 mg daily  - have access to albuterol  inhaler 2 puffs every 4-6 hours as needed for cough/wheeze/shortness of breath/chest tightness.  May use 15-20 minutes prior to activity.   Monitor frequency of use.    Asthma control goals:  Full participation in all desired activities (may need albuterol  before activity) Albuterol  use two time or less a week on average (not counting use with activity) Cough interfering with sleep two time or less a month Oral steroids no more than once a year No hospitalizations    Mixed rhinitis  - Continue Singulair  as above  - Continue Xyzal  1/2 tab daily to every other day.    - Continue Flonase  1-2 sprays each nostril daily as needed for congestion.  Use for 1-2 weeks at a time before stopping once symptoms improved.   - Continue Astelin  1-2 sprays 1-2 times a day as needed for nasal drainage/post-nasal drip    Follow-up in 6 months or sooner if needed

## 2024-01-20 NOTE — Progress Notes (Signed)
 Follow-up Note  RE: [name] MRN: [id number] DOB: Dec 05, 1950 Date of Office Visit: 01/20/2024   History of present illness: [name] is a 73 y.o. female presenting today for follow-up of asthma, mixed rhinitis.  She was last seen in the office on 08/05/2023 by myself. Discussed the use of AI scribe software for clinical note transcription with the patient, who gave verbal consent to proceed.  She has been managing her asthma with a daily inhaler, Flovent  currently as she has not run out yet, and has not experienced any major respiratory illnesses this winter, which is a first in three years. She occasionally experiences wheezing on hot days, but it is not severe and resolves on its own. She is almost out of Flovent , which her insurance no longer covers.  She has been able to fill Asmanex  which will replace Flovent  when she runs out.  Her allergy symptoms have been manageable this spring and summer, partly due to less exposure to allergens because of knee trouble limiting her outdoor activities. She uses Flonase  daily in the morning and Astelin  at night to manage nasal congestion and nasal drainage. She also takes half tab Xyzal  nightly for allergies.  The combination of all of these things she feels has been the reason why she has not had significant increase in allergy symptoms this spring and summer.  She has been on Mounjaro for diabetes management for approximately four months, titrating up to a dose of 12.5 mg. She was previously on Ozempic . She has not noticed significant changes yet but is hopeful for improvement.  She mentions a history of mucosal melanoma and is scheduled for a CT scan in about a month for ongoing surveillance.  She is currently on antibiotics for a suspected urinary tract infection.  Review of systems: 10pt ROS negative unless noted above in HPI  Past medical/social/surgical/family history have been reviewed and are unchanged unless specifically  indicated below.  No changes  Medication List: Current Outpatient Medications  Medication Sig Dispense Refill   5-Hydroxytryptophan (5-HTP PO) Take 1 tablet by mouth daily.     albuterol  (VENTOLIN  HFA) 108 (90 Base) MCG/ACT inhaler Inhale 2 puffs into the lungs every 4 (four) hours as needed for wheezing or shortness of breath.     amLODipine (NORVASC) 5 MG tablet Take 5 mg by mouth daily.     Azelastine  HCl 137 MCG/SPRAY SOLN USE 2 SPRAYS IN EACH NOSTRIL 2 TIMES A DAY AS DIRECTED 30 mL 5   baclofen (LIORESAL) 20 MG tablet Take 20 mg by mouth See admin instructions. Take 20 mg daily, may take up to 3 times daily as needed for spasms. Pt takes once a day     CINNAMON PO Take 1,000 mg by mouth in the morning and at bedtime.     diclofenac Sodium (VOLTAREN) 1 % GEL Apply 2 g topically 4 (four) times daily as needed (pain).     famotidine-calcium  carbonate-magnesium hydroxide (PEPCID COMPLETE) 10-800-165 MG chewable tablet Chew 1 tablet by mouth every evening.     fluticasone  (FLONASE ) 50 MCG/ACT nasal spray Place 1 spray into both nostrils daily.     fluticasone  (FLOVENT  HFA) 44 MCG/ACT inhaler INHALE 2 PUFFS INTO THE LUNGS IN THE MORNING AND AT BEDTIME. INHALE 1 PUFF BY MOUTH EVERY DAY 10.6 each 5   gabapentin  (NEURONTIN ) 400 MG capsule Take 400-800 mg by mouth See admin instructions. Take 400 mg in the morning and 800 mg at night  GEMTESA 75 MG TABS 1 tablet Orally Once a day for 30 day(s)     glucose blood test strip as directed finger stick once a day for 90 days     levothyroxine (SYNTHROID) 112 MCG tablet Take 112 mcg by mouth daily before breakfast.     losartan (COZAAR) 100 MG tablet Take 100 mg by mouth daily.     Melatonin 3 MG TABS Take 3 mg by mouth at bedtime.     meloxicam (MOBIC) 7.5 MG tablet Take 7.5 mg by mouth 2 (two) times daily as needed for pain.     Mometasone Furoate  (ASMANEX  HFA) 50 MCG/ACT AERO 2 puffs twice a day. 13 g 5   montelukast  (SINGULAIR ) 10 MG tablet TAKE  1 TABLET BY MOUTH EVERYDAY AT BEDTIME 30 tablet 5   MOUNJARO 12.5 MG/0.5ML Pen inject 0.5ML Subcutaneous once a week; Duration: 28 days     Multiple Vitamin (MULTIVITAMIN WITH MINERALS) TABS tablet Take 1 tablet by mouth at bedtime.     oseltamivir (TAMIFLU) 75 MG capsule 1 capsule Orally once a day for 10 days     simvastatin (ZOCOR) 20 MG tablet Take 20 mg by mouth at bedtime.      Turmeric 500 MG CAPS 1 capsule Orally once a day     zolpidem (AMBIEN) 10 MG tablet Take 5 mg by mouth at bedtime as needed for sleep.      hydrochlorothiazide (MICROZIDE) 12.5 MG capsule Take 12.5 mg by mouth daily.     ibuprofen  (ADVIL ) 800 MG tablet Take 1 tablet (800 mg total) by mouth every 8 (eight) hours as needed. 30 tablet 0   levocetirizine (XYZAL ) 5 MG tablet Take 0.5 tablets (2.5 mg total) by mouth every evening. 90 tablet 1   No current facility-administered medications for this visit.     Known medication allergies: Allergies  Allergen Reactions   Oxycodone -Acetaminophen  Nausea Only     Physical examination: Blood pressure 120/72, pulse 61, temperature (!) 97.3 F (36.3 C), temperature source Temporal, resp. rate 18, height 5' 4.96 (1.65 m), weight 241 lb 9.6 oz (109.6 kg), last menstrual period 07/06/1998, SpO2 95%.  General: Alert, interactive, in no acute distress. HEENT: PERRLA, TMs pearly gray, turbinates non-edematous without discharge, post-pharynx non erythematous. Neck: Supple without lymphadenopathy. Lungs: Clear to auscultation without wheezing, rhonchi or rales. {no increased work of breathing. CV: Normal S1, S2 without murmurs. Abdomen: Nondistended, nontender. Skin: Warm and dry, without lesions or rashes. Extremities:  No clubbing, cyanosis or edema. Neuro:   Grossly intact.  Diagnostics/Labs:  Spirometry: FEV1: 2.24 L 100%, FVC: 2.61 L 90%, ratio consistent with nonobstructive pattern  Assessment and plan: Asthma  - continue Flovent  44 g 1 puff once a day at this  time.  Once you are out of Flovent  change to the Asmanex  1 puff once a day.  - asthma action plan if having asthma flare or respiratory illness: take Flovent  or Asmanex  2 puffs twice a day   - Continue Singulair  10 mg daily  - have access to albuterol  inhaler 2 puffs every 4-6 hours as needed for cough/wheeze/shortness of breath/chest tightness.  May use 15-20 minutes prior to activity.   Monitor frequency of use.    Asthma control goals:  Full participation in all desired activities (may need albuterol  before activity) Albuterol  use two time or less a week on average (not counting use with activity) Cough interfering with sleep two time or less a month Oral steroids no more than once a  year No hospitalizations    Mixed rhinitis  - Continue Singulair  as above  - Continue Xyzal  1/2 tab daily to every other day or daily if needed.    - Continue Flonase  1-2 sprays each nostril daily as needed for congestion.  Use for 1-2 weeks at a time before stopping once symptoms improved.   - Continue Astelin  1-2 sprays 1-2 times a day as needed for nasal drainage/post-nasal drip    Follow-up in 6 months or sooner if needed  I appreciate the opportunity to take part in Jorgia's care. Please do not hesitate to contact me with questions.  Sincerely,   Danita Brain, MD Allergy/Immunology [facility]

## 2024-02-03 DIAGNOSIS — I10 Essential (primary) hypertension: Secondary | ICD-10-CM | POA: Diagnosis not present

## 2024-02-03 DIAGNOSIS — E118 Type 2 diabetes mellitus with unspecified complications: Secondary | ICD-10-CM | POA: Diagnosis not present

## 2024-02-03 DIAGNOSIS — E039 Hypothyroidism, unspecified: Secondary | ICD-10-CM | POA: Diagnosis not present

## 2024-02-03 DIAGNOSIS — F3341 Major depressive disorder, recurrent, in partial remission: Secondary | ICD-10-CM | POA: Diagnosis not present

## 2024-02-03 DIAGNOSIS — E669 Obesity, unspecified: Secondary | ICD-10-CM | POA: Diagnosis not present

## 2024-02-03 DIAGNOSIS — E1142 Type 2 diabetes mellitus with diabetic polyneuropathy: Secondary | ICD-10-CM | POA: Diagnosis not present

## 2024-02-03 DIAGNOSIS — J4520 Mild intermittent asthma, uncomplicated: Secondary | ICD-10-CM | POA: Diagnosis not present

## 2024-02-08 ENCOUNTER — Ambulatory Visit (HOSPITAL_COMMUNITY)
Admission: RE | Admit: 2024-02-08 | Discharge: 2024-02-08 | Disposition: A | Discharge status: Home / Self Care | Payer: PPO | Source: Ambulatory Visit | Attending: Gynecologic Oncology | Admitting: Gynecologic Oncology

## 2024-02-08 DIAGNOSIS — C519 Malignant neoplasm of vulva, unspecified: Secondary | ICD-10-CM | POA: Insufficient documentation

## 2024-02-08 DIAGNOSIS — R918 Other nonspecific abnormal finding of lung field: Secondary | ICD-10-CM | POA: Diagnosis not present

## 2024-02-08 DIAGNOSIS — D259 Leiomyoma of uterus, unspecified: Secondary | ICD-10-CM | POA: Diagnosis not present

## 2024-02-08 DIAGNOSIS — K8020 Calculus of gallbladder without cholecystitis without obstruction: Secondary | ICD-10-CM | POA: Diagnosis not present

## 2024-02-08 MED ORDER — IOHEXOL 300 MG/ML  SOLN
100.0000 mL | Freq: Once | INTRAMUSCULAR | Status: AC | PRN
  Administered 2024-02-08: 100 mL via INTRAVENOUS

## 2024-02-08 MED ORDER — IOHEXOL 9 MG/ML PO SOLN
1000.0000 mL | ORAL | Status: AC
  Administered 2024-02-08: 1000 mL via ORAL

## 2024-02-18 DIAGNOSIS — I10 Essential (primary) hypertension: Secondary | ICD-10-CM | POA: Diagnosis not present

## 2024-02-18 DIAGNOSIS — E1142 Type 2 diabetes mellitus with diabetic polyneuropathy: Secondary | ICD-10-CM | POA: Diagnosis not present

## 2024-02-18 DIAGNOSIS — E118 Type 2 diabetes mellitus with unspecified complications: Secondary | ICD-10-CM | POA: Diagnosis not present

## 2024-02-22 ENCOUNTER — Telehealth: Payer: Self-pay

## 2024-02-22 ENCOUNTER — Other Ambulatory Visit: Payer: Self-pay | Admitting: Gynecologic Oncology

## 2024-02-22 DIAGNOSIS — N289 Disorder of kidney and ureter, unspecified: Secondary | ICD-10-CM

## 2024-02-22 DIAGNOSIS — N2889 Other specified disorders of kidney and ureter: Secondary | ICD-10-CM

## 2024-02-22 NOTE — Telephone Encounter (Signed)
-----   Message from Eleanor JONETTA Epps sent at 02/22/2024  2:31 PM EDT ----- Please let the patient know about her CT scan results:  -They note calcifications in the vessels around the heart. -They note tiny bilateral pulm nodules. These are stable in size. -They note older gallstones. No causing obstruction or inflammation. -They note a small area in the left kidney. This was there in 02/2022 and is similar in appearance. They recommend MRI to look at area more closely. OVERALL, STABLE EXAM WITH NO NEW FINDINGS ON CT.   Based on the left kidney finding, the radiologist is recommending an MRI to look at this area more closely.  This has been ordered, please schedule.

## 2024-02-22 NOTE — Telephone Encounter (Signed)
 Office received a call from Ambrose Long CT, regarding the recent Ct chest A/P on 02/11/24.   radiologist is wanting to make Dr.Tucker isaware of the impression #3. MRI recommended for a 13 mm subtle hypoenhancing lesion in the interpolar left kidney.May be complex cyst, sm.neoplasm is a concern.  Message sent to Dr.Tucker.

## 2024-02-22 NOTE — Telephone Encounter (Signed)
 Per Eleanor Epps NP, I left a voicemail for [name] to call office regarding recent CT results and recommendation.

## 2024-02-23 ENCOUNTER — Ambulatory Visit: Payer: Self-pay | Admitting: Gynecologic Oncology

## 2024-02-23 NOTE — Telephone Encounter (Signed)
 2nd attempt to reach [name] regarding recent CT results and recommendation for MRI.

## 2024-02-23 NOTE — Telephone Encounter (Signed)
 [name] returned call, she is aware of her CT result and with needing an MRI.   MRI scheduled for 8/26 @ 10:30. Pt agrees to date/time

## 2024-02-24 ENCOUNTER — Encounter: Payer: Self-pay | Admitting: Gynecologic Oncology

## 2024-02-29 ENCOUNTER — Ambulatory Visit (HOSPITAL_COMMUNITY)
Admission: RE | Admit: 2024-02-29 | Discharge: 2024-02-29 | Disposition: A | Discharge status: Home / Self Care | Source: Ambulatory Visit | Attending: Gynecologic Oncology | Admitting: Gynecologic Oncology

## 2024-02-29 DIAGNOSIS — N289 Disorder of kidney and ureter, unspecified: Secondary | ICD-10-CM | POA: Insufficient documentation

## 2024-02-29 DIAGNOSIS — N281 Cyst of kidney, acquired: Secondary | ICD-10-CM | POA: Diagnosis not present

## 2024-02-29 DIAGNOSIS — N2889 Other specified disorders of kidney and ureter: Secondary | ICD-10-CM | POA: Insufficient documentation

## 2024-02-29 DIAGNOSIS — K8020 Calculus of gallbladder without cholecystitis without obstruction: Secondary | ICD-10-CM | POA: Diagnosis not present

## 2024-02-29 MED ORDER — GADOBUTROL 1 MMOL/ML IV SOLN
10.0000 mL | Freq: Once | INTRAVENOUS | Status: AC | PRN
  Administered 2024-02-29: 10 mL via INTRAVENOUS

## 2024-03-02 ENCOUNTER — Inpatient Hospital Stay: Attending: Gynecologic Oncology | Admitting: Gynecologic Oncology

## 2024-03-02 ENCOUNTER — Encounter: Payer: Self-pay | Admitting: Gynecologic Oncology

## 2024-03-02 VITALS — BP 131/58 | HR 72 | Temp 98.7°F | Resp 19 | Wt 243.8 lb

## 2024-03-02 DIAGNOSIS — Z8544 Personal history of malignant neoplasm of other female genital organs: Secondary | ICD-10-CM | POA: Insufficient documentation

## 2024-03-02 DIAGNOSIS — Z9079 Acquired absence of other genital organ(s): Secondary | ICD-10-CM | POA: Diagnosis not present

## 2024-03-02 DIAGNOSIS — C519 Malignant neoplasm of vulva, unspecified: Secondary | ICD-10-CM

## 2024-03-02 DIAGNOSIS — N289 Disorder of kidney and ureter, unspecified: Secondary | ICD-10-CM

## 2024-03-02 NOTE — Progress Notes (Signed)
 Gynecologic Oncology Return Clinic Visit  03/02/24  Reason for Visit: surveillance   Treatment History: 08/12/21: Vulvar biopsy revealed vulvar melanoma   09/12/21: Mild hypermetabolism in the lower vulva likely related to recent biopsy/surgery.  No discrete hypermetabolic mass.  No adenopathy.   11/04/2021: Left vulvar wide local excision and left inguinal sentinel lymph node biopsy with Surgical Oncology. Operative Findings: 1 cm melanotic lesion on medial aspect of anterior left labia minora. 1 sentinel lymph node identified in the subcutaneous fat superficial to the external oblique.  Pathology:  Diagnosis  A: Sentinel lymph node, left inguinal, removal - Negative for melanoma in one lymph node (0/1). B: Vulva, excision - Malignant melanoma. Melanoma in situ extends broadly to 11-12-10 o'clock peripheral margin and focally to the 3 o'clock peripheral margin. Margins are negative for invasive melanoma. Type: Vulvar Clark Level: IV Breslow thickness: 1.8 mm Growth phase: Vertical, spindled melanocytes with light pigment Mitoses: per square millimeter Nuclear grade: 2/3 Tumor infiltrating lymphocytes:  Regression: Not identified Ulceration: Not identified  Microscopic satellite: Not identified Vascular invasion: Not identified Perineural invasion: Not identified Associated nevus: Not identified  Margins: Melanoma in situ extends broadly to 11-12-10 o'clock peripheral margin (B3-B5) and focally to the 3 o'clock peripheral margin (B3). Margins are negative for invasive melanoma Pathologic (pT, AJCC 8th edition) staging: pT2a C: Vulva, deep margin, excision - Connective tissue.  - Negative for melanoma   11/19/2021 Tumor Board recommendations.  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 Plan: Repeat excision of vulvar melanoma in situ  - Medical Oncology referral  for consideration of additional treatment (observation vs pembro vs locoregional radiation), scheduled 5/22 Medical oncology recommendations: CT C/A/P q 3 months for 1 year then q 6 months until year 3, then annual. Consider imiquimod  for the in situ component if the margins on reexcision were to show melanoma in situ. Obtain full molecular profile with Tempus testing - testing showed no targetable mutations or obvious germline mutation.   12/24/2021: Partial simple left anterior vulvectomy given margins positive for vulvar melanoma in situ.  Prior scar identified, no definitive evidence visually of melanoma lesion.  mL in situ noted involving all margins.  No residual invasive disease noted.   02/2022: CT chest, abdomen, pelvis shows multiple bilateral pulmonary nodules including previously described 5 mm left lower lobe nodule.  There is suggestion of mild increase in size of 8 mm left upper lobe nodule.  Nonspecific, recommended close follow-up.  Interval postsurgical changes in the left inguinal region.  Too small to characterize 9 mm low-attenuation lesion in the left hepatic lobe, recommended attention on follow-up.   05/17/22: CT C/A/P:  1. Interval increase in size of prominent left external iliac and inguinal lymph nodes including 1 directly adjacent to the left inguinal surgical clips, nonspecific and possibly reactive but suspicious for nodal metastatic disease.  2. Stable nonspecific bilateral pulmonary nodules, continued attention on follow-up imaging suggested. 3. Stable 9 mm hypodensity in the left hepatic lobe, technically too small to accurately characterize but distinctly not typical in appearance for that of a melanoma metastasis favored to reflect a cyst or hemangioma. Suggest continued attention on follow-up imaging. 4. Increased prominence of the left gonadal vein and pelvic collateral vessels, which can be seen in the setting of pelvic venous insufficiency. 5. Hepatic  steatosis. 6. Cholelithiasis without findings of acute cholecystitis. 7.  Aortic Atherosclerosis (ICD10-I70.0).   07/17/22: Vulvar  and peri-clitoral biopsies after Aldara  treatment A. VULVA, LEFT LATERAL, 1:00, BIOPSY: NO EVIDENCE OF MELANOMA B. VULVA, LEFT LATERAL, 2:00, BIOPSY: ATYPICAL LENTIGINOUS MELANOCYTIC PROLIFERATION, SEE COMMENT C. VULVA, LEFT LATERAL, 3:00, BIOPSY: ATYPICAL LENTIGINOUS MELANOCYTIC PROLIFERATION, SEE COMMENT D. VULVA, LEFT, SUPERIOR CLITORAL FOLD, BIOPSY: SCATTERED JUNCTIONAL MELANOCYTES, NOT DIAGNOSTIC OF MALIGNANCY, SEE COMMENT E. VULVA, LEFT LATERAL, CLITORIS, BIOPSY: NO EVIDENCE OF MELANOMA F. VULVA, LEFT MEDIAL, 3:00, BIOPSY: NO EVIDENCE OF MELANOMA G. VULVA, LEFT CENTRAL, BIOPSY: NO EVIDENCE OF MELANOMA COMMENT: Numerous levels of each biopsy were evaluated by H and E as well as MelanA stain. Negative biopsies are noted above. In biopsies at 2 and 3 o'clock, there is an increase in junctional melanocytes, but without upward growth nor nest formation. This is an atypical lentiginous melanocytic proliferation. It may represent partially-treated previous melanoma but is not diagnostic for such. The superior clitoral fold exhibits scattered junctional melanocytes, which are evenly spaced and not diagnostic of malignancy. Followup and/or additional biopsies are recommended. Drs. Lorriane and Melia have a similar opinion.    08/14/22: CT C/A/P: 1. No convincing evidence of new or progressive metastatic disease within the chest, abdomen or pelvis. 2. Low-density filling defect in the left upper lobe lateral segmental bronchus with corresponding atelectasis and new prominent left hilar nodal tissue, nonspecific findings which are favored to reflect aspiration/mucous in the airway with reactive hilar nodal tissue. However while felt less likely a metastatic endobronchial lesion is not entirely excluded and warrants further evaluation with short-term interval follow-up chest CT  versus bronchoscopy. 3. Stable bilateral pulmonary nodules, favored benign. Continued attention on follow-up imaging suggested. 4. Cholelithiasis without findings of acute cholecystitis. 5.  Aortic Atherosclerosis (ICD10-I70.0).   10/23/22: Multiple vulvar biopsies A. CLITORIS, LAT, BIOPSY:  -  Skin with mild dermal fibrosis, otherwise no significant pathology.  -  No morphologic evidence of melanocytic proliferation (clinical  history of melanoma noted).  B. VULVA, LAT, 2:00, BIOPSY:  -  Skin with mild dermal fibrosis, otherwise no significant pathology  -  No morphologic evidence of melanocytic proliferation (clinical  history of melanoma noted).    11/2022: CT C/A/P -stable bilateral pulmonary nodules, no evidence of new or progressive disease.   01/29/2023: Vulvar biopsy at 1:00 shows reactive squamous mucosa with spongiosis, hyperparakeratosis and mild chronic inflammation.  No dysplasia or melanocytic atypia.   02/2023: CT C/A/P -stable bilateral pulmonary nodules, no evidence of new or progressive disease.   08/24/23: CT C/A/P -stable bilateral pulmonary nodules, no evidence of new or progressive disease.  Cholelithiasis without acute cholecystitis.  Colonic diverticulosis noted.  Interval History: Overall doing well.  Denies any vulvar pain, irritation, bleeding, or discharge.  Reports that she was treated for urinary tract infection about 2 months ago.  Past Medical/Surgical History: Past Medical History:  Diagnosis Date   Asthma    has rescue inhaler, now on Flovent    Chronic back pain    Diabetes mellitus without complication (HCC)    on medformin, on Ozempic    GERD (gastroesophageal reflux disease)    History of COVID-19 04/05/2021   Hyperlipemia    Hypertension    Hypothyroidism    Insomnia    melanoma 11/2021   vulva   OAB (overactive bladder)    Seasonal allergies    Sleep apnea    uses a cpap    Past Surgical History:  Procedure Laterality Date   ANKLE  ARTHROTOMY  2003   left-fx   APPENDECTOMY     back fusion  02/14/2019  L4 back fusion   BACK SURGERY  2010   lumb disk   BREAST LUMPECTOMY WITH RADIOACTIVE SEED LOCALIZATION Left 06/13/2019   Procedure: LEFT BREAST LUMPECTOMY WITH RADIOACTIVE SEED LOCALIZATION;  Surgeon: Vanderbilt Ned, MD;  Location: [facility];  Service: General;  Laterality: Left;   CARPAL TUNNEL RELEASE     rt   CARPAL TUNNEL RELEASE Left 08/01/2013   Procedure: LEFT CARPAL TUNNEL RELEASE;  Surgeon: Arley JONELLE Curia, MD;  Location: [facility];  Service: Orthopedics;  Laterality: Left;   CESAREAN SECTION  83,86   COLONOSCOPY     DILATATION & CURETTAGE/HYSTEROSCOPY WITH MYOSURE N/A 08/23/2018   Procedure: DILATATION & CURETTAGE/HYSTEROSCOPY WITH MYOSURE POLYPECTOMY;  Surgeon: Cathlyn JAYSON Nikki Bobie FORBES, MD;  Location: [facility];  Service: Gynecology;  Laterality: N/A;  fibroid resection   DILATION AND CURETTAGE OF UTERUS     x2   EXCISION MASS NECK N/A 11/09/2023   Procedure: EXCISION, MASS, NECK;  Surgeon: Vanderbilt Ned, MD;  Location: [facility];  Service: General;  Laterality: N/A;   EXCISION MASS, BACK Bilateral 11/09/2023   Procedure: EXCISION MASS, BACK;  Surgeon: Vanderbilt Ned, MD;  Location: [facility];  Service: General;  Laterality: Bilateral;  EXCISION LIPOMA POSTERIOR RIGHT AND LEFT OF BACK   MASS EXCISION N/A 08/04/2017   Procedure: EXCISION LIPOMA UPPER BACK x2 AND NECK;  Surgeon: Vanderbilt Ned, MD;  Location: [facility];  Service: General;  Laterality: N/A;   NASAL SEPTOPLASTY W/ TURBINOPLASTY Bilateral 06/21/2015   Procedure: NASAL SEPTOPLASTY WITH BILATERAL TURBINATE REDUCTION;  Surgeon: Alm Bouche, MD;  Location: [facility];  Service: ENT;  Laterality: Bilateral;   SINOSCOPY     TONSILLECTOMY     TUBAL LIGATION     VULVECTOMY N/A 12/24/2021   Procedure: WIDE EXCISION VULVECTOMY;  Surgeon: Viktoria Comer JONELLE, MD;   Location: [facility];  Service: Gynecology;  Laterality: N/A;    Family History  Problem Relation Age of Onset   Allergic rhinitis Father    Allergic rhinitis Sister    Asthma Sister    Endometrial cancer Sister 50   Multiple myeloma Sister    Cancer Maternal Grandmother    Asthma Paternal Aunt    Angioedema Neg Hx    Eczema Neg Hx    Immunodeficiency Neg Hx    Urticaria Neg Hx    Ovarian cancer Neg Hx    Breast cancer Neg Hx    Colon cancer Neg Hx    Prostate cancer Neg Hx    Pancreatic cancer Neg Hx     Social History   Socioeconomic History   Marital status: Married    Spouse name: Not on file   Number of children: Not on file   Years of education: Not on file   Highest education level: Not on file  Occupational History   Occupation: retored  Tobacco Use   Smoking status: Never   Smokeless tobacco: Never  Vaping Use   Vaping status: Never Used  Substance and Sexual Activity   Alcohol use: Yes    Comment: 2 glasses of wine/month   Drug use: No   Sexual activity: Yes    Birth control/protection: Post-menopausal  Other Topics Concern   Not on file  Social History Narrative   Not on file   Social Drivers of Health   Financial Resource Strain: Low Risk  (12/28/2023)   Received from [facility]   Overall Financial Resource  Strain (CARDIA)    Difficulty of Paying Living Expenses: Not hard at all  Food Insecurity: No Food Insecurity (12/28/2023)   Received from [facility]   Hunger Vital Sign    Within the past 12 months, you worried that your food would run out before you got the money to buy more.: Never true    Within the past 12 months, the food you bought just didn't last and you didn't have money to get more.: Never true  Transportation Needs: No Transportation Needs (12/28/2023)   Received from [facility] - Transportation    Lack of Transportation (Medical): No    Lack of Transportation (Non-Medical): No  Physical  Activity: Not on file  Stress: Not on file  Social Connections: Not on file    Current Medications:  Current Outpatient Medications:    5-Hydroxytryptophan (5-HTP PO), Take 1 tablet by mouth daily., Disp: , Rfl:    albuterol  (VENTOLIN  HFA) 108 (90 Base) MCG/ACT inhaler, Inhale 2 puffs into the lungs every 4 (four) hours as needed for wheezing or shortness of breath., Disp: , Rfl:    amLODipine (NORVASC) 5 MG tablet, Take 5 mg by mouth daily., Disp: , Rfl:    Azelastine  HCl 137 MCG/SPRAY SOLN, USE 2 SPRAYS IN EACH NOSTRIL 2 TIMES A DAY AS DIRECTED, Disp: 30 mL, Rfl: 5   baclofen (LIORESAL) 20 MG tablet, Take 20 mg by mouth See admin instructions. Take 20 mg daily, may take up to 3 times daily as needed for spasms. Pt takes once a day, Disp: , Rfl:    CINNAMON PO, Take 1,000 mg by mouth in the morning and at bedtime., Disp: , Rfl:    diclofenac Sodium (VOLTAREN) 1 % GEL, Apply 2 g topically 4 (four) times daily as needed (pain)., Disp: , Rfl:    famotidine-calcium  carbonate-magnesium hydroxide (PEPCID COMPLETE) 10-800-165 MG chewable tablet, Chew 1 tablet by mouth every evening., Disp: , Rfl:    fluticasone  (FLONASE ) 50 MCG/ACT nasal spray, Place 1 spray into both nostrils daily., Disp: , Rfl:    fluticasone  (FLOVENT  HFA) 44 MCG/ACT inhaler, INHALE 2 PUFFS INTO THE LUNGS IN THE MORNING AND AT BEDTIME. INHALE 1 PUFF BY MOUTH EVERY DAY, Disp: 10.6 each, Rfl: 5   gabapentin  (NEURONTIN ) 400 MG capsule, Take 400-800 mg by mouth See admin instructions. Take 400 mg in the morning and 800 mg at night, Disp: , Rfl:    GEMTESA 75 MG TABS, 1 tablet Orally Once a day for 30 day(s), Disp: , Rfl:    glucose blood test strip, as directed finger stick once a day for 90 days, Disp: , Rfl:    hydrochlorothiazide (MICROZIDE) 12.5 MG capsule, Take 12.5 mg by mouth daily., Disp: , Rfl:    ibuprofen  (ADVIL ) 800 MG tablet, Take 1 tablet (800 mg total) by mouth every 8 (eight) hours as needed., Disp: 30 tablet, Rfl: 0    levocetirizine (XYZAL ) 5 MG tablet, Take 0.5 tablets (2.5 mg total) by mouth every evening., Disp: 90 tablet, Rfl: 1   levothyroxine (SYNTHROID) 112 MCG tablet, Take 112 mcg by mouth daily before breakfast., Disp: , Rfl:    losartan (COZAAR) 100 MG tablet, Take 100 mg by mouth daily., Disp: , Rfl:    Melatonin 3 MG TABS, Take 3 mg by mouth at bedtime., Disp: , Rfl:    meloxicam (MOBIC) 7.5 MG tablet, Take 7.5 mg by mouth 2 (two) times daily as needed for pain., Disp: , Rfl:  Mometasone Furoate  (ASMANEX  HFA) 50 MCG/ACT AERO, 2 puffs twice a day., Disp: 13 g, Rfl: 5   montelukast  (SINGULAIR ) 10 MG tablet, TAKE 1 TABLET BY MOUTH EVERYDAY AT BEDTIME, Disp: 30 tablet, Rfl: 5   MOUNJARO 15 MG/0.5ML Pen, SMARTSIG:15 Milligram(s) SUB-Q Every 4 Weeks, Disp: , Rfl:    Multiple Vitamin (MULTIVITAMIN WITH MINERALS) TABS tablet, Take 1 tablet by mouth at bedtime., Disp: , Rfl:    oseltamivir (TAMIFLU) 75 MG capsule, 1 capsule Orally once a day for 10 days, Disp: , Rfl:    simvastatin (ZOCOR) 20 MG tablet, Take 20 mg by mouth at bedtime. , Disp: , Rfl:    Turmeric 500 MG CAPS, 1 capsule Orally once a day, Disp: , Rfl:    zolpidem (AMBIEN) 10 MG tablet, Take 5 mg by mouth at bedtime as needed for sleep. , Disp: , Rfl:   Review of Systems: + Back pain, muscle pain/cramps Denies appetite changes, fevers, chills, fatigue, unexplained weight changes. Denies hearing loss, neck lumps or masses, mouth sores, ringing in ears or voice changes. Denies cough or wheezing.  Denies shortness of breath. Denies chest pain or palpitations. Denies leg swelling. Denies abdominal distention, pain, blood in stools, constipation, diarrhea, nausea, vomiting, or early satiety. Denies pain with intercourse, dysuria, frequency, hematuria or incontinence. Denies hot flashes, pelvic pain, vaginal bleeding or vaginal discharge.   Denies joint pain. Denies itching, rash, or wounds. Denies dizziness, headaches, numbness or  seizures. Denies swollen lymph nodes or glands, denies easy bruising or bleeding. Denies anxiety, depression, confusion, or decreased concentration.  Physical Exam: BP (!) 131/58 (BP Location: Left Arm, Patient Position: Sitting)   Pulse 72   Temp 98.7 F (37.1 C) (Oral)   Resp 19   Wt 243 lb 12.8 oz (110.6 kg)   LMP 07/06/1998 (Approximate)   SpO2 95%   BMI 40.62 kg/m  General: Alert, oriented, no acute distress. HEENT: Normocephalic, atraumatic, sclera anicteric. Chest: Clear to auscultation bilaterally.  No wheezes or rhonchi. Cardiovascular: Regular rate and rhythm, no murmurs. Abdomen: Obese, soft, nontender.  Normoactive bowel sounds.  No masses or hepatosplenomegaly appreciated.   Extremities: Grossly normal range of motion.  Warm, well perfused.  No edema bilaterally. Skin: No rashes or lesions noted. Lymphatics: No palpable cervical, supraclavicular, or inguinal adenopathy. GU: Normal-appearing external female genitalia with some skin changes related to prior excisions and biopsies.  No atypical vascularity or hyperpigmented lesions.  No erythema noted on exam today.  On speculum exam, vaginal mucosa is mildly atrophic, no visible lesions.  Laboratory & Radiologic Studies: 02/08/24: CT C/A/P 1. Stable exam. No new or progressive findings in the chest, abdomen, or pelvis. 2. Stable tiny bilateral pulmonary nodules. Continued attention on follow-up warranted. 3. 13 mm subtle hypoenhancing lesion in the interpolar left kidney, best seen on delayed imaging. This was present on prior studies back to 02/09/2022 and is not substantially changed in the interval. This may be a complex cyst although small neoplasm is a concern. MRI of the abdomen with and without contrast recommended to further evaluate. 4. Cholelithiasis. 5.  Aortic Atherosclerosis (ICD10-I70.0).  MRI abdomen 02/29/24: 1. There is a 8 x 14 mm lesion in the left kidney interpolar region, posteriorly, which exhibits  microscopic fat within and peripheral thick rim like enhancement on the postcontrast images. Differential diagnosis includes renal cell carcinoma or lipid poor AML. Follow-up examination is recommended in 6-12 months. 2. Multiple other nonacute observations, as described above.  Assessment & Plan: [name]  is a 73 y.o. woman with a history of stage IB vulvar melanoma who underwent repeat excision in an attempt to achieve negative margins given MIS present. Unfortunately, margins still positive for melanoma in situ (11/2021). Patient was treated with Aldara  with post-treatment biopsies (07/2022) showing no definite melanoma in situ or melanoma.  Recent CT in 02/2024 showed stable pulmonary nodules, no evidence of metastatic disease.   She is overall doing well without evidence of disease on exam today.  I will see her for follow-up in 3 months.  Reviewed symptoms that should prompt a phone call before her next scheduled visit.  Discussed recent MRI results.  She will have continued imaging surveillance given melanoma history.  I will reach out to her urologist regarding any further workup that would be recommended for the renal lesion.   We have transitioned to CT scans every 6 months.  Her next will be in February 2026.    20 minutes of total time was spent for this patient encounter, including preparation, face-to-face counseling with the patient and coordination of care, and documentation of the encounter.  Comer Dollar, MD  Division of Gynecologic Oncology  Department of Obstetrics and Gynecology  [facility]

## 2024-03-02 NOTE — Patient Instructions (Signed)
 It was good to see you today.  I do not see or feel any evidence of cancer recurrence on your exam.  I will see you for follow-up in 3 months.  I will reach out to your urologist about recent MRI findings related to your kidney.  If you have not heard from me next time you see him, please make sure to bring this up at that appointment.  As always, if you develop any new and concerning symptoms before your next visit, please call to see me sooner.

## 2024-03-05 DIAGNOSIS — E039 Hypothyroidism, unspecified: Secondary | ICD-10-CM | POA: Diagnosis not present

## 2024-03-05 DIAGNOSIS — J4520 Mild intermittent asthma, uncomplicated: Secondary | ICD-10-CM | POA: Diagnosis not present

## 2024-03-05 DIAGNOSIS — I10 Essential (primary) hypertension: Secondary | ICD-10-CM | POA: Diagnosis not present

## 2024-03-05 DIAGNOSIS — F3341 Major depressive disorder, recurrent, in partial remission: Secondary | ICD-10-CM | POA: Diagnosis not present

## 2024-03-05 DIAGNOSIS — E1142 Type 2 diabetes mellitus with diabetic polyneuropathy: Secondary | ICD-10-CM | POA: Diagnosis not present

## 2024-03-05 DIAGNOSIS — E669 Obesity, unspecified: Secondary | ICD-10-CM | POA: Diagnosis not present

## 2024-03-05 DIAGNOSIS — E118 Type 2 diabetes mellitus with unspecified complications: Secondary | ICD-10-CM | POA: Diagnosis not present

## 2024-03-11 DIAGNOSIS — G4733 Obstructive sleep apnea (adult) (pediatric): Secondary | ICD-10-CM | POA: Diagnosis not present

## 2024-03-14 DIAGNOSIS — M1710 Unilateral primary osteoarthritis, unspecified knee: Secondary | ICD-10-CM | POA: Diagnosis not present

## 2024-03-14 DIAGNOSIS — E669 Obesity, unspecified: Secondary | ICD-10-CM | POA: Diagnosis not present

## 2024-03-14 DIAGNOSIS — G4700 Insomnia, unspecified: Secondary | ICD-10-CM | POA: Diagnosis not present

## 2024-03-14 DIAGNOSIS — I10 Essential (primary) hypertension: Secondary | ICD-10-CM | POA: Diagnosis not present

## 2024-03-14 DIAGNOSIS — N2889 Other specified disorders of kidney and ureter: Secondary | ICD-10-CM | POA: Diagnosis not present

## 2024-03-14 DIAGNOSIS — Z23 Encounter for immunization: Secondary | ICD-10-CM | POA: Diagnosis not present

## 2024-03-14 DIAGNOSIS — E1142 Type 2 diabetes mellitus with diabetic polyneuropathy: Secondary | ICD-10-CM | POA: Diagnosis not present

## 2024-03-19 DIAGNOSIS — E118 Type 2 diabetes mellitus with unspecified complications: Secondary | ICD-10-CM | POA: Diagnosis not present

## 2024-03-19 DIAGNOSIS — I10 Essential (primary) hypertension: Secondary | ICD-10-CM | POA: Diagnosis not present

## 2024-03-19 DIAGNOSIS — E1142 Type 2 diabetes mellitus with diabetic polyneuropathy: Secondary | ICD-10-CM | POA: Diagnosis not present

## 2024-03-31 DIAGNOSIS — G4733 Obstructive sleep apnea (adult) (pediatric): Secondary | ICD-10-CM | POA: Diagnosis not present

## 2024-04-04 DIAGNOSIS — F3341 Major depressive disorder, recurrent, in partial remission: Secondary | ICD-10-CM | POA: Diagnosis not present

## 2024-04-04 DIAGNOSIS — E118 Type 2 diabetes mellitus with unspecified complications: Secondary | ICD-10-CM | POA: Diagnosis not present

## 2024-04-04 DIAGNOSIS — E1142 Type 2 diabetes mellitus with diabetic polyneuropathy: Secondary | ICD-10-CM | POA: Diagnosis not present

## 2024-04-04 DIAGNOSIS — I10 Essential (primary) hypertension: Secondary | ICD-10-CM | POA: Diagnosis not present

## 2024-04-04 DIAGNOSIS — J4520 Mild intermittent asthma, uncomplicated: Secondary | ICD-10-CM | POA: Diagnosis not present

## 2024-04-04 DIAGNOSIS — E669 Obesity, unspecified: Secondary | ICD-10-CM | POA: Diagnosis not present

## 2024-04-04 DIAGNOSIS — E039 Hypothyroidism, unspecified: Secondary | ICD-10-CM | POA: Diagnosis not present

## 2024-04-11 DIAGNOSIS — L821 Other seborrheic keratosis: Secondary | ICD-10-CM | POA: Diagnosis not present

## 2024-04-11 DIAGNOSIS — L578 Other skin changes due to chronic exposure to nonionizing radiation: Secondary | ICD-10-CM | POA: Diagnosis not present

## 2024-04-11 DIAGNOSIS — C510 Malignant neoplasm of labium majus: Secondary | ICD-10-CM | POA: Diagnosis not present

## 2024-04-11 DIAGNOSIS — D239 Other benign neoplasm of skin, unspecified: Secondary | ICD-10-CM | POA: Diagnosis not present

## 2024-04-11 DIAGNOSIS — D225 Melanocytic nevi of trunk: Secondary | ICD-10-CM | POA: Diagnosis not present

## 2024-04-11 DIAGNOSIS — B009 Herpesviral infection, unspecified: Secondary | ICD-10-CM | POA: Diagnosis not present

## 2024-04-11 DIAGNOSIS — L820 Inflamed seborrheic keratosis: Secondary | ICD-10-CM | POA: Diagnosis not present

## 2024-04-11 DIAGNOSIS — L814 Other melanin hyperpigmentation: Secondary | ICD-10-CM | POA: Diagnosis not present

## 2024-04-17 DIAGNOSIS — G4733 Obstructive sleep apnea (adult) (pediatric): Secondary | ICD-10-CM | POA: Diagnosis not present

## 2024-04-18 DIAGNOSIS — E1142 Type 2 diabetes mellitus with diabetic polyneuropathy: Secondary | ICD-10-CM | POA: Diagnosis not present

## 2024-04-18 DIAGNOSIS — E118 Type 2 diabetes mellitus with unspecified complications: Secondary | ICD-10-CM | POA: Diagnosis not present

## 2024-04-18 DIAGNOSIS — I10 Essential (primary) hypertension: Secondary | ICD-10-CM | POA: Diagnosis not present

## 2024-04-25 DIAGNOSIS — M170 Bilateral primary osteoarthritis of knee: Secondary | ICD-10-CM | POA: Diagnosis not present

## 2024-04-26 DIAGNOSIS — R350 Frequency of micturition: Secondary | ICD-10-CM | POA: Diagnosis not present

## 2024-04-26 DIAGNOSIS — R351 Nocturia: Secondary | ICD-10-CM | POA: Diagnosis not present

## 2024-04-26 DIAGNOSIS — N3281 Overactive bladder: Secondary | ICD-10-CM | POA: Diagnosis not present

## 2024-05-05 DIAGNOSIS — J4520 Mild intermittent asthma, uncomplicated: Secondary | ICD-10-CM | POA: Diagnosis not present

## 2024-05-05 DIAGNOSIS — E039 Hypothyroidism, unspecified: Secondary | ICD-10-CM | POA: Diagnosis not present

## 2024-05-05 DIAGNOSIS — F3341 Major depressive disorder, recurrent, in partial remission: Secondary | ICD-10-CM | POA: Diagnosis not present

## 2024-05-05 DIAGNOSIS — E118 Type 2 diabetes mellitus with unspecified complications: Secondary | ICD-10-CM | POA: Diagnosis not present

## 2024-05-05 DIAGNOSIS — I10 Essential (primary) hypertension: Secondary | ICD-10-CM | POA: Diagnosis not present

## 2024-05-05 DIAGNOSIS — E1142 Type 2 diabetes mellitus with diabetic polyneuropathy: Secondary | ICD-10-CM | POA: Diagnosis not present

## 2024-05-05 DIAGNOSIS — E669 Obesity, unspecified: Secondary | ICD-10-CM | POA: Diagnosis not present

## 2024-05-16 ENCOUNTER — Encounter: Payer: Self-pay | Admitting: Gynecologic Oncology

## 2024-05-17 NOTE — Patient Instructions (Addendum)
 It was good to see you today.  I do not see or feel any evidence of cancer recurrence on your exam.   In regards to the renal lesion, Dr. Viktoria is deferring the decision to your urologist if they feel another MRI is needed.  Dr. Viktoria will see you for follow-up in 3 months. Plan on having a CT scan prior to this appointment. Nothing to eat or drink 4 hours before your scan. Plan to arrive two hours before to drink oral contrast for the scan.   As always, if you develop any new and concerning symptoms before your next visit, please call to see me sooner.

## 2024-05-17 NOTE — Progress Notes (Signed)
 Gynecologic Oncology Return Clinic Visit  05/18/2024  Reason for Visit: surveillance   Treatment History: 08/12/21: Vulvar biopsy revealed vulvar melanoma   09/12/21: Mild hypermetabolism in the lower vulva likely related to recent biopsy/surgery.  No discrete hypermetabolic mass.  No adenopathy.   11/04/2021: Left vulvar wide local excision and left inguinal sentinel lymph node biopsy with Surgical Oncology. Operative Findings: 1 cm melanotic lesion on medial aspect of anterior left labia minora. 1 sentinel lymph node identified in the subcutaneous fat superficial to the external oblique.  Pathology:  Diagnosis  A: Sentinel lymph node, left inguinal, removal - Negative for melanoma in one lymph node (0/1). B: Vulva, excision - Malignant melanoma. Melanoma in situ extends broadly to 11-12-10 o'clock peripheral margin and focally to the 3 o'clock peripheral margin. Margins are negative for invasive melanoma. Type: Vulvar Clark Level: IV Breslow thickness: 1.8 mm Growth phase: Vertical, spindled melanocytes with light pigment Mitoses: per square millimeter Nuclear grade: 2/3 Tumor infiltrating lymphocytes:  Regression: Not identified Ulceration: Not identified  Microscopic satellite: Not identified Vascular invasion: Not identified Perineural invasion: Not identified Associated nevus: Not identified  Margins: Melanoma in situ extends broadly to 11-12-10 o'clock peripheral margin (B3-B5) and focally to the 3 o'clock peripheral margin (B3). Margins are negative for invasive melanoma Pathologic (pT, AJCC 8th edition) staging: pT2a C: Vulva, deep margin, excision - Connective tissue.  - Negative for melanoma   11/19/2021 Tumor Board recommendations.  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 Plan: Repeat excision of vulvar melanoma in situ  - Medical Oncology  referral for consideration of additional treatment (observation vs pembro vs locoregional radiation), scheduled 5/22 Medical oncology recommendations: CT C/A/P q 3 months for 1 year then q 6 months until year 3, then annual. Consider imiquimod  for the in situ component if the margins on reexcision were to show melanoma in situ. Obtain full molecular profile with Tempus testing - testing showed no targetable mutations or obvious germline mutation.   12/24/2021: Partial simple left anterior vulvectomy given margins positive for vulvar melanoma in situ.  Prior scar identified, no definitive evidence visually of melanoma lesion.  mL in situ noted involving all margins.  No residual invasive disease noted.   02/2022: CT chest, abdomen, pelvis shows multiple bilateral pulmonary nodules including previously described 5 mm left lower lobe nodule.  There is suggestion of mild increase in size of 8 mm left upper lobe nodule.  Nonspecific, recommended close follow-up.  Interval postsurgical changes in the left inguinal region.  Too small to characterize 9 mm low-attenuation lesion in the left hepatic lobe, recommended attention on follow-up.   05/17/22: CT C/A/P:  1. Interval increase in size of prominent left external iliac and inguinal lymph nodes including 1 directly adjacent to the left inguinal surgical clips, nonspecific and possibly reactive but suspicious for nodal metastatic disease.  2. Stable nonspecific bilateral pulmonary nodules, continued attention on follow-up imaging suggested. 3. Stable 9 mm hypodensity in the left hepatic lobe, technically too small to accurately characterize but distinctly not typical in appearance for that of a melanoma metastasis favored to reflect a cyst or hemangioma. Suggest continued attention on follow-up imaging. 4. Increased prominence of the left gonadal vein and pelvic collateral vessels, which can be seen in the setting of pelvic venous insufficiency. 5. Hepatic  steatosis. 6. Cholelithiasis without findings of acute cholecystitis. 7.  Aortic Atherosclerosis (ICD10-I70.0).   07/17/22: Vulvar  and peri-clitoral biopsies after Aldara  treatment A. VULVA, LEFT LATERAL, 1:00, BIOPSY: NO EVIDENCE OF MELANOMA B. VULVA, LEFT LATERAL, 2:00, BIOPSY: ATYPICAL LENTIGINOUS MELANOCYTIC PROLIFERATION, SEE COMMENT C. VULVA, LEFT LATERAL, 3:00, BIOPSY: ATYPICAL LENTIGINOUS MELANOCYTIC PROLIFERATION, SEE COMMENT D. VULVA, LEFT, SUPERIOR CLITORAL FOLD, BIOPSY: SCATTERED JUNCTIONAL MELANOCYTES, NOT DIAGNOSTIC OF MALIGNANCY, SEE COMMENT E. VULVA, LEFT LATERAL, CLITORIS, BIOPSY: NO EVIDENCE OF MELANOMA F. VULVA, LEFT MEDIAL, 3:00, BIOPSY: NO EVIDENCE OF MELANOMA G. VULVA, LEFT CENTRAL, BIOPSY: NO EVIDENCE OF MELANOMA COMMENT: Numerous levels of each biopsy were evaluated by H and E as well as MelanA stain. Negative biopsies are noted above. In biopsies at 2 and 3 o'clock, there is an increase in junctional melanocytes, but without upward growth nor nest formation. This is an atypical lentiginous melanocytic proliferation. It may represent partially-treated previous melanoma but is not diagnostic for such. The superior clitoral fold exhibits scattered junctional melanocytes, which are evenly spaced and not diagnostic of malignancy. Followup and/or additional biopsies are recommended. Drs. Lorriane and Melia have a similar opinion.    08/14/22: CT C/A/P: 1. No convincing evidence of new or progressive metastatic disease within the chest, abdomen or pelvis. 2. Low-density filling defect in the left upper lobe lateral segmental bronchus with corresponding atelectasis and new prominent left hilar nodal tissue, nonspecific findings which are favored to reflect aspiration/mucous in the airway with reactive hilar nodal tissue. However while felt less likely a metastatic endobronchial lesion is not entirely excluded and warrants further evaluation with short-term interval follow-up chest CT  versus bronchoscopy. 3. Stable bilateral pulmonary nodules, favored benign. Continued attention on follow-up imaging suggested. 4. Cholelithiasis without findings of acute cholecystitis. 5.  Aortic Atherosclerosis (ICD10-I70.0).   10/23/22: Multiple vulvar biopsies A. CLITORIS, LAT, BIOPSY:  -  Skin with mild dermal fibrosis, otherwise no significant pathology.  -  No morphologic evidence of melanocytic proliferation (clinical  history of melanoma noted).  B. VULVA, LAT, 2:00, BIOPSY:  -  Skin with mild dermal fibrosis, otherwise no significant pathology  -  No morphologic evidence of melanocytic proliferation (clinical  history of melanoma noted).    11/2022: CT C/A/P -stable bilateral pulmonary nodules, no evidence of new or progressive disease.   01/29/2023: Vulvar biopsy at 1:00 shows reactive squamous mucosa with spongiosis, hyperparakeratosis and mild chronic inflammation.  No dysplasia or melanocytic atypia.   02/2023: CT C/A/P -stable bilateral pulmonary nodules, no evidence of new or progressive disease.   08/24/23: CT C/A/P -stable bilateral pulmonary nodules, no evidence of new or progressive disease.  Cholelithiasis without acute cholecystitis.  Colonic diverticulosis noted.  02/08/24: CT C/A/P-stable exam, stable pulm nodules, 13 mm subtle hypo-enhancing lesion in the left kidney.  8/26/225: MR abdomen- 1. There is a 8 x 14 mm lesion in the left kidney interpolar region, posteriorly, which exhibits microscopic fat within and peripheral thick rim like enhancement on the post contrast images. Differential diagnosis includes renal cell carcinoma or lipid poor AML. Follow-up examination is recommended in 6-12 months. 2. Multiple other nonacute observations, as described above.  Interval History: Overall doing well.  Denies any vulvar pain, irritation, bleeding, or discharge. She has been following with Dr. Gaston with urology. She has been treated for overactive bladder and has  been taking gemtesa with little improvement. She is being set up for botox injections in the bladder/urethra at the end of this year. In regards to the renal lesion noted on MR, patient states Dr. Gaston is not recommending intervention at this time and plans to refer  her to see a nephrologist in the near future. She also will be having gel injected in both knees since she has bone on bone in bilateral knees.   She stays active and cares for her grandchildren. She has noticed an increase in her weight. Tolerating diet with no nausea or emesis. No pain reported. Denies chest pain, dyspnea. No issues with lower extremities in regards to edema. She has seen her dermatologist at the end of September and follows with ophthalmology as well.     Past Medical/Surgical History: Past Medical History:  Diagnosis Date   Asthma    has rescue inhaler, now on Flovent    Chronic back pain    Diabetes mellitus without complication (HCC)    on medformin, on Ozempic    GERD (gastroesophageal reflux disease)    History of COVID-19 04/05/2021   Hyperlipemia    Hypertension    Hypothyroidism    Insomnia    melanoma 11/2021   vulva   OAB (overactive bladder)    Seasonal allergies    Sleep apnea    uses a cpap    Past Surgical History:  Procedure Laterality Date   ANKLE ARTHROTOMY  2003   left-fx   APPENDECTOMY     back fusion  02/14/2019   L4 back fusion   BACK SURGERY  2010   lumb disk   BREAST LUMPECTOMY WITH RADIOACTIVE SEED LOCALIZATION Left 06/13/2019   Procedure: LEFT BREAST LUMPECTOMY WITH RADIOACTIVE SEED LOCALIZATION;  Surgeon: Vanderbilt Ned, MD;  Location: [facility];  Service: General;  Laterality: Left;   CARPAL TUNNEL RELEASE     rt   CARPAL TUNNEL RELEASE Left 08/01/2013   Procedure: LEFT CARPAL TUNNEL RELEASE;  Surgeon: Arley JONELLE Curia, MD;  Location: [facility];  Service: Orthopedics;  Laterality: Left;   CESAREAN SECTION  83,86   COLONOSCOPY      DILATATION & CURETTAGE/HYSTEROSCOPY WITH MYOSURE N/A 08/23/2018   Procedure: DILATATION & CURETTAGE/HYSTEROSCOPY WITH MYOSURE POLYPECTOMY;  Surgeon: Cathlyn JAYSON Nikki Bobie FORBES, MD;  Location: [facility];  Service: Gynecology;  Laterality: N/A;  fibroid resection   DILATION AND CURETTAGE OF UTERUS     x2   EXCISION MASS NECK N/A 11/09/2023   Procedure: EXCISION, MASS, NECK;  Surgeon: Vanderbilt Ned, MD;  Location: [facility];  Service: General;  Laterality: N/A;   EXCISION MASS, BACK Bilateral 11/09/2023   Procedure: EXCISION MASS, BACK;  Surgeon: Vanderbilt Ned, MD;  Location: [facility];  Service: General;  Laterality: Bilateral;  EXCISION LIPOMA POSTERIOR RIGHT AND LEFT OF BACK   MASS EXCISION N/A 08/04/2017   Procedure: EXCISION LIPOMA UPPER BACK x2 AND NECK;  Surgeon: Vanderbilt Ned, MD;  Location: [facility];  Service: General;  Laterality: N/A;   NASAL SEPTOPLASTY W/ TURBINOPLASTY Bilateral 06/21/2015   Procedure: NASAL SEPTOPLASTY WITH BILATERAL TURBINATE REDUCTION;  Surgeon: Alm Bouche, MD;  Location: [facility] OR;  Service: ENT;  Laterality: Bilateral;   SINOSCOPY     TONSILLECTOMY     TUBAL LIGATION     VULVECTOMY N/A 12/24/2021   Procedure: WIDE EXCISION VULVECTOMY;  Surgeon: Viktoria Comer JONELLE, MD;  Location: [facility];  Service: Gynecology;  Laterality: N/A;    Family History  Problem Relation Age of Onset   Allergic rhinitis Father    Allergic rhinitis Sister    Asthma Sister    Endometrial cancer Sister 70   Multiple myeloma Sister    Cancer Maternal Grandmother  Asthma Paternal Aunt    Angioedema Neg Hx    Eczema Neg Hx    Immunodeficiency Neg Hx    Urticaria Neg Hx    Ovarian cancer Neg Hx    Breast cancer Neg Hx    Colon cancer Neg Hx    Prostate cancer Neg Hx    Pancreatic cancer Neg Hx     Social History   Socioeconomic History   Marital status: Married    Spouse name: Not on file    Number of children: Not on file   Years of education: Not on file   Highest education level: Not on file  Occupational History   Occupation: retored  Tobacco Use   Smoking status: Never   Smokeless tobacco: Never  Vaping Use   Vaping status: Never Used  Substance and Sexual Activity   Alcohol use: Yes    Comment: 2 glasses of wine/month   Drug use: No   Sexual activity: Yes    Birth control/protection: Post-menopausal  Other Topics Concern   Not on file  Social History Narrative   Not on file   Social Drivers of Health   Financial Resource Strain: Low Risk  (12/28/2023)   Received from [facility]   Overall Financial Resource Strain (CARDIA)    Difficulty of Paying Living Expenses: Not hard at all  Food Insecurity: No Food Insecurity (12/28/2023)   [facility]   Hunger Vital Sign    Within the past 12 months, you worried that your food would run out before you got the money to buy more.: Never true    Within the past 12 months, the food you bought just didn't last and you didn't have money to get more.: Never true  Transportation Needs: No Transportation Needs (12/28/2023)   Received from [facility] - Transportation    Lack of Transportation (Medical): No    Lack of Transportation (Non-Medical): No  Physical Activity: Not on file  Stress: Not on file  Social Connections: Not on file    Current Medications:  Current Outpatient Medications:    5-Hydroxytryptophan (5-HTP PO), Take 1 tablet by mouth daily., Disp: , Rfl:    albuterol  (VENTOLIN  HFA) 108 (90 Base) MCG/ACT inhaler, Inhale 2 puffs into the lungs every 4 (four) hours as needed for wheezing or shortness of breath., Disp: , Rfl:    amLODipine (NORVASC) 5 MG tablet, Take 5 mg by mouth daily., Disp: , Rfl:    Azelastine  HCl 137 MCG/SPRAY SOLN, USE 2 SPRAYS IN EACH NOSTRIL 2 TIMES A DAY AS DIRECTED, Disp: 30 mL, Rfl: 5   baclofen (LIORESAL) 20 MG tablet, Take 20 mg by mouth See admin  instructions. Take 20 mg daily, may take up to 3 times daily as needed for spasms. Pt takes once a day, Disp: , Rfl:    CINNAMON PO, Take 1,000 mg by mouth in the morning and at bedtime., Disp: , Rfl:    diclofenac Sodium (VOLTAREN) 1 % GEL, Apply 2 g topically 4 (four) times daily as needed (pain)., Disp: , Rfl:    famotidine-calcium  carbonate-magnesium hydroxide (PEPCID COMPLETE) 10-800-165 MG chewable tablet, Chew 1 tablet by mouth every evening., Disp: , Rfl:    fluticasone  (FLONASE ) 50 MCG/ACT nasal spray, Place 1 spray into both nostrils daily., Disp: , Rfl:    fluticasone  (FLOVENT  HFA) 44 MCG/ACT inhaler, INHALE 2 PUFFS INTO THE LUNGS IN THE MORNING AND AT BEDTIME. INHALE 1 PUFF BY MOUTH EVERY DAY, Disp:  10.6 each, Rfl: 5   gabapentin  (NEURONTIN ) 400 MG capsule, Take 400-800 mg by mouth See admin instructions. Take 400 mg in the morning and 800 mg at night, Disp: , Rfl:    GEMTESA 75 MG TABS, 1 tablet Orally Once a day for 30 day(s), Disp: , Rfl:    glucose blood test strip, as directed finger stick once a day for 90 days, Disp: , Rfl:    hydrochlorothiazide (MICROZIDE) 12.5 MG capsule, Take 12.5 mg by mouth daily., Disp: , Rfl:    levocetirizine (XYZAL ) 5 MG tablet, Take 0.5 tablets (2.5 mg total) by mouth every evening., Disp: 90 tablet, Rfl: 1   levothyroxine (SYNTHROID) 112 MCG tablet, Take 112 mcg by mouth daily before breakfast., Disp: , Rfl:    losartan (COZAAR) 100 MG tablet, Take 100 mg by mouth daily., Disp: , Rfl:    Melatonin 3 MG TABS, Take 3 mg by mouth at bedtime., Disp: , Rfl:    meloxicam (MOBIC) 7.5 MG tablet, Take 7.5 mg by mouth 2 (two) times daily as needed for pain., Disp: , Rfl:    Mometasone Furoate  (ASMANEX  HFA) 50 MCG/ACT AERO, 2 puffs twice a day., Disp: 13 g, Rfl: 5   montelukast  (SINGULAIR ) 10 MG tablet, TAKE 1 TABLET BY MOUTH EVERYDAY AT BEDTIME, Disp: 30 tablet, Rfl: 5   MOUNJARO 15 MG/0.5ML Pen, SMARTSIG:15 Milligram(s) SUB-Q Every 4 Weeks, Disp: , Rfl:     Multiple Vitamin (MULTIVITAMIN WITH MINERALS) TABS tablet, Take 1 tablet by mouth at bedtime., Disp: , Rfl:    simvastatin (ZOCOR) 20 MG tablet, Take 20 mg by mouth at bedtime. , Disp: , Rfl:    Turmeric 500 MG CAPS, 1 capsule Orally once a day, Disp: , Rfl:    zolpidem (AMBIEN) 10 MG tablet, Take 5 mg by mouth at bedtime as needed for sleep. , Disp: , Rfl:   Review of Systems: + urinary freq related to overactive bladder Denies appetite changes, fevers, chills, fatigue, unexplained weight changes. Denies hearing loss, neck lumps or masses, mouth sores, ringing in ears or voice changes. Denies cough or wheezing.  Denies shortness of breath. Denies chest pain or palpitations. Denies leg swelling. Denies abdominal distention, pain, blood in stools, constipation, diarrhea, nausea, vomiting, or early satiety. Denies pain with intercourse, dysuria, hematuria or incontinence. Denies hot flashes, pelvic pain, vaginal bleeding or vaginal discharge.   Denies new joint pain. Denies itching, rash, or wounds. Denies dizziness, headaches, numbness or seizures. Denies swollen lymph nodes or glands, denies easy bruising or bleeding. Denies anxiety, depression, confusion, or decreased concentration.  Physical Exam: LMP 07/06/1998 (Approximate)  Today's Vitals   05/18/24 1259  BP: (!) 139/52  Pulse: 65  Resp: 19  Temp: 98.4 F (36.9 C)  TempSrc: Oral  SpO2: 97%  Weight: 243 lb 3.2 oz (110.3 kg)  PainSc: 0-No pain   Body mass index is 40.52 kg/m.  General: Alert, oriented, no acute distress. HEENT: Normocephalic, atraumatic, sclera anicteric. Chest: Clear to auscultation bilaterally.  No wheezes or rhonchi. Cardiovascular: Regular rate and rhythm, no murmurs. Abdomen: Obese, soft, nontender.  Normoactive bowel sounds.  No masses or hepatosplenomegaly appreciated.   Extremities: Grossly normal range of motion.  Warm, well perfused.  No edema bilaterally. Skin: No rashes or lesions  noted. Lymphatics: No palpable cervical, supraclavicular, or inguinal adenopathy. GU: Normal-appearing external female genitalia with some skin changes related to prior excisions and biopsies.  No atypical vascularity or hyperpigmented lesions.  No erythema noted on exam today.  On speculum exam, vaginal mucosa is mildly atrophic, no visible lesions.  Laboratory & Radiologic Studies: 02/08/24: CT C/A/P  MRI abdomen 02/29/24  Assessment & Plan: [name] is a 74 y.o. woman with a history of stage IB vulvar melanoma who underwent repeat excision in an attempt to achieve negative margins given MIS present. Unfortunately, margins still positive for melanoma in situ (11/2021). Patient was treated with Aldara  with post-treatment biopsies (07/2022) showing no definite melanoma in situ or melanoma.  Recent CT in 02/2024 showed stable pulmonary nodules, no evidence of metastatic disease.   She is overall doing well without evidence of disease on exam today.  She will see Dr. Viktoria for follow-up in 3 months with CT imaging (CT CAP) prior to this appointment.  The plan will continue with CT scans every 6 months. Reviewed symptoms that should prompt a phone call before her next scheduled visit.  She will continue to follow with your regular providers and urology. Management of renal lesion per Urology team.   20 minutes of total time was spent for this patient encounter, including preparation, face-to-face counseling with the patient and coordination of care, and documentation of the encounter.  Eleanor Epps NP [facility] GYN Oncology

## 2024-05-18 ENCOUNTER — Inpatient Hospital Stay: Attending: Gynecologic Oncology | Admitting: Gynecologic Oncology

## 2024-05-18 ENCOUNTER — Other Ambulatory Visit: Payer: Self-pay | Admitting: Gynecologic Oncology

## 2024-05-18 VITALS — BP 139/52 | HR 65 | Temp 98.4°F | Resp 19 | Wt 243.2 lb

## 2024-05-18 DIAGNOSIS — R918 Other nonspecific abnormal finding of lung field: Secondary | ICD-10-CM

## 2024-05-18 DIAGNOSIS — C519 Malignant neoplasm of vulva, unspecified: Secondary | ICD-10-CM

## 2024-05-18 DIAGNOSIS — E1142 Type 2 diabetes mellitus with diabetic polyneuropathy: Secondary | ICD-10-CM | POA: Diagnosis not present

## 2024-05-18 DIAGNOSIS — E118 Type 2 diabetes mellitus with unspecified complications: Secondary | ICD-10-CM | POA: Diagnosis not present

## 2024-05-18 DIAGNOSIS — Z85820 Personal history of malignant melanoma of skin: Secondary | ICD-10-CM | POA: Diagnosis not present

## 2024-05-18 DIAGNOSIS — I10 Essential (primary) hypertension: Secondary | ICD-10-CM | POA: Diagnosis not present

## 2024-05-26 DIAGNOSIS — N3281 Overactive bladder: Secondary | ICD-10-CM | POA: Diagnosis not present

## 2024-05-30 DIAGNOSIS — G4733 Obstructive sleep apnea (adult) (pediatric): Secondary | ICD-10-CM | POA: Diagnosis not present

## 2024-06-04 DIAGNOSIS — F3341 Major depressive disorder, recurrent, in partial remission: Secondary | ICD-10-CM | POA: Diagnosis not present

## 2024-06-04 DIAGNOSIS — E039 Hypothyroidism, unspecified: Secondary | ICD-10-CM | POA: Diagnosis not present

## 2024-06-04 DIAGNOSIS — E118 Type 2 diabetes mellitus with unspecified complications: Secondary | ICD-10-CM | POA: Diagnosis not present

## 2024-06-04 DIAGNOSIS — E1142 Type 2 diabetes mellitus with diabetic polyneuropathy: Secondary | ICD-10-CM | POA: Diagnosis not present

## 2024-06-04 DIAGNOSIS — E669 Obesity, unspecified: Secondary | ICD-10-CM | POA: Diagnosis not present

## 2024-06-04 DIAGNOSIS — J4520 Mild intermittent asthma, uncomplicated: Secondary | ICD-10-CM | POA: Diagnosis not present

## 2024-06-04 DIAGNOSIS — I10 Essential (primary) hypertension: Secondary | ICD-10-CM | POA: Diagnosis not present

## 2024-06-06 DIAGNOSIS — M170 Bilateral primary osteoarthritis of knee: Secondary | ICD-10-CM | POA: Diagnosis not present

## 2024-06-08 DIAGNOSIS — N281 Cyst of kidney, acquired: Secondary | ICD-10-CM | POA: Diagnosis not present

## 2024-06-08 DIAGNOSIS — N3281 Overactive bladder: Secondary | ICD-10-CM | POA: Diagnosis not present

## 2024-06-08 DIAGNOSIS — R351 Nocturia: Secondary | ICD-10-CM | POA: Diagnosis not present

## 2024-06-08 DIAGNOSIS — R350 Frequency of micturition: Secondary | ICD-10-CM | POA: Diagnosis not present

## 2024-06-09 DIAGNOSIS — Z86006 Personal history of melanoma in-situ: Secondary | ICD-10-CM | POA: Diagnosis not present

## 2024-06-09 DIAGNOSIS — D49512 Neoplasm of unspecified behavior of left kidney: Secondary | ICD-10-CM | POA: Diagnosis not present

## 2024-07-26 ENCOUNTER — Encounter: Payer: Self-pay | Admitting: Allergy

## 2024-07-26 ENCOUNTER — Ambulatory Visit: Admitting: Allergy

## 2024-07-26 ENCOUNTER — Other Ambulatory Visit: Payer: Self-pay

## 2024-07-26 ENCOUNTER — Other Ambulatory Visit: Payer: Self-pay | Admitting: Allergy

## 2024-07-26 DIAGNOSIS — J4530 Mild persistent asthma, uncomplicated: Secondary | ICD-10-CM

## 2024-07-26 DIAGNOSIS — J310 Chronic rhinitis: Secondary | ICD-10-CM | POA: Diagnosis not present

## 2024-07-26 MED ORDER — ALBUTEROL SULFATE HFA 108 (90 BASE) MCG/ACT IN AERS: 2.0000 | INHALATION_SPRAY | RESPIRATORY_TRACT | 2 refills | Status: AC | PRN

## 2024-07-26 MED ORDER — FLUTICASONE PROPIONATE HFA 44 MCG/ACT IN AERO: 2.0000 | INHALATION_SPRAY | Freq: Two times a day (BID) | RESPIRATORY_TRACT | 5 refills | Status: DC

## 2024-07-26 MED ORDER — MONTELUKAST SODIUM 10 MG PO TABS: ORAL_TABLET | ORAL | 5 refills | Status: AC

## 2024-07-26 MED ORDER — LEVOCETIRIZINE DIHYDROCHLORIDE 5 MG PO TABS: 2.5000 mg | ORAL_TABLET | Freq: Every evening | ORAL | 1 refills | Status: AC

## 2024-07-26 MED ORDER — FLUTICASONE PROPIONATE 50 MCG/ACT NA SUSP: 1.0000 | Freq: Every day | NASAL | 2 refills | Status: AC

## 2024-07-26 MED ORDER — AZELASTINE HCL 137 MCG/SPRAY NA SOLN: NASAL | 5 refills | Status: AC

## 2024-07-26 NOTE — Progress Notes (Signed)
 "   Follow-up Note  RE: [name] MRN: [id number] DOB: Nov 14, 1950 Date of Office Visit: 07/26/2024   History of present illness: [name] is a 74 y.o. female presenting today for follow-up of asthma and mixed rhinitis.  She was last seen in the office on 01/20/2024 by myself. Discussed the use of AI scribe software for clinical note transcription with the patient, who gave verbal consent to proceed. She She has not experienced any respiratory illnesses, colds, flus, COVID, or RSV over the fall and winter. Typically, she gets bronchitis around this time of year but has been well so far, even while being around her two grandsons.  She has been using Asmanex , one puff once a day, for several months and has not needed to use albuterol  in the past six months. She also takes Singulair  and mentions needing a refill. Xyzal  is used at half a tablet daily to manage her symptoms effectively.  Her sinus symptoms are managed with Astelin  at night and over-the-counter Flonase  in the morning. If she forgets to use Flonase , her ears get 'stopped up'.  Her husband had the flu, type A, and she was given Tamiflu prophylactically due to her close exposure and respiratory conditions.  Fortunately never developed any symptoms..       Review of systems: 10pt ROS negative unless noted above in HPI.   Past medical/social/surgical/family history have been reviewed and are unchanged unless specifically indicated below.  No changes  Medication List: Current Outpatient Medications  Medication Sig Dispense Refill   5-Hydroxytryptophan (5-HTP PO) Take 1 tablet by mouth daily.     amLODipine (NORVASC) 5 MG tablet Take 5 mg by mouth daily.     baclofen (LIORESAL) 20 MG tablet Take 20 mg by mouth See admin instructions. Take 20 mg daily, may take up to 3 times daily as needed for spasms. Pt takes once a day     CINNAMON PO Take 1,000 mg by mouth in the morning and at bedtime.     diclofenac Sodium  (VOLTAREN) 1 % GEL Apply 2 g topically 4 (four) times daily as needed (pain).     famotidine-calcium  carbonate-magnesium hydroxide (PEPCID COMPLETE) 10-800-165 MG chewable tablet Chew 1 tablet by mouth every evening.     gabapentin  (NEURONTIN ) 400 MG capsule Take 400-800 mg by mouth See admin instructions. Take 400 mg in the morning and 800 mg at night     glucose blood test strip as directed finger stick once a day for 90 days     hydrochlorothiazide (MICROZIDE) 12.5 MG capsule Take 12.5 mg by mouth daily.     levothyroxine (SYNTHROID) 112 MCG tablet Take 112 mcg by mouth daily before breakfast.     losartan (COZAAR) 100 MG tablet Take 100 mg by mouth daily.     Melatonin 3 MG TABS Take 3 mg by mouth at bedtime.     meloxicam (MOBIC) 7.5 MG tablet Take 7.5 mg by mouth 2 (two) times daily as needed for pain.     Mometasone Furoate  (ASMANEX  HFA) 50 MCG/ACT AERO 2 puffs twice a day. 13 g 5   MOUNJARO 15 MG/0.5ML Pen SMARTSIG:15 Milligram(s) SUB-Q Every 4 Weeks     Multiple Vitamin (MULTIVITAMIN WITH MINERALS) TABS tablet Take 1 tablet by mouth at bedtime.     simvastatin (ZOCOR) 20 MG tablet Take 20 mg by mouth at bedtime.      Turmeric 500 MG CAPS 1 capsule Orally once a day     zolpidem (  AMBIEN) 10 MG tablet Take 5 mg by mouth at bedtime as needed for sleep.      albuterol  (VENTOLIN  HFA) 108 (90 Base) MCG/ACT inhaler Inhale 2 puffs into the lungs every 4 (four) hours as needed for wheezing or shortness of breath. 18 g 2   Azelastine  HCl 137 MCG/SPRAY SOLN USE 2 SPRAYS IN EACH NOSTRIL 2 TIMES A DAY AS DIRECTED 30 mL 5   fluticasone  (FLONASE ) 50 MCG/ACT nasal spray Place 1 spray into both nostrils daily. 16 g 2   fluticasone  (FLOVENT  HFA) 44 MCG/ACT inhaler Inhale 2 puffs into the lungs in the morning and at bedtime. INHALE 1 PUFF BY MOUTH EVERY DAY 10.6 each 5   GEMTESA 75 MG TABS 1 tablet Orally Once a day for 30 day(s)     levocetirizine (XYZAL ) 5 MG tablet Take 0.5 tablets (2.5 mg total) by  mouth every evening. 90 tablet 1   montelukast  (SINGULAIR ) 10 MG tablet TAKE 1 TABLET BY MOUTH EVERYDAY AT BEDTIME 30 tablet 5   No current facility-administered medications for this visit.     Known medication allergies: Allergies[1]   Physical examination: Last menstrual period 07/06/1998.  General: Alert, interactive, in no acute distress. HEENT: PERRLA, TMs pearly gray, turbinates normal, nares with without discharge, post-pharynx non erythematous. Neck: Supple without lymphadenopathy. Lungs: Clear to auscultation without wheezing, rhonchi or rales. {no increased work of breathing. CV: Normal S1, S2 without murmurs. Abdomen: Nondistended, nontender. Skin: Warm and dry, without lesions or rashes. Extremities:  No clubbing, cyanosis or edema. Neuro:   Grossly intact.  Diagnostics/Labs:  Spirometry: FEV1: 2.19L 98%, FVC: 2.6L 90%, ratio consistent with nonobstructive pattern and stable  Assessment and plan:   Asthma  - continue Flovent  44 g 1 puff once a day at this time.  Once you are out of Flovent  change to the Asmanex  1 puff once a day.  - asthma action plan if having asthma flare or respiratory illness: take Flovent  or Asmanex  2 puffs twice a day   - Continue Singulair  10 mg daily  - have access to albuterol  inhaler 2 puffs every 4-6 hours as needed for cough/wheeze/shortness of breath/chest tightness.  May use 15-20 minutes prior to activity.   Monitor frequency of use.    Asthma control goals:  Full participation in all desired activities (may need albuterol  before activity) Albuterol  use two time or less a week on average (not counting use with activity) Cough interfering with sleep two time or less a month Oral steroids no more than once a year No hospitalizations    Mixed rhinitis  - Continue Singulair  as above  - Continue Xyzal  1/2 tab daily.   - Continue Flonase  1-2 sprays each nostril daily as needed for congestion.  Use for 1-2 weeks at a time before  stopping once symptoms improved.   - Continue Astelin  1-2 sprays 1-2 times a day as needed for nasal drainage/post-nasal drip    Follow-up in 6 months or sooner if needed  I appreciate the opportunity to take part in Hodaya's care. Please do not hesitate to contact me with questions.  Sincerely,   Danita Brain, MD Allergy/Immunology [facility]      [1]  Allergies Allergen Reactions   Oxycodone -Acetaminophen  Nausea Only   "

## 2024-07-26 NOTE — Patient Instructions (Addendum)
 Asthma  - continue Flovent  44 g 1 puff once a day at this time.  Once you are out of Flovent  change to the Asmanex  1 puff once a day.  - asthma action plan if having asthma flare or respiratory illness: take Flovent  or Asmanex  2 puffs twice a day   - Continue Singulair  10 mg daily  - have access to albuterol  inhaler 2 puffs every 4-6 hours as needed for cough/wheeze/shortness of breath/chest tightness.  May use 15-20 minutes prior to activity.   Monitor frequency of use.    Asthma control goals:  Full participation in all desired activities (may need albuterol  before activity) Albuterol  use two time or less a week on average (not counting use with activity) Cough interfering with sleep two time or less a month Oral steroids no more than once a year No hospitalizations    Mixed rhinitis  - Continue Singulair  as above  - Continue Xyzal  1/2 tab daily.   - Continue Flonase  1-2 sprays each nostril daily as needed for congestion.  Use for 1-2 weeks at a time before stopping once symptoms improved.   - Continue Astelin  1-2 sprays 1-2 times a day as needed for nasal drainage/post-nasal drip    Follow-up in 6 months or sooner if needed

## 2024-08-07 ENCOUNTER — Ambulatory Visit (HOSPITAL_COMMUNITY)

## 2024-08-11 ENCOUNTER — Ambulatory Visit (HOSPITAL_COMMUNITY): Admission: RE | Admit: 2024-08-11 | Source: Ambulatory Visit

## 2024-08-11 DIAGNOSIS — C519 Malignant neoplasm of vulva, unspecified: Secondary | ICD-10-CM

## 2024-08-11 DIAGNOSIS — R918 Other nonspecific abnormal finding of lung field: Secondary | ICD-10-CM

## 2024-08-11 LAB — POCT I-STAT CREATININE: Creatinine, Ser: 0.8 mg/dL (ref 0.44–1.00)

## 2024-08-11 MED ORDER — IOHEXOL 300 MG/ML  SOLN
100.0000 mL | Freq: Once | INTRAMUSCULAR | Status: AC | PRN
  Administered 2024-08-11: 100 mL via INTRAVENOUS

## 2024-08-11 MED ORDER — IOHEXOL 9 MG/ML PO SOLN
500.0000 mL | ORAL | Status: AC
  Administered 2024-08-11 (×2): 500 mL via ORAL

## 2024-08-17 ENCOUNTER — Inpatient Hospital Stay: Attending: Gynecologic Oncology | Admitting: Gynecologic Oncology

## 2025-01-24 ENCOUNTER — Ambulatory Visit: Admitting: Allergy
